# Patient Record
Sex: Female | Born: 1955 | Race: Black or African American | Hispanic: No | Smoking: Never smoker
Health system: Southern US, Community
[De-identification: ages and names within clinical notes are randomized; demographics above are authoritative.]

## PROBLEM LIST (undated history)

## (undated) VITALS — BP 146/89 | HR 96 | Temp 97.7°F | Resp 20 | Ht 62.0 in | Wt 172.0 lb

## (undated) DIAGNOSIS — K5730 Diverticulosis of large intestine without perforation or abscess without bleeding: Secondary | ICD-10-CM

## (undated) DIAGNOSIS — K589 Irritable bowel syndrome without diarrhea: Secondary | ICD-10-CM

## (undated) DIAGNOSIS — R51 Headache: Secondary | ICD-10-CM

## (undated) DIAGNOSIS — K317 Polyp of stomach and duodenum: Secondary | ICD-10-CM

## (undated) DIAGNOSIS — G20 Parkinson's disease: Secondary | ICD-10-CM

## (undated) DIAGNOSIS — M1990 Unspecified osteoarthritis, unspecified site: Secondary | ICD-10-CM

## (undated) DIAGNOSIS — I10 Essential (primary) hypertension: Secondary | ICD-10-CM

## (undated) DIAGNOSIS — E119 Type 2 diabetes mellitus without complications: Secondary | ICD-10-CM

## (undated) DIAGNOSIS — F419 Anxiety disorder, unspecified: Secondary | ICD-10-CM

## (undated) DIAGNOSIS — F32A Depression, unspecified: Secondary | ICD-10-CM

## (undated) DIAGNOSIS — K259 Gastric ulcer, unspecified as acute or chronic, without hemorrhage or perforation: Secondary | ICD-10-CM

## (undated) DIAGNOSIS — S065X9A Traumatic subdural hemorrhage with loss of consciousness of unspecified duration, initial encounter: Secondary | ICD-10-CM

## (undated) DIAGNOSIS — F410 Panic disorder [episodic paroxysmal anxiety] without agoraphobia: Secondary | ICD-10-CM

## (undated) DIAGNOSIS — K449 Diaphragmatic hernia without obstruction or gangrene: Secondary | ICD-10-CM

## (undated) DIAGNOSIS — G20A1 Parkinson's disease without dyskinesia, without mention of fluctuations: Secondary | ICD-10-CM

## (undated) DIAGNOSIS — G4733 Obstructive sleep apnea (adult) (pediatric): Secondary | ICD-10-CM

## (undated) DIAGNOSIS — F329 Major depressive disorder, single episode, unspecified: Secondary | ICD-10-CM

## (undated) DIAGNOSIS — R519 Headache, unspecified: Secondary | ICD-10-CM

## (undated) DIAGNOSIS — C20 Malignant neoplasm of rectum: Secondary | ICD-10-CM

## (undated) DIAGNOSIS — F319 Bipolar disorder, unspecified: Secondary | ICD-10-CM

## (undated) DIAGNOSIS — S065XAA Traumatic subdural hemorrhage with loss of consciousness status unknown, initial encounter: Secondary | ICD-10-CM

## (undated) DIAGNOSIS — L439 Lichen planus, unspecified: Secondary | ICD-10-CM

## (undated) DIAGNOSIS — D369 Benign neoplasm, unspecified site: Secondary | ICD-10-CM

## (undated) DIAGNOSIS — K649 Unspecified hemorrhoids: Secondary | ICD-10-CM

## (undated) DIAGNOSIS — K219 Gastro-esophageal reflux disease without esophagitis: Secondary | ICD-10-CM

## (undated) DIAGNOSIS — G8929 Other chronic pain: Secondary | ICD-10-CM

## (undated) HISTORY — DX: Essential (primary) hypertension: I10

## (undated) HISTORY — DX: Malignant neoplasm of rectum: C20

## (undated) HISTORY — DX: Gastro-esophageal reflux disease without esophagitis: K21.9

## (undated) HISTORY — DX: Panic disorder (episodic paroxysmal anxiety): F41.0

## (undated) HISTORY — DX: Major depressive disorder, single episode, unspecified: F32.9

## (undated) HISTORY — DX: Diaphragmatic hernia without obstruction or gangrene: K44.9

## (undated) HISTORY — DX: Obstructive sleep apnea (adult) (pediatric): G47.33

## (undated) HISTORY — DX: Headache: R51

## (undated) HISTORY — DX: Type 2 diabetes mellitus without complications: E11.9

## (undated) HISTORY — DX: Diverticulosis of large intestine without perforation or abscess without bleeding: K57.30

## (undated) HISTORY — PX: CHOLECYSTECTOMY: SHX55

## (undated) HISTORY — DX: Polyp of stomach and duodenum: K31.7

## (undated) HISTORY — DX: Headache, unspecified: R51.9

## (undated) HISTORY — DX: Anxiety disorder, unspecified: F41.9

## (undated) HISTORY — DX: Other chronic pain: G89.29

## (undated) HISTORY — DX: Parkinson's disease: G20

## (undated) HISTORY — DX: Parkinson's disease without dyskinesia, without mention of fluctuations: G20.A1

## (undated) HISTORY — PX: BACK SURGERY: SHX140

## (undated) HISTORY — DX: Benign neoplasm, unspecified site: D36.9

## (undated) HISTORY — DX: Unspecified hemorrhoids: K64.9

## (undated) HISTORY — DX: Irritable bowel syndrome, unspecified: K58.9

## (undated) HISTORY — DX: Depression, unspecified: F32.A

## (undated) HISTORY — DX: Lichen planus, unspecified: L43.9

## (undated) HISTORY — DX: Gastric ulcer, unspecified as acute or chronic, without hemorrhage or perforation: K25.9

## (undated) HISTORY — PX: ABDOMINAL HYSTERECTOMY: SHX81

## (undated) HISTORY — PX: APPENDECTOMY: SHX54

## (undated) HISTORY — DX: Bipolar disorder, unspecified: F31.9

---

## 1999-08-31 HISTORY — PX: SPINE SURGERY: SHX786

## 1999-12-23 ENCOUNTER — Encounter: Admission: RE | Admit: 1999-12-23 | Discharge: 1999-12-23 | Payer: Self-pay | Admitting: Emergency Medicine

## 1999-12-23 ENCOUNTER — Encounter: Payer: Self-pay | Admitting: Emergency Medicine

## 2000-01-08 ENCOUNTER — Encounter: Payer: Self-pay | Admitting: Neurosurgery

## 2000-01-12 ENCOUNTER — Encounter: Payer: Self-pay | Admitting: Neurosurgery

## 2000-01-12 ENCOUNTER — Encounter: Payer: Self-pay | Admitting: Orthopedic Surgery

## 2000-01-12 ENCOUNTER — Ambulatory Visit (HOSPITAL_COMMUNITY): Admission: RE | Admit: 2000-01-12 | Discharge: 2000-01-13 | Payer: Self-pay | Admitting: Neurosurgery

## 2000-02-08 ENCOUNTER — Ambulatory Visit (HOSPITAL_BASED_OUTPATIENT_CLINIC_OR_DEPARTMENT_OTHER): Admission: RE | Admit: 2000-02-08 | Discharge: 2000-02-08 | Payer: Self-pay | Admitting: Emergency Medicine

## 2000-02-08 ENCOUNTER — Encounter: Payer: Self-pay | Admitting: Pulmonary Disease

## 2000-08-13 ENCOUNTER — Encounter: Payer: Self-pay | Admitting: Pulmonary Disease

## 2000-08-13 ENCOUNTER — Ambulatory Visit (HOSPITAL_BASED_OUTPATIENT_CLINIC_OR_DEPARTMENT_OTHER): Admission: RE | Admit: 2000-08-13 | Discharge: 2000-08-13 | Payer: Self-pay | Admitting: Pulmonary Disease

## 2001-05-22 ENCOUNTER — Ambulatory Visit (HOSPITAL_COMMUNITY): Admission: RE | Admit: 2001-05-22 | Discharge: 2001-05-22 | Payer: Self-pay | Admitting: Internal Medicine

## 2001-05-22 ENCOUNTER — Encounter: Payer: Self-pay | Admitting: Internal Medicine

## 2001-08-30 DIAGNOSIS — C20 Malignant neoplasm of rectum: Secondary | ICD-10-CM

## 2001-08-30 HISTORY — PX: OTHER SURGICAL HISTORY: SHX169

## 2001-08-30 HISTORY — DX: Malignant neoplasm of rectum: C20

## 2002-04-19 ENCOUNTER — Other Ambulatory Visit: Admission: RE | Admit: 2002-04-19 | Discharge: 2002-04-19 | Payer: Self-pay | Admitting: Obstetrics & Gynecology

## 2002-05-30 HISTORY — PX: ESOPHAGOGASTRODUODENOSCOPY: SHX1529

## 2002-06-07 ENCOUNTER — Emergency Department (HOSPITAL_COMMUNITY): Admission: EM | Admit: 2002-06-07 | Discharge: 2002-06-07 | Payer: Self-pay | Admitting: Emergency Medicine

## 2002-06-13 ENCOUNTER — Emergency Department (HOSPITAL_COMMUNITY): Admission: EM | Admit: 2002-06-13 | Discharge: 2002-06-13 | Payer: Self-pay | Admitting: *Deleted

## 2002-06-25 ENCOUNTER — Ambulatory Visit (HOSPITAL_COMMUNITY): Admission: RE | Admit: 2002-06-25 | Discharge: 2002-06-25 | Payer: Self-pay | Admitting: Internal Medicine

## 2002-07-06 ENCOUNTER — Ambulatory Visit (HOSPITAL_COMMUNITY): Admission: RE | Admit: 2002-07-06 | Discharge: 2002-07-06 | Payer: Self-pay | Admitting: Surgery

## 2002-07-06 ENCOUNTER — Encounter: Payer: Self-pay | Admitting: Surgery

## 2002-07-13 ENCOUNTER — Encounter: Payer: Self-pay | Admitting: Surgery

## 2002-07-13 ENCOUNTER — Ambulatory Visit (HOSPITAL_COMMUNITY): Admission: RE | Admit: 2002-07-13 | Discharge: 2002-07-13 | Payer: Self-pay | Admitting: Surgery

## 2002-07-18 ENCOUNTER — Observation Stay (HOSPITAL_COMMUNITY): Admission: RE | Admit: 2002-07-18 | Discharge: 2002-07-19 | Payer: Self-pay | Admitting: Surgery

## 2002-07-18 ENCOUNTER — Encounter (INDEPENDENT_AMBULATORY_CARE_PROVIDER_SITE_OTHER): Payer: Self-pay | Admitting: Specialist

## 2002-08-07 ENCOUNTER — Ambulatory Visit: Admission: RE | Admit: 2002-08-07 | Discharge: 2002-08-20 | Payer: Self-pay | Admitting: Radiation Oncology

## 2002-08-21 ENCOUNTER — Encounter: Admission: RE | Admit: 2002-08-21 | Discharge: 2002-08-21 | Payer: Self-pay | Admitting: Oncology

## 2002-08-21 ENCOUNTER — Encounter: Payer: Self-pay | Admitting: Oncology

## 2002-10-02 ENCOUNTER — Ambulatory Visit (HOSPITAL_COMMUNITY): Admission: RE | Admit: 2002-10-02 | Discharge: 2002-10-02 | Payer: Self-pay | Admitting: Internal Medicine

## 2003-12-09 ENCOUNTER — Ambulatory Visit (HOSPITAL_COMMUNITY): Admission: RE | Admit: 2003-12-09 | Discharge: 2003-12-09 | Payer: Self-pay | Admitting: Internal Medicine

## 2004-03-19 ENCOUNTER — Ambulatory Visit (HOSPITAL_COMMUNITY): Admission: RE | Admit: 2004-03-19 | Discharge: 2004-03-19 | Payer: Self-pay | Admitting: Internal Medicine

## 2004-03-30 ENCOUNTER — Ambulatory Visit (HOSPITAL_COMMUNITY): Admission: RE | Admit: 2004-03-30 | Discharge: 2004-03-31 | Payer: Self-pay | Admitting: Neurosurgery

## 2004-04-13 ENCOUNTER — Encounter: Admission: RE | Admit: 2004-04-13 | Discharge: 2004-04-16 | Payer: Self-pay | Admitting: Neurosurgery

## 2004-07-16 ENCOUNTER — Encounter: Admission: RE | Admit: 2004-07-16 | Discharge: 2004-07-16 | Payer: Self-pay | Admitting: Neurosurgery

## 2004-11-26 ENCOUNTER — Ambulatory Visit: Payer: Self-pay | Admitting: Psychiatry

## 2005-05-04 ENCOUNTER — Ambulatory Visit: Payer: Self-pay | Admitting: Psychiatry

## 2005-08-25 ENCOUNTER — Ambulatory Visit (HOSPITAL_COMMUNITY): Admission: RE | Admit: 2005-08-25 | Discharge: 2005-08-25 | Payer: Self-pay | Admitting: Internal Medicine

## 2005-10-21 ENCOUNTER — Ambulatory Visit: Payer: Self-pay | Admitting: Internal Medicine

## 2005-10-27 ENCOUNTER — Ambulatory Visit (HOSPITAL_COMMUNITY): Admission: RE | Admit: 2005-10-27 | Discharge: 2005-10-27 | Payer: Self-pay | Admitting: Internal Medicine

## 2005-10-27 ENCOUNTER — Ambulatory Visit: Payer: Self-pay | Admitting: Internal Medicine

## 2005-10-27 HISTORY — PX: COLONOSCOPY: SHX174

## 2005-12-14 ENCOUNTER — Ambulatory Visit (HOSPITAL_COMMUNITY): Payer: Self-pay | Admitting: Psychiatry

## 2006-03-10 ENCOUNTER — Ambulatory Visit (HOSPITAL_COMMUNITY): Payer: Self-pay | Admitting: Psychiatry

## 2006-06-07 ENCOUNTER — Ambulatory Visit (HOSPITAL_COMMUNITY): Payer: Self-pay | Admitting: Psychiatry

## 2007-08-31 HISTORY — PX: COLONOSCOPY: SHX174

## 2007-09-20 ENCOUNTER — Ambulatory Visit: Payer: Self-pay | Admitting: Internal Medicine

## 2007-09-26 ENCOUNTER — Ambulatory Visit (HOSPITAL_COMMUNITY): Admission: RE | Admit: 2007-09-26 | Discharge: 2007-09-26 | Payer: Self-pay | Admitting: Internal Medicine

## 2007-09-26 ENCOUNTER — Ambulatory Visit: Payer: Self-pay | Admitting: Internal Medicine

## 2007-10-23 ENCOUNTER — Ambulatory Visit: Payer: Self-pay | Admitting: Pulmonary Disease

## 2007-10-23 DIAGNOSIS — R51 Headache: Secondary | ICD-10-CM | POA: Insufficient documentation

## 2007-10-23 DIAGNOSIS — R519 Headache, unspecified: Secondary | ICD-10-CM | POA: Insufficient documentation

## 2007-10-23 DIAGNOSIS — F410 Panic disorder [episodic paroxysmal anxiety] without agoraphobia: Secondary | ICD-10-CM | POA: Insufficient documentation

## 2007-10-23 DIAGNOSIS — J309 Allergic rhinitis, unspecified: Secondary | ICD-10-CM | POA: Insufficient documentation

## 2007-10-23 DIAGNOSIS — N183 Chronic kidney disease, stage 3 unspecified: Secondary | ICD-10-CM | POA: Insufficient documentation

## 2007-10-23 DIAGNOSIS — C20 Malignant neoplasm of rectum: Secondary | ICD-10-CM | POA: Insufficient documentation

## 2007-10-23 DIAGNOSIS — F329 Major depressive disorder, single episode, unspecified: Secondary | ICD-10-CM | POA: Insufficient documentation

## 2007-10-23 DIAGNOSIS — E785 Hyperlipidemia, unspecified: Secondary | ICD-10-CM | POA: Insufficient documentation

## 2007-10-23 DIAGNOSIS — F411 Generalized anxiety disorder: Secondary | ICD-10-CM | POA: Insufficient documentation

## 2007-10-23 DIAGNOSIS — G4733 Obstructive sleep apnea (adult) (pediatric): Secondary | ICD-10-CM | POA: Insufficient documentation

## 2007-10-23 DIAGNOSIS — K589 Irritable bowel syndrome without diarrhea: Secondary | ICD-10-CM | POA: Insufficient documentation

## 2007-10-23 DIAGNOSIS — E782 Mixed hyperlipidemia: Secondary | ICD-10-CM | POA: Insufficient documentation

## 2007-10-23 DIAGNOSIS — E119 Type 2 diabetes mellitus without complications: Secondary | ICD-10-CM | POA: Insufficient documentation

## 2007-10-23 DIAGNOSIS — I10 Essential (primary) hypertension: Secondary | ICD-10-CM | POA: Insufficient documentation

## 2007-11-29 ENCOUNTER — Encounter: Payer: Self-pay | Admitting: Pulmonary Disease

## 2007-12-01 ENCOUNTER — Telehealth (INDEPENDENT_AMBULATORY_CARE_PROVIDER_SITE_OTHER): Payer: Self-pay | Admitting: *Deleted

## 2007-12-12 ENCOUNTER — Telehealth (INDEPENDENT_AMBULATORY_CARE_PROVIDER_SITE_OTHER): Payer: Self-pay | Admitting: *Deleted

## 2010-06-26 LAB — COMPREHENSIVE METABOLIC PANEL
AST: 17 U/L
BUN: 13 mg/dL (ref 4–21)
Creat: 0.87
Potassium: 4.6 mmol/L
Sodium: 143 mmol/L (ref 137–147)

## 2010-06-26 LAB — HEMOGLOBIN A1C: Hgb A1c MFr Bld: 6.5 % — AB (ref 4.0–6.0)

## 2010-08-04 ENCOUNTER — Encounter: Payer: Self-pay | Admitting: Orthopedic Surgery

## 2010-08-05 ENCOUNTER — Ambulatory Visit: Payer: Self-pay | Admitting: Orthopedic Surgery

## 2010-08-05 DIAGNOSIS — M2140 Flat foot [pes planus] (acquired), unspecified foot: Secondary | ICD-10-CM | POA: Insufficient documentation

## 2010-08-05 DIAGNOSIS — IMO0002 Reserved for concepts with insufficient information to code with codable children: Secondary | ICD-10-CM | POA: Insufficient documentation

## 2010-08-05 DIAGNOSIS — M1710 Unilateral primary osteoarthritis, unspecified knee: Secondary | ICD-10-CM | POA: Insufficient documentation

## 2010-08-20 ENCOUNTER — Encounter (HOSPITAL_COMMUNITY)
Admission: RE | Admit: 2010-08-20 | Discharge: 2010-09-19 | Payer: Self-pay | Source: Home / Self Care | Attending: Orthopedic Surgery | Admitting: Orthopedic Surgery

## 2010-09-17 ENCOUNTER — Encounter: Payer: Self-pay | Admitting: Orthopedic Surgery

## 2010-09-21 ENCOUNTER — Encounter (HOSPITAL_COMMUNITY)
Admission: RE | Admit: 2010-09-21 | Discharge: 2010-09-29 | Payer: Self-pay | Source: Home / Self Care | Attending: Orthopedic Surgery | Admitting: Orthopedic Surgery

## 2010-09-29 NOTE — Letter (Signed)
Summary: History form  History form   Imported By: Jacklynn Ganong 08/07/2010 09:26:47  _____________________________________________________________________  External Attachment:    Type:   Image     Comment:   External Document

## 2010-09-29 NOTE — Assessment & Plan Note (Signed)
Summary: bi knee pain needs xr/bcbs/fagan/bsf   Vital Signs:  Patient profile:   55 year old female Height:      63 inches Weight:      207 pounds Pulse rate:   80 / minute Resp:     18 per minute  Vitals Entered By: Fuller Canada MD (August 05, 2010 9:19 AM)  Visit Type:  new patient Referring Provider:  Carylon Perches Primary Provider:  Carylon Perches  CC:  bilateral knee pain.Marland Kitchen  History of Present Illness: I saw Linda English in the office today for an initial visit.  She is a 55 years old woman with the complaint of:  bilateral knee pain. So, Has LEFT Ankle Pain.  Patient Has Bilateral Knee Pain, RIGHT Greater Than LEFT for Approximately 6 Years. It Appears to Be Worse When She Is Negotiating Stairs with Posterior Patellar Pain. She Also Complains of Stiffness Catching, and Weakness, Especially When Climbing Stairs. Swelling Has Not Been a Major Symptom.  LEFT Ankle Has Been Hurting for Approximately 5 Years.  Her Pain Is Unrelieved by Advil 400 Mg.  No injury.  Xrays today.  Meds: Prilosec, Amlodipine, Klonipin, Cymbalta, Buspar, Abilify, Folic acid, Metformin, Glipizide, Byetta, Methotrexate.      Allergies (verified): No Known Drug Allergies  Past History:  Past Surgical History: status post spine surgery x 2 back and neck/FYI L5, S1 hemilaminotomy and discectomy 2001 Dr. Elesa Hacker. status post cholecystectomy and hysterectomy status post colon surgery for rectal cancer 2 c sections Acid refux surgery  Family History: allergies: mother asthma: mother cancer: paternal grandfather (colon) Family History of Diabetes Family History Coronary Heart Disease female < 56  Social History: Patient never smoked.  pt is married. pt has children. Patient is divorced.  retired Runner, broadcasting/film/video no alcohol caffeine limited use daily masters degree  Review of Systems Constitutional:  Denies weight loss, weight gain, fever, chills, and fatigue. Cardiovascular:  Denies  chest pain, palpitations, fainting, and murmurs. Respiratory:  Complains of snoring; denies short of breath, wheezing, couch, tightness, pain on inspiration, and snoring . Gastrointestinal:  Complains of heartburn, nausea, diarrhea, and constipation; denies vomiting and blood in your stools. Genitourinary:  Complains of urgency; denies frequency, difficulty urinating, painful urination, flank pain, and bleeding in urine. Neurologic:  Complains of numbness; denies tingling, unsteady gait, dizziness, tremors, and seizure. Musculoskeletal:  Complains of joint pain; denies swelling, instability, stiffness, redness, heat, and muscle pain. Endocrine:  Complains of excessive thirst, exessive urination, and heat or cold intolerance. Psychiatric:  Complains of nervousness, depression, and anxiety; denies hallucinations. Skin:  Complains of changes in the skin and itching; denies poor healing, rash, and redness. HEENT:  Complains of blurred or double vision; denies eye pain, redness, and watering. Immunology:  Complains of seasonal allergies; denies sinus problems and allergic to bee stings. Hemoatologic:  Denies easy bleeding and brusing.  Physical Exam  Additional Exam:   * VS reviewed and were normal   *GEN: appearance was normal,endomorph body habitus.  ** CDV: normal pulses temperature and no edema  * LYMPH nodes were normal   * SKIN was normal   * Neuro: normal sensation ** Psyche: AAO x 3 and mood was normal   MSK *Gait was normal   LEFT ankle. Tenderness over the posterior tibial tendon with a flexible flatfoot.  Ankle range of motion is otherwise, normal and stable.  Bilateral knee examination reveals patellofemoral crepitus with range of motion, greater LEFT than RIGHT. Joint lines nontender. Range of motion full.  Strength and muscle tone normal. Knee stable.  Hip range of motion normal and unreactive.      Impression & Recommendations:  Problem # 1:  OSTEOARTHRITIS, KNEE  (ICD-715.96) Assessment New AP lateral, and patellofemoral x-rays both knees.  Bilateral patellofemoral arthritis is seen bilaterally.  There is also peaking of the tibial spines on both tibiofemoral x-rays.  Overall impression osteoarthritis, patellofemoral joint, mild tibiofemoral arthritis.  Recommendations are for arthritis medication. Physical therapy. Weight loss. Orthotics for the flexible flatfoot. Her updated medication list for this problem includes:     Orders: Physical Therapy Referral (PT) New Patient Level III (40981) Knees  x-ray bilateral ([id number])  Problem # 2:  PES PLANUS (ICD-734) Assessment: New  Orders: New Patient Level III (56213)  Patient Instructions: 1)  Please schedule a follow-up appointment in 6 months.   Orders Added: 1)  Physical Therapy Referral [PT] 2)  New Patient Level III [08657] 3)  Knees  x-ray bilateral [[id number]]

## 2010-09-30 ENCOUNTER — Ambulatory Visit (HOSPITAL_COMMUNITY): Payer: Self-pay

## 2010-10-02 ENCOUNTER — Encounter (HOSPITAL_COMMUNITY)
Admission: RE | Admit: 2010-10-02 | Discharge: 2010-10-02 | Disposition: A | Discharge status: Home / Self Care | Payer: BC Managed Care – PPO | Source: Ambulatory Visit | Attending: Orthopedic Surgery | Admitting: Orthopedic Surgery

## 2010-10-02 DIAGNOSIS — R262 Difficulty in walking, not elsewhere classified: Secondary | ICD-10-CM | POA: Insufficient documentation

## 2010-10-02 DIAGNOSIS — Z85038 Personal history of other malignant neoplasm of large intestine: Secondary | ICD-10-CM | POA: Insufficient documentation

## 2010-10-02 DIAGNOSIS — M6281 Muscle weakness (generalized): Secondary | ICD-10-CM | POA: Insufficient documentation

## 2010-10-02 DIAGNOSIS — M1710 Unilateral primary osteoarthritis, unspecified knee: Secondary | ICD-10-CM | POA: Insufficient documentation

## 2010-10-02 DIAGNOSIS — IMO0001 Reserved for inherently not codable concepts without codable children: Secondary | ICD-10-CM | POA: Insufficient documentation

## 2010-10-05 ENCOUNTER — Ambulatory Visit (HOSPITAL_COMMUNITY)
Admission: RE | Admit: 2010-10-05 | Discharge: 2010-10-05 | Disposition: A | Discharge status: Home / Self Care | Payer: BC Managed Care – PPO | Source: Ambulatory Visit | Attending: Orthopedic Surgery | Admitting: Orthopedic Surgery

## 2010-10-05 DIAGNOSIS — M25569 Pain in unspecified knee: Secondary | ICD-10-CM | POA: Insufficient documentation

## 2010-10-05 DIAGNOSIS — M6281 Muscle weakness (generalized): Secondary | ICD-10-CM | POA: Insufficient documentation

## 2010-10-05 DIAGNOSIS — I10 Essential (primary) hypertension: Secondary | ICD-10-CM | POA: Insufficient documentation

## 2010-10-05 DIAGNOSIS — R269 Unspecified abnormalities of gait and mobility: Secondary | ICD-10-CM | POA: Insufficient documentation

## 2010-10-05 DIAGNOSIS — R262 Difficulty in walking, not elsewhere classified: Secondary | ICD-10-CM | POA: Insufficient documentation

## 2010-10-05 DIAGNOSIS — IMO0001 Reserved for inherently not codable concepts without codable children: Secondary | ICD-10-CM | POA: Insufficient documentation

## 2010-10-05 DIAGNOSIS — E119 Type 2 diabetes mellitus without complications: Secondary | ICD-10-CM | POA: Insufficient documentation

## 2010-10-07 ENCOUNTER — Ambulatory Visit (HOSPITAL_COMMUNITY)
Admission: RE | Admit: 2010-10-07 | Discharge: 2010-10-07 | Disposition: A | Discharge status: Home / Self Care | Payer: BC Managed Care – PPO | Source: Ambulatory Visit | Attending: Orthopedic Surgery | Admitting: Orthopedic Surgery

## 2010-10-07 DIAGNOSIS — I10 Essential (primary) hypertension: Secondary | ICD-10-CM | POA: Insufficient documentation

## 2010-10-07 DIAGNOSIS — M25669 Stiffness of unspecified knee, not elsewhere classified: Secondary | ICD-10-CM | POA: Insufficient documentation

## 2010-10-07 DIAGNOSIS — IMO0001 Reserved for inherently not codable concepts without codable children: Secondary | ICD-10-CM | POA: Insufficient documentation

## 2010-10-07 DIAGNOSIS — M25569 Pain in unspecified knee: Secondary | ICD-10-CM | POA: Insufficient documentation

## 2010-10-07 DIAGNOSIS — E119 Type 2 diabetes mellitus without complications: Secondary | ICD-10-CM | POA: Insufficient documentation

## 2010-10-07 DIAGNOSIS — M6281 Muscle weakness (generalized): Secondary | ICD-10-CM | POA: Insufficient documentation

## 2010-10-07 DIAGNOSIS — R269 Unspecified abnormalities of gait and mobility: Secondary | ICD-10-CM | POA: Insufficient documentation

## 2010-10-07 NOTE — Miscellaneous (Signed)
Summary: PT progress note  PT progress note   Imported By: Jacklynn Ganong 09/28/2010 13:29:25  _____________________________________________________________________  External Attachment:    Type:   Image     Comment:   External Document

## 2010-10-08 ENCOUNTER — Encounter: Payer: Self-pay | Admitting: Orthopedic Surgery

## 2010-10-09 ENCOUNTER — Ambulatory Visit (HOSPITAL_COMMUNITY)
Admission: RE | Admit: 2010-10-09 | Discharge: 2010-10-09 | Disposition: A | Discharge status: Home / Self Care | Payer: BC Managed Care – PPO | Source: Ambulatory Visit | Attending: Orthopedic Surgery | Admitting: Orthopedic Surgery

## 2010-10-09 DIAGNOSIS — IMO0001 Reserved for inherently not codable concepts without codable children: Secondary | ICD-10-CM | POA: Insufficient documentation

## 2010-10-09 DIAGNOSIS — E119 Type 2 diabetes mellitus without complications: Secondary | ICD-10-CM | POA: Insufficient documentation

## 2010-10-09 DIAGNOSIS — M25669 Stiffness of unspecified knee, not elsewhere classified: Secondary | ICD-10-CM | POA: Insufficient documentation

## 2010-10-09 DIAGNOSIS — R262 Difficulty in walking, not elsewhere classified: Secondary | ICD-10-CM | POA: Insufficient documentation

## 2010-10-09 DIAGNOSIS — I10 Essential (primary) hypertension: Secondary | ICD-10-CM | POA: Insufficient documentation

## 2010-10-09 DIAGNOSIS — M25569 Pain in unspecified knee: Secondary | ICD-10-CM | POA: Insufficient documentation

## 2010-10-12 ENCOUNTER — Encounter (HOSPITAL_COMMUNITY)
Admission: RE | Admit: 2010-10-12 | Discharge: 2010-10-12 | Disposition: A | Discharge status: Home / Self Care | Payer: BC Managed Care – PPO | Source: Ambulatory Visit

## 2010-10-14 ENCOUNTER — Ambulatory Visit (HOSPITAL_COMMUNITY)
Admission: RE | Admit: 2010-10-14 | Discharge: 2010-10-14 | Disposition: A | Discharge status: Home / Self Care | Payer: BC Managed Care – PPO | Source: Ambulatory Visit | Attending: Orthopedic Surgery | Admitting: Orthopedic Surgery

## 2010-10-16 ENCOUNTER — Ambulatory Visit (HOSPITAL_COMMUNITY): Payer: BC Managed Care – PPO

## 2010-10-21 NOTE — Miscellaneous (Signed)
Summary: PT discharge summary  PT discharge summary   Imported By: Jacklynn Ganong 10/15/2010 14:23:19  _____________________________________________________________________  External Attachment:    Type:   Image     Comment:   External Document

## 2010-10-22 ENCOUNTER — Encounter (INDEPENDENT_AMBULATORY_CARE_PROVIDER_SITE_OTHER): Payer: Self-pay | Admitting: *Deleted

## 2010-10-27 NOTE — Letter (Signed)
Summary: Recall, Screening Colonoscopy Only  [facility]  [address]   [place], [place] [address]   Phone: [phone]  Fax: [phone]    October 22, 2010  [facility] [address] [place], [place]  [address] 02-19-1956   Dear Ms. Frommer,   Our records indicate it is time to schedule your colonoscopy.   Please call our office at [phone] and ask for the nurse.   Thank you, Stene Limes, LPN Cloria Spring, LPN  [facility] Ph: [phone]   Fax: [phone]

## 2010-11-27 ENCOUNTER — Encounter: Payer: Self-pay | Admitting: Gastroenterology

## 2010-11-30 ENCOUNTER — Encounter: Payer: Self-pay | Admitting: Gastroenterology

## 2010-11-30 ENCOUNTER — Ambulatory Visit (INDEPENDENT_AMBULATORY_CARE_PROVIDER_SITE_OTHER): Payer: BC Managed Care – PPO | Admitting: Gastroenterology

## 2010-11-30 VITALS — BP 134/90 | HR 90 | Temp 97.5°F | Ht 61.0 in | Wt 207.0 lb

## 2010-11-30 DIAGNOSIS — R194 Change in bowel habit: Secondary | ICD-10-CM

## 2010-11-30 DIAGNOSIS — R198 Other specified symptoms and signs involving the digestive system and abdomen: Secondary | ICD-10-CM | POA: Insufficient documentation

## 2010-11-30 DIAGNOSIS — Z800 Family history of malignant neoplasm of digestive organs: Secondary | ICD-10-CM | POA: Insufficient documentation

## 2010-11-30 DIAGNOSIS — C20 Malignant neoplasm of rectum: Secondary | ICD-10-CM

## 2010-11-30 NOTE — Assessment & Plan Note (Signed)
History of rectal cancer in 2004. Family history of colon cancer paternal grandfather. Last colonoscopy 3 years ago.

## 2010-11-30 NOTE — Progress Notes (Signed)
Primary Care Physician:  Cranford Mon  Chief Complaint  Patient presents with  . Pre-op Exam    colonoscopy    HPI:  Linda English is a 55 y.o. female here for consideration of colonoscopy. Her last colonoscopy was in January 2009. She has a history of low anterior resection for rectal carcinoma back in 2004. At time of her last colonoscopy she had a surgical anastomosis at 3 cm, very distal residual rectum was normal. Distal scattered diverticula, friable anal canal hemorrhoids. She basically has been having surveillance colonoscopies every 3 years at this point. She states in the last few months she has had a change her bowel habits. Several months started having increased constipation. H/O IBS but before more diarrhea. Recently started to have to take laxatives. MOM. No known rectal bleeding or melena. No new abd pain, some LLQ tenderness. No weight loss, anorexia. Heartburn well-controlled, no dysphagia.   Current Outpatient Prescriptions  Medication Sig Dispense Refill  . ABILIFY 2 MG tablet Take 1 tablet by mouth daily.      Marland Kitchen amLODipine-benazepril (LOTREL) 5-20 MG per capsule Take 1 capsule by mouth daily.        Marland Kitchen buPROPion (WELLBUTRIN XL) 150 MG 24 hr tablet Take 1 tablet by mouth daily.      Marland Kitchen BYETTA 10 MCG PEN 10 MCG/0.04ML SOLN Inject 10 mcg into the skin 2 (two) times daily.       . clonazePAM (KLONOPIN) 1 MG tablet Take 1 tablet by mouth Every 6 hours as needed.      . CYMBALTA 60 MG capsule Take 1 tablet by mouth daily.      . folic acid (FOLVITE) 1 MG tablet Take 1 tablet by mouth daily.      Marland Kitchen glipiZIDE (GLUCOTROL) 10 MG 24 hr tablet Take 1 tablet by mouth daily.      . metFORMIN (GLUCOPHAGE-XR) 500 MG 24 hr tablet Take 1,000 mg by mouth daily.       . methotrexate (RHEUMATREX) 2.5 MG tablet Take 3 tablets by mouth Once a week. Wednesday      . mirtazapine (REMERON) 15 MG tablet Take 1 tablet by mouth daily.      . ONE TOUCH ULTRA TEST test strip 1 strip by Does not  apply route Once daily as needed.      Marland Kitchen PRILOSEC OTC 20 MG tablet Take 1 tablet by mouth daily.      .         .        . aspirin 81 MG tablet Take 81 mg by mouth daily.        Marland Kitchen Hyoscyamine Sulfate (HYOMAX-DT) 0.375 MG TBCR Take by mouth. As directed       .         Marland Kitchen          Allergies as of 11/30/2010  . (No Known Allergies)    Past Medical History  Diagnosis Date  . Anxiety   . Allergic rhinitis   . Acid reflux   . IBS (irritable bowel syndrome)   . Depression   . Panic disorder   . Chronic headache   . Rectal cancer   . DM (diabetes mellitus)   . Hypertension   . Obstructive sleep apnea   . Lichen planus     Past Surgical History  Procedure Date  . Spine surgery 2001    L5,S1 HEMILAMINOTOMY AND DISCECTOMY/DR DEATON  . Cholecystectomy   . Low anterior  rection 2004    RECTAL CANCER  . Cesarean section     X  2  . Appendectomy   . Colonoscopy 08/2007    DR Jena Gauss, friable anal canal hemorrhoids, surgical anastomosis at 3cm, distal scattered tics  . Esophagogastroduodenoscopy 05/2002    DR Jena Gauss    Family History  Problem Relation Age of Onset  . Colon cancer Paternal Grandfather 29  . Colon polyps Brother   . Colon polyps Cousin     History   Social History  . Marital Status: Divorced    Spouse Name: N/A    Number of Children: N/A  . Years of Education: N/A   Occupational History  . teacher     [employer]   Social History Main Topics  . Smoking status: Never Smoker   . Smokeless tobacco: Never Used  . Alcohol Use: No  . Drug Use: No  . Sexually Active: No   Other Topics Concern  . Not on file   Social History Narrative  . No narrative on file      ROS:  General: Negative for anorexia, weight loss, fever, chills, fatigue, weakness. Eyes: Negative for vision changes.  ENT: Negative for hoarseness, difficulty swallowing , nasal congestion. CV: Negative for chest pain, angina, palpitations, dyspnea on exertion, peripheral edema.    Respiratory: Negative for dyspnea at rest, dyspnea on exertion, cough, sputum, wheezing.  GI: See history of present illness. GU:  Negative for dysuria, hematuria, urinary incontinence, urinary frequency, nocturnal urination.  MS: Negative for joint pain, low back pain.  Derm: Negative for rash or itching.  Neuro: Negative for weakness, abnormal sensation, seizure, frequent headaches, memory loss, confusion.  Psych: Positive for anxiety. No depression, suicidal ideation, hallucinations.  Endo: Negative for unusual weight change.  Heme: Negative for bruising or bleeding. Allergy: Negative for rash or hives.    Physical Examination:  BP 134/90  Pulse 90  Temp 97.5 F (36.4 C)  Ht 5\' 1"  (1.549 m)  Wt 207 lb (93.895 kg)  BMI 39.11 kg/m2  SpO2 99%   General: Well-nourished, well-developed in no acute distress.  Head: Normocephalic, atraumatic.   Eyes: Conjunctiva pink, no icterus. Mouth: Oropharyngeal mucosa moist and pink , no lesions erythema or exudate. Neck: Supple without thyromegaly, masses, or lymphadenopathy.  Lungs: Clear to auscultation bilaterally.  Heart: Regular rate and rhythm, no murmurs rubs or gallops.  Abdomen: Bowel sounds are normal, nontender, nondistended, no hepatosplenomegaly or masses, no abdominal bruits or    hernia , no rebound or guarding.   Extremities: No lower extremity edema.  Neuro: Alert and oriented x 4 , grossly normal neurologically.  Skin: Warm and dry, no rash or jaundice.   Psych: Alert and cooperative, normal mood and affect.

## 2010-11-30 NOTE — Assessment & Plan Note (Signed)
See below

## 2010-11-30 NOTE — Assessment & Plan Note (Signed)
Several month history of change in bowel habits. Chronic irritable bowel syndrome with predominance of diarrhea. Now with more constipation requiring laxative use. Given her history of prior rectal cancer and change in bowels we'll go ahead and perform colonoscopy at this time. I have discussed the risks, alternatives, benefits with regards to but not limited to the risk of reaction to medication, bleeding, infection, perforation and the patient is agreeable to proceed. Written consent to be obtained.

## 2010-12-01 NOTE — Progress Notes (Signed)
Reviewed by R. Michael Jaivian Battaglini, MD FACP FACG 

## 2010-12-15 ENCOUNTER — Encounter: Payer: BC Managed Care – PPO | Admitting: Internal Medicine

## 2010-12-15 ENCOUNTER — Ambulatory Visit (HOSPITAL_COMMUNITY)
Admission: RE | Admit: 2010-12-15 | Discharge: 2010-12-15 | Disposition: A | Discharge status: Home / Self Care | Payer: BC Managed Care – PPO | Source: Ambulatory Visit | Attending: Internal Medicine | Admitting: Internal Medicine

## 2010-12-15 ENCOUNTER — Other Ambulatory Visit: Payer: Self-pay | Admitting: Internal Medicine

## 2010-12-15 DIAGNOSIS — K5909 Other constipation: Secondary | ICD-10-CM | POA: Insufficient documentation

## 2010-12-15 DIAGNOSIS — K5730 Diverticulosis of large intestine without perforation or abscess without bleeding: Secondary | ICD-10-CM

## 2010-12-15 DIAGNOSIS — K648 Other hemorrhoids: Secondary | ICD-10-CM | POA: Insufficient documentation

## 2010-12-15 DIAGNOSIS — Z85038 Personal history of other malignant neoplasm of large intestine: Secondary | ICD-10-CM | POA: Insufficient documentation

## 2010-12-15 DIAGNOSIS — D126 Benign neoplasm of colon, unspecified: Secondary | ICD-10-CM | POA: Insufficient documentation

## 2010-12-15 DIAGNOSIS — K5900 Constipation, unspecified: Secondary | ICD-10-CM

## 2010-12-15 DIAGNOSIS — Z9049 Acquired absence of other specified parts of digestive tract: Secondary | ICD-10-CM | POA: Insufficient documentation

## 2010-12-15 HISTORY — PX: COLONOSCOPY: SHX174

## 2010-12-20 NOTE — Op Note (Signed)
  NAME:  Linda English, Linda English        ACCOUNT NO.:  [id number]  MEDICAL RECORD NO.:  [id number]           PATIENT TYPE:  O  LOCATION:  DAYP                          FACILITY:  [facility]  PHYSICIAN:  R. Roetta Sessions, M.D. DATE OF BIRTH:  1955/10/06  DATE OF PROCEDURE:  12/15/2010 DATE OF DISCHARGE:                              OPERATIVE REPORT   PROCEDURE:  Colonoscopy with biopsy.  INDICATIONS FOR PROCEDURE:  A 55 year old lady with a history of low anterior resection for colorectal cancer, somewhat distantly now with some change in bowel habits consistent with constipation.  Last colonoscopy was in 2009, surgical anastomosis low at 3 cm.  She had some distal diverticula was friable hemorrhoids.  Colonoscopy is now being done.  Risks, benefits, limitations, alternatives, imponderables have been discussed, questions answered.  Please see the documentation in the medical record.  PROCEDURE NOTE:  O2 saturation, blood pressure, pulse, respirations monitored throughout the entire procedure.  CONSCIOUS SEDATION:  Versed 4 mg IV, Demerol 100 mg IV in divided doses.  INSTRUMENT:  Pentax video chip system.  FINDINGS:  Digital rectal exam revealed no abnormalities.  Endoscopic findings:  Prep was suboptimal, but doable.  The anastomosis with the distal rectum was inapparent, it was very good healing.  The residual rectum and distal colon appeared normal where the anastomosis was noted to have been located previously aside from anal papilla and internal hemorrhoids seen on retroflexion.  There were distal colonic diverticula.  It tapered off more proximally.  The scope was easily advanced to the cecum. Cecum, ileocecal valve, appendiceal orifice were well seen photographed for the record.  From this level, scope slowly and cautiously withdrawn.  All previously mentioned mucosal surfaces were again seen.  The patient has single diminutive polyp at the base of cecum which was cold  biopsied/removed and the distal diverticula as outlined above.  The remainder of the residual colonic mucosa and rectum appeared normal, aside from hemorrhoids and anal papilla.  The patient tolerated the procedure well.  Cecal withdrawal time 14 minutes.  IMPRESSION:  Anal papilla and internal hemorrhoids.  Residual rectum and colon felt to been seen adequately in the distal colonic diverticula, diminutive polyp in the base of the cecum status post cold biopsy removal.  RECOMMENDATIONS: 1. Diverticulosis, constipation literature provided to Ms. Cooksey. 2. Daily fiber supplement such as Benefiber 1 tablespoon. 3. MiraLax 17 g orally at bedtime p.r.n. constipation. 4. Follow up on path. 5. Further recommendations to follow.     Jonathon Bellows, M.D.     RMR/MEDQ  D:  12/15/2010  T:  12/15/2010  Job:  [id number]  cc:   Kingsley Callander. Ouida Sills, MD Fax: [phone]  Electronically Signed by Lorrin Goodell M.D. on 12/20/2010 02:08:05 PM

## 2010-12-24 ENCOUNTER — Encounter: Payer: Self-pay | Admitting: Internal Medicine

## 2011-01-12 NOTE — Op Note (Signed)
NAME:  Linda English, Linda English        ACCOUNT NO.:  [id number]   MEDICAL RECORD NO.:  [id number]          PATIENT TYPE:  AMB   LOCATION:  DAY                           FACILITY:  [facility]   PHYSICIAN:  R. Roetta Sessions, M.D. DATE OF BIRTH:  [birth year]   DATE OF PROCEDURE:  09/26/2007  DATE OF DISCHARGE:                               OPERATIVE REPORT   PROCEDURE:  Diagnostic colonoscopy.   INDICATIONS FOR PROCEDURE:  55 year old lady intermittent blood per  rectum, status post low anterior resection for rectal cancer back in  2004.  Has had negative follow-up exam February 2007 for similar  symptoms.  She says at times her stools are somewhat dark in color but  has really been low-volume gross blood more less per her report.  She  had no blood per rectum during the prep period.  Colonoscopy is now  being done.  This approach has discussed the patient at length.  Potential risks, benefits and alternatives have been reviewed, questions  answered.  She is agreeable. Please see documentation in the medical  record.   PROCEDURE NOTE:  O2 saturation, blood pressure, pulse respirations were  monitored the entire procedure.   CONSCIOUS SEDATION:  Versed 3 mg IV, Demerol 75 mg IV in divided doses.   INSTRUMENT:  Pentax video chip system.   FINDINGS:  Digital rectal exam revealed no abnormalities.   ENDOSCOPIC FINDINGS:  The prep was good.  Rectum:  Examination of the rectal mucosa, surgical anastomosis was  identified a 3 cm in from the anal verge.  The patient had some friable  anal canal hemorrhoids, otherwise the distal rectum anastomosis appeared  generally normal.  The lumen was too small to attempt a retroflexion  safely.  Examination of the residual colon all way to the cecum was  undertaken.  The patient had some distal residual diverticula.  Remainder colonic mucosa to cecum appeared normal.  Cecum, ileocecal  valve, appendiceal orifice well seen, photographed for the record.  From  this level scope was slowly cautiously withdrawn.  All previous  mentioned mucosal surfaces were again seen.  No other abnormalities were  observed.  The patient tolerated the procedure well was reacted in  endoscopy.   IMPRESSION:  Friable anal canal hemorrhoids.  Surgical anastomosis at 3  cm, very distal residual rectum appeared normal.  Distal scattered  diverticula, remainder colonic mucosa appeared normal.   RECOMMENDATIONS:  1. Diverticulosis hemorrhoid literature provided Ms. Tapia.  Daily      fiber supplement, perhaps trying Benefiber 1 tablespoon daily.      Anusol-HC suppositories one per rectum bedtime nightly for 10      nights.  2. She is to let us know if she has any further symptoms.  At this      point I do not feel evaluation of her upper GI tract or any further      GI evaluation is warranted this time.   I do, however, recommend she have a repeat colonoscopy in five years.      Jonathon Bellows, M.D.  Electronically Signed     RMR/MEDQ  D:  09/26/2007  T:  09/26/2007  Job:  [id number]   cc:   Dr. Selena Batten, [facility] O. Ouida Sills, MD  Fax: [phone]

## 2011-01-12 NOTE — H&P (Signed)
NAME:  Linda English, Linda English        ACCOUNT NO.:  [id number]   MEDICAL RECORD NO.:  [id number]          PATIENT TYPE:  AMB   LOCATION:  DAY                           FACILITY:  [facility]   PHYSICIAN:  R. Roetta Sessions, M.D. DATE OF BIRTH:  August 11, 1956   DATE OF ADMISSION:  09/20/2007  DATE OF DISCHARGE:  LH                              HISTORY & PHYSICAL   CHIEF COMPLAINT:  Constipation blood in stool.   HISTORY OF PRESENT ILLNESS:  Linda English is a 55 year old African  American female who underwent a transanal resection of rectal carcinoma  back in 2003.  Margins were not clear and she subsequently underwent a  low anterior resection in 2004, done at [facility].  She was felt to have  limited stage disease and did not require adjuvant chemotherapy or  radiation therapy.  She had done very well.  We last saw her when she  presented with intermittent pain with hematochezia in February 2007.  She had another colonoscopy which revealed anal canal hemorrhoids and a  surgical anastomosis at 3 cm from the anal verge which appear to be  normal.  She had a few scattered distal diverticula.  It was felt that  she bled from hemorrhoids in the setting of IBS.  [name]  today with complaints of altered bowel functions over the last couple of  months.  She tends to have chronic diarrhea and having some where  between 5 and 10 stools daily.  However, prior to about 6 weeks ago she  started having constipation.  When this occurred and she stopped taking  her Imodium, she would take her metformin more regularly because it  tends to make her stools loose.  She often withholds medication because  of loose stools.  At times she would have to digitally disimpact herself  due to severe constipation.  When she would do this, she would see fresh  blood per rectum.  Other time she would pass very black soft stools with  maroon-colored blood.  She states her stools are returning more to what  she is used  to, which is a liquid, mushy stool several times a day.  She  does complain of intermittent lower abdominal cramps which is chronic  for her.  She denies any nausea or vomiting.  She denies any heartburn  as long as she takes her Nexium.  She denies any weight loss.  No  dysphagia or odynophagia.   CURRENT MEDICATIONS:  1. Metformin XR 1 gram b.i.d.  2. Byetta 5 units b.i.d.  3. Norvasc/benazepril 5/20 mg daily.  4. Nexium 40 mg daily.  5. Paroxetine 50 mg daily.  6. Imodium p.r.n.   ALLERGIES:  EXCEDRIN and CAFFEINE.   PAST MEDICAL HISTORY:  Diabetes mellitus, hypertension, GERD, anxiety,  depression, panic attacks, sleep apnea.  History of rectal cancer as  outlined above.  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 For invasive  procedures, however, she states now that she has been cleared and does  not need antibiotics.   PAST SURGICAL HISTORY:  She has also had  a cesarean section,  hysterectomy, cholecystectomy, appendectomy, back surgery, neck surgery  and surgery for rectal carcinoma as above.   FAMILY HISTORY:  Paternal grandfather had colorectal cancer.  She has  had a brother with multiple polyps and a first cousin had multiple  colonic polyps.   SOCIAL HISTORY:  She is married.  She is a Electrical engineer at [employer].  She has never been a smoker.  No alcohol use.   REVIEW OF SYSTEMS:  GI:  See HPI.  CARDIOPULMONARY:  No chest pain,  shortness of breath, palpitations. GENITOURINARY: No dysuria, hematuria.   PHYSICAL EXAMINATION:  VITAL SIGNS:  Weight 196, height 5 feet 2,  temperature 98.6, blood pressure 130/90, pulse 72.  GENERAL:  Pleasant, well-nourished, well-developed African American  female in no acute distress.  SKIN:  Warm and dry.  No jaundice.  HEENT: Sclerae nonicteric.  Oropharyngeal mucosa moist and pink.  CHEST:  Lungs are clear to auscultation.  CARDIAC EXAM:   Reveals regular rate and rhythm.  Normal S1, S2. No  murmurs, rubs or gallops.  ABDOMEN: Positive bowel sounds, obese but symmetrical.  She has well-  healed surgical scar.  Abdomen is soft.  No appreciable masses or  organomegaly.  RECTAL EXAM:  Reveals good sphincter tone.  No obvious mass in the  rectal vault.  Secretions are Hemoccult negative.   IMPRESSION:  Linda English is a 55 year old lady with history of rectal  carcinoma ultimately requiring low anterior resection back in 2004.  Her  last colonoscopy was in February 2007, as outlined above.  [name]  now with a change in bowel movements, more constipation.  She is also  passing some maroon-colored  stools as well as black stools.  Cannot  exclude the possibility of bleeding from more proximal GI tract, either  from the small bowel or proximal colon or even stomach.  I have  discussed the case with Dr. Jena Gauss.   PLAN:  1. Ileo-colonoscopy with possible EGD based on findings.  2. Trial of HyoMax 0.375 mg daily, #33 refills.  3. Further recommendations to follow.      Tana Coast, P.AJonathon Bellows, M.D.  Electronically Signed   LL/MEDQ  D:  09/20/2007  T:  09/21/2007  Job:  [id number]   cc:   Kingsley Callander. Ouida Sills, MD  Fax: [phone]   Dr. Bethann Berkshire. [facility]

## 2011-01-15 NOTE — Op Note (Signed)
NAME:  Linda English, Linda English                  ACCOUNT NO.:  [id number]   MEDICAL RECORD NO.:  [id number]                   PATIENT TYPE:  AMB   LOCATION:  DAY                                  FACILITY:  [facility]   PHYSICIAN:  R. Roetta Sessions, M.D.              DATE OF BIRTH:  [birth year]   DATE OF PROCEDURE:  12/09/2003  DATE OF DISCHARGE:                                 OPERATIVE REPORT   PROCEDURE:  Surveillance colonoscopy.   ENDOSCOPIST:  Gerrit Friends. Rourk, M.D.   INDICATIONS FOR PROCEDURE:  The patient is a 55 year old lady who was found  to have an invasive rectal cancer in a polyp at 10 cm back in October 2003.  She had a transanal resection done in [place]; margins were not clear.  Ultimately she underwent a low anterior resection by Dr. Selena Batten in [place].  She did not require adjuvant chemotherapy, although the exact stage is  unknown to me. She has done well.  She is here for surveillance.  This  approach has been discussed with the patient at length.  The potential  risks, benefits, and alternatives have been reviewed; questions answered.  She is agreeable.  Please see my handwritten H&P.   PROCEDURE NOTE:  O2 saturation, blood pressure, pulse and respirations were  monitored throughout the entirety of the procedure.  Conscious sedation:  Demerol 75 mg IV, Versed 4 mg IV in divided doses.  SB prophylaxis:  Ampicillin 2 gm IV, gentamicin 100 mg IV prior to the procedure.   INSTRUMENT:  Olympus CF-100L colonoscope.   FINDINGS:  A digital exam revealed no abnormalities.   ENDOSCOPIC FINDINGS:  The prep was good.   RECTUM:  Examination of the rectal mucosa revealed a surgical anastomosis,  low, down at 3 cm.  I was unable to retroflex; however, I got a good en face  view of the residual rectum and anastomosis which appeared normal.   COLON:  The colonic mucosa was surveyed from the anastomosis through the  remainder of the colon all the way to the cecum.  The cecum,  ileocecal  valve, and appendiceal orifice were well seen and photographed for  the  record.   From this level the scope was slowly withdrawn.  All previously mentioned  mucosal surfaces were again seen.  The patient was noted to have scattered  left-sided diverticula.  The remainder of the colonic mucosa appeared  normal.  The patient tolerated the procedure well and was reacted in  endoscopy.   IMPRESSION:  1. Status post left hemicolectomy with surgical anastomosis identified at 3     cm in from the anal verge.  2. Residual rectal mucosa and colonic mucosa appeared normal aside from left-     sided diverticula.   RECOMMENDATIONS:  1. Repeat colonoscopy in 3 years.  2. Diverticulosis literature provided to [name].      ___________________________________________  Jonathon Bellows, M.D.   RMR/MEDQ  D:  12/09/2003  T:  12/09/2003  Job:  [id number]   cc:   Kingsley Callander. Ouida Sills, M.D.  [address]  [place]  [place] [address]  Fax: [phone]   Dr. Selena Batten (Surgeon)  [facility] [place] [facility]  [place]

## 2011-01-15 NOTE — Consult Note (Signed)
NAME:  Linda English, Linda English                    ACCOUNT NO.:  [id number]   MEDICAL RECORD NO.:  [id number]                  PATIENT TYPE:   LOCATION:                                       FACILITY:   PHYSICIAN:  R. Roetta Sessions, M.D.              DATE OF BIRTH:  07/16/1937   DATE OF CONSULTATION:  06/18/2002  DATE OF DISCHARGE:                                   CONSULTATION   REASON FOR CONSULTATION:  Blood in stool.   HISTORY OF PRESENT ILLNESS:  The patient is a pleasant 55 year old black  female, patient of Dr. Aldona Bar, who presents today for further evaluation of  blood in her stool.  She recently collected three Hemoccult cards for him  and was told that these were positive.  It is unsure how many of the three  were positive.  The patient really has not seen any bright red blood per  rectum.  At times she has wondered if there have been specks of blood in her  stool.  She does report that her stools are very dark at times.  She has  some occasional abdominal griping-type pain.  She also has some intermittent  epigastric pain as well as some heartburn.  She generally has symptoms at  least once weekly.  She takes Tums for this.  Her bowels move sporadically.  She will have anywhere from diarrhea going up to one week without a bowel  movement.  At times she uses enemas or Epsom salts.  She states she has more  diarrhea than constipation, however.  She has been having these symptoms  ever since her cholecystectomy over 15 years ago.  She has noted lately that  her heart rate has been up.  She also has had some problems with dizzy  spells and has had hypertension related with this.  Denies any dysphagia,  odynophagia, nausea, or vomiting.   CURRENT MEDICATIONS:  1. Paxil 25 mg q.d.  2. Xanax 0.5 mg p.r.n.  3. Klonopin 0.5 mg q.h.s. p.r.n.  4. Allegra 180 mg q.d. p.r.n.  5. Aleve p.r.n. headache.  6. Ibuprofen p.r.n. headache.   ALLERGIES:  EXCEDRIN causes nervousness.   PAST MEDICAL HISTORY:  1. Depression.  2. Headaches.  3. Allergies.   PAST SURGICAL HISTORY:  1. Cesarean section x 2.  2. Hysterectomy.  3. Cholecystectomy 16 years ago.  4. Back surgery.   FAMILY HISTORY:  No family history of colorectal cancer or chronic GI  illness.   SOCIAL HISTORY:  She has been married for 23 years.  She has two biological  children and one adopted daughter.  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  She has never been a smoker.  Denies any alcohol use.   REVIEW OF SYSTEMS:  Please see HPI for GI.  GENERAL:  Denies any weight  loss.  CARDIOPULMONARY:  Denies any chest pain or shortness of breath.   PHYSICAL EXAMINATION:  VITAL SIGNS:  Weight 212, height 5 feet 2 inches.  Temperature 97.8, blood pressure 130/82, pulse 114, on recheck was 112.  GENERAL:  Very pleasant, well-nourished, well-developed black female in no  acute distress.  SKIN:  Warm and dry.  No jaundice.  HEENT:  Conjunctivae are pink.  Sclerae are nonicteric.  Pupils are equal,  round, and reactive to light.  Oropharyngeal mucosa moist and pink.  No  lesions, erythema, or exudate.  No lymphadenopathy, thyromegaly, or carotid  bruits.  CHEST:  Lungs clear to auscultation.  CARDIAC:  Slight tachycardia.  Regular rate of 114.  No murmurs, rubs, or  gallops.  ABDOMEN:  Positive bowel sounds.  Obese but symmetrical.  Soft and  nontender.  No organomegaly or masses.  EXTREMITIES:  No edema.   IMPRESSION:  The patient is a pleasant 55 year old lady who was recently  found to have Hemoccult-positive stool on mail-in Hemoccult cards.  This is  in the setting of no known melena or rectal bleeding.  She has several vague  gastrointestinal complaints including intermittent heartburn and epigastric  pain which may be secondary to gastroesophageal reflux disease, alternating  bowel habits with constipation and diarrhea.  She could be bleeding anywhere  along the gastrointestinal tract  from either esophagitis, gastritis, peptic  ulcer disease, atriovenous malformations, polyps, hemorrhoids, or even  cancer.  We discussed today the approach of doing an upper endoscopy and  colonoscopy given her multiple gastrointestinal complaints.  We discussed  risks, alternatives, and benefits, and she is agreeable to proceed with both  of these procedures.  She also has some new tachycardia symptoms, regular  rhythm.  Will check a CBC, TSH, free T4 to check for anemia or thyroid  disease.   PLAN:  1. EGD and colonoscopy in the near future.  2.     TSH, free T4, and CBC.  3. If the patient develops any weakness, dizziness, or chest pain, she will     go to the ER and notify M.D.   I would like to thank Dr. Aldona Bar for allowing [place] to take part in the care of  this patient.     Tana Coast, Pricilla Larsson, M.D.    LL/MEDQ  D:  06/18/2002  T:  06/19/2002  Job:  [id number]   cc:   Gerrit Friends. Aldona Bar, M.D.  [address]  [place]  [place] [address]  Fax: [phone]   R. Roetta Sessions, M.D.  [address]  [place]  [place] [address]  Fax: [phone]

## 2011-01-15 NOTE — H&P (Signed)
NAME:  Linda English, Linda English        ACCOUNT NO.:  [id number]   MEDICAL RECORD NO.:  [id number]          PATIENT TYPE:  AMB   LOCATION:  DAY                           FACILITY:  [facility]   PHYSICIAN:  R. Roetta Sessions, M.D. DATE OF BIRTH:  [birth year]   DATE OF ADMISSION:  DATE OF DISCHARGE:  LH                                HISTORY & PHYSICAL   CHIEF COMPLAINT:  Recent rectal bleeding.   HISTORY OF PRESENT ILLNESS:  Linda English is a pleasant 55-year-  old African-American female who underwent a transanal resection of a rectal  carcinoma back in 2003.  Margins were not clear.  She subsequently underwent  a low anterior resection in 2004 done at [facility].  She was felt to have limited-  stage disease and did not require adjuvant chemotherapy or radiation  treatment.  She has done very well.  She underwent surveillance colonoscopy  on December 09, 2003.  Surgical anastomosis was at 3 cm.  The residual  colorectal mucosa appeared normal aside from left sided diverticula. It was  recommended she come back in three years for colonoscopy.  She tells me over  the past several weeks she has noted some bloody mucous-like stools on  occasion. She has an element of irritable bowel syndrome and has had  worsening of the symptoms with intermittent diarrhea recently; she has not  had any associated abdominal pain.  She is scheduled to see Dr. Selena Batten her  Oncologist, back at [facility] next week.  She does not have any upper  gastrointestinal tract symptoms such as odynophagia, dysphagia, early  satiety, nausea or vomiting.  She does have gastroesophageal reflux disease  with symptoms well controlled with Nexium 40 mg orally daily.   PAST MEDICAL HISTORY:  Significant for type 2 diabetes mellitus, panic  disorder, hypertension, sleep apnea.  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   PAST SURGICAL HISTORY:  Includes  Cesarean section, hysterectomy,  cholecystectomy, appendectomy, back surgery, neck surgery, low anterior  resection as described above.   CURRENT MEDICATIONS:  1.  Paxil 50 mg daily.  2.  Xanax 0.5 mg PRN.  3.  Klonopin 0.5 mg q.h.s.  4.  Ibuprofen PRN.  5.  Metformin 500 mg p.o. b.i.d.  6.  Chlorthalidone 25 mg daily.  7.  Nexium 40 mg daily.   ALLERGIES:  EXCEDRIN, CAFFEINE.   FAMILY HISTORY:  Father died at age 63 with heart attack.  Mother is alive  at age 45 with asthma and hypertension.  No history of chronic  gastrointestinal or liver illness.   SOCIAL HISTORY:  The patient is married.  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.  No tobacco, no alcohol, no illicit drugs.   REVIEW OF SYSTEMS:  No recent chest pain, dyspnea on exertion.   PHYSICAL EXAMINATION:  GENERAL APPEARANCE:  Reveals a pleasant 55 year old  lady resting comfortably.  VITAL SIGNS:  Weight 207.5, height 5 feet 2 inches, temperature 98.4, blood  pressure 130/80, pulse 96.  SKIN:  Warm and dry.  CHEST:  Lungs  are clear to auscultation.  HEART:  Has regular rate and rhythm without murmurs, rubs or gallops.  BREAST:  Examination is deferred.  ABDOMEN:  Obese, well healed surgical scars, positive bowel sounds, soft,  nontender.  No appreciable masses or organomegaly.  EXTREMITIES:  No edema.  RECTAL:  Examination reveals good sphincter tone.  No obvious mass in the  residual rectal vault.  There is no stool in the rectal vault.  Mucous is  Hemoccult negative.   IMPRESSION:  Linda English is a pleasant 55 year old lady with  history of rectal cancer ultimately requiring low anterior resection back in  2004.  Follow up colonoscopy December 09, 2003 demonstrated no evidence of  recurrent or persisting neoplasia.   By history she has been passing blood per rectum recently.  This is in the  setting of irritable bowel syndrome.  I would suspect that this most likely  is benign  anorectal bleeding, however, with her history she needs to have  her lower gastrointestinal tract imaged again at this time.   RECOMMENDATIONS:  Colonoscopy in the very near future.  Potential risks,  benefits, alternatives have been reviewed and questions answered, she is  agreeable.  She will need SBE prophylaxis as previously recommended.  Will  make plans to perform colonoscopy in the very near future.      Jonathon Bellows, M.D.  Electronically Signed     RMR/MEDQ  D:  10/21/2005  T:  10/21/2005  Job:  [id number]

## 2011-01-15 NOTE — Op Note (Signed)
NAME:  Linda English, Linda English                  ACCOUNT NO.:  [id number]   MEDICAL RECORD NO.:  [id number]                   PATIENT TYPE:  AMB   LOCATION:  DAY                                  FACILITY:  [facility]   PHYSICIAN:  R. Roetta Sessions, M.D.              DATE OF BIRTH:  April 03, 1956   DATE OF PROCEDURE:  06/25/2002  DATE OF DISCHARGE:                                 OPERATIVE REPORT   PROCEDURE:  Esophagogastroduodenoscopy, followed by colonoscopy with snare  polypectomy.   INDICATION FOR PROCEDURE:  The patient is a 55 year old lady with  intermittent constipation and diarrhea, reflux symptoms, NSAID use, was  found to be Hemoccult-positive recently.  EGD and colonoscopy are now being  done to further evaluate these symptoms.  The approach has been discussed  the patient, the potential risks, benefits, and alteratives have been  reviewed and questions answered.  It is notable through my office that CBC  revealed a white count of 6.6, H&H 12.6 and 36.6, MCV was low at 77.6.  TSH  normal at 3.261, free T4 normal at 1.10.  ASA grade 2.   DESCRIPTION OF PROCEDURE:  O2 saturation, blood pressure, pulse, and  respirations were monitored throughout the entirety of both procedures.   Conscious sedation:  Versed 7 mg IV, Demerol 125 mg IV in divided doses.   Instrument:  Olympus video chip gastroscope and colonoscope.   ESOPHAGOGASTRODUODENOSCOPY FINDINGS:  Examination of the tubular esophagus  revealed no mucosal abnormalities.  The EG junction was easily traversed.   Stomach:  The gastric cavity was emptied, insufflated with air.  A thorough  examination of the gastric mucosa, including a retroflexed view of the  proximal stomach, esophagogastric junction demonstrated no abnormalities.  The pylorus was patent and easily traversed.   Duodenum:  The bulb and second portion appeared normal.   Therapeutic/Diagnostic maneuvers performed:  None.  The patient tolerated  this procedure  well.   COLONOSCOPY:  Digital rectal examination revealed no abnormalities.   Endoscopic findings:  Prep was good.   Rectum:  Examination of the rectal mucosa revealed a sessile 3 x 3 cm  polypoid lesion behind a fold in at 10 cm from the anal verge.  Please see  photos.  This was not appreciated on the rectal examination.  The remainder  of the rectal mucosa appeared normal.   Colon:  The colonic mucosa was surveyed from the rectosigmoid junction  through the left, transverse, and right colon to the area of the appendiceal  orifice, ileocecal valve, and cecum.  These structures were well-seen and  photographed for the record.  The patient was noted to have scattered left-  sided diverticula.  The remainder of the colonic mucosa to the cecum  appeared normal.  From the level of the cecum and ileocecal valve, the scope  was slowly withdrawn and all previously-mentioned mucosal surfaces were  again seen.  No other abnormalities were observed.  The polypoid  lesion in  the rectum was resected in piecemeal fashion.  It was difficult to get good  approach, but I resected this with multiple passes in the en face approach  and from a retroflexed position.  Some residual polypoid tissue at the base  was probably left behind.  It took quite awhile to perform this maneuver.  There was some oozing at the polypectomy crater base, which was touched up  with the tip of the snare, and 2 cc of 1:200,000 epinephrine was injected  submucosally with good hemostasis.  The patient tolerated the rather long  colonoscopy very well and was reactivated in endoscopy.   IMPRESSION:  1. Esophagogastroduodenoscopy:  Normal esophagus, stomach, and duodenum     through the second portion.  2. Colonoscopy findings:  Sessile polypoid mass in the rectum as described     above, resected in piecemeal fashion.  I feel that there is probably some     residual polypoid mucosa left behind.  Oozing treated as described  above.     Remainder of rectal mucosa appeared normal.  Scattered left-sided     diverticula.  Remainder of colonic mucosa appeared normal.   RECOMMENDATIONS:  1. No aspirin or arthritis medications for 10 days.  2. Follow up on pathology.  3. If the pathology contains invasive carcinoma, this lesion may be amenable     for complete excision via the transanal approach.  4. Colace 100 mg orally b.i.d. for the next 10 days.   Further recommendations to follow.                                                  Jonathon Bellows, M.D.    RMR/MEDQ  D:  06/25/2002  T:  06/25/2002  Job:  [id number]   cc:   Gerrit Friends. Aldona Bar, M.D.  [address]  [place]  [place] [address]  Fax: [phone]

## 2011-01-15 NOTE — Discharge Summary (Signed)
[place]  Patient:    Linda English, Linda English               MRN: [id number] Proc. Date: 01/13/00 Adm. Date:  [id number] Disc. Date: [id number] Attending:  Jackelyn Knife                           Discharge Summary  SUMMARY:  [name] is a generally healthy 55 year old female, who presented with low back and radiating left leg pain.  Subsequent work-up included an MRI which demonstrated a ruptured intervertebral disk on the left at L5-S1.  She did not respond appropriately to conservative measures and therefore was admitted for surgical decompression.  After an appropriate preoperative work-up including an explanation of the goals and risks of the procedure, she was taken to the operating room on Jan 12, 2000.  She underwent a left L5-S1 hemilaminectomy and diskectomy using the operating microscope.  She did well postoperatively and was able to go home on Jan 13, 2000.  The incision was clean and healing at the time of discharge.  FINAL DIAGNOSIS:  Herniated intervertebral disk, left L5-S1.  CONDITION AT DISCHARGE:  Improving.  INSTRUCTIONS TO PATIENT: 1. Up ad lib with a 10-pound weight lifting restriction for four weeks. 2. Regular diet. 3. Back to see me in a week.  DISCHARGE MEDICATION:  Vicodin which she already has at home. DD:  01/13/00 TD:  01/16/00 Job: [id number]

## 2011-01-15 NOTE — Op Note (Signed)
NAME:  Linda English, Linda English                  ACCOUNT NO.:  [id number]   MEDICAL RECORD NO.:  [id number]                   PATIENT TYPE:  AMB   LOCATION:  DAY                                  FACILITY:  [facility]   PHYSICIAN:  Currie Paris, M.D.           DATE OF BIRTH:  03-15-1956   DATE OF PROCEDURE:  07/13/2002  DATE OF DISCHARGE:                                 OPERATIVE REPORT   PREOPERATIVE DIAGNOSIS:  Carcinoma of the rectum.   POSTOPERATIVE DIAGNOSIS:  Carcinoma of the rectum.   OPERATION:  Rectal exam under anesthesia with proctoscopy and rectal  ultrasound.   SURGEON:  Currie Paris, M.D.  Timothy E. Earlene Plater, M.D.   ANESTHESIA:  IV sedation.   CLINICAL HISTORY:  [name] recently had a procto which showed a small  polypoid lesion that was piecemealed out by the gastroenterologist and  unfortunately showed carcinoma.  Because of this, she was referred for  surgical evaluation.  Because of the patient's anxiety and discomfort, I was  unable to get an adequate rectal exam or anoscopy to localize this lesion  further for surgical planning purposes.  In addition, we felt rectal  ultrasound would be indicated to see if we could establish a depth and see  if there is any possibility what looked like on the pictures from the  colonoscopy was a superficial lesion that might be able to be resected  transanally.   DESCRIPTION OF PROCEDURE:  The patient seen in the holding area and had no  further questions.  She was taken to the operating room and given some very  light IV sedation, placed in lithotomy position.  Digital rectal exam showed  no abnormalities.  She was proctoscoped to about 20 cm.  At 8 cm, on the  left side, was the site of the prior polypectomy which has almost healed  over, but there was still a raw surface which bled a little bit when  touched.  This appeared to be very superficial.  There were no surrounding  mucosal changes and appeared to  be only a couple of millimeters in size.  The remaining rectal exam with the proctoscope was unremarkable.   At this point, Timothy E. Earlene Plater, M.D. did rectal ultrasound with the  findings of some mucosal abnormality at the area of the polypectomy, but the  submucosa appeared intact.  There were no abnormalities deeper and no nodes  or other problems noted.   IMPRESSION:  Superficial rectal carcinoma, partially excised.   PLAN:  I think she would be a candidate for a transanal excision in hopes of  getting wide margin around this area.  We will discuss that and try to get  that scheduled for her for next week.  Currie Paris, M.D.    CJS/MEDQ  D:  07/13/2002  T:  07/13/2002  Job:  [id number]

## 2011-01-15 NOTE — Op Note (Signed)
[place]. [facility]  Patient:    Linda English                   MRN: [id number] Proc. Date: 01/12/00 Attending:  Izell Declo. Elesa Hacker, M.D.                           Operative Report  PREOPERATIVE DIAGNOSIS:  Herniated ______ left L5, S1.  POSTOPERATIVE DIAGNOSIS:  Herniated ______ left L5, S1.  OPERATION PERFORMED:  Left L5, S1 hemilaminotomy and discectomy.  ANESTHESIA:  Under general endotracheal anesthesia, the usual ______ approach was carried out the left L5, S1 intralaminar space.  The proper operative level was confirmed with intraoperative x-rays.  Using an operating microscope ______, a hemilaminotomy was performed.  The underlying dural sac and nerve root were retracted initially with some difficulty because of an underlying soft tissue mass.  This proved to be a locally substantial protrusion.  Part of the annulus was calcified and this was removed with a 3 mm Kerrison.  Degenerative disc material was then removed from the interspace proper, both medially and laterally ______ decompression the nerve root dural sac.  The wound was copiously irrigated with antibiotic solution and enclosed ______.  A sterile dressing was applied and the patient was taken to the recovery room in good condition. DD:  01/12/00 TD:  01/12/00 Job: [id number]

## 2011-01-15 NOTE — H&P (Signed)
[facility]  Patient:    Linda English, Linda English                 MRN: [id number] Adm. Date:  01/12/00 Attending:  Izell [place]. Elesa Hacker, M.D.                         History and Physical  HISTORY OF PRESENT ILLNESS:  Linda English is a 55 year old lady seen through the courtesy of Reuben Likes, M.D.  She presents with a history of low back pain that goes back some three to four years, but with a change in her symptoms that occurred about two months ago.  This was characterized by a rather dramatic increase in the amount of low back pain that she noted, associated with pain radiating down her left leg.  She has had two months of conservative treatment, including physical therapy, but this has not helped. Indeed, in the face of conservative treatment and therapy, she has worsened. She had an MRI study done that demonstrated a herniated intervertebral disk at L5-S1 consistent with her symptoms and is admitted at this time for surgical decompression at that level.  PAST MEDICAL HISTORY:  Pertinent past medical history revealed that she has had some problems with increased blood sugar, but only during pregnancy.  She was told years ago that she had a minor heart valve problem with a murmur and has been taking antibiotics when she goes to the dentist.  Otherwise she has been in good health.  FAMILY HISTORY:  Her mother is living at age 33, but with diabetes and asthma. Her father is living at age 70 with prostate difficulty.  She has a 68 year old brother with high blood pressure.  She has a 47 year old sister living and well.  She has two children living and well.  PAST SURGICAL HISTORY:  Previous surgery consists of a C-section in 12 and 1987.  She had a gallbladder removal in 1987.  She had a hysterectomy in 1988.  CURRENT MEDICATIONS:  Xanax, Celexa, Klonopin, and Midrin.  PERSONAL HISTORY:  She does not smoke or use alcohol.  REVIEW OF SYSTEMS:   Pertinent as already mentioned.  PHYSICAL EXAMINATION:  On examination, she is alert and cooperative.  HEENT:  Examination is normal.  NECK:  She has no cervical adenopathy, bruits, or mass lesions.  CHEST:  Clear bilaterally.  CARDIAC:  Examination reveals an apical murmur which is somewhat hard for me to hear.  ABDOMEN:  Soft and nontender with no palpable masses.  NEUROLOGIC:  Sensorium and cranial nerves are intact.  Range of motion of her lumbar spinal spine is limited on forward bending and tilting to the left because of increased pain.  She has no adenopathy in her back, buttocks, or legs.  Straight leg raising is quite painful on the left and is negative on the right.  She has weakness of her left gastrocnemius, which fatigues easily. She has a diminished left ankle reflex.  She has S1 hypesthesia on the left and a slight amount of L5 hypesthesia as well.  Peripheral pulses are palpable.  IMPRESSION:  Herniated intervertebral disk, left L5-S1. DD:  01/12/00 TD:  01/12/00 Job: [id number]

## 2011-01-15 NOTE — Op Note (Signed)
NAME:  Linda English, Linda English                  ACCOUNT NO.:  [id number]   MEDICAL RECORD NO.:  [id number]                   PATIENT TYPE:  AMB   LOCATION:  DAY                                  FACILITY:  [facility]   PHYSICIAN:  Currie Paris, M.D.           DATE OF BIRTH:  01/31/1956   DATE OF PROCEDURE:  07/18/2002  DATE OF DISCHARGE:                                 OPERATIVE REPORT   CCS#:  [id number]   PREOPERATIVE DIAGNOSIS:  Carcinoma of the rectum.   POSTOPERATIVE DIAGNOSIS:  Carcinoma of the rectum.   OPERATION:  Transanal excision carcinoma of the rectum.   SURGEON:  Currie Paris, M.D.   ANESTHESIA:  General endotracheal.   CLINICAL HISTORY:  This patient is a 55 year old who was recently scoped and  found to have a polypoid lesion at the rectum that was piecemeal removed and  found to have invasive carcinoma. She was sent for surgical evaluation and  we did a rigid proctoscopy and saw the area in question was at 8 cm about  the mid lateral position. It actually just looked like a healing biopsy  site. Rectal ultrasound showed no evidence of invasive disease deep and  preoperative CT scan was likewise unremarkable. The patient strongly desired  preservation of her rectum and so we elected to do a transanal excision of  this area to see if there was residual carcinoma and get an assessment of  depth. She recognized that she may yet require either further radiation  therapy and/or more complete excision with an abdominoperineal but hopefully  this was superficial enough that repeat excision would at least allow her  preservation of her rectum. She also was counselled on the complications  including infection, perforation, etc. She was prepared to proceed.   DESCRIPTION OF PROCEDURE:  The patient was seen in the holding area and had  no further questions. She was taken into the operating room and after  satisfactory general endotracheal anesthesia was obtained was  placed in the  prone jack-knife position on the operating room table. The perianal area was  prepped and draped. I used 0.25% Marcaine with epinephrine and injected  around the sphincter muscle and tried to massage that in and stretch the  sphincter a little bit. We used a long operating anoscope with the light to  visualize and could visualize the area in question and again measured it at  exactly 8 cm. I injected additional Marcaine with epinephrine submucosally  all around the lesion and put a traction suture below it so that I could get  a little better access to it.   We waited several minutes for the local to take effect to reduce bleeding  and then I was able to put a crown suture above the lesion and tie this  down so that I would be ready for a closing suture. Starting proximally I  did an elliptical incision with the cautery around the lesion and got  into  perirectal fat so I felt we had a complete deep margin as we could get. The  margins appear to be about a centimeter on either side of the lesion  although as we cut the mucosa as always did retract producing what appeared  to be a smaller margin.   Once the area was removed, I used the chromic that I had previously placed  and closed the mucosa through and through with a running locked suture of  this 2-0 Chromic. This produced what appeared to be an adequate closure that  was hemostatic. We waited several minutes to make sure there was no bleeding  and then put some Gelfoam in.   The patient tolerated the procedure well. There were no operative  complications. All counts were correct.                                               Currie Paris, M.D.    CJS/MEDQ  D:  07/18/2002  T:  07/18/2002  Job:  [id number]   cc:   Kingsley Callander. Ouida Sills, M.D.  [address]  [place]  [place] [address]  Fax: [phone]   R. Roetta Sessions, M.D.  [address]  [place]  [place] [address]  Fax: [phone]

## 2011-01-15 NOTE — Op Note (Signed)
NAME:  Linda English, Linda English        ACCOUNT NO.:  [id number]   MEDICAL RECORD NO.:  [id number]          PATIENT TYPE:  AMB   LOCATION:  DAY                           FACILITY:  [facility]   PHYSICIAN:  R. Roetta Sessions, M.D. DATE OF BIRTH:  06/21/1956   DATE OF PROCEDURE:  10/27/2005  DATE OF DISCHARGE:                                 OPERATIVE REPORT   PROCEDURE:  Colonoscopy, diagnostic.   INDICATIONS FOR PROCEDURE:  The patient is a 55 year old African-American  female with intermittent painless hematochezia. She is status post laryngeal  resection for colorectal cancer back in 2004. She had a negative followup  colonoscopy by me in 2005. She notes intermittent postprandial abdominal  cramps and diarrhea. Sometimes when she has diarrhea, she notes a little  blood on the toilet paper. Colonoscopy is now being done. This approach has  been discussed with the patient at length. Potential risks, benefits, and  alternatives have been reviewed and questions answered. Please see  documentation in the medical record.   PROCEDURE NOTE:  O2 saturation, blood pressure, pulse, and respirations were  monitored throughout the entire procedure. Conscious sedation with Versed 4  mg IV and Demerol 100 mg IV in divided doses.  Gentamicin 140 mg IV,  ampicillin 2 g IV prior to the procedure for SB prophylaxis.   INSTRUMENT:  Olympus video chip system.   FINDINGS:  Digital rectal exam revealed no abnormalities.   ENDOSCOPIC FINDINGS:  Unfortunately, the prep was suboptimal to rather poor.   Rectum:  Examination of the rectal mucosa including retroflexed view of the  anal verge revealed a surgical anastomosis at approximately 3 cm. The  patient had some minimal internal/anal canal hemorrhoids. Anastomotic mucosa  appeared normal. The small amount of rectum left appeared just fine.   Colon:  Colonic mucosa was surveyed from the anastomosis all the way to the  cecum. Cecum, ileocecal valve and  appendiceal orifice were well seen and  photographed for the record. From this level, the scope was slowly  withdrawn, and all previously mentioned mucosal surfaces were again seen.  She had a couple of distal tics. The remainder of the colonic mucosa  appeared normal. I did not see any evidence of intercurrent neoplasm. Prep  was relatively poor, had to copiously wash several segments of the residual  colon and suctioned out fluid to get adequate visualization, but her  residual colon looks to be in good shape. The patient tolerated the  procedure well and was reactive to endoscopy.   IMPRESSION:  Anal canal hemorrhoids. Surgical anastomosis at 3 cm from the  anal verge appeared normal. Few scattered distal diverticula. The residual  colonic mucosa appeared normal. I suspect the patient bled from hemorrhoids  in the setting of irritable bowel syndrome.   RECOMMENDATIONS:  1.  Hemorrhoid and diverticulosis literature provided to Ms. Lebo.      Anusol HC suppositories 1 per rectum at bedtime for 10 days.  2.  Prescription for NuLev 1 tablet on tongue before meals and at bedtime      p.r.n. abdominal cramps and diarrhea.  3.  The patient is to let me  know if rectal bleeding recurs. Otherwise, she      should have another colonoscopy in five years from now.      Jonathon Bellows, M.D.  Electronically Signed     RMR/MEDQ  D:  10/27/2005  T:  10/28/2005  Job:  [id number]   cc:   Selena Batten, M.D.  [facility]. Ouida Sills, MD  Fax: [phone]

## 2011-01-15 NOTE — Procedures (Signed)
   NAME:  Linda English, Linda English                  ACCOUNT NO.:  [id number]   MEDICAL RECORD NO.:  [id number]                   PATIENT TYPE:  OUT   LOCATION:  RAD                                  FACILITY:  [facility]   PHYSICIAN:  Vida Roller, M.D.                DATE OF BIRTH:  12/04/1955   DATE OF PROCEDURE:  10/02/2002  DATE OF DISCHARGE:                                  ECHOCARDIOGRAM   REFERRING PHYSICIAN:  Kingsley Callander. Ouida Sills, M.D., [place], [place].   REASON FOR PRESENTATION:  This is a preoperative assessment for a patient  with rectal cancer with a history of mitral valve prolapse.   M-MODE MEASUREMENTS:  The aorta measures 25  mm.  The left atrium is 41 mm which is enlarged.  The septum is 13 mm which is enlarged.  The posterior wall was 12 mm which is enlarged.  The left ventricular diastolic dimension is 44 mm.  The left ventricular systolic dimension is 31 mm.   TWO-DIMENSIONAL AND DOPPLER IMAGING:  The left ventricle is normal size with  mild concentric left ventricular hypertrophy, estimated ejection fraction is  60-65%.  Diastolic function was not assessed.  There are no wall motion  abnormalities seen.  The right ventricle is normal size with normal systolic function.  The right and left atrium were both slightly enlarged and the atrial septum  appears intact.  The aortic valve is trileaflet, tri-commissural with mild thickening  significant in the non coronary and left coronary cusp.  There is mild  aortic insufficiency.  No stenosis is seen.  The mitral valve is morphologically unremarkable with mild mitral  regurgitation.  The mitral valve does not significantly prolapse.  There is  no stenosis.  The pulmonic valves is morphologically unremarkable with trace pulmonic  insufficiency.  The tricuspid valve was morphologically unremarkable with mild tricuspid  regurgitation. No stenosis is seen.   There appears to be a catheter in the right atrium, although  this is not  well seen.                                                Vida Roller, M.D.    JH/MEDQ  D:  10/02/2002  T:  10/03/2002  Job:  [id number]

## 2011-01-15 NOTE — Op Note (Signed)
NAME:  Linda English, Linda English                  ACCOUNT NO.:  [id number]   MEDICAL RECORD NO.:  [id number]                   PATIENT TYPE:  OIB   LOCATION:  3008                                 FACILITY:  [facility]   PHYSICIAN:  Cristi Loron, M.D.            DATE OF BIRTH:  03-23-1956   DATE OF PROCEDURE:  03/30/2004  DATE OF DISCHARGE:                                 OPERATIVE REPORT   BRIEF HISTORY:  The patient is a 55 year old black female who has suffered  from neck and right arm pain.  She has failed medical management and was  worked up with a cervical MRI which demonstrated a large herniated disc at  C6-C7 on the right.  The patient's signs, symptoms, and physical exam were  consistent with a right C7 radiculopathy.  I discussed the various treatment  options with the patient including surgery.  The patient has weighed the  risks, benefits, and alternatives of surgery and decided to proceed with a  C6-C7 anterior cervical discectomy, fusion, and plating.   PREOPERATIVE DIAGNOSIS:  Right C6-C7 herniated nucleus pulposus, spinal  stenosis, cervical radiculopathy, cervicalgia, degenerative disc disease.   POSTOPERATIVE DIAGNOSIS:  Right C6-C7 herniated nucleus pulposus, spinal  stenosis, cervical radiculopathy, cervicalgia, degenerative disc disease.   PROCEDURE:  C6-C7 extensive anterior cervical discectomy/decompression;  interbody iliac crest allograft arthrodesis; anterior cervical plating  (Codman Slimlock titanium plate and screws).   SURGEON:  Cristi Loron, M.D.   ASSISTANT:  Payton Doughty, M.D.   ANESTHESIA:  General endotracheal anesthesia.   ESTIMATED BLOOD LOSS:  100 mL.   SPECIMENS:  None.   DRAINS:  None.   COMPLICATIONS:  None.   DESCRIPTION OF PROCEDURE:  The patient was brought to the operating room by  the anesthesia team.  General endotracheal anesthesia was induced.  The  patient remained in the supine position.  A roll was placed under her  shoulders to place the neck in slight extension.  Her anterior cervical  region was then prepared with Betadine scrub and Betadine solution.  Sterile  drapes were applied.  I then injected the area to be incised with Marcaine  with epinephrine solution.  I used a scalpel to make a transverse incision  in the patient's left anterior neck.  I used the Metzenbaum scissors to  divide the platysmal muscle and to dissect medial to the sternocleidomastoid  muscle, jugular vein, and carotid artery.  I bluntly dissected down towards  the anterior cervical spine, carefully identified the esophagus and  retracted it medially.  I used then cleared the soft tissue from the  anterior cervical spine using Kitner swabs.  There was a small artery  running from the carotid artery to the esophagus which needed to be ligated  to provide exposure.  I coagulated it.  I placed a small hemoclip on the  proximal end and then ligated the small artery.  I then inserted a bent  spinal needle into the  upper exposed interspace and obtained an  interoperative radiograph to confirm our location.   I then used electrocautery to detach the medial border of the longus colli  muscles bilaterally from C6-C7 intervertebral disc space bilaterally.  We  inserted the Caspar self-retaining retractor underneath the longus colli  muscle to provide exposure.  I then incised the C6-C7 intervertebral disc  with a 15 blade scalpel and performed a partial discectomy using Carlens  curets and pituitary forceps.  I then inserted distraction screws at C6 and  C7 and distracted the interspace.  I then used the high speed drill to  decorticate the vertebral endplates at C6-C7, drill away the remainder of  the C6-C7 intervertebral disc, thinned out the posterior spondylosis.  I  then incised the thinned out ligament with the knife and removed it with the  Kerrison punch under cutting the vertebral endplate at C6-C7, decompressing  the thecal  sac and performing a foraminotomy about the bilateral C7 nerve  roots.  Of note, on the right side, there was a large herniated disc which I  removed with the pituitary forceps and Kerrison punches.  This completed the  decompression.   I now turned attention to the arthrodesis.  I obtained iliac crest  tricortical allograft bone graft and fashioned it to the approximate  dimensions, 8 mm height, 1 cm depth.  I inserted one bone graft in the  distracted C6-C7 interspace, removed the distraction screws, and there was a  good snug fit of bone at both levels.   I now turned attention to the anterior spine instrumentation.  I used a high  speed drill to decorticate the vertebral endplates at C6-C7 so that the  plate would lay down flat.  I then selected the appropriate length Codman  anterior cervical plate and laid it along the anterior aspect of the  vertebral bodies of C6 and C7, drilled two holes into C6 and two at C7.  I  secured the plate to the vertebral bodies by placing two 12 mm screws at C6  and two at C7.  We obtained an interoperative radiograph.  We could not see  the plate or screws because of the patient's body habitus but they looked  good en vivo.  We secured the screws and plate by locking each cam.   We then achieved stringent hemostasis using bipolar electrocautery.  We  copiously irrigated the wound out with Bacitracin solution and removed the  solution.  We then removed the Caspar self-retaining retractor.  We  inspected the esophagus for any damage and there was none apparent.  We then  reapproximated the patient's platysmal muscle with interrupted 3-0 Vicryl  suture, the subcutaneous tissue with interrupted 3-0 Vicryl suture, and the  skin with Steri-Strips and Benzoin.  The wound was then coated with  Bacitracin ointment.  A sterile dressing was applied, the drapes were removed.  The patient was subsequently extubated by the anesthesia team and  transported to the  post anesthesia care unit in stable condition.  All  sponge, instrument and needle counts were correct at the end of the case.                                               Cristi Loron, M.D.    JDJ/MEDQ  D:  03/30/2004  T:  03/30/2004  Job:  [id number] 

## 2011-02-09 ENCOUNTER — Encounter: Payer: Self-pay | Admitting: Orthopedic Surgery

## 2011-02-09 ENCOUNTER — Ambulatory Visit (INDEPENDENT_AMBULATORY_CARE_PROVIDER_SITE_OTHER): Payer: BC Managed Care – PPO | Admitting: Orthopedic Surgery

## 2011-02-09 DIAGNOSIS — IMO0002 Reserved for concepts with insufficient information to code with codable children: Secondary | ICD-10-CM

## 2011-02-09 DIAGNOSIS — M25569 Pain in unspecified knee: Secondary | ICD-10-CM | POA: Insufficient documentation

## 2011-02-09 DIAGNOSIS — M1710 Unilateral primary osteoarthritis, unspecified knee: Secondary | ICD-10-CM

## 2011-02-09 MED ORDER — TRAMADOL-ACETAMINOPHEN 37.5-325 MG PO TABS: 1.0000 | ORAL_TABLET | ORAL | Status: AC | PRN

## 2011-02-09 MED ORDER — METHYLPREDNISOLONE ACETATE 40 MG/ML IJ SUSP: 40.0000 mg | Freq: Once | INTRAMUSCULAR | Status: DC

## 2011-02-09 NOTE — Procedures (Signed)
Verbal consent was obtained   Time out completed   Under sterile conditions the right knee was injected with Depomedrol 40 mg / ml (1 ml) and lidocaine 1% (4 ml)  There were no complications   Injection LEFT knee.  Consent was obtained.  Time out was taken   LEFT knee was injected with Depo-Medrol 40 mg plus lidocaine 1% 4 cc.  Knee was prepped with alcohol and anesthetized with ethyl chloride.  The injection was tolerated without complication.    

## 2011-02-09 NOTE — Patient Instructions (Signed)
You have received a steroid shot. 15% of patients experience increased pain at the injection site with in the next 24 hours. This is best treated with ice and tylenol extra strength 2 tabs every 8 hours. If you are still having pain please call the office.    

## 2011-02-09 NOTE — Progress Notes (Signed)
Diagnosis osteoarthritis, both knees.  Followup visit.  Status post physical therapy for her knees.  Pain still present, especially going up and downstairs on the LEFT knee.  Pain seems to be intermittent and to come and go intensity seems to vary. No significant swelling or giving out episodes.  GENERAL: normal development   CDV: pulses are normal   Skin: normal  Lymph: deferred  Psychiatric: awake, alert and oriented  Neuro: normal sensation  MSK  GENERAL: normal development   CDV: pulses are normal   Skin: normal  Lymph: deferred  Psychiatric: awake, alert and oriented  Neuro: normal sensation  MSK  Bilateral knee.  No joint effusion. Smooth range of motion. Crepitance bilaterally patellofemoral joint. Knees are stable. Muscle tone is normal.  Bilateral knee injection.  Bilateral knee osteoarthritis, primarily patellofemoral.  Injections tolerated.  Start tramadol/acetaminophen.  6 month follow-up with x rays

## 2011-05-21 LAB — CBC
Platelets: 312
RDW: 14.6
WBC: 4

## 2011-05-21 LAB — DIFFERENTIAL
Basophils Absolute: 0
Lymphocytes Relative: 28
Lymphs Abs: 1.1
Neutro Abs: 2.4
Neutrophils Relative %: 60

## 2011-06-24 ENCOUNTER — Ambulatory Visit: Payer: BC Managed Care – PPO | Admitting: Orthopedic Surgery

## 2011-07-13 ENCOUNTER — Encounter: Payer: Self-pay | Admitting: Internal Medicine

## 2011-07-14 ENCOUNTER — Ambulatory Visit (INDEPENDENT_AMBULATORY_CARE_PROVIDER_SITE_OTHER): Payer: BC Managed Care – PPO | Admitting: Gastroenterology

## 2011-07-14 ENCOUNTER — Encounter: Payer: Self-pay | Admitting: Gastroenterology

## 2011-07-14 DIAGNOSIS — R1013 Epigastric pain: Secondary | ICD-10-CM

## 2011-07-14 DIAGNOSIS — K5900 Constipation, unspecified: Secondary | ICD-10-CM

## 2011-07-14 DIAGNOSIS — K219 Gastro-esophageal reflux disease without esophagitis: Secondary | ICD-10-CM

## 2011-07-14 MED ORDER — POLYETHYLENE GLYCOL 3350 17 GM/SCOOP PO POWD: 17.0000 g | Freq: Every day | ORAL | Status: AC | PRN

## 2011-07-14 NOTE — Progress Notes (Deleted)
LL

## 2011-07-14 NOTE — Progress Notes (Signed)
Primary Care Physician:  Cranford Mon,   Primary Gastroenterologist:  Roetta Sessions, MD   Chief Complaint  Patient presents with  . Gastrophageal Reflux  . Abdominal Pain    sometimes    HPI:  Linda English is a 55 y.o. female here for further evaluation of epigastric discomfort. C/O tight epigastric feeling, associated with nausea, lack of appetite. Vomited several times. Undigested food several hours later. Switched from Nexium to Prilosec awhile ago due to cost. Recently switched back to Nexium to see if it would help. Two weeks ago started taking Nexium BID.  Now not vomiting but still nausea. Days without BM, then epigastric pain. Finally when has a BM (sometimes only once a week), may feel somewhat better. Awful sweet taste in mouth, most of the time. No brbpr, melena. No diarrhea.      Current Outpatient Prescriptions  Medication Sig Dispense Refill  . amLODipine-benazepril (LOTREL) 5-20 MG per capsule Take 1 capsule by mouth daily.        Marland Kitchen buPROPion (WELLBUTRIN XL) 150 MG 24 hr tablet Take 1 tablet by mouth daily.      Marland Kitchen BYETTA 10 MCG PEN 10 MCG/0.04ML SOLN Inject 10 mcg into the skin 2 (two) times daily.       . clonazePAM (KLONOPIN) 1 MG tablet Take 1 tablet by mouth Every 6 hours as needed.      . CYMBALTA 60 MG capsule Take 1 tablet by mouth daily.      . folic acid (FOLVITE) 1 MG tablet Take 1 tablet by mouth daily.      Marland Kitchen glipiZIDE (GLUCOTROL) 10 MG 24 hr tablet Take 1 tablet by mouth daily.      Marland Kitchen Hyoscyamine Sulfate (HYOMAX-DT) 0.375 MG TBCR Take by mouth. As directed      . methotrexate (RHEUMATREX) 2.5 MG tablet Take 3 tablets by mouth Once a week. Wednesday      . mirtazapine (REMERON) 15 MG tablet Take 1 tablet by mouth daily.      Marland Kitchen NEXIUM 40 MG capsule Take 40 mg by mouth 2 (two) times daily.       . ONE TOUCH ULTRA TEST test strip 1 strip by Does not apply route Once daily as needed.      . ABILIFY 2 MG tablet Take 1 tablet by mouth daily.      Marland Kitchen aspirin  81 MG tablet Take 81 mg by mouth daily.        . metFORMIN (GLUCOPHAGE-XR) 500 MG 24 hr tablet Take 1,000 mg by mouth daily.       . polyethylene glycol powder (MIRALAX) powder Take 17 g by mouth daily as needed.  255 g  0   Current Facility-Administered Medications  Medication Dose Route Frequency Provider Last Rate Last Dose  . methylPREDNISolone acetate (DEPO-MEDROL) injection 40 mg  40 mg Intra-articular Once Fuller Canada, MD      . methylPREDNISolone acetate (DEPO-MEDROL) injection 40 mg  40 mg Intra-articular Once Fuller Canada, MD        Allergies as of 07/14/2011  . (No Known Allergies)    Past Medical History  Diagnosis Date  . Anxiety   . Allergic rhinitis   . Acid reflux   . IBS (irritable bowel syndrome)   . Depression   . Panic disorder   . Chronic headache   . Rectal cancer   . DM (diabetes mellitus)   . Hypertension   . Obstructive sleep apnea   . Lichen planus   .  Hyperlipidemia     Past Surgical History  Procedure Date  . Spine surgery 2001    L5,S1 HEMILAMINOTOMY AND DISCECTOMY/DR DEATON  . Cholecystectomy   . Low anterior rection 2003    RECTAL CANCER  . Cesarean section     X  2  . Appendectomy   . Colonoscopy 08/2007    DR Jena Gauss, friable anal canal hemorrhoids, surgical anastomosis at 3cm, distal scattered tics  . Esophagogastroduodenoscopy 05/2002    DR Jena Gauss, normal  . Colonoscopy 12/15/2010    anal papilla and internal hemorrhoids/diminutive polyp in the base of the cecum. Past, tubular adenoma.. Next colonoscopy due in April 2017.    Family History  Problem Relation Age of Onset  . Colon cancer Paternal Grandfather 24  . Colon polyps Brother   . Colon polyps Cousin     History   Social History  . Marital Status: Divorced    Spouse Name: N/A    Number of Children: N/A  . Years of Education: N/A   Occupational History  . teacher     [employer]   Social History Main Topics  . Smoking status: Never Smoker   . Smokeless  tobacco: Never Used  . Alcohol Use: No  . Drug Use: No  . Sexually Active: No   Other Topics Concern  . Not on file   Social History Narrative  . No narrative on file      ROS:  General: Negative for anorexia, weight loss, fever, chills, fatigue, weakness. Eyes: Negative for vision changes.  ENT: Negative for hoarseness, difficulty swallowing , nasal congestion. CV: Negative for chest pain, angina, palpitations, dyspnea on exertion, peripheral edema.  Respiratory: Negative for dyspnea at rest, dyspnea on exertion, cough, sputum, wheezing.  GI: See history of present illness. GU:  Negative for dysuria, hematuria, urinary incontinence, urinary frequency, nocturnal urination.  MS: Negative for joint pain, low back pain.  Derm: Negative for rash or itching.  Neuro: Negative for weakness, abnormal sensation, seizure, frequent headaches, memory loss, confusion.  Psych: Negative for anxiety, depression, suicidal ideation, hallucinations.  Endo: Negative for unusual weight change.  Heme: Negative for bruising or bleeding. Allergy: Negative for rash or hives.    Physical Examination:  BP 142/86  Pulse 94  Temp(Src) 97.2 F (36.2 C) (Temporal)  Ht 5\' 2"  (1.575 m)  Wt 198 lb 9.6 oz (90.084 kg)  BMI 36.32 kg/m2   General: Well-nourished, well-developed in no acute distress.  Head: Normocephalic, atraumatic.   Eyes: Conjunctiva pink, no icterus. Mouth: Oropharyngeal mucosa moist and pink , no lesions erythema or exudate. Neck: Supple without thyromegaly, masses, or lymphadenopathy.  Lungs: Clear to auscultation bilaterally.  Heart: Regular rate and rhythm, no murmurs rubs or gallops.  Abdomen: Bowel sounds are normal, moderate epigastric tenderness, nondistended, no hepatosplenomegaly or masses, no abdominal bruits or    hernia , no rebound or guarding.   Rectal: Not performed. Extremities: No lower extremity edema. No clubbing or deformities.  Neuro: Alert and oriented x 4 ,  grossly normal neurologically.  Skin: Warm and dry, no rash or jaundice.   Psych: Alert and cooperative, normal mood and affect.

## 2011-07-14 NOTE — Patient Instructions (Signed)
We have scheduled you for an upper endoscopy to further evaluate your abdominal pain, vomiting. Please see separate instructions.  Please start MiraLax 17 g each night as needed for constipation. Recommend taking daily until you establish a regular bowel pattern then use only on days you do not have a bowel movement.

## 2011-07-16 ENCOUNTER — Encounter: Payer: Self-pay | Admitting: Gastroenterology

## 2011-07-16 NOTE — Assessment & Plan Note (Signed)
Start Miralax

## 2011-07-16 NOTE — Assessment & Plan Note (Signed)
Recent onset epigastric pain associated with nausea and GERD. Failed to respond to change in PPI or increase to PPI BID. Remote EGD. Recommend EGD for further evaluation. DDx includes gastritis, PUD, gastroparesis.  I have discussed the risks, alternatives, benefits with regards to but not limited to the risk of reaction to medication, bleeding, infection, perforation and the patient is agreeable to proceed. Written consent to be obtained.

## 2011-07-19 NOTE — Progress Notes (Signed)
Cc to PCP 

## 2011-07-28 ENCOUNTER — Encounter (HOSPITAL_COMMUNITY): Payer: Self-pay | Admitting: Pharmacy Technician

## 2011-08-02 MED ORDER — SODIUM CHLORIDE 0.45 % IV SOLN
Freq: Once | INTRAVENOUS | Status: AC
  Administered 2011-08-03: 14:00:00 via INTRAVENOUS

## 2011-08-03 ENCOUNTER — Encounter (HOSPITAL_COMMUNITY): Payer: Self-pay | Admitting: *Deleted

## 2011-08-03 ENCOUNTER — Ambulatory Visit (HOSPITAL_COMMUNITY)
Admission: RE | Admit: 2011-08-03 | Discharge: 2011-08-03 | Disposition: A | Discharge status: Home / Self Care | Payer: BC Managed Care – PPO | Source: Ambulatory Visit | Attending: Internal Medicine | Admitting: Internal Medicine

## 2011-08-03 ENCOUNTER — Other Ambulatory Visit: Payer: Self-pay | Admitting: Internal Medicine

## 2011-08-03 ENCOUNTER — Encounter (HOSPITAL_COMMUNITY)
Admission: RE | Disposition: A | Discharge status: Home / Self Care | Payer: Self-pay | Source: Ambulatory Visit | Attending: Internal Medicine

## 2011-08-03 DIAGNOSIS — R1013 Epigastric pain: Secondary | ICD-10-CM

## 2011-08-03 DIAGNOSIS — E119 Type 2 diabetes mellitus without complications: Secondary | ICD-10-CM | POA: Insufficient documentation

## 2011-08-03 DIAGNOSIS — D131 Benign neoplasm of stomach: Secondary | ICD-10-CM | POA: Insufficient documentation

## 2011-08-03 DIAGNOSIS — E785 Hyperlipidemia, unspecified: Secondary | ICD-10-CM | POA: Insufficient documentation

## 2011-08-03 DIAGNOSIS — K2940 Chronic atrophic gastritis without bleeding: Secondary | ICD-10-CM | POA: Insufficient documentation

## 2011-08-03 DIAGNOSIS — K219 Gastro-esophageal reflux disease without esophagitis: Secondary | ICD-10-CM

## 2011-08-03 DIAGNOSIS — Z79899 Other long term (current) drug therapy: Secondary | ICD-10-CM | POA: Insufficient documentation

## 2011-08-03 DIAGNOSIS — Z01812 Encounter for preprocedural laboratory examination: Secondary | ICD-10-CM | POA: Insufficient documentation

## 2011-08-03 DIAGNOSIS — I10 Essential (primary) hypertension: Secondary | ICD-10-CM | POA: Insufficient documentation

## 2011-08-03 DIAGNOSIS — K2960 Other gastritis without bleeding: Secondary | ICD-10-CM

## 2011-08-03 HISTORY — PX: ESOPHAGOGASTRODUODENOSCOPY: SHX5428

## 2011-08-03 HISTORY — DX: Unspecified osteoarthritis, unspecified site: M19.90

## 2011-08-03 LAB — GLUCOSE, CAPILLARY: Glucose-Capillary: 120 mg/dL — ABNORMAL HIGH (ref 70–99)

## 2011-08-03 SURGERY — EGD (ESOPHAGOGASTRODUODENOSCOPY)
Anesthesia: Moderate Sedation

## 2011-08-03 MED ORDER — STERILE WATER FOR IRRIGATION IR SOLN
Status: DC | PRN
  Administered 2011-08-03 (×2)

## 2011-08-03 MED ORDER — BUTAMBEN-TETRACAINE-BENZOCAINE 2-2-14 % EX AERO
INHALATION_SPRAY | CUTANEOUS | Status: DC | PRN
  Administered 2011-08-03: 1 via TOPICAL

## 2011-08-03 MED ORDER — MEPERIDINE HCL 100 MG/ML IJ SOLN
INTRAMUSCULAR | Status: AC
  Filled 2011-08-03: qty 1

## 2011-08-03 MED ORDER — MEPERIDINE HCL 100 MG/ML IJ SOLN
INTRAMUSCULAR | Status: DC | PRN
  Administered 2011-08-03: 25 mg via INTRAVENOUS
  Administered 2011-08-03: 50 mg via INTRAVENOUS

## 2011-08-03 MED ORDER — MIDAZOLAM HCL 5 MG/5ML IJ SOLN
INTRAMUSCULAR | Status: AC
  Filled 2011-08-03: qty 10

## 2011-08-03 MED ORDER — MIDAZOLAM HCL 5 MG/5ML IJ SOLN
INTRAMUSCULAR | Status: DC | PRN
  Administered 2011-08-03 (×2): 2 mg via INTRAVENOUS

## 2011-08-03 NOTE — H&P (Signed)
  I have seen & examined the patient prior to the procedure(s) today and reviewed the history and physical/consultation.  Nexium twice a day he has been associated with some improvement in symptoms but nowhere near complete resolution. Otherwise, there have been no changes.  After consideration of the risks, benefits, alternatives and imponderables, the patient has consented to the procedure(s).

## 2011-08-11 ENCOUNTER — Ambulatory Visit (INDEPENDENT_AMBULATORY_CARE_PROVIDER_SITE_OTHER): Payer: BC Managed Care – PPO | Admitting: Orthopedic Surgery

## 2011-08-11 ENCOUNTER — Encounter: Payer: Self-pay | Admitting: Orthopedic Surgery

## 2011-08-11 VITALS — BP 140/84 | Ht 62.0 in | Wt 198.0 lb

## 2011-08-11 DIAGNOSIS — IMO0002 Reserved for concepts with insufficient information to code with codable children: Secondary | ICD-10-CM

## 2011-08-11 DIAGNOSIS — M1710 Unilateral primary osteoarthritis, unspecified knee: Secondary | ICD-10-CM

## 2011-08-11 NOTE — Patient Instructions (Signed)
Continue Ultracet for pain exercise as tolerated and to strengthen her leg muscles

## 2011-08-11 NOTE — Progress Notes (Signed)
Scheduled followup visit.  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 6 month x-ray today.  Current complaints of Pain when she is going up and down the steps and occasionally the knees or legs feel like they will give out. She also has some back pain. She takes Ultracet for pain, which seems to do well.  She also has developed him like a planus and take some methotrexate.  Bilateral knee exam shows full range of motion no effusion. There is no major deformity. She has bilateral crepitus in the patellofemoral joint and has a bilateral positive patellar compression test.  He is stable bilaterally.  New x-rays were obtained, which showed no progression in the arthritis.  Plan is for her to continue her medications and exercises as needed to control. Quadriceps strength.  X-ray report bilateral knee x-rays, AP, lateral, and axial patellofemoral views. These are compared to x-rays taken August 05, 2000,  There's been no progression in the degenerative changes in the patellofemoral area. The tibiofemoral articulation continues to be maintained.  Impression patellofemoral arthritis. No progression

## 2011-08-12 ENCOUNTER — Encounter (HOSPITAL_COMMUNITY): Payer: Self-pay | Admitting: Internal Medicine

## 2011-08-25 ENCOUNTER — Encounter: Payer: Self-pay | Admitting: Internal Medicine

## 2011-09-01 ENCOUNTER — Ambulatory Visit: Payer: BC Managed Care – PPO | Admitting: Gastroenterology

## 2011-09-08 ENCOUNTER — Ambulatory Visit (INDEPENDENT_AMBULATORY_CARE_PROVIDER_SITE_OTHER): Payer: BC Managed Care – PPO | Admitting: Gastroenterology

## 2011-09-08 ENCOUNTER — Encounter: Payer: Self-pay | Admitting: Gastroenterology

## 2011-09-08 VITALS — BP 149/80 | HR 101 | Temp 97.2°F | Ht 64.0 in | Wt 190.6 lb

## 2011-09-08 DIAGNOSIS — K219 Gastro-esophageal reflux disease without esophagitis: Secondary | ICD-10-CM

## 2011-09-08 DIAGNOSIS — R110 Nausea: Secondary | ICD-10-CM

## 2011-09-08 DIAGNOSIS — K589 Irritable bowel syndrome without diarrhea: Secondary | ICD-10-CM

## 2011-09-08 MED ORDER — ALIGN 4 MG PO CAPS: 4.0000 mg | ORAL_CAPSULE | Freq: Every day | ORAL | Status: DC

## 2011-09-08 MED ORDER — FIBER SELECT GUMMIES PO CHEW: 2.0000 | CHEWABLE_TABLET | Freq: Every day | ORAL | Status: DC

## 2011-09-08 NOTE — Patient Instructions (Signed)
Start Fiber Gummies, two daily. Start Align one daily.  Refer to gastroparesis handout provided. Consume 5-6 small meals daily, never overeat, avoid high fat foods. Office visit or call as needed.  Gastroparesis  Gastroparesis is also called slowed stomach emptying (delayed gastric emptying). It is a condition in which the stomach takes too long to empty its contents. It often happens in people with diabetes.  CAUSES  Gastroparesis happens when nerves to the stomach are damaged or stop working. When the nerves are damaged, the muscles of the stomach and intestines do not work normally. The movement of food is slowed or stopped. High blood glucose (sugar) causes changes in nerves and can damage the blood vessels that carry oxygen and nutrients to the nerves. RISK FACTORS  Diabetes.   Post-viral syndromes.   Eating disorders (anorexia, bulimia).   Surgery on the stomach or vagus nerve.   Gastroesophageal reflux disease (rarely).   Smooth muscle disorders (amyloidosis, scleroderma).   Metabolic disorders, including hypothyroidism.   Parkinson's disease.  SYMPTOMS   Heartburn.   Feeling sick to your stomach (nausea).   Vomiting of undigested food.   An early feeling of fullness when eating.   Weight loss.   Abdominal bloating.   Erratic blood glucose levels.   Lack of appetite.   Gastroesophageal reflux.   Spasms of the stomach wall.  Complications can include:  Bacterial overgrowth in stomach. Food stays in the stomach and can ferment and cause bacteria to grow.   Weight loss due to difficulty digesting and absorbing nutrients.   Vomiting.   Obstruction in the stomach. Undigested food can harden and cause nausea and vomiting.   Blood glucose fluctuations caused by inconsistent food absorption.  DIAGNOSIS  The diagnosis of gastroparesis is confirmed through one or more of the following tests:  Barium X-rays and scans. These tests look at how long it takes for  food to move through the stomach.   Gastric manometry. This test measures electrical and muscular activity in the stomach. A thin tube is passed down the throat into the stomach. The tube contains a wire that takes measurements of the stomach's electrical and muscular activity as it digests liquids and solid food.   Endoscopy. This procedure is done with a long, thin tube called an endoscope. It is passed through the mouth and gently guides down the esophagus into the stomach. This tube helps the caregiver look at the lining of the stomach to check for any abnormalities.   Ultrasound. This can rule out gallbladder disease or pancreatitis. This test will outline and define the shape of the gallbladder and pancreas.  TREATMENT   The primary treatment is to identify the problem and help control blood glucose levels. Treatments include:   Exercise.   Medicines to control nausea and vomiting.   Medicines to stimulate stomach muscles.   Changes in what and when you eat.   Having smaller meals more often.   Eating low-fiber forms of high-fiber foods, such aseating cooked vegetables instead of raw vegetables.   Eating low-fat foods.   Consuming liquids, which are easier to digest.   In severe cases, feeding tubes and intravenous (IV) feeding may be needed.  It is important to note that in most cases, treatment does not cure gastroparesis. It is usually a lasting (chronic) condition. Treatment helps you manage the condition so that you can be as healthy and comfortable as possible. NEW TREATMENTS  A gastric neurostimulator has been developed to assist people with gastroparesis.  The battery-operated device is surgically implanted. It emits mild electrical pulses to help improve stomach emptying and to control nausea and vomiting.   The use of botulinum toxin has been shown to improve stomach emptying by decreasing the prolonged contractions of the muscle between the stomach and the small  intestine (pyloric sphincter). The benefits are temporary.  SEEK MEDICAL CARE IF:   You are having problems keeping your blood glucose in goal range.   You are having nausea, vomiting, bloating, or early feelings of fullness with eating.   Your symptoms do not change with a change in diet.  Document Released: 08/16/2005 Document Revised: 04/28/2011 Document Reviewed: 01/23/2009 [facility] Patient Information 2012 [place], [place].

## 2011-09-08 NOTE — Progress Notes (Signed)
Primary Care Physician: Cranford Mon  Primary Gastroenterologist:  Roetta Sessions, MD   Chief Complaint  Patient presents with  . Follow-up    HPI: Linda English is a 56 y.o. female here for f/u GERD, IBS, N/V. Last seen at time of EGD. Reactive changes and chronic inflammation likely due to NSAIDS found on gastric bx. Patient denies further vomiting but some nausea. Stopped Victoza b/c potential side effect of delayed gastric emptying per drug insert. Still with some nausea. Constant sounds of regurgitation but doesn't come up into mouth. Avoiding meals at times. BM irregular. Scared to take Miralax on daily basis b/c intermittent diarrhea. When finally goes then multiple stools daily. Denies abdominal pain.  Current Outpatient Prescriptions  Medication Sig Dispense Refill  . amLODipine-benazepril (LOTREL) 5-20 MG per capsule Take 1 capsule by mouth daily.       . Armodafinil (NUVIGIL) 50 MG tablet Take 50 mg by mouth daily.      Marland Kitchen aspirin EC 81 MG tablet Take 81 mg by mouth daily.        Marland Kitchen buPROPion (WELLBUTRIN XL) 150 MG 24 hr tablet Take 150 mg by mouth daily.       . clonazePAM (KLONOPIN) 1 MG tablet Take 1 mg by mouth Every 6 hours as needed. For anxiety      . CYMBALTA 60 MG capsule Take 60 mg by mouth daily.       . folic acid (FOLVITE) 1 MG tablet Take 1 mg by mouth daily.       Marland Kitchen glipiZIDE (GLUCOTROL) 10 MG 24 hr tablet Take 10 mg by mouth daily.       . metFORMIN (GLUCOPHAGE-XR) 500 MG 24 hr tablet Take 1,000 mg by mouth daily.        . methotrexate (RHEUMATREX) 2.5 MG tablet Take 7.5 mg by mouth once a week. On Wednesday      . mirtazapine (REMERON) 15 MG tablet Take 15 mg by mouth daily.       Marland Kitchen NEXIUM 40 MG capsule Take 40 mg by mouth 2 (two) times daily.       . ONE TOUCH ULTRA TEST test strip 1 strip by Does not apply route Once daily as needed.      . traMADol-acetaminophen (ULTRACET) 37.5-325 MG per tablet Take 1 tablet by mouth every 4 (four) hours as needed.         . Liraglutide (VICTOZA) 18 MG/3ML SOLN Inject 1.2 mg into the skin daily.        Current Facility-Administered Medications  Medication Dose Route Frequency Provider Last Rate Last Dose  . methylPREDNISolone acetate (DEPO-MEDROL) injection 40 mg  40 mg Intra-articular Once Fuller Canada, MD      . methylPREDNISolone acetate (DEPO-MEDROL) injection 40 mg  40 mg Intra-articular Once Fuller Canada, MD        Allergies as of 09/08/2011  . (No Known Allergies)    ROS:  General: Negative for anorexia, weight loss, fever, chills, fatigue, weakness. ENT: Negative for hoarseness, difficulty swallowing , nasal congestion. CV: Negative for chest pain, angina, palpitations, dyspnea on exertion, peripheral edema.  Respiratory: Negative for dyspnea at rest, dyspnea on exertion, cough, sputum, wheezing.  GI: See history of present illness. GU:  Negative for dysuria, hematuria, urinary incontinence, urinary frequency, nocturnal urination.  Endo: Negative for unusual weight change.    Physical Examination:   BP 149/80  Pulse 101  Temp(Src) 97.2 F (36.2 C) (Temporal)  Ht 5\' 4"  (1.626 m)  Wt 190 lb 9.6 oz (86.456 kg)  BMI 32.72 kg/m2  General: Well-nourished, well-developed in no acute distress. Obese.  Eyes: No icterus. Mouth: Oropharyngeal mucosa moist and pink , no lesions erythema or exudate. Lungs: Clear to auscultation bilaterally.  Heart: Regular rate and rhythm, no murmurs rubs or gallops.  Abdomen: Bowel sounds are normal, nontender, nondistended, no hepatosplenomegaly or masses, no abdominal bruits or hernia , no rebound or guarding.   Extremities: No lower extremity edema. No clubbing or deformities. Neuro: Alert and oriented x 4   Skin: Warm and dry, no jaundice.   Psych: Alert and cooperative, normal mood and affect.

## 2011-09-09 NOTE — Assessment & Plan Note (Signed)
Persistent alternating constipation and diarrhea. She is afraid to treat constipation with miralax. Add fiber gummies two daily, 30 days supply given. Add Align one daily, #28 samples provided.

## 2011-09-09 NOTE — Assessment & Plan Note (Signed)
Stable. Continue Nexium. Antireflux measures.

## 2011-09-09 NOTE — Assessment & Plan Note (Signed)
No longer vomiting. ?atypical gerd or gastroparesis. Trial of gastroparesis diet. Antireflux measures. OV prn.

## 2011-09-13 NOTE — Progress Notes (Signed)
Faxed to PCP

## 2012-04-04 ENCOUNTER — Emergency Department (HOSPITAL_COMMUNITY): Payer: BC Managed Care – PPO

## 2012-04-04 ENCOUNTER — Emergency Department (HOSPITAL_COMMUNITY)
Admission: EM | Admit: 2012-04-04 | Discharge: 2012-04-04 | Disposition: A | Discharge status: Home / Self Care | Payer: BC Managed Care – PPO | Attending: Emergency Medicine | Admitting: Emergency Medicine

## 2012-04-04 ENCOUNTER — Encounter (HOSPITAL_COMMUNITY): Payer: Self-pay | Admitting: *Deleted

## 2012-04-04 DIAGNOSIS — R112 Nausea with vomiting, unspecified: Secondary | ICD-10-CM

## 2012-04-04 DIAGNOSIS — K219 Gastro-esophageal reflux disease without esophagitis: Secondary | ICD-10-CM | POA: Insufficient documentation

## 2012-04-04 DIAGNOSIS — R079 Chest pain, unspecified: Secondary | ICD-10-CM

## 2012-04-04 DIAGNOSIS — E119 Type 2 diabetes mellitus without complications: Secondary | ICD-10-CM | POA: Insufficient documentation

## 2012-04-04 DIAGNOSIS — F329 Major depressive disorder, single episode, unspecified: Secondary | ICD-10-CM | POA: Insufficient documentation

## 2012-04-04 DIAGNOSIS — F3289 Other specified depressive episodes: Secondary | ICD-10-CM | POA: Insufficient documentation

## 2012-04-04 DIAGNOSIS — K3184 Gastroparesis: Secondary | ICD-10-CM

## 2012-04-04 DIAGNOSIS — R634 Abnormal weight loss: Secondary | ICD-10-CM | POA: Insufficient documentation

## 2012-04-04 DIAGNOSIS — Z79899 Other long term (current) drug therapy: Secondary | ICD-10-CM | POA: Insufficient documentation

## 2012-04-04 DIAGNOSIS — F411 Generalized anxiety disorder: Secondary | ICD-10-CM | POA: Insufficient documentation

## 2012-04-04 DIAGNOSIS — E876 Hypokalemia: Secondary | ICD-10-CM

## 2012-04-04 DIAGNOSIS — R1310 Dysphagia, unspecified: Secondary | ICD-10-CM

## 2012-04-04 DIAGNOSIS — R0602 Shortness of breath: Secondary | ICD-10-CM | POA: Insufficient documentation

## 2012-04-04 DIAGNOSIS — I10 Essential (primary) hypertension: Secondary | ICD-10-CM | POA: Insufficient documentation

## 2012-04-04 LAB — COMPREHENSIVE METABOLIC PANEL
ALT: 16 U/L (ref 0–35)
Alkaline Phosphatase: 126 U/L — ABNORMAL HIGH (ref 39–117)
CO2: 26 mEq/L (ref 19–32)
Calcium: 10.2 mg/dL (ref 8.4–10.5)
Chloride: 100 mEq/L (ref 96–112)
GFR calc Af Amer: 77 mL/min — ABNORMAL LOW (ref >90)
GFR calc non Af Amer: 67 mL/min — ABNORMAL LOW (ref >90)
Glucose, Bld: 92 mg/dL (ref 70–99)
Sodium: 140 mEq/L (ref 135–145)
Total Bilirubin: 0.3 mg/dL (ref 0.3–1.2)

## 2012-04-04 LAB — CBC WITH DIFFERENTIAL/PLATELET
Eosinophils Relative: 2 % (ref 0–5)
HCT: 40.9 % (ref 36.0–46.0)
Lymphocytes Relative: 55 % — ABNORMAL HIGH (ref 12–46)
Lymphs Abs: 2.5 10*3/uL (ref 0.7–4.0)
MCV: 79.4 fL (ref 78.0–100.0)
Monocytes Absolute: 0.4 10*3/uL (ref 0.1–1.0)
Platelets: 351 10*3/uL (ref 150–400)
RBC: 5.15 MIL/uL — ABNORMAL HIGH (ref 3.87–5.11)
WBC: 4.5 10*3/uL (ref 4.0–10.5)

## 2012-04-04 MED ORDER — DIGOXIN 0.25 MG/ML IJ SOLN
INTRAMUSCULAR | Status: AC
  Filled 2012-04-04: qty 2

## 2012-04-04 MED ORDER — POTASSIUM CHLORIDE CRYS ER 20 MEQ PO TBCR: 20.0000 meq | EXTENDED_RELEASE_TABLET | Freq: Two times a day (BID) | ORAL | Status: DC

## 2012-04-04 MED ORDER — NITROGLYCERIN 2 % TD OINT
1.0000 [in_us] | TOPICAL_OINTMENT | Freq: Once | TRANSDERMAL | Status: AC
  Administered 2012-04-04: 1 [in_us] via TOPICAL
  Filled 2012-04-04: qty 1

## 2012-04-04 MED ORDER — FAMOTIDINE IN NACL 20-0.9 MG/50ML-% IV SOLN
20.0000 mg | Freq: Once | INTRAVENOUS | Status: AC
  Administered 2012-04-04: 20 mg via INTRAVENOUS
  Filled 2012-04-04: qty 50

## 2012-04-04 MED ORDER — PROMETHAZINE HCL 25 MG PO TABS: 25.0000 mg | ORAL_TABLET | Freq: Four times a day (QID) | ORAL | Status: DC | PRN

## 2012-04-04 MED ORDER — METOCLOPRAMIDE HCL 5 MG/ML IJ SOLN
10.0000 mg | Freq: Once | INTRAMUSCULAR | Status: AC
  Administered 2012-04-04: 10 mg via INTRAVENOUS
  Filled 2012-04-04: qty 2

## 2012-04-04 MED ORDER — POTASSIUM CHLORIDE CRYS ER 20 MEQ PO TBCR
40.0000 meq | EXTENDED_RELEASE_TABLET | Freq: Once | ORAL | Status: AC
  Administered 2012-04-04: 40 meq via ORAL
  Filled 2012-04-04: qty 2

## 2012-04-04 MED ORDER — METOCLOPRAMIDE HCL 10 MG PO TABS: 10.0000 mg | ORAL_TABLET | Freq: Four times a day (QID) | ORAL | Status: DC

## 2012-04-04 MED ORDER — DIPHENHYDRAMINE HCL 50 MG/ML IJ SOLN
25.0000 mg | Freq: Once | INTRAMUSCULAR | Status: AC
  Administered 2012-04-04: 25 mg via INTRAVENOUS
  Filled 2012-04-04 (×2): qty 1

## 2012-04-04 MED ORDER — SODIUM CHLORIDE 0.9 % IV SOLN
Freq: Once | INTRAVENOUS | Status: AC
  Administered 2012-04-04: 14:00:00 via INTRAVENOUS

## 2012-04-04 NOTE — ED Provider Notes (Cosign Needed)
History    This chart was scribed for Ward Givens, MD, MD by Smitty Pluck. The patient was seen in room APA01 and the patient's care was started at 1:36PM.   CSN: [id number]  Arrival date & time 04/04/12  1202   First MD Initiated Contact with Patient 04/04/12 1303      Chief Complaint  Patient presents with  . Shortness of Breath    (Consider location/radiation/quality/duration/timing/severity/associated sxs/prior treatment) The history is provided by the patient.   Linda English is a 56 y.o. female who presents to the Emergency Department complaining of moderate, intermittent  chest pain, abdominal pain and nausea onset 1 week ago. Pt reports having difficulty swallowing food onset 1 week ago. Pt reports when she eats, she swallows very slowly. Pt reports hx of similar symptoms with nausea within last 6 months. Pt reports that she has dull mid chest pain that lasts 30 minutes. Pt reports that she feels hot and dizzy during episodes of chest pain. Chest pain is aggravated by movement and exertion. Abdominal pain is burning sensation. Abdominal pain lasts about 30 minutes.   Reports hx of diabetes (9.5 years). Dr. Jena Gauss performed endoscopy within last 6 months and was told it was because of diabetes medication. When she stopped that medication symptoms subsided. She reports decreased appetite and feeling full after small amounts of food. Pt reports that she has had weight loss (she lost 20 pounds and has decreased 2 sizes in clothing within last couple of months since seeing Dr. Jena Gauss). Pt has hx of IBS and acid reflux. She reports that acid reflux is worse at night. Denies smoking cigarettes and drinking alcohol.     Pt has hx of cholecystectomy and colon cancer.    Dr. Ouida Sills is PCP   Past Medical History  Diagnosis Date  . Anxiety   . Allergic rhinitis   . Acid reflux   . IBS (irritable bowel syndrome)   . Depression   . Panic disorder   . Chronic headache   . Rectal  cancer   . DM (diabetes mellitus)   . Hypertension   . Obstructive sleep apnea   . Lichen planus   . Arthritis     osteoarthritis    Past Surgical History  Procedure Date  . Spine surgery 2001    L5,S1 HEMILAMINOTOMY AND DISCECTOMY/DR DEATON  . Cholecystectomy   . Low anterior rection 2003    RECTAL CANCER  . Cesarean section     X  2  . Appendectomy   . Colonoscopy 08/2007    DR Jena Gauss, friable anal canal hemorrhoids, surgical anastomosis at 3cm, distal scattered tics  . Esophagogastroduodenoscopy 05/2002    DR Jena Gauss, normal  . Colonoscopy 12/15/2010    anal papilla and internal hemorrhoids/diminutive polyp in the base of the cecum. Past, tubular adenoma.. Next colonoscopy due in April 2017.  . Abdominal hysterectomy   . Esophagogastroduodenoscopy 08/03/2011    Procedure: ESOPHAGOGASTRODUODENOSCOPY (EGD);  Surgeon: Corbin Ade, MD; gastric polyps, small hh, gastric erosions, bx reactive changes with slight chroinc inflammation likely due to NSAIDS or other mucosal irritants. No H.Pylori.    Family History  Problem Relation Age of Onset  . Colon cancer Paternal Grandfather 21  . Colon polyps Brother   . Colon polyps Cousin   Pt has family hx of heart problems. Pt's father had hx of MI and passed due to heart complications at age 80. She reports several aunts (paternal and maternal). Pt's mom is  borderline diabetes.     History  Substance Use Topics  . Smoking status: Never Smoker   . Smokeless tobacco: Never Used  . Alcohol Use: No  employed Lives with daughters  OB History    Grav Para Term Preterm Abortions TAB SAB Ect Mult Living                  Review of Systems  All other systems reviewed and are negative.   10 Systems reviewed and all are negative for acute change except as noted in the HPI.   Allergies  Review of patient's allergies indicates no known allergies.  Home Medications   Current Outpatient Rx  Name Route Sig Dispense Refill  .  AMLODIPINE BESY-BENAZEPRIL HCL 5-20 MG PO CAPS Oral Take 1 capsule by mouth daily.     . AMPHETAMINE-DEXTROAMPHET ER 30 MG PO CP24 Oral Take 30 mg by mouth daily.    . ARMODAFINIL 50 MG PO TABS Oral Take 50 mg by mouth daily.    . BUPROPION HCL ER (XL) 150 MG PO TB24 Oral Take 150 mg by mouth daily.     Marland Kitchen CLONAZEPAM 1 MG PO TABS Oral Take 1 mg by mouth Every 6 hours as needed. For anxiety    . CYMBALTA 60 MG PO CPEP Oral Take 60 mg by mouth daily.     Marland Kitchen FOLIC ACID 1 MG PO TABS Oral Take 1 mg by mouth daily.     Marland Kitchen GLIPIZIDE ER 10 MG PO TB24 Oral Take 10 mg by mouth daily.     Marland Kitchen METFORMIN HCL ER 500 MG PO TB24 Oral Take 1,000 mg by mouth daily.      Marland Kitchen METHOTREXATE 2.5 MG PO TABS Oral Take 7.5 mg by mouth once a week. On Wednesday    . MIRTAZAPINE 15 MG PO TABS Oral Take 15 mg by mouth at bedtime.     Marland Kitchen NEXIUM 40 MG PO CPDR Oral Take 40 mg by mouth 2 (two) times daily.       BP 155/90  Pulse 101  Temp 98.3 F (36.8 C) (Oral)  Resp 20  SpO2 100%  Vital signs normal except tachycardia and mild hyper tension   Physical Exam  Nursing note and vitals reviewed. Constitutional: She is oriented to person, place, and time. She appears well-developed and well-nourished.  Non-toxic appearance. She does not appear ill. No distress.  HENT:  Head: Normocephalic and atraumatic.  Right Ear: External ear normal.  Left Ear: External ear normal.  Nose: Nose normal. No mucosal edema or rhinorrhea.  Mouth/Throat: Mucous membranes are normal. No dental abscesses or uvula swelling.       Tongue is dry  Eyes: Conjunctivae and EOM are normal. Pupils are equal, round, and reactive to light.  Neck: Normal range of motion and full passive range of motion without pain. Neck supple.  Cardiovascular: Normal rate, regular rhythm and normal heart sounds.  Exam reveals no gallop and no friction rub.   No murmur heard. Pulmonary/Chest: Effort normal and breath sounds normal. No respiratory distress. She has no  wheezes. She has no rhonchi. She has no rales. She exhibits tenderness (mild left upper chest wall). She exhibits no crepitus.  Abdominal: Soft. Normal appearance and bowel sounds are normal. She exhibits no distension. There is tenderness (epigastric). There is no rebound and no guarding.         Diminished bowel sounds  Musculoskeletal: Normal range of motion. She exhibits no edema and no  tenderness.       Moves all extremities well.   Neurological: She is alert and oriented to person, place, and time. She has normal strength. No cranial nerve deficit.  Skin: Skin is warm, dry and intact. No rash noted. No erythema. No pallor.  Psychiatric: She has a normal mood and affect. Her speech is normal and behavior is normal. Her mood appears not anxious.    ED Course  Procedures (including critical care time)   Medications  amphetamine-dextroamphetamine (ADDERALL XR) 30 MG 24 hr capsule (not administered)  digoxin (LANOXIN) 0.25 MG/ML injection (   Not Given 04/04/12 1453)  metoCLOPramide (REGLAN) 10 MG tablet (not administered)  promethazine (PHENERGAN) 25 MG tablet (not administered)  potassium chloride SA (K-DUR,KLOR-CON) 20 MEQ tablet (not administered)  famotidine (PEPCID) IVPB 20 mg (0 mg Intravenous Stopped 04/04/12 1500)  nitroGLYCERIN (NITROGLYN) 2 % ointment 1 inch (1 inch Topical Given 04/04/12 1422)  metoCLOPramide (REGLAN) injection 10 mg (10 mg Intravenous Given 04/04/12 1423)  diphenhydrAMINE (BENADRYL) injection 25 mg (25 mg Intravenous Given 04/04/12 1428)  0.9 %  sodium chloride infusion (0  Intravenous Stopped 04/04/12 1714)    17:00 pt is feeling better. States she has an appt on the 12th with her doctor.   DIAGNOSTIC STUDIES: Oxygen Saturation is 100% on room air, normal by my interpretation.    COORDINATION OF CARE: 1:53PM EDP discusses pt ED treatment with pt  1:03PM EDP ordered medication:    Results for orders placed during the hospital encounter of 04/04/12  GLUCOSE,  CAPILLARY      Component Value Range   Glucose-Capillary 81  70 - 99 mg/dL  CBC WITH DIFFERENTIAL      Component Value Range   WBC 4.5  4.0 - 10.5 K/uL   RBC 5.15 (*) 3.87 - 5.11 MIL/uL   Hemoglobin 13.6  12.0 - 15.0 g/dL   HCT 86.5  78.4 - 69.6 %   MCV 79.4  78.0 - 100.0 fL   MCH 26.4  26.0 - 34.0 pg   MCHC 33.3  30.0 - 36.0 g/dL   RDW 29.5  28.4 - 13.2 %   Platelets 351  150 - 400 K/uL   Neutrophils Relative 34 (*) 43 - 77 %   Neutro Abs 1.5 (*) 1.7 - 7.7 K/uL   Lymphocytes Relative 55 (*) 12 - 46 %   Lymphs Abs 2.5  0.7 - 4.0 K/uL   Monocytes Relative 9  3 - 12 %   Monocytes Absolute 0.4  0.1 - 1.0 K/uL   Eosinophils Relative 2  0 - 5 %   Eosinophils Absolute 0.1  0.0 - 0.7 K/uL   Basophils Relative 0  0 - 1 %   Basophils Absolute 0.0  0.0 - 0.1 K/uL  COMPREHENSIVE METABOLIC PANEL      Component Value Range   Sodium 140  135 - 145 mEq/L   Potassium 3.1 (*) 3.5 - 5.1 mEq/L   Chloride 100  96 - 112 mEq/L   CO2 26  19 - 32 mEq/L   Glucose, Bld 92  70 - 99 mg/dL   BUN 10  6 - 23 mg/dL   Creatinine, Ser 4.40  0.50 - 1.10 mg/dL   Calcium 10.2  8.4 - 72.5 mg/dL   Total Protein 8.0  6.0 - 8.3 g/dL   Albumin 4.3  3.5 - 5.2 g/dL   AST 19  0 - 37 U/L   ALT 16  0 - 35 U/L  Alkaline Phosphatase 126 (*) 39 - 117 U/L   Total Bilirubin 0.3  0.3 - 1.2 mg/dL   GFR calc non Af Amer 67 (*) >90 mL/min   GFR calc Af Amer 77 (*) >90 mL/min  TROPONIN I      Component Value Range   Troponin I <0.30  <0.30 ng/mL  LIPASE, BLOOD      Component Value Range   Lipase 23  11 - 59 U/L   Laboratory interpretation all normal except hypokalemia    Dg Chest Portable 1 View  04/04/2012  *RADIOLOGY REPORT*  Clinical Data: Chest pain  PORTABLE CHEST - 1 VIEW  Comparison: Chest x-ray of 03/26/2004  Findings: No active infiltrate or effusion is seen.  Mediastinal contours are stable.  A lower anterior cervical spine fusion plate is present.  The heart is within normal limits in size.  No bony  abnormality is seen.  There are degenerative changes in the mid to lower thoracic spine.  IMPRESSION: No active lung disease.  Original Report Authenticated By: Juline Patch, M.D.    Date: 04/04/2012  Rate: 98  Rhythm: normal sinus rhythm  QRS Axis: normal  Intervals: QT prolonged  ST/T Wave abnormalities: normal  Conduction Disutrbances:none  Narrative Interpretation:   Old EKG Reviewed: unchanged from 03/26/2004     1. Nausea and vomiting   2. Dysphagia   3. Weight loss   4. Gastroparesis   5. Hypokalemia   6. Chest pain   7. GERD (gastroesophageal reflux disease)      New Prescriptions   METOCLOPRAMIDE (REGLAN) 10 MG TABLET    Take 1 tablet (10 mg total) by mouth every 6 (six) hours.   POTASSIUM CHLORIDE SA (K-DUR,KLOR-CON) 20 MEQ TABLET    Take 1 tablet (20 mEq total) by mouth 2 (two) times daily.   PROMETHAZINE (PHENERGAN) 25 MG TABLET    Take 1 tablet (25 mg total) by mouth every 6 (six) hours as needed for nausea.    Plan discharge  Devoria Albe, MD, FACEP    MDM    I personally performed the services described in this documentation, which was scribed in my presence. The recorded information has been reviewed and considered.  Devoria Albe, MD, Armando Gang    Ward Givens, MD 04/04/12 [phone]

## 2012-04-04 NOTE — ED Notes (Signed)
Multiple complaints which began a week ago.  Intermittent SOB, unable to eat well, nausea, difficulty swallowing, sweating bad, face and hands tingling, pain in legs an darms, weak, feet and hands cold, left foot numb, dull pain to chest at times. > copied from piece of paper pt brought in with her. NAD at this time. No visible SOB or diaphoresis.

## 2012-04-07 ENCOUNTER — Encounter: Payer: Self-pay | Admitting: Internal Medicine

## 2012-04-10 ENCOUNTER — Ambulatory Visit (INDEPENDENT_AMBULATORY_CARE_PROVIDER_SITE_OTHER): Payer: BC Managed Care – PPO | Admitting: Gastroenterology

## 2012-04-10 ENCOUNTER — Encounter: Payer: Self-pay | Admitting: Gastroenterology

## 2012-04-10 VITALS — BP 134/83 | HR 113 | Temp 98.5°F | Ht 62.0 in | Wt 179.6 lb

## 2012-04-10 DIAGNOSIS — R1310 Dysphagia, unspecified: Secondary | ICD-10-CM | POA: Insufficient documentation

## 2012-04-10 DIAGNOSIS — R1013 Epigastric pain: Secondary | ICD-10-CM

## 2012-04-10 DIAGNOSIS — R634 Abnormal weight loss: Secondary | ICD-10-CM

## 2012-04-10 DIAGNOSIS — R1319 Other dysphagia: Secondary | ICD-10-CM | POA: Insufficient documentation

## 2012-04-10 DIAGNOSIS — R1314 Dysphagia, pharyngoesophageal phase: Secondary | ICD-10-CM

## 2012-04-10 DIAGNOSIS — R110 Nausea: Secondary | ICD-10-CM

## 2012-04-10 NOTE — Assessment & Plan Note (Addendum)
Intermittent/ongoing symptoms of nausea/dysphagia/epig discomfort/weight loss. Significant dietary restrictions per patient. States she is eating very little due to symptoms. Her weight has dropped from 198lb in 07/2011 to 179 today. Last EGD 8 months ago. At this point, BPE to evaluate dysphagia. GES to evaluate for gastroparesis. She will stop Reglan for now due to potential long-term side effects and for drowsiness. Gastroparesis diet discussed. Further recommendations to follow.  If test are normal, consider checking TSH, free T4, fasting AM cortisol as next step.

## 2012-04-10 NOTE — Progress Notes (Signed)
Faxed to PCP

## 2012-04-10 NOTE — Progress Notes (Signed)
Primary Care Physician: Cranford Mon  Primary Gastroenterologist:  Roetta Sessions, MD   Chief Complaint  Patient presents with  . Dysphagia  . Nausea    HPI: Linda English is a 56 y.o. female here for f/u recent ED visit. Feels like food not going down. Lots of nausea. Eats very little. Tries to hide from 32 year old daughter. Difficulty swallowing liquids/pills/solids. Nausea associated even with liquids. Associated with upper abdominal discomfort. BM unchanged. No melena, brbpr. Issues with anxiety/depression/panic disorder. All symptoms seem to come at same time. Lots of crying with symptoms. Sees Dr. Evelene Croon for anxiety/depression.   Seen in ED recently. CXR unremarkable. Labs okay as below. Started on reglan which seems to help nausea. No longer needing phenergan. C/o bad drowsiness with reglan.    Current Outpatient Prescriptions  Medication Sig Dispense Refill  . amLODipine-benazepril (LOTREL) 5-20 MG per capsule Take 1 capsule by mouth daily.       Marland Kitchen amphetamine-dextroamphetamine (ADDERALL XR) 30 MG 24 hr capsule Take 30 mg by mouth daily.      Marland Kitchen buPROPion (WELLBUTRIN XL) 150 MG 24 hr tablet Take 150 mg by mouth daily.       . clonazePAM (KLONOPIN) 1 MG tablet Take 1 mg by mouth Every 6 hours as needed. For anxiety      . CYMBALTA 30 MG capsule Take 30 mg by mouth daily.      . folic acid (FOLVITE) 1 MG tablet Take 1 mg by mouth daily.       Marland Kitchen glipiZIDE (GLUCOTROL) 10 MG 24 hr tablet Take 10 mg by mouth daily.       . metFORMIN (GLUCOPHAGE-XR) 500 MG 24 hr tablet Take 1,000 mg by mouth daily.        . methotrexate (RHEUMATREX) 2.5 MG tablet Take 7.5 mg by mouth once a week. On Wednesday      . metoCLOPramide (REGLAN) 10 MG tablet Take 1 tablet (10 mg total) by mouth every 6 (six) hours.  40 tablet  0  . mirtazapine (REMERON) 15 MG tablet Take 15 mg by mouth at bedtime.       Marland Kitchen NEXIUM 40 MG capsule Take 40 mg by mouth 2 (two) times daily.       Marland Kitchen NUVIGIL 250 MG tablet Take 250  mg by mouth daily.      . potassium chloride SA (K-DUR,KLOR-CON) 20 MEQ tablet Take 1 tablet (20 mEq total) by mouth 2 (two) times daily.  10 tablet  0  . promethazine (PHENERGAN) 25 MG tablet Take 1 tablet (25 mg total) by mouth every 6 (six) hours as needed for nausea.  10 tablet  0      Allergies as of 04/10/2012  . (No Known Allergies)    ROS:  General: Negative for anorexia, weight loss, fever, chills, fatigue, weakness. ENT: Negative for hoarseness, difficulty swallowing , nasal congestion. CV: Negative for chest pain, angina, palpitations, dyspnea on exertion, peripheral edema.  Respiratory: Negative for dyspnea at rest, dyspnea on exertion, cough, sputum, wheezing.  GI: See history of present illness. GU:  Negative for dysuria, hematuria, urinary incontinence, urinary frequency, nocturnal urination.  Endo: Negative for unusual weight change.  Psych: see hpi. No suicidal or homicidal ideations.    Physical Examination:   BP 134/83  Pulse 113  Temp 98.5 F (36.9 C) (Temporal)  Ht 5\' 2"  (1.575 m)  Wt 179 lb 9.6 oz (81.466 kg)  BMI 32.85 kg/m2  General: Well-nourished, well-developed in no acute  distress.  Eyes: No icterus. Mouth: Oropharyngeal mucosa moist and pink , no lesions erythema or exudate. Lungs: Clear to auscultation bilaterally.  Heart: Regular rate and rhythm, no murmurs rubs or gallops.  Abdomen: Bowel sounds are normal, nontender, nondistended, no hepatosplenomegaly or masses, no abdominal bruits or hernia , no rebound or guarding.   Extremities: No lower extremity edema. No clubbing or deformities. Neuro: Alert and oriented x 4   Skin: Warm and dry, no jaundice.   Psych: Alert and cooperative, normal mood and affect.  Labs:  Lab Results  Component Value Date   WBC 4.5 04/04/2012   HGB 13.6 04/04/2012   HCT 40.9 04/04/2012   MCV 79.4 04/04/2012   PLT 351 04/04/2012   Lab Results  Component Value Date   CREATININE 0.95 04/04/2012   BUN 10 04/04/2012   NA 140  04/04/2012   K 3.1* 04/04/2012   CL 100 04/04/2012   CO2 26 04/04/2012   Lab Results  Component Value Date   ALT 16 04/04/2012   AST 19 04/04/2012   ALKPHOS 126* 04/04/2012   BILITOT 0.3 04/04/2012    Imaging Studies: Dg Chest Portable 1 View  04/04/2012  *RADIOLOGY REPORT*  Clinical Data: Chest pain  PORTABLE CHEST - 1 VIEW  Comparison: Chest x-ray of 03/26/2004  Findings: No active infiltrate or effusion is seen.  Mediastinal contours are stable.  A lower anterior cervical spine fusion plate is present.  The heart is within normal limits in size.  No bony abnormality is seen.  There are degenerative changes in the mid to lower thoracic spine.  IMPRESSION: No active lung disease.  Original Report Authenticated By: Juline Patch, M.D.

## 2012-04-10 NOTE — Patient Instructions (Addendum)
Stop Reglan.  We have scheduled you for a barium pill esophagram and a gastric emptying study. Other recommendations to follow these tests.  In the meantime, concentrate on consuming small snacks 5-6 times daily rather than large meals. Foods that'll be easier on your system would be low fat items.

## 2012-04-14 ENCOUNTER — Encounter (HOSPITAL_COMMUNITY)
Admission: RE | Admit: 2012-04-14 | Discharge: 2012-04-14 | Disposition: A | Discharge status: Home / Self Care | Payer: BC Managed Care – PPO | Source: Ambulatory Visit | Attending: Gastroenterology | Admitting: Gastroenterology

## 2012-04-14 ENCOUNTER — Encounter (HOSPITAL_COMMUNITY): Payer: Self-pay

## 2012-04-14 DIAGNOSIS — R1013 Epigastric pain: Secondary | ICD-10-CM | POA: Insufficient documentation

## 2012-04-14 DIAGNOSIS — R1319 Other dysphagia: Secondary | ICD-10-CM

## 2012-04-14 DIAGNOSIS — R1310 Dysphagia, unspecified: Secondary | ICD-10-CM

## 2012-04-14 DIAGNOSIS — R110 Nausea: Secondary | ICD-10-CM

## 2012-04-14 DIAGNOSIS — R1314 Dysphagia, pharyngoesophageal phase: Secondary | ICD-10-CM | POA: Insufficient documentation

## 2012-04-14 DIAGNOSIS — R634 Abnormal weight loss: Secondary | ICD-10-CM | POA: Insufficient documentation

## 2012-04-14 MED ORDER — TECHNETIUM TC 99M SULFUR COLLOID
2.0000 | Freq: Once | INTRAVENOUS | Status: AC | PRN
Start: 2012-04-14 — End: 2012-04-14
  Administered 2012-04-14: 2 via ORAL

## 2012-04-14 NOTE — Progress Notes (Signed)
Quick Note:  No evidence of delayed gastric emptying. Therefore, I would advise not to take Reglan. Await BPE.   ______

## 2012-04-20 ENCOUNTER — Ambulatory Visit (HOSPITAL_COMMUNITY)
Admission: RE | Admit: 2012-04-20 | Discharge: 2012-04-20 | Disposition: A | Discharge status: Home / Self Care | Payer: BC Managed Care – PPO | Source: Ambulatory Visit | Attending: Gastroenterology | Admitting: Gastroenterology

## 2012-04-20 DIAGNOSIS — R1319 Other dysphagia: Secondary | ICD-10-CM

## 2012-04-20 DIAGNOSIS — R110 Nausea: Secondary | ICD-10-CM

## 2012-04-20 DIAGNOSIS — R112 Nausea with vomiting, unspecified: Secondary | ICD-10-CM | POA: Insufficient documentation

## 2012-04-20 DIAGNOSIS — R1310 Dysphagia, unspecified: Secondary | ICD-10-CM

## 2012-04-20 DIAGNOSIS — R1013 Epigastric pain: Secondary | ICD-10-CM | POA: Insufficient documentation

## 2012-04-20 DIAGNOSIS — R634 Abnormal weight loss: Secondary | ICD-10-CM | POA: Insufficient documentation

## 2012-04-20 DIAGNOSIS — K224 Dyskinesia of esophagus: Secondary | ICD-10-CM | POA: Insufficient documentation

## 2012-04-21 NOTE — Progress Notes (Signed)
Quick Note:  Tried to call pt- left message on voicemail. ______

## 2012-04-24 ENCOUNTER — Encounter: Payer: Self-pay | Admitting: Internal Medicine

## 2012-04-24 ENCOUNTER — Other Ambulatory Visit: Payer: Self-pay

## 2012-04-24 DIAGNOSIS — R634 Abnormal weight loss: Secondary | ICD-10-CM

## 2012-04-24 NOTE — Progress Notes (Signed)
Quick Note:  She has no esophageal stricture. Her esophagus muscles are not working together to propel food downward. EGD with dilation does not help this problem.  Recommend she chew food thoroughly. Drink plenty of liquid when eating and taking medication. Sit upright for thirty minutes after taking meds or eating.  For weight loss, I also recommend: TSH, free T4, fasting AM cortisol . Needs OV with RMR only in the next 4-6 weeks. If no better at that time, then consider sending for esophageal manometry. ______

## 2012-04-25 ENCOUNTER — Ambulatory Visit: Payer: BC Managed Care – PPO | Admitting: Internal Medicine

## 2012-04-28 LAB — TSH: TSH: 1.312 u[IU]/mL (ref 0.350–4.500)

## 2012-04-28 NOTE — Progress Notes (Signed)
Quick Note:  Pt aware, she has ov with RMR on 05/23/12 at 9am and she is aware of appt. ______

## 2012-04-28 NOTE — Progress Notes (Signed)
Quick Note:  Please let pt know her thyroid and adrenal gland function looks normal. OV with RMR as planned. ______

## 2012-05-23 ENCOUNTER — Encounter: Payer: Self-pay | Admitting: Internal Medicine

## 2012-05-23 ENCOUNTER — Telehealth: Payer: Self-pay | Admitting: Internal Medicine

## 2012-05-23 ENCOUNTER — Ambulatory Visit (INDEPENDENT_AMBULATORY_CARE_PROVIDER_SITE_OTHER): Payer: BC Managed Care – PPO | Admitting: Internal Medicine

## 2012-05-23 VITALS — BP 146/80 | HR 83 | Temp 97.4°F | Ht 62.0 in | Wt 176.8 lb

## 2012-05-23 DIAGNOSIS — R634 Abnormal weight loss: Secondary | ICD-10-CM | POA: Insufficient documentation

## 2012-05-23 DIAGNOSIS — R630 Anorexia: Secondary | ICD-10-CM

## 2012-05-23 DIAGNOSIS — G4700 Insomnia, unspecified: Secondary | ICD-10-CM | POA: Insufficient documentation

## 2012-05-23 NOTE — Patient Instructions (Signed)
Adhere to a diabetic diet  See Dr. Evelene Croon ASAP  See Dr. Ouida Sills in next couple of weeks  OV here in 1 month

## 2012-05-23 NOTE — Progress Notes (Signed)
Primary Care Physician:  Cranford Mon Primary Gastroenterologist:  Dr. Jena Gauss  Pre-Procedure History & Physical: HPI:  Linda English is a 56 y.o. female here for followup. Nausea and dysphagia have resolved. Came off Victorzas and  feels that coming off this agent helped resolve her GI symptoms. She states she is "manic" from time to time. Recently relates an episode of going over a week with no sleep or food. States she never got hungry. She denies diagnosis of bipolar. Denies suicidal ideation. Not going to see her psychiatrist for another month although that appointment has been moved up from 2 months from now. Saw Dr. Ouida Sills earlier  this month. Not slated back for 4 more months with him. No nausea vomiting abdominal pain melena or hematochezia. Due for surveillance colonoscopy in about 4 years (history of rectal cancer). Recent esophagram demonstrated dysmotility but no obstruction. Recent gastric emptying  study also normal. Free T4 TSH also normal. Reglan discontinued.  Past Medical History  Diagnosis Date  . Anxiety   . Allergic rhinitis   . Acid reflux   . IBS (irritable bowel syndrome)   . Depression   . Panic disorder   . Chronic headache   . Rectal cancer   . DM (diabetes mellitus)   . Hypertension   . Obstructive sleep apnea   . Lichen planus   . Arthritis     osteoarthritis  . Hiatal hernia   . Gastric erosions   . Diverticula of colon   . Hemorrhoids   . Tubular adenoma   . Gastric polyps     benign     Past Surgical History  Procedure Date  . Spine surgery 2001    L5,S1 HEMILAMINOTOMY AND DISCECTOMY/DR DEATON  . Cholecystectomy   . Low anterior rection 2003    RECTAL CANCER  . Cesarean section     X  2  . Appendectomy   . Colonoscopy 08/2007    DR Jena Gauss, friable anal canal hemorrhoids, surgical anastomosis at 3cm, distal scattered tics  . Esophagogastroduodenoscopy 05/2002    DR Jena Gauss, normal  . Colonoscopy 12/15/2010    anal papilla and internal  hemorrhoids/diminutive polyp in the base of the cecum. Past, tubular adenoma.. Next colonoscopy due in April 2017.  . Abdominal hysterectomy   . Esophagogastroduodenoscopy 08/03/2011    small HH/ gastric polyps    Prior to Admission medications   Medication Sig Start Date End Date Taking? Authorizing Provider  amLODipine-benazepril (LOTREL) 5-20 MG per capsule Take 1 capsule by mouth daily.    Yes Historical Provider, MD  amphetamine-dextroamphetamine (ADDERALL XR) 30 MG 24 hr capsule Take 30 mg by mouth daily.   Yes Historical Provider, MD  buPROPion (WELLBUTRIN XL) 150 MG 24 hr tablet Take 150 mg by mouth daily.  11/13/10  Yes Historical Provider, MD  clonazePAM (KLONOPIN) 1 MG tablet Take 1 mg by mouth Every 6 hours as needed. For anxiety 11/14/10  Yes Historical Provider, MD  CYMBALTA 30 MG capsule Take 30 mg by mouth daily. 04/04/12  Yes Historical Provider, MD  folic acid (FOLVITE) 1 MG tablet Take 1 mg by mouth daily.  11/26/10  Yes Historical Provider, MD  glipiZIDE (GLUCOTROL) 10 MG 24 hr tablet Take 10 mg by mouth daily.  11/13/10  Yes Historical Provider, MD  metFORMIN (GLUCOPHAGE-XR) 500 MG 24 hr tablet Take 1,000 mg by mouth daily.     Yes Historical Provider, MD  methotrexate (RHEUMATREX) 2.5 MG tablet Take 7.5 mg by mouth once a  week. On Wednesday 11/16/10  Yes Historical Provider, MD  NEXIUM 40 MG capsule Take 40 mg by mouth 2 (two) times daily.  06/24/11  Yes Historical Provider, MD  NUVIGIL 250 MG tablet Take 250 mg by mouth daily. 04/04/12  Yes Historical Provider, MD  potassium chloride SA (K-DUR,KLOR-CON) 20 MEQ tablet Take 1 tablet (20 mEq total) by mouth 2 (two) times daily. 04/04/12 04/04/13 Yes Ward Givens, MD  mirtazapine (REMERON) 15 MG tablet Take 15 mg by mouth at bedtime.  11/25/10   Historical Provider, MD  promethazine (PHENERGAN) 25 MG tablet Take 1 tablet (25 mg total) by mouth every 6 (six) hours as needed for nausea. 04/04/12 04/11/12  Ward Givens, MD    Allergies as of  05/23/2012  . (No Known Allergies)    Family History  Problem Relation Age of Onset  . Colon cancer Paternal Grandfather 39  . Colon polyps Brother   . Colon polyps Cousin     History   Social History  . Marital Status: Divorced    Spouse Name: N/A    Number of Children: N/A  . Years of Education: N/A   Occupational History  . teacher     [employer]   Social History Main Topics  . Smoking status: Never Smoker   . Smokeless tobacco: Never Used  . Alcohol Use: No  . Drug Use: No  . Sexually Active: No   Other Topics Concern  . Not on file   Social History Narrative  . No narrative on file    Review of Systems: See HPI, otherwise negative ROS  Physical Exam: BP 146/80  Pulse 83  Temp 97.4 F (36.3 C) (Temporal)  Ht 5\' 2"  (1.575 m)  Wt 176 lb 12.8 oz (80.196 kg)  BMI 32.34 kg/m2 General:   Alert,  Well-developed, well-nourished, pleasant and cooperative in NAD Skin:  Intact without significant lesions or rashes. Eyes:  Sclera clear, no icterus.   Conjunctiva pink. Ears:  Normal auditory acuity. Nose:  No deformity, discharge,  or lesions. Mouth:  No deformity or lesions. Neck:  Supple; no masses or thyromegaly. No significant cervical adenopathy. Lungs:  Clear throughout to auscultation.   No wheezes, crackles, or rhonchi. No acute distress. Heart:  Regular rate and rhythm; no murmurs, clicks, rubs,  or gallops. Abdomen: Non-distended, normal bowel sounds.  Soft and nontender without appreciable mass or hepatosplenomegaly.  Pulses:  Normal pulses noted. Extremities:  Without clubbing or edema.  Impression/Plan:  Pleasant 56 year old lady with long periods of insomnia and anorexia more likely  psychiatric in origin. She's lost 3 more pounds since her last visit. She is on multiple psychoactive medications. She's no longer taking Reglan. I called Dr. Ouida Sills and discussed the case with him.  At this time, I told the patient she needs to see her psychiatrist in  an even more expedited fashion. Also recommended she see Dr. Ouida Sills within the next month. I recommend no change in her GI regimen at this time. I recommended that she  plan to consume 3 carbohydrate modified meals daily. Follow up Dr. Ouida Sills and her psychiatrist. We will plan to see this lady back in 3 weeks for a recheck.

## 2012-05-23 NOTE — Telephone Encounter (Signed)
Patient has an appointment to follow up with Dr. Evelene Croon on Thursday October 24th and she is aware

## 2012-05-24 NOTE — Telephone Encounter (Signed)
Good.

## 2012-06-07 ENCOUNTER — Encounter (HOSPITAL_COMMUNITY): Payer: Self-pay | Admitting: Family Medicine

## 2012-06-07 ENCOUNTER — Ambulatory Visit (HOSPITAL_COMMUNITY)
Admission: RE | Admit: 2012-06-07 | Discharge: 2012-06-07 | Disposition: A | Discharge status: Home / Self Care | Payer: BC Managed Care – PPO | Attending: Psychiatry | Admitting: Psychiatry

## 2012-06-07 ENCOUNTER — Emergency Department (HOSPITAL_COMMUNITY)
Admission: EM | Admit: 2012-06-07 | Discharge: 2012-06-08 | Disposition: A | Discharge status: Other Acute Inpatient Hospital | Payer: BC Managed Care – PPO | Attending: Emergency Medicine | Admitting: Emergency Medicine

## 2012-06-07 DIAGNOSIS — K589 Irritable bowel syndrome without diarrhea: Secondary | ICD-10-CM | POA: Insufficient documentation

## 2012-06-07 DIAGNOSIS — F3160 Bipolar disorder, current episode mixed, unspecified: Secondary | ICD-10-CM | POA: Insufficient documentation

## 2012-06-07 DIAGNOSIS — F329 Major depressive disorder, single episode, unspecified: Secondary | ICD-10-CM | POA: Insufficient documentation

## 2012-06-07 DIAGNOSIS — G4733 Obstructive sleep apnea (adult) (pediatric): Secondary | ICD-10-CM | POA: Insufficient documentation

## 2012-06-07 DIAGNOSIS — F259 Schizoaffective disorder, unspecified: Secondary | ICD-10-CM | POA: Insufficient documentation

## 2012-06-07 DIAGNOSIS — F3289 Other specified depressive episodes: Secondary | ICD-10-CM | POA: Insufficient documentation

## 2012-06-07 DIAGNOSIS — M129 Arthropathy, unspecified: Secondary | ICD-10-CM | POA: Insufficient documentation

## 2012-06-07 DIAGNOSIS — E119 Type 2 diabetes mellitus without complications: Secondary | ICD-10-CM | POA: Insufficient documentation

## 2012-06-07 DIAGNOSIS — K219 Gastro-esophageal reflux disease without esophagitis: Secondary | ICD-10-CM | POA: Insufficient documentation

## 2012-06-07 DIAGNOSIS — Z85048 Personal history of other malignant neoplasm of rectum, rectosigmoid junction, and anus: Secondary | ICD-10-CM | POA: Insufficient documentation

## 2012-06-07 DIAGNOSIS — R45851 Suicidal ideations: Secondary | ICD-10-CM

## 2012-06-07 LAB — URINALYSIS, ROUTINE W REFLEX MICROSCOPIC
Leukocytes, UA: NEGATIVE
Nitrite: NEGATIVE
Specific Gravity, Urine: 1.007 (ref 1.005–1.030)
pH: 7 (ref 5.0–8.0)

## 2012-06-07 LAB — CBC WITH DIFFERENTIAL/PLATELET
Basophils Absolute: 0 10*3/uL (ref 0.0–0.1)
HCT: 39.3 % (ref 36.0–46.0)
Lymphocytes Relative: 45 % (ref 12–46)
Neutro Abs: 2.9 10*3/uL (ref 1.7–7.7)
Neutrophils Relative %: 47 % (ref 43–77)
Platelets: 318 10*3/uL (ref 150–400)
RDW: 14.3 % (ref 11.5–15.5)
WBC: 6.2 10*3/uL (ref 4.0–10.5)

## 2012-06-07 LAB — RAPID URINE DRUG SCREEN, HOSP PERFORMED
Benzodiazepines: NOT DETECTED
Cocaine: NOT DETECTED
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
## 2012-06-07 NOTE — ED Notes (Signed)
Bed:[facility]CON<BR> Expected date:<BR> Expected time:<BR> Means of arrival:<BR> Comments:<BR> From [facility]-needs medical clearance

## 2012-06-07 NOTE — ED Notes (Signed)
Opitz, MD at bedside. 

## 2012-06-07 NOTE — ED Notes (Signed)
Patient here from [facility] for medical clearance. Per transfer form, patient cannot contract for safety. She has a plan to overdose on her pills. Patient also hearing voices.

## 2012-06-07 NOTE — ED Provider Notes (Signed)
History     CSN: [id number]  Arrival date & time 06/07/12  2211   First MD Initiated Contact with Patient 06/07/12 2303      Chief Complaint  Patient presents with  . Medical Clearance    (Consider location/radiation/quality/duration/timing/severity/associated sxs/prior treatment) HPI HX per PT and her friend bedside. She has h/o depression and more recently diagnosed with biopolar - has just started lamictal. Over the last few days inc depressions and suicidal thoughts, has had some "conversations in my head" new for her but denies any active hallucinations. No OD or self injury.HAs a psychiatrist.  Micah Flesher to [facility] tonight and referred here for lab work - has a bed being held at this time, pending medical clearance. MOD in severity. No h/o SI or PSY admit.  Past Medical History  Diagnosis Date  . Anxiety   . Allergic rhinitis   . Acid reflux   . IBS (irritable bowel syndrome)   . Depression   . Panic disorder   . Chronic headache   . Rectal cancer   . DM (diabetes mellitus)   . Hypertension   . Obstructive sleep apnea   . Lichen planus   . Arthritis     osteoarthritis  . Hiatal hernia   . Gastric erosions   . Diverticula of colon   . Hemorrhoids   . Tubular adenoma   . Gastric polyps     benign     Past Surgical History  Procedure Date  . Spine surgery 2001    L5,S1 HEMILAMINOTOMY AND DISCECTOMY/DR DEATON  . Cholecystectomy   . Low anterior rection 2003    RECTAL CANCER  . Cesarean section     X  2  . Appendectomy   . Colonoscopy 08/2007    DR Jena Gauss, friable anal canal hemorrhoids, surgical anastomosis at 3cm, distal scattered tics  . Esophagogastroduodenoscopy 05/2002    DR Jena Gauss, normal  . Colonoscopy 12/15/2010    anal papilla and internal hemorrhoids/diminutive polyp in the base of the cecum. Past, tubular adenoma.. Next colonoscopy due in April 2017.  . Abdominal hysterectomy   . Esophagogastroduodenoscopy 08/03/2011    small HH/ gastric polyps    Family  History  Problem Relation Age of Onset  . Colon cancer Paternal Grandfather 68  . Colon polyps Brother   . Colon polyps Cousin     History  Substance Use Topics  . Smoking status: Never Smoker   . Smokeless tobacco: Never Used  . Alcohol Use: No    OB History    Grav Para Term Preterm Abortions TAB SAB Ect Mult Living                  Review of Systems  Constitutional: Negative for fever and chills.  HENT: Negative for neck pain and neck stiffness.   Eyes: Negative for pain.  Respiratory: Negative for shortness of breath.   Cardiovascular: Negative for chest pain.  Gastrointestinal: Negative for abdominal pain.  Genitourinary: Negative for dysuria.  Musculoskeletal: Negative for back pain.  Skin: Negative for rash.  Neurological: Negative for headaches.  Psychiatric/Behavioral: Positive for dysphoric mood.  All other systems reviewed and are negative.    Allergies  Review of patient's allergies indicates no known allergies.  Home Medications   Current Outpatient Rx  Name Route Sig Dispense Refill  . AMLODIPINE BESY-BENAZEPRIL HCL 5-20 MG PO CAPS Oral Take 1 capsule by mouth daily.     . BUPROPION HCL ER (XL) 150 MG PO TB24 Oral  Take 450 mg by mouth daily.     Marland Kitchen CLONAZEPAM 1 MG PO TABS Oral Take 1 mg by mouth Every 6 hours as needed. For anxiety    . GLIPIZIDE ER 10 MG PO TB24 Oral Take 10 mg by mouth daily.     Marland Kitchen LAMOTRIGINE 25 MG PO TABS Oral Take 25 mg by mouth daily. Take 25 mg daily for 2 weeks, then 50 mg daily for 2 weeks, then 100 mg daily for 2 weeks, then 150mg  daily thereafter.    Marland Kitchen METFORMIN HCL ER 500 MG PO TB24 Oral Take 1,000 mg by mouth daily.      Marland Kitchen NEXIUM 40 MG PO CPDR Oral Take 40 mg by mouth 2 (two) times daily.     Marland Kitchen NUVIGIL 250 MG PO TABS Oral Take 250 mg by mouth daily.    Marland Kitchen PRESCRIPTION MEDICATION Topical Apply 1 application topically daily. Daily for PES planus.    Marland Kitchen PROMETHAZINE HCL 25 MG PO TABS Oral Take 1 tablet (25 mg total) by mouth  every 6 (six) hours as needed for nausea. 10 tablet 0    BP 153/96  Pulse 108  Temp 98.6 F (37 C) (Oral)  Resp 22  SpO2 100%  Physical Exam  Nursing note and vitals reviewed. Constitutional: She is oriented to person, place, and time. She appears well-developed and well-nourished.  HENT:  Head: Normocephalic and atraumatic.  Eyes: EOM are normal. Pupils are equal, round, and reactive to light.  Neck: Neck supple.  Cardiovascular: Normal heart sounds and intact distal pulses.   Pulmonary/Chest: Effort normal. No respiratory distress.  Musculoskeletal: Normal range of motion. She exhibits no edema.  Neurological: She is alert and oriented to person, place, and time. No cranial nerve deficit. Coordination normal.  Skin: Skin is warm and dry.  Psychiatric: Thought content normal.       Cooperative and appropriate, depressed affect    ED Course  Procedures (including critical care time)  ACT involved to help arrange transfer when labs are completed and PT is cleared.   Results for orders placed during the hospital encounter of 06/07/12  CBC WITH DIFFERENTIAL      Component Value Range   WBC 6.2  4.0 - 10.5 K/uL   RBC 4.97  3.87 - 5.11 MIL/uL   Hemoglobin 13.3  12.0 - 15.0 g/dL   HCT 40.9  81.1 - 91.4 %   MCV 79.1  78.0 - 100.0 fL   MCH 26.8  26.0 - 34.0 pg   MCHC 33.8  30.0 - 36.0 g/dL   RDW 78.2  95.6 - 21.3 %   Platelets 318  150 - 400 K/uL   Neutrophils Relative 47  43 - 77 %   Neutro Abs 2.9  1.7 - 7.7 K/uL   Lymphocytes Relative 45  12 - 46 %   Lymphs Abs 2.8  0.7 - 4.0 K/uL   Monocytes Relative 6  3 - 12 %   Monocytes Absolute 0.4  0.1 - 1.0 K/uL   Eosinophils Relative 2  0 - 5 %   Eosinophils Absolute 0.1  0.0 - 0.7 K/uL   Basophils Relative 0  0 - 1 %   Basophils Absolute 0.0  0.0 - 0.1 K/uL  URINALYSIS, ROUTINE W REFLEX MICROSCOPIC      Component Value Range   Color, Urine YELLOW  YELLOW   APPearance CLEAR  CLEAR   Specific Gravity, Urine 1.007  1.005 -  1.030   pH 7.0  5.0 - 8.0   Glucose, UA NEGATIVE  NEGATIVE mg/dL   Hgb urine dipstick NEGATIVE  NEGATIVE   Bilirubin Urine NEGATIVE  NEGATIVE   Ketones, ur NEGATIVE  NEGATIVE mg/dL   Protein, ur NEGATIVE  NEGATIVE mg/dL   Urobilinogen, UA 1.0  0.0 - 1.0 mg/dL   Nitrite NEGATIVE  NEGATIVE   Leukocytes, UA NEGATIVE  NEGATIVE  URINE RAPID DRUG SCREEN (HOSP PERFORMED)      Component Value Range   Opiates NONE DETECTED  NONE DETECTED   Cocaine NONE DETECTED  NONE DETECTED   Benzodiazepines NONE DETECTED  NONE DETECTED   Amphetamines NONE DETECTED  NONE DETECTED   Tetrahydrocannabinol NONE DETECTED  NONE DETECTED   Barbiturates NONE DETECTED  NONE DETECTED  ETHANOL      Component Value Range   Alcohol, Ethyl (B) <11  0 - 11 mg/dL  COMPREHENSIVE METABOLIC PANEL      Component Value Range   Sodium 135  135 - 145 mEq/L   Potassium 3.5  3.5 - 5.1 mEq/L   Chloride 98  96 - 112 mEq/L   CO2 25  19 - 32 mEq/L   Glucose, Bld 91  70 - 99 mg/dL   BUN 9  6 - 23 mg/dL   Creatinine, Ser 1.61  0.50 - 1.10 mg/dL   Calcium 9.5  8.4 - 09.6 mg/dL   Total Protein 7.6  6.0 - 8.3 g/dL   Albumin 3.8  3.5 - 5.2 g/dL   AST 12  0 - 37 U/L   ALT 12  0 - 35 U/L   Alkaline Phosphatase 126 (*) 39 - 117 U/L   Total Bilirubin 0.1 (*) 0.3 - 1.2 mg/dL   GFR calc non Af Amer >90  >90 mL/min   GFR calc Af Amer >90  >90 mL/min  PREGNANCY, URINE      Component Value Range   Preg Test, Ur NEGATIVE  NEGATIVE    1:59 AM accepted in [place] to [facility] per ACT team. PT stable for transfer to PSY  MDM   Depression and SI with hallucinations. PSY admit. Labs and UA reviewed.         Sunnie Nielsen, MD 06/08/12 [id number]

## 2012-06-08 ENCOUNTER — Encounter (HOSPITAL_COMMUNITY): Payer: Self-pay | Admitting: *Deleted

## 2012-06-08 ENCOUNTER — Inpatient Hospital Stay (HOSPITAL_COMMUNITY)
Admission: EM | Admit: 2012-06-08 | Discharge: 2012-06-13 | DRG: 430 | Disposition: A | Discharge status: Home / Self Care | Payer: BC Managed Care – PPO | Source: Ambulatory Visit | Attending: Psychiatry | Admitting: Psychiatry

## 2012-06-08 ENCOUNTER — Telehealth (HOSPITAL_COMMUNITY): Payer: Self-pay

## 2012-06-08 DIAGNOSIS — F1920 Other psychoactive substance dependence, uncomplicated: Secondary | ICD-10-CM | POA: Diagnosis present

## 2012-06-08 DIAGNOSIS — F1994 Other psychoactive substance use, unspecified with psychoactive substance-induced mood disorder: Secondary | ICD-10-CM | POA: Diagnosis present

## 2012-06-08 DIAGNOSIS — K219 Gastro-esophageal reflux disease without esophagitis: Secondary | ICD-10-CM | POA: Diagnosis present

## 2012-06-08 DIAGNOSIS — I10 Essential (primary) hypertension: Secondary | ICD-10-CM | POA: Diagnosis present

## 2012-06-08 DIAGNOSIS — F411 Generalized anxiety disorder: Secondary | ICD-10-CM | POA: Diagnosis present

## 2012-06-08 DIAGNOSIS — Z860100 Personal history of colon polyps, unspecified: Secondary | ICD-10-CM

## 2012-06-08 DIAGNOSIS — L439 Lichen planus, unspecified: Secondary | ICD-10-CM | POA: Diagnosis present

## 2012-06-08 DIAGNOSIS — K5730 Diverticulosis of large intestine without perforation or abscess without bleeding: Secondary | ICD-10-CM | POA: Diagnosis present

## 2012-06-08 DIAGNOSIS — G4733 Obstructive sleep apnea (adult) (pediatric): Secondary | ICD-10-CM | POA: Diagnosis present

## 2012-06-08 DIAGNOSIS — J309 Allergic rhinitis, unspecified: Secondary | ICD-10-CM | POA: Diagnosis present

## 2012-06-08 DIAGNOSIS — Z79899 Other long term (current) drug therapy: Secondary | ICD-10-CM

## 2012-06-08 DIAGNOSIS — M1990 Unspecified osteoarthritis, unspecified site: Secondary | ICD-10-CM | POA: Diagnosis present

## 2012-06-08 DIAGNOSIS — K449 Diaphragmatic hernia without obstruction or gangrene: Secondary | ICD-10-CM | POA: Diagnosis present

## 2012-06-08 DIAGNOSIS — F3130 Bipolar disorder, current episode depressed, mild or moderate severity, unspecified: Secondary | ICD-10-CM

## 2012-06-08 DIAGNOSIS — Z85048 Personal history of other malignant neoplasm of rectum, rectosigmoid junction, and anus: Secondary | ICD-10-CM

## 2012-06-08 DIAGNOSIS — F319 Bipolar disorder, unspecified: Secondary | ICD-10-CM | POA: Diagnosis present

## 2012-06-08 DIAGNOSIS — T380X5A Adverse effect of glucocorticoids and synthetic analogues, initial encounter: Secondary | ICD-10-CM | POA: Diagnosis present

## 2012-06-08 DIAGNOSIS — F3160 Bipolar disorder, current episode mixed, unspecified: Principal | ICD-10-CM | POA: Diagnosis present

## 2012-06-08 DIAGNOSIS — K589 Irritable bowel syndrome without diarrhea: Secondary | ICD-10-CM | POA: Diagnosis present

## 2012-06-08 LAB — COMPREHENSIVE METABOLIC PANEL
AST: 12 U/L (ref 0–37)
Alkaline Phosphatase: 126 U/L — ABNORMAL HIGH (ref 39–117)
BUN: 9 mg/dL (ref 6–23)
CO2: 25 mEq/L (ref 19–32)
Chloride: 98 mEq/L (ref 96–112)
Creatinine, Ser: 0.74 mg/dL (ref 0.50–1.10)
GFR calc non Af Amer: >90 mL/min (ref >90)
Potassium: 3.5 mEq/L (ref 3.5–5.1)
Total Bilirubin: 0.1 mg/dL — ABNORMAL LOW (ref 0.3–1.2)

## 2012-06-08 LAB — GLUCOSE, CAPILLARY: Glucose-Capillary: 74 mg/dL (ref 70–99)

## 2012-06-08 LAB — PREGNANCY, URINE: Preg Test, Ur: NEGATIVE

## 2012-06-08 MED ORDER — ALUM & MAG HYDROXIDE-SIMETH 200-200-20 MG/5ML PO SUSP: 30.0000 mL | ORAL | Status: DC | PRN

## 2012-06-08 MED ORDER — AMLODIPINE BESYLATE 5 MG PO TABS
5.0000 mg | ORAL_TABLET | Freq: Every day | ORAL | Status: DC
  Administered 2012-06-08 – 2012-06-13 (×6): 5 mg via ORAL
  Filled 2012-06-08 (×9): qty 1

## 2012-06-08 MED ORDER — AMLODIPINE BESY-BENAZEPRIL HCL 5-20 MG PO CAPS: 1.0000 | ORAL_CAPSULE | Freq: Every day | ORAL | Status: DC

## 2012-06-08 MED ORDER — LAMOTRIGINE 25 MG PO TABS
25.0000 mg | ORAL_TABLET | Freq: Every day | ORAL | Status: DC
  Administered 2012-06-08 – 2012-06-13 (×6): 25 mg via ORAL
  Filled 2012-06-08 (×9): qty 1

## 2012-06-08 MED ORDER — LITHIUM CARBONATE 300 MG PO CAPS
300.0000 mg | ORAL_CAPSULE | Freq: Two times a day (BID) | ORAL | Status: DC
  Administered 2012-06-08 – 2012-06-12 (×9): 300 mg via ORAL
  Filled 2012-06-08 (×14): qty 1

## 2012-06-08 MED ORDER — GLIPIZIDE ER 10 MG PO TB24
10.0000 mg | ORAL_TABLET | Freq: Every day | ORAL | Status: DC
  Administered 2012-06-08 – 2012-06-13 (×5): 10 mg via ORAL
  Filled 2012-06-08 (×8): qty 1

## 2012-06-08 MED ORDER — METFORMIN HCL ER 500 MG PO TB24
1000.0000 mg | ORAL_TABLET | Freq: Every day | ORAL | Status: DC
  Administered 2012-06-08 – 2012-06-13 (×5): 1000 mg via ORAL
  Filled 2012-06-08 (×8): qty 2

## 2012-06-08 MED ORDER — BENAZEPRIL HCL 20 MG PO TABS
20.0000 mg | ORAL_TABLET | Freq: Every day | ORAL | Status: DC
  Administered 2012-06-08 – 2012-06-13 (×6): 20 mg via ORAL
  Filled 2012-06-08 (×8): qty 1

## 2012-06-08 MED ORDER — ACETAMINOPHEN 325 MG PO TABS
650.0000 mg | ORAL_TABLET | Freq: Four times a day (QID) | ORAL | Status: DC | PRN
  Administered 2012-06-12: 650 mg via ORAL

## 2012-06-08 MED ORDER — MAGNESIUM HYDROXIDE 400 MG/5ML PO SUSP: 30.0000 mL | Freq: Every day | ORAL | Status: DC | PRN

## 2012-06-08 MED ORDER — LAMOTRIGINE 25 MG PO TABS
25.0000 mg | ORAL_TABLET | Freq: Every day | ORAL | Status: DC
  Filled 2012-06-08 (×2): qty 1

## 2012-06-08 MED ORDER — PANTOPRAZOLE SODIUM 40 MG PO TBEC
80.0000 mg | DELAYED_RELEASE_TABLET | Freq: Every day | ORAL | Status: DC
  Administered 2012-06-08 – 2012-06-13 (×6): 80 mg via ORAL
  Filled 2012-06-08 (×7): qty 2

## 2012-06-08 NOTE — Progress Notes (Signed)
Nutrition Note  Reason: MST score of 4  Patient reported she has not had a good appetite but she feels hungry now. Patient had a good lunch meal today. She reported she lost 30 lb over 3 months due to her mental illness, she would forget to eat.   Wt Readings from Last 10 Encounters:  06/08/12 172 lb (78.019 kg)  05/23/12 176 lb 12.8 oz (80.196 kg)  04/10/12 179 lb 9.6 oz (81.466 kg)  09/08/11 190 lb 9.6 oz (86.456 kg)  08/11/11 198 lb (89.812 kg)  08/03/11 198 lb (89.812 kg)  08/03/11 198 lb (89.812 kg)  07/14/11 198 lb 9.6 oz (90.084 kg)  11/30/10 207 lb (93.895 kg)  08/05/10 207 lb (93.895 kg)  *Weight has been trending down, weight down 18 lb over 9 months, 9.5% from baseline.  I have encouraged the patient to have adequate PO intake. I have educated the patient on good sources of protein. I have encouraged her to incorporate good sources of protein into her daily intake. She was without any nutrition related questions.   RD available for nutrition needs.   Iven Finn [facility] [phone]
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
## 2012-06-08 NOTE — Progress Notes (Signed)
Pt is a 56 yo AAF    who has a brother and sister.  Her father was a Product/process development scientist and in later years developed PTSD.  He was drinking alcohol.  He treated mother well.  She was grown when his PTSD sx emerged.  She completed HS and college.  She has a master's degree.  She wrote two books: poetry and autobiography.  She was married after her B. S.degree.   She has a son -married with a daughter., a daughter and adopted daughter age 68. She has been divorced 2010.  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  Her husband was very emotionally distant She thought about suicide ~ 10-12yo taking pills but did not.  She has passive suicidal thoughts.  She has had panic attacks.  She has recently had a very agitated manic episode.  She first noticed she was stuttering - and has never stuttered before.  Simultaneously, she was aware that there were her thoughts/conversations [no voices of someone else] going on in her head.  She had flight of ideas and contradicting thoughts 'waging war' inside her head.  She was not sleeping - not even thinking about sleep or eating and decided she needed help. She had enough of self awareness that she knew she was not acting nomal'    She stayed with a friend for 3 days to 'calm down'.  She Dr.  had a regular appt. With her GP and was told to see her psychiatrist.  Her GI doctor thought her GI sx were not physiological.  She was referred back to her GP. That was when he realized she had psychiatric manic symptoms.  He referred pt back to Dr. Evelene Croon.  Dr. Mora Appl her on Lamictal titrated dose. 25 mg every day. X 5 days.  PBogard, MD 06/08/2012 5:37 PM

## 2012-06-08 NOTE — Tx Team (Signed)
Interdisciplinary Treatment Plan Update (Adult)  Date:  06/08/2012  Time Reviewed:  10:25 AM   Progress in Treatment: Attending groups: Yes Participating in groups:  Yes Taking medication as prescribed:  Yes Tolerating medication: Yes Family/Significant othe contact made:  Requesting consent for contact Patient understands diagnosis: Yes Discussing patient identified problems/goals with staff:  Yes Medical problems stabilized or resolved: Yes Denies suicidal/homicidal ideation: Yes Issues/concerns per patient self-inventory:  No  Other:  New problem(s) identified: None  Reason for Continuation of Hospitalization: Anxiety Depression Medication stabilization Suicidal Ideation  Interventions implemented related to continuation of hospitalization:  Medication stabilization, safety checks q 15 mins, group attendance  Additional comments:  Estimated length of stay: 3-5 days  Discharge Plan: Chaka will discharge home and follow up with Dr. Evelene Croon and Graylin Shiver  New goal(s):  Review of initial/current patient goals per problem list:   1.  Goal(s): Decrease depressive symptoms to rating of 4 or less  Met:  No  Target date: by discharge  As evidenced by: Armina rates depression at 5  2.  Goal (s): Decrease anxiety symptoms to rating of 4 or less  Met:  No  Target date: by discharge  As evidenced by: Autumm rates anxiety at 7  3.  Goal(s): Reduce potential for suicide/self-harm  Met:  Yes  Target date: by discharge  As evidenced by: Tauriel denies any suicidal thoughts  4.  Goal(s): Medication stabilization  Met:  No  Target date: by discharge  As evidenced by: Soul's medications are to be evaluated and adjusted today  Attendees: Patient:     Family:     Physician:  Dr Orson Aloe, MD 06/08/2012 10:25 AM  Nursing:   Nestor Ramp, RN 06/08/2012 10:25 AM  Case Manager:  Juline Patch, LCSW 06/08/2012 10:25 AM  Counselor:   Angus Palms, LCSW 06/08/2012 10:25 AM  Other:  Charlyne Mom, RN 06/08/2012 10:25 AM  Other:     Other:     Other:      Scribe for Treatment Team:   Billie Lade, 06/08/2012 10:25 AM
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
## 2012-06-08 NOTE — Progress Notes (Signed)
  D) Patient pleasant and cooperative upon my assessment. Patient exhibiting abnormal posturing and movements to her neck. Patient states slept "fair." Patient rates depression as   9/10, patient rates hopeless feelings as 5 /10. Patient denies SI/HI, denies A/V hallucinations.   A) Patient offered support and encouragement, patient encouraged to discuss feelings/concerns with staff. Patient verbalized understanding. Patient monitored Q15 minutes for safety. Patient met with MD and treatment team to discuss today's goals and plan of care.  R) Patient active on unit, attending groups in day room and meals in dining room.  Patient insightful today, has a plan to "not rush all the time and take more time for myself." Patient taking medications as ordered. Will continue to monitor.

## 2012-06-08 NOTE — Tx Team (Signed)
Initial Interdisciplinary Treatment Plan  PATIENT STRENGTHS: (choose at least two) Ability for insight Active sense of humor Average or above average intelligence Capable of independent living Communication skills General fund of knowledge Motivation for treatment/growth Physical Health Supportive family/friends  PATIENT STRESSORS: Financial difficulties Health problems Marital or family conflict Medication change or noncompliance   PROBLEM LIST: Problem List/Patient Goals Date to be addressed Date deferred Reason deferred Estimated date of resolution  "Not to stress so much" 06/08/12     "Be able to take time for myself 06/08/12           Auditory/Visual hallucinations 06/08/12     Suicidal Ideation 06/08/12                              DISCHARGE CRITERIA:  Ability to meet basic life and health needs Adequate post-discharge living arrangements Improved stabilization in mood, thinking, and/or behavior Medical problems require only outpatient monitoring Motivation to continue treatment in a less acute level of care Need for constant or close observation no longer present Reduction of life-threatening or endangering symptoms to within safe limits Safe-care adequate arrangements made Verbal commitment to aftercare and medication compliance  PRELIMINARY DISCHARGE PLAN: Outpatient therapy Participate in family therapy Return to previous living arrangement  PATIENT/FAMIILY INVOLVEMENT: This treatment plan has been presented to and reviewed with the patient, EMMALEIGH LONGO, and/or family member.  The patient and family have been given the opportunity to ask questions and make suggestions.  Fransico Michael Laverne 06/08/2012, 4:01 AM
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
## 2012-06-08 NOTE — Progress Notes (Signed)
D: Patient denies SI/HI and A/V hallucinations; patient reports that day was better than fair; patient reports that her appetite is good but energy low but states she did not get much sleep the night before because of when she was admitted  A: Monitored q 15 minutes; patient encouraged to attend  evening group/session;  patient given medications per physician orders; patient encouraged to express feelings and/or concerns  R: Patient is cooperative but very quiet on mileu patient's interaction with staff and peers is minimal and forwards very little.;  patient is taking medications as prescribed; patient attended night group session

## 2012-06-08 NOTE — Progress Notes (Signed)
        [facility] Group Notes: (Counselor/Nursing/MHT/Case Management/Adjunct) 06/08/2012   @1 :15pm [facility]  Type of Therapy:  Group Therapy  Participation Level:  Good  Participation Quality:  Good  Affect:  Appropriate  Cognitive:  Appropriate  Insight:  Good  Engagement in Group:  Good  Engagement in Therapy:  Good  Modes of Intervention:  Support and Exploration  Summary of Progress/Problems: Linda English participated with speaker from [facility] [place] and expressed interest in programs [facility] offers. She nodded along with some of the speaker's statements and seemed to relate well to her description of depression. Linda English shared that she is a very generous person, but she has to really be on guard that her generosity does not come at a cost of her own peace of mind.   Billie Lade 06/08/2012  3:22 PM

## 2012-06-08 NOTE — ED Notes (Signed)
Report called to [facility]. May be transported in 30 minutes.

## 2012-06-09 LAB — GLUCOSE, CAPILLARY
Glucose-Capillary: 190 mg/dL — ABNORMAL HIGH (ref 70–99)
Glucose-Capillary: 98 mg/dL (ref 70–99)
Glucose-Capillary: 77 mg/dL (ref 70–99)

## 2012-06-09 NOTE — Progress Notes (Signed)
[facility] MD Progress Note  06/09/2012 6:44 PM  Current Mental Status Per Physician:  Diagnosis:  Axis I: Bipolar I Disorder - Mixed.   The patient was seen today and reports the following:   ADL's: Intact.  Sleep: The patient reports to having some difficulty initiating and maintaining sleep.  Appetite: The patient reports that her appetite is "up and down."   Mild>(1-10) >Severe  Hopelessness (1-10): 0  Depression (1-10): 5-6  Anxiety (1-10): 5-6   Suicidal Ideation:  The patient denies any suicidal ideations today. Plan: No  Intent: No  Means: No   Homicidal Ideation:  The patient denies any homicidal ideations today.  Plan: No  Intent: No.  Means: No   General Appearance/Behavior: The patient was friendly and cooperative today with this provider.  Eye Contact: Good.  Speech: Appropriate in rate and volume with no pressuring noted.  Motor Behavior: wnl.  Level of Consciousness: Alert and Oriented x 3.  Mental Status: Alert and Oriented x 3.  Mood: Reports moderate depression.  Affect: Appears moderately constricted.  Anxiety Level: Moderate anxiety reported today.  Thought Process: The patient denies any auditory or visual hallucinations today or delusional thinking.  Thought Content: The patient denies any auditory or visual hallucinations today or delusional thinking.  Perception: The patient denies any auditory or visual hallucinations today or delusional thinking.  Judgment: Fair to Good.  Insight: Fair to Good.  Cognition: Oriented to person, place and time.  Sleep:  Number of Hours: 6    Vital Signs:Blood pressure 136/80, pulse 96, temperature 97.3 F (36.3 C), resp. rate 16, height 5\' 2"  (1.575 m), weight 78.019 kg (172 lb).  Current Medications: Current Facility-Administered Medications  Medication Dose Route Frequency Provider Last Rate Last Dose  . acetaminophen (TYLENOL) tablet 650 mg  650 mg Oral Q6H PRN Kerry Hough, PA      . alum & mag  hydroxide-simeth (MAALOX/MYLANTA) 200-200-20 MG/5ML suspension 30 mL  30 mL Oral Q4H PRN Kerry Hough, PA      . amLODipine (NORVASC) tablet 5 mg  5 mg Oral Daily Curlene Labrum Readling, MD   5 mg at 06/09/12 0820   And  . benazepril (LOTENSIN) tablet 20 mg  20 mg Oral Daily Curlene Labrum Readling, MD   20 mg at 06/09/12 0820  . glipiZIDE (GLUCOTROL XL) 24 hr tablet 10 mg  10 mg Oral Daily Mike Craze, MD   10 mg at 06/09/12 0820  . lamoTRIgine (LAMICTAL) tablet 25 mg  25 mg Oral Daily Mickeal Skinner, MD   25 mg at 06/09/12 0821  . lithium carbonate capsule 300 mg  300 mg Oral BID Mike Craze, MD   300 mg at 06/09/12 6295  . magnesium hydroxide (MILK OF MAGNESIA) suspension 30 mL  30 mL Oral Daily PRN Kerry Hough, PA      . metFORMIN (GLUCOPHAGE-XR) 24 hr tablet 1,000 mg  1,000 mg Oral Daily Mike Craze, MD   1,000 mg at 06/09/12 2841  . pantoprazole (PROTONIX) EC tablet 80 mg  80 mg Oral Q1200 Mike Craze, MD   80 mg at 06/09/12 1203   Lab Results:  Results for orders placed during the hospital encounter of 06/08/12 (from the past 48 hour(s))  GLUCOSE, CAPILLARY     Status: Normal   Collection Time   06/08/12  6:17 AM      Component Value Range Comment   Glucose-Capillary 74  70 - 99 mg/dL  Comment 1 Notify RN     GLUCOSE, CAPILLARY     Status: Abnormal   Collection Time   06/08/12 12:01 PM      Component Value Range Comment   Glucose-Capillary 183 (*) 70 - 99 mg/dL   GLUCOSE, CAPILLARY     Status: Abnormal   Collection Time   06/08/12  5:49 PM      Component Value Range Comment   Glucose-Capillary 68 (*) 70 - 99 mg/dL    Comment 1 Notify RN     GLUCOSE, CAPILLARY     Status: Normal   Collection Time   06/08/12 10:03 PM      Component Value Range Comment   Glucose-Capillary 91  70 - 99 mg/dL    Comment 1 Notify RN      Comment 2 Documented in Chart     GLUCOSE, CAPILLARY     Status: Abnormal   Collection Time   06/09/12  8:18 AM      Component Value Range Comment    Glucose-Capillary 190 (*) 70 - 99 mg/dL   GLUCOSE, CAPILLARY     Status: Normal   Collection Time   06/09/12 11:40 AM      Component Value Range Comment   Glucose-Capillary 98  70 - 99 mg/dL   GLUCOSE, CAPILLARY     Status: Normal   Collection Time   06/09/12  5:09 PM      Component Value Range Comment   Glucose-Capillary 77  70 - 99 mg/dL    Physical Findings: AIMS: Facial and Oral Movements Muscles of Facial Expression: None, normal Lips and Perioral Area: None, normal Jaw: None, normal Tongue: None, normal,Extremity Movements Upper (arms, wrists, hands, fingers): None, normal Lower (legs, knees, ankles, toes): None, normal, Trunk Movements Neck, shoulders, hips: None, normal, Overall Severity Severity of abnormal movements (highest score from questions above): None, normal Incapacitation due to abnormal movements: None, normal Patient's awareness of abnormal movements (rate only patient's report): No Awareness, Dental Status Current problems with teeth and/or dentures?: No Does patient usually wear dentures?: No   Review of Systems:  Neurological: No headaches, seizures or dizziness reported.  G.I.: The patient denies any constipation or stomach upset today.  Musculoskeletal: The patient denies any musculoskeletal related issues.   Time was spent today discussing with the patient her current symptoms. The patient reports to having some difficulty initiating and maintaining sleep at night. The patient reports that her appetite is improving but is "up and down."  The patient reports moderate feelings of sadness, anhedonia and depressed mood. She denies any suicidal or homicidal ideations today and denies any auditory or visual hallucinations or delusional thinking.  She reports moderate symptoms of anxiety.   Treatment Plan Summary:  1. Daily contact with patient to assess and evaluate symptoms and progress in treatment.  2. Medication management  3. The patient will deny  suicidal ideations or homicidal ideations for 48 hours prior to discharge and have a depression and anxiety rating of 3 or less. The patient will also deny any auditory or visual hallucinations or delusional thinking.  4. The patient will deny any symptoms of substance withdrawal at time of discharge.   Plan:  1. Will continue the patient on her current medications as listed above.  2. Laboratory Studies reviewed.  3. Will continue to monitor.  RANDY READLING 06/09/2012, 6:44 PM

## 2012-06-09 NOTE — Progress Notes (Signed)
[facility] Group Notes: (Counselor/Nursing/MHT/Case Management/Adjunct) 06/09/2012   @1 :15 -2:30pm Preventing Relapse  Type of Therapy:  Group Therapy  Participation Level:  Limited  Participation Quality:  Appropriate, Sharing  Affect:  Tearful  Cognitive:  Appropriate  Insight:  Limited  Engagement in Group: Good  Engagement in Therapy:  Limited  Modes of Intervention:  Support and Exploration  Summary of Progress/Problems: Linda English was initially very quiet, but toward the middle of group began to get tearful. She asked if it was possible that a person had been depressed their whole life, and upon counselor normalizing this experience began to open up. Linda English processed feeling different from her sisters, and talked about never feeling as worthy as them. She stated that she always thought something was wrong with her, but never realized it was depression until recently. She explored how much strength it has taken to get this far in life and identified that although she does not believe it in her heart, she knows she must be capable.  [place]us [place], LCSW 06/09/2012 3:23 PM

## 2012-06-09 NOTE — Progress Notes (Signed)
Psychoeducational Group Note  Date:  06/09/2012 Time:  1100  Group Topic/Focus:  Relapse Prevention Planning:   The focus of this group is to define relapse and discuss the need for planning to combat relapse.  Participation Level:  Did Not Attend  Participation Quality:    Affect:    Cognitive:    Insight:    Engagement in Group:    Additional Comments:  Pt was sleeping.  Isla Pence M 06/09/2012, 2:01 PM

## 2012-06-09 NOTE — Progress Notes (Signed)
  D) Patient pleasant and cooperative upon my assessment. Patient appears brighter today, interacting easily with peers in dayroom. Patient states slept "fair," and  appetite is " improving." Patient rates depression as  3 /10, patient rates hopeless feelings as  1/10. Patient denies SI/HI, denies A/V hallucinations.   A) Patient offered support and encouragement, patient encouraged to discuss feelings/concerns with staff. Patient verbalized understanding. Patient monitored Q15 minutes for safety. Patient met with MD and treatment team to discuss today's goals and plan of care.  R) Patient active on unit, attending groups in day room and meals in dining room.  Patient taking medications as ordered. Will continue to monitor.

## 2012-06-09 NOTE — Progress Notes (Signed)
[facility] Group Notes:  (Counselor/Nursing/MHT/Case Management/Adjunct)  06/09/2012 11:15 AM  Type of Therapy:  Group Therapy  Participation Level:  Active  Participation Quality:  Appropriate  Affect:  Appropriate  Cognitive:  Appropriate  Insight:  Good  Engagement in Group:  Good  Engagement in Therapy:  Good  Modes of Intervention:  Socialization  Summary of Progress/Problems:The purpose of this group was to actively engage patient in a therapeutic activity entitled "Human Bingo". The purpose of the group was to increase positive socialization skills and effective communication amongst the patient and their peers. Patient was very engaged in the activity and was pleasant and cooperative.        Ardelle Park O 06/09/2012, 11:15 AM

## 2012-06-10 LAB — GLUCOSE, CAPILLARY: Glucose-Capillary: 102 mg/dL — ABNORMAL HIGH (ref 70–99)

## 2012-06-10 NOTE — Progress Notes (Signed)
[facility] Family/Significant Other Suicide Prevention Education  Suicide Prevention Education:  Patient Refusal for Family/Significant Other Suicide Prevention Education: The patient Linda English has refused to provide written consent for family/significant other to be provided Family/Significant Other Suicide Prevention Education during admission and/or prior to discharge.  Physician notified. Pt. gave consent for  Therapist and mental health professionals but had no contact information for SI and safety planning with a family member.  Lamar Blinks [place] 06/10/2012, 2:47 PM

## 2012-06-10 NOTE — Progress Notes (Signed)
D) Pt has been attending the groups and interacts with her peers. Has insight and is aware of how her behavior affects her decisions and her mood. Rates her depression at a 6, hopelessness at a 5 and denies SI and HI. A) Given support, reassurance along with praise. Given encouragement R) Denies SI and HI.

## 2012-06-10 NOTE — Progress Notes (Signed)
Psychoeducational Group Note  Date:  06/10/2012 Time: 1015  Group Topic/Focus:  Identifying Needs:   The focus of this group is to help patients identify their personal needs that have been historically problematic and identify healthy behaviors to address their needs.  Participation Level:  active Participation Quality: good Affect: flat Cognitive:    Insight:  good  Engagement in Group: engaged  Additional Comments: Pt was engaged in group, asked appropriate questions, shared personal life experience with the group and demonstrates willingness to process and understand her problems. PDuke RN BC   Psychoeducational Group Note  Date:  06/10/2012 Time: 1015  Group Topic/Focus:  Identifying Needs:   The focus of this group is to help patients identify their personal needs that have been historically problematic and identify healthy behaviors to address their needs.  Participation Level:  active Participation Quality: good Affect: flat Cognitive:    Insight:  good  Engagement in Group: engaged  Additional Comments: Pt was engaged in group, asked appropriate questions, shared personal life experience with the group and demonstrates willingness to process and understand her problems. PDuke RN BC    

## 2012-06-10 NOTE — Progress Notes (Signed)
Patient ID: Linda English, female   DOB: Dec 05, 1955, 56 y.o.   MRN: [id number]  Pt. attended and participated in aftercare planning group. Pt. accepted information on suicide prevention, warning signs to look for with suicide and crisis line numbers to use. The pt. agreed to call crisis line numbers if having warning signs or having thoughts of suicide. Pt. listed their current anxiety level as4-6 and depression as a 6 (both on a scale of 1-10 with 1 being lowest and 10 being highest)  Pt stated she is having no SI or HI.

## 2012-06-10 NOTE — Progress Notes (Signed)
Patient ID: Linda English, female   DOB: 1956-02-05, 56 y.o.   MRN: [id number] [facility] Group Notes:  (Counselor/Nursing/MHT/Case Management/Adjunct)  06/10/2012 11 AM  Type of Therapy: Group Therapy  Participation Level:  Active  Participation Quality:  Appropriate and Sharing  Affect:  Appropriate  Cognitive:  Appropriate  Insight:  Limited  Engagement in Group:  Limited  Engagement in Therapy:  Limited  Modes of Intervention:  Clarification, Problem-solving, Role-play, Socialization and Support  Summary of Progress/Problems: Pt participated in counseling group on positive and negative emotions and how to utilize coping skills to progress from negative to positive emotional states. Pt was able to recognize differences in being in a positive or negative emotional state in a creative and visual activity. Pt shared feelings and engaged with the group minimally.  Pt shared her emotions and experiences with the group appropriately.     Debarah Crape 06/10/2012. 3:09 PM

## 2012-06-10 NOTE — Progress Notes (Addendum)
Patient observed sitting in the dayroom and was informed of her 2000 scheduled medication. Patient inquired as to why her wellbutrin had been stopped. Writer encouraged patient to speak with her doctor about this issue. Patient was pleasant and cooperative, attended group. Patient currently denies si/ hi/a/v hall. Support and encouragement offered, safety maintained on unit, will continue to monitor.

## 2012-06-10 NOTE — Progress Notes (Signed)
Patient ID: Linda English, female   DOB: 04-22-1956, 56 y.o.   MRN: [id number] [facility] MD Progress Note  06/10/2012 8:59 PM  Current Mental Status Per Physician:  Diagnosis:  Axis I: Bipolar I Disorder - Mixed.   The patient was seen today and reports the following:   ADL's: Intact.  Sleep: The patient reports to having fair sleep  Appetite: The patient reports that her appetite is down   Mild>(1-10) >Severe  Hopelessness (1-10): 0  Depression (1-10): 5-6  Anxiety (1-10): 5-6   Suicidal Ideation:  The patient denies any suicidal ideations today. Plan: No  Intent: No  Means: No   Homicidal Ideation:  The patient denies any homicidal ideations today.  Plan: No  Intent: No.  Means: No   General Appearance/Behavior: The patient was friendly and cooperative today with this provider.  Eye Contact: Good.  Speech: Appropriate in rate and volume with no pressuring noted.  Motor Behavior: wnl.  Level of Consciousness: Alert and Oriented x 3.  Mental Status: Alert and Oriented x 3.  Mood: Reports moderate depression.  Affect: Appears moderately constricted.  Anxiety Level: Moderate anxiety reported today.  Thought Process: The patient denies any auditory or visual hallucinations today or delusional thinking.  Thought Content: The patient denies any auditory or visual hallucinations today or delusional thinking.  Perception: The patient denies any auditory or visual hallucinations today or delusional thinking.  Judgment: Fair to Good.  Insight: Fair to Good.  Cognition: Oriented to person, place and time.  Sleep:  Number of Hours: 6.75    Vital Signs:Blood pressure 133/80, pulse 99, temperature 98 F (36.7 C), resp. rate 20, height 5\' 2"  (1.575 m), weight 78.019 kg (172 lb).  Current Medications: Current Facility-Administered Medications  Medication Dose Route Frequency Provider Last Rate Last Dose  . acetaminophen (TYLENOL) tablet 650 mg  650 mg Oral Q6H PRN Kerry Hough, PA      . alum & mag hydroxide-simeth (MAALOX/MYLANTA) 200-200-20 MG/5ML suspension 30 mL  30 mL Oral Q4H PRN Kerry Hough, PA      . amLODipine (NORVASC) tablet 5 mg  5 mg Oral Daily Curlene Labrum Readling, MD   5 mg at 06/10/12 0837   And  . benazepril (LOTENSIN) tablet 20 mg  20 mg Oral Daily Curlene Labrum Readling, MD   20 mg at 06/10/12 0837  . glipiZIDE (GLUCOTROL XL) 24 hr tablet 10 mg  10 mg Oral Daily Mike Craze, MD   10 mg at 06/10/12 0837  . lamoTRIgine (LAMICTAL) tablet 25 mg  25 mg Oral Daily Mickeal Skinner, MD   25 mg at 06/10/12 0837  . lithium carbonate capsule 300 mg  300 mg Oral BID Mike Craze, MD   300 mg at 06/10/12 2012  . magnesium hydroxide (MILK OF MAGNESIA) suspension 30 mL  30 mL Oral Daily PRN Kerry Hough, PA      . metFORMIN (GLUCOPHAGE-XR) 24 hr tablet 1,000 mg  1,000 mg Oral Daily Mike Craze, MD   1,000 mg at 06/10/12 0837  . pantoprazole (PROTONIX) EC tablet 80 mg  80 mg Oral Q1200 Mike Craze, MD   80 mg at 06/10/12 1209   Lab Results:  Results for orders placed during the hospital encounter of 06/08/12 (from the past 48 hour(s))  GLUCOSE, CAPILLARY     Status: Normal   Collection Time   06/08/12 10:03 PM      Component Value Range Comment  Glucose-Capillary 91  70 - 99 mg/dL    Comment 1 Notify RN      Comment 2 Documented in Chart     GLUCOSE, CAPILLARY     Status: Abnormal   Collection Time   06/09/12  8:18 AM      Component Value Range Comment   Glucose-Capillary 190 (*) 70 - 99 mg/dL   GLUCOSE, CAPILLARY     Status: Normal   Collection Time   06/09/12 11:40 AM      Component Value Range Comment   Glucose-Capillary 98  70 - 99 mg/dL   GLUCOSE, CAPILLARY     Status: Normal   Collection Time   06/09/12  5:09 PM      Component Value Range Comment   Glucose-Capillary 77  70 - 99 mg/dL   GLUCOSE, CAPILLARY     Status: Abnormal   Collection Time   06/09/12  9:23 PM      Component Value Range Comment   Glucose-Capillary 136 (*) 70  - 99 mg/dL   GLUCOSE, CAPILLARY     Status: Normal   Collection Time   06/10/12  6:18 AM      Component Value Range Comment   Glucose-Capillary 88  70 - 99 mg/dL    Physical Findings: AIMS: Facial and Oral Movements Muscles of Facial Expression: None, normal Lips and Perioral Area: None, normal Jaw: None, normal Tongue: None, normal,Extremity Movements Upper (arms, wrists, hands, fingers): None, normal Lower (legs, knees, ankles, toes): None, normal, Trunk Movements Neck, shoulders, hips: None, normal, Overall Severity Severity of abnormal movements (highest score from questions above): None, normal Incapacitation due to abnormal movements: None, normal Patient's awareness of abnormal movements (rate only patient's report): No Awareness, Dental Status Current problems with teeth and/or dentures?: No Does patient usually wear dentures?: No   Review of Systems:  Neurological: No headaches, seizures or dizziness reported.  G.I.: The patient denies any constipation or stomach upset today.  Musculoskeletal: The patient denies any musculoskeletal related issues.   Time was spent today discussing with the patient her current symptoms. The patient reports to having more nausea now and relates that meds  Treatment Plan Summary:  1. Daily contact with patient to assess and evaluate symptoms and progress in treatment.  2. Medication management  3. The patient will deny suicidal ideations or homicidal ideations for 48 hours prior to discharge and have a depression and anxiety rating of 3 or less. The patient will also deny any auditory or visual hallucinations or delusional thinking.  4. The patient will deny any symptoms of substance withdrawal at time of discharge.   Plan:  1. Will continue the patient on her current medications as listed above.   3. Will continue to monitor for nausea  Wonda Cerise 06/10/2012, 8:59 PM

## 2012-06-10 NOTE — Progress Notes (Signed)
[facility] Group Notes:  (Counselor/Nursing/MHT/Case Management/Adjunct)  06/10/2012 1:40 AM  Type of Therapy:  Psychoeducational Skills  Participation Level:  Active  Participation Quality:  Appropriate  Affect:  Appropriate  Cognitive:  Appropriate  Insight:  Good  Engagement in Group:  Good  Engagement in Therapy:  Good  Modes of Intervention:  Education  Summary of Progress/Problems:The patient mentioned that she had a bad morning since she didn't feel well. Her afternoon saw some improvement and had a positive visit with a family member. Her goal for tomorrow is to feel even better than she did today.    Jameah Rouser S 06/10/2012, 1:40 AM

## 2012-06-11 LAB — GLUCOSE, CAPILLARY
Glucose-Capillary: 53 mg/dL — ABNORMAL LOW (ref 70–99)
Glucose-Capillary: 59 mg/dL — ABNORMAL LOW (ref 70–99)
Glucose-Capillary: 65 mg/dL — ABNORMAL LOW (ref 70–99)

## 2012-06-11 MED ORDER — GLUCOSE 40 % PO GEL
1.0000 | Freq: Once | ORAL | Status: AC
  Administered 2012-06-11: 37.5 g via ORAL
  Filled 2012-06-11: qty 1.65

## 2012-06-11 NOTE — Progress Notes (Signed)
Patient ID: Linda English, female   DOB: 06/20/1956, 56 y.o.   MRN: [id number]  Pt. attended and participated in aftercare planning group. Pt. accepted information on suicide prevention, warning signs to look for with suicide and crisis line numbers to use. The pt. agreed to call crisis line numbers if having warning signs or having thoughts of suicide. Pt. listed their current anxiety level as 4 and her depression as a 4 (both out of  10 with 1 being lowest and 10 being highest).  Pt denied SI and HI.

## 2012-06-11 NOTE — Progress Notes (Signed)
Writer spoke with patient concerning how her day has been and patient reports that since her wellbutrin was stopped she has been nauseated and tired a lot. Patient was offered gingerale for the nausea and encouraged to let writer know if it continues. Patient reports that she called her son this evening to let him know where she was since she had not told him yet. Patient is concerned that he may be upset with her since he has not returned her call yet. Writer encouraged patient to focus on the positive for coming in to seek help. Patient currently denies having pain, -si/hi/a/v hallucinations. Patient is compliant with medication.  Safety maintained on the unit, will continue to monitor.

## 2012-06-11 NOTE — Progress Notes (Signed)
Psychoeducational Group Note  Date:  06/11/2012 Time:  1015  Group Topic/Focus:  Making Healthy Choices:   The focus of this group is to help patients identify negative/unhealthy choices they were using prior to admission and identify positive/healthier coping strategies to replace them upon discharge.  Participation Level:  Active  Participation Quality:  Appropriate  Affect:  Appropriate  Cognitive:  Appropriate  Insight:  Good  Engagement in Group:  Good  Additional Comments:    Tenasia Aull A 06/11/2012  

## 2012-06-11 NOTE — Progress Notes (Signed)
CBG rechecked and was 65, patient going to breakfast and will recheck in 15 minutes. Results will be documented.

## 2012-06-11 NOTE — Progress Notes (Signed)
Hypoglycemic Event  CBG: 53  Treatment: 15 GM gel  Symptoms: Sweaty  Follow-up CBG: Time:0640 CBG Result:59  Possible Reasons for Event: Unknown  Comments/MD notified: [name] PA notified and made aware of event, protocol will be repeated    Linda English  Remember to initiate Hypoglycemia Order Set & complete

## 2012-06-11 NOTE — Progress Notes (Signed)
Patients CBG was 53 and hypoglycemic protocol was followed. Patient received 1 tube of oral glucose and blood sugar rechecked after 30 minutes, CBG was 59. Hypoglycemic protocol repeated with 15 gm carbohydrates. [name] PA on call was notified and received no new orders will recheck blood sugar and follow protocol if needed.

## 2012-06-11 NOTE — Progress Notes (Addendum)
D) Pt.'s blood sugar was low this morning. Glipizide and metformin were held. Pt's blood pressure in the low 70's at lunch. Is quiet and tends to be more seclusive. Attends the groups and does participate.Rates her depression at a 4 and her hopelessness at a 2. Denies SI and HI.' A) Given support, monitored throughout the shift. Given encouragement and praise. R) Denies SI and HI.

## 2012-06-11 NOTE — Progress Notes (Signed)
Patient ID: Linda English, female   DOB: 1956-07-08, 56 y.o.   MRN: [id number] Patient ID: Linda English, female   DOB: 08-04-1956, 56 y.o.   MRN: [id number] [facility] MD Progress Note  06/11/2012 8:51 PM  Current Mental Status Per Physician:  Diagnosis:  Axis I: Bipolar I Disorder - Mixed.   The patient was seen today and reports the following:   ADL's: Intact.  Sleep: The patient reports to having fair sleep  Appetite: The patient reports that her appetite is down   Mild>(1-10) >Severe  Hopelessness (1-10): 0  Depression (1-10): 5-6  Anxiety (1-10): 5-6   Suicidal Ideation:  The patient denies any suicidal ideations today. Plan: No  Intent: No  Means: No   Homicidal Ideation:  The patient denies any homicidal ideations today.  Plan: No  Intent: No.  Means: No   General Appearance/Behavior: The patient was friendly and cooperative today with this provider.  Eye Contact: Good.  Speech: Appropriate in rate and volume with no pressuring noted.  Motor Behavior: wnl.  Level of Consciousness: Alert and Oriented x 3.  Mental Status: Alert and Oriented x 3.  Mood: Reports moderate depression.  Affect: labile at times  Anxiety Level: Moderate anxiety reported today.  Thought Process: The patient denies any auditory or visual hallucinations today or delusional thinking.  Thought Content: The patient denies any auditory or visual hallucinations today or delusional thinking.  Perception: The patient denies any auditory or visual hallucinations today or delusional thinking.  Judgment: Fair to Good.  Insight: Fair to Good.  Cognition: Oriented to person, place and time.  Sleep:  Number of Hours: 6.75    Vital Signs:Blood pressure 146/85, pulse 105, temperature 98.1 F (36.7 C), resp. rate 18, height 5\' 2"  (1.575 m), weight 78.019 kg (172 lb).  Current Medications: Current Facility-Administered Medications  Medication Dose Route Frequency Provider Last Rate Last Dose    . acetaminophen (TYLENOL) tablet 650 mg  650 mg Oral Q6H PRN Kerry Hough, PA      . alum & mag hydroxide-simeth (MAALOX/MYLANTA) 200-200-20 MG/5ML suspension 30 mL  30 mL Oral Q4H PRN Kerry Hough, PA      . amLODipine (NORVASC) tablet 5 mg  5 mg Oral Daily Curlene Labrum Readling, MD   5 mg at 06/11/12 6962   And  . benazepril (LOTENSIN) tablet 20 mg  20 mg Oral Daily Curlene Labrum Readling, MD   20 mg at 06/11/12 0828  . dextrose (GLUTOSE) 40 % oral gel 37.5 g  1 Tube Oral Once [employer], PA-C      . glipiZIDE (GLUCOTROL XL) 24 hr tablet 10 mg  10 mg Oral Daily Mike Craze, MD   10 mg at 06/10/12 0837  . lamoTRIgine (LAMICTAL) tablet 25 mg  25 mg Oral Daily Mickeal Skinner, MD   25 mg at 06/11/12 0829  . lithium carbonate capsule 300 mg  300 mg Oral BID Mike Craze, MD   300 mg at 06/11/12 2005  . magnesium hydroxide (MILK OF MAGNESIA) suspension 30 mL  30 mL Oral Daily PRN Kerry Hough, PA      . metFORMIN (GLUCOPHAGE-XR) 24 hr tablet 1,000 mg  1,000 mg Oral Daily Mike Craze, MD   1,000 mg at 06/10/12 0837  . pantoprazole (PROTONIX) EC tablet 80 mg  80 mg Oral Q1200 Mike Craze, MD   80 mg at 06/11/12 1153   Lab Results:  Results for orders placed during  the hospital encounter of 06/08/12 (from the past 48 hour(s))  GLUCOSE, CAPILLARY     Status: Abnormal   Collection Time   06/09/12  9:23 PM      Component Value Range Comment   Glucose-Capillary 136 (*) 70 - 99 mg/dL   GLUCOSE, CAPILLARY     Status: Normal   Collection Time   06/10/12  6:18 AM      Component Value Range Comment   Glucose-Capillary 88  70 - 99 mg/dL   GLUCOSE, CAPILLARY     Status: Abnormal   Collection Time   06/10/12 11:45 AM      Component Value Range Comment   Glucose-Capillary 102 (*) 70 - 99 mg/dL   GLUCOSE, CAPILLARY     Status: Normal   Collection Time   06/10/12  4:58 PM      Component Value Range Comment   Glucose-Capillary 87  70 - 99 mg/dL    Comment 1 Notify RN      Comment 2  Documented in Chart     GLUCOSE, CAPILLARY     Status: Abnormal   Collection Time   06/11/12  6:06 AM      Component Value Range Comment   Glucose-Capillary 53 (*) 70 - 99 mg/dL    Comment 1 Notify RN     GLUCOSE, CAPILLARY     Status: Abnormal   Collection Time   06/11/12  6:38 AM      Component Value Range Comment   Glucose-Capillary 59 (*) 70 - 99 mg/dL    Comment 1 Notify RN     GLUCOSE, CAPILLARY     Status: Abnormal   Collection Time   06/11/12  6:53 AM      Component Value Range Comment   Glucose-Capillary 65 (*) 70 - 99 mg/dL   GLUCOSE, CAPILLARY     Status: Abnormal   Collection Time   06/11/12  7:18 AM      Component Value Range Comment   Glucose-Capillary 122 (*) 70 - 99 mg/dL   GLUCOSE, CAPILLARY     Status: Normal   Collection Time   06/11/12 11:46 AM      Component Value Range Comment   Glucose-Capillary 73  70 - 99 mg/dL   GLUCOSE, CAPILLARY     Status: Normal   Collection Time   06/11/12  5:09 PM      Component Value Range Comment   Glucose-Capillary 85  70 - 99 mg/dL    Physical Findings: AIMS: Facial and Oral Movements Muscles of Facial Expression: None, normal Lips and Perioral Area: None, normal Jaw: None, normal Tongue: None, normal,Extremity Movements Upper (arms, wrists, hands, fingers): None, normal Lower (legs, knees, ankles, toes): None, normal, Trunk Movements Neck, shoulders, hips: None, normal, Overall Severity Severity of abnormal movements (highest score from questions above): None, normal Incapacitation due to abnormal movements: None, normal Patient's awareness of abnormal movements (rate only patient's report): No Awareness, Dental Status Current problems with teeth and/or dentures?: No Does patient usually wear dentures?: No   Review of Systems:  Neurological: No headaches, seizures or dizziness reported.  G.I.: The patient denies any constipation or stomach upset today.  Musculoskeletal: The patient denies any musculoskeletal  related issues.   Time was spent today discussing with the patient her current symptoms. nausea better today. Thinks she is getting better now.  Treatment Plan Summary:  1. Daily contact with patient to assess and evaluate symptoms and progress in treatment.  2. Medication management  3. The patient will deny suicidal ideations or homicidal ideations for 48 hours prior to discharge and have a depression and anxiety rating of 3 or less. The patient will also deny any auditory or visual hallucinations or delusional thinking.  4. The patient will deny any symptoms of substance withdrawal at time of discharge.   Plan:  1. Will continue the patient on her current medications as listed above.   2. Will continue to monitor for nausea  Wonda Cerise 06/11/2012, 8:51 PM

## 2012-06-11 NOTE — Progress Notes (Signed)
Hypoglycemic Event  CBG 65  Treatment: 15 GM carbohydrate snack  Symptoms: Sweaty  Follow-up CBG: Time: 0720 CBG Result: 122  Possible Reasons for Event: Unknown  Comments/MD notified: [name] PA  was notified of event earlier, will continue protocol until CBG within parameters    Linda English  Remember to initiate Hypoglycemia Order Set & complete

## 2012-06-11 NOTE — Progress Notes (Signed)
[facility] Group Notes:  (Counselor/Nursing/MHT/Case Management/Adjunct)  06/11/2012 1:37 AM  Type of Therapy:    Participation Level:  Active  Participation Quality:  Appropriate  Affect:  Blunted  Cognitive:  Appropriate  Insight:  Good  Engagement in Group:  Good  Engagement in Therapy:  Good  Modes of Intervention:  Education  Summary of Progress/Problems: The patient verbalized in group that she didn't have a very good day for a number of reasons. First of all, she indicated that she had spells of feeling very "emotional" throughout the day. Secondly, she indicated that she felt "dizzy" and had a "headache" for much of the day. Thirdly, she expressed that she felt very sad over missing her children. In addition, she endorses not being able to sleep well and that she needs to use a c-pap machine at night. She did not state a goal for tomorrow.    Kailah Pennel S 06/11/2012, 1:37 AM

## 2012-06-11 NOTE — Progress Notes (Signed)
Patient ID: KINNEDY MONGIELLO, female   DOB: 01/11/1956, 56 y.o.   MRN: [id number] [facility] Group Notes:  (Counselor/Nursing/MHT/Case Management/Adjunct)  06/11/2012 1:15 PM  Type of Therapy:  Group Therapy, Dance/Movement Therapy   Participation Level:  Minimal  Participation Quality:  Drowsy  Affect:  Appropriate and Depressed  Cognitive:  Appropriate  Insight:  Limited  Engagement in Group:  Limited  Engagement in Therapy:  Limited  Modes of Intervention:  Clarification, Problem-solving, Role-play, Socialization and Support  Summary of Progress/Problems:    Pt participated in group therapy focused on identifying supports, and distinguishing between healthy and unhealthy supports.  Pt participated in activity to identify traits of healthy and unhealthy supports and engaged in group processing.  Pt participated minimally during group but did share some of her own experiences and examples of supports.    Debarah Crape 06/11/2012. 3:02 PM

## 2012-06-12 DIAGNOSIS — F3160 Bipolar disorder, current episode mixed, unspecified: Principal | ICD-10-CM

## 2012-06-12 DIAGNOSIS — F1994 Other psychoactive substance use, unspecified with psychoactive substance-induced mood disorder: Secondary | ICD-10-CM

## 2012-06-12 DIAGNOSIS — F1120 Opioid dependence, uncomplicated: Secondary | ICD-10-CM

## 2012-06-12 MED ORDER — DOCUSATE SODIUM 100 MG PO CAPS: 100.0000 mg | ORAL_CAPSULE | Freq: Two times a day (BID) | ORAL | Status: DC | PRN

## 2012-06-12 MED ORDER — HYDROXYZINE HCL 25 MG PO TABS
25.0000 mg | ORAL_TABLET | Freq: Every evening | ORAL | Status: DC | PRN
  Administered 2012-06-12: 25 mg via ORAL
  Filled 2012-06-12 (×6): qty 1

## 2012-06-12 MED ORDER — DOCUSATE SODIUM 100 MG PO CAPS
100.0000 mg | ORAL_CAPSULE | Freq: Two times a day (BID) | ORAL | Status: DC
  Administered 2012-06-12 – 2012-06-13 (×3): 100 mg via ORAL
  Filled 2012-06-12 (×6): qty 1

## 2012-06-12 MED ORDER — DARIFENACIN HYDROBROMIDE ER 15 MG PO TB24
15.0000 mg | ORAL_TABLET | Freq: Every day | ORAL | Status: DC
  Administered 2012-06-12 – 2012-06-13 (×2): 15 mg via ORAL
  Filled 2012-06-12 (×4): qty 1

## 2012-06-12 MED ORDER — LITHIUM CARBONATE 300 MG PO CAPS
300.0000 mg | ORAL_CAPSULE | Freq: Two times a day (BID) | ORAL | Status: DC
  Administered 2012-06-12 – 2012-06-13 (×2): 300 mg via ORAL
  Filled 2012-06-12 (×5): qty 1

## 2012-06-12 NOTE — Progress Notes (Signed)
Patient attended MHT group. Writer used the therapeutic ball and physical activity to talk about issues and to get to know peers. 

## 2012-06-12 NOTE — Progress Notes (Signed)
[facility] MD Progress Note  06/12/2012 11:32 PM  Current Mental Status Per Physician:  Diagnosis:  Axis I: Bipolar I Disorder - Mixed.   The patient was seen today and reports the following:   ADL's: Intact.  Sleep: The patient reports to having some difficulty initiating and maintaining sleep.  Appetite: The patient reports that her appetite is "up and down."   Mild>(1-10) >Severe  Hopelessness (1-10): 0  Depression (1-10): 5-6  Anxiety (1-10): 5-6   Suicidal Ideation:  The patient denies any suicidal ideations today. Plan: No  Intent: No  Means: No   Homicidal Ideation:  The patient denies any homicidal ideations today.  Plan: No  Intent: No.  Means: No   General Appearance/Behavior: The patient was friendly and cooperative today with this provider.  Eye Contact: Good.  Speech: Appropriate in rate and volume with no pressuring noted.  Motor Behavior: wnl.  Level of Consciousness: Alert and Oriented x 3.  Mental Status: Alert and Oriented x 3.  Mood: Reports moderate depression.  Affect: Appears moderately constricted.  Anxiety Level: Moderate anxiety reported today.  Thought Process: The patient denies any auditory or visual hallucinations today or delusional thinking.  Thought Content: The patient denies any auditory or visual hallucinations today or delusional thinking.  Perception: The patient denies any auditory or visual hallucinations today or delusional thinking.  Judgment: Fair to Good.  Insight: Fair to Good.  Cognition: Oriented to person, place and time.  Sleep:  Number of Hours: 4.5    ROS: Neuro: no ataxia, weakness, noting headaches GI: no N/V, but GI problems with IBS MS: no weakness, muscle cramps.  Vital Signs:Blood pressure 150/86, pulse 111, temperature 98.7 F (37.1 C), temperature source Oral, resp. rate 18, height 5\' 2"  (1.575 m), weight 78.019 kg (172 lb).  Current Medications: Current Facility-Administered Medications  Medication Dose Route  Frequency Provider Last Rate Last Dose  . acetaminophen (TYLENOL) tablet 650 mg  650 mg Oral Q6H PRN Kerry Hough, PA   650 mg at 06/12/12 1111  . alum & mag hydroxide-simeth (MAALOX/MYLANTA) 200-200-20 MG/5ML suspension 30 mL  30 mL Oral Q4H PRN Kerry Hough, PA      . amLODipine (NORVASC) tablet 5 mg  5 mg Oral Daily Curlene Labrum Readling, MD   5 mg at 06/12/12 0854   And  . benazepril (LOTENSIN) tablet 20 mg  20 mg Oral Daily Curlene Labrum Readling, MD   20 mg at 06/12/12 0855  . darifenacin (ENABLEX) 24 hr tablet 15 mg  15 mg Oral Daily Mike Craze, MD   15 mg at 06/12/12 1935  . docusate sodium (COLACE) capsule 100 mg  100 mg Oral BID Mike Craze, MD   100 mg at 06/12/12 1841   Followed by  . docusate sodium (COLACE) capsule 100 mg  100 mg Oral BID PRN Mike Craze, MD      . glipiZIDE (GLUCOTROL XL) 24 hr tablet 10 mg  10 mg Oral Daily Mike Craze, MD   10 mg at 06/12/12 0855  . hydrOXYzine (ATARAX/VISTARIL) tablet 25 mg  25 mg Oral QHS,MR X 1 Mike Craze, MD   25 mg at 06/12/12 2150  . lamoTRIgine (LAMICTAL) tablet 25 mg  25 mg Oral Daily Mickeal Skinner, MD   25 mg at 06/12/12 0856  . lithium carbonate capsule 300 mg  300 mg Oral BID WC Mike Craze, MD   300 mg at 06/12/12 1841  . magnesium hydroxide (  MILK OF MAGNESIA) suspension 30 mL  30 mL Oral Daily PRN Kerry Hough, PA      . metFORMIN (GLUCOPHAGE-XR) 24 hr tablet 1,000 mg  1,000 mg Oral Daily Mike Craze, MD   1,000 mg at 06/12/12 0856  . pantoprazole (PROTONIX) EC tablet 80 mg  80 mg Oral Q1200 Mike Craze, MD   80 mg at 06/12/12 1113  . DISCONTD: lithium carbonate capsule 300 mg  300 mg Oral BID Mike Craze, MD   300 mg at 06/12/12 2725   Lab Results:  Results for orders placed during the hospital encounter of 06/08/12 (from the past 48 hour(s))  GLUCOSE, CAPILLARY     Status: Abnormal   Collection Time   06/11/12  6:06 AM      Component Value Range Comment   Glucose-Capillary 53 (*) 70 - 99 mg/dL     Comment 1 Notify RN     GLUCOSE, CAPILLARY     Status: Abnormal   Collection Time   06/11/12  6:38 AM      Component Value Range Comment   Glucose-Capillary 59 (*) 70 - 99 mg/dL    Comment 1 Notify RN     GLUCOSE, CAPILLARY     Status: Abnormal   Collection Time   06/11/12  6:53 AM      Component Value Range Comment   Glucose-Capillary 65 (*) 70 - 99 mg/dL   GLUCOSE, CAPILLARY     Status: Abnormal   Collection Time   06/11/12  7:18 AM      Component Value Range Comment   Glucose-Capillary 122 (*) 70 - 99 mg/dL   GLUCOSE, CAPILLARY     Status: Normal   Collection Time   06/11/12 11:46 AM      Component Value Range Comment   Glucose-Capillary 73  70 - 99 mg/dL   GLUCOSE, CAPILLARY     Status: Normal   Collection Time   06/11/12  5:09 PM      Component Value Range Comment   Glucose-Capillary 85  70 - 99 mg/dL   GLUCOSE, CAPILLARY     Status: Abnormal   Collection Time   06/11/12  9:56 PM      Component Value Range Comment   Glucose-Capillary 149 (*) 70 - 99 mg/dL   GLUCOSE, CAPILLARY     Status: Abnormal   Collection Time   06/12/12  6:34 AM      Component Value Range Comment   Glucose-Capillary 109 (*) 70 - 99 mg/dL    Physical Findings: AIMS: Facial and Oral Movements Muscles of Facial Expression: None, normal Lips and Perioral Area: None, normal Jaw: None, normal Tongue: None, normal,Extremity Movements Upper (arms, wrists, hands, fingers): None, normal Lower (legs, knees, ankles, toes): None, normal, Trunk Movements Neck, shoulders, hips: None, normal, Overall Severity Severity of abnormal movements (highest score from questions above): None, normal Incapacitation due to abnormal movements: None, normal Patient's awareness of abnormal movements (rate only patient's report): No Awareness, Dental Status Current problems with teeth and/or dentures?: No Does patient usually wear dentures?: No   Review of Systems:  Neurological: No headaches, seizures or dizziness  reported.  G.I.: The patient denies any constipation or stomach upset today.  Musculoskeletal: The patient denies any musculoskeletal related issues.   Time was spent today discussing with the patient her current symptoms. The patient reports to having some difficulty initiating and maintaining sleep at night. The patient reports that her appetite is improving but is "  up and down."  The patient reports moderate feelings of sadness, anhedonia and depressed mood. She denies any suicidal or homicidal ideations today and denies any auditory or visual hallucinations or delusional thinking.  She reports moderate symptoms of anxiety.   Treatment Plan Summary:  1. Daily contact with patient to assess and evaluate symptoms and progress in treatment.  2. Medication management  3. The patient will deny suicidal ideations or homicidal ideations for 48 hours prior to discharge and have a depression and anxiety rating of 3 or less. The patient will also deny any auditory or visual hallucinations or delusional thinking.  4. The patient will deny any symptoms of substance withdrawal at time of discharge.   Plan:  See about D/C tomorrow  Dan Humphreys, Reema Chick 06/12/2012, 11:32 PM

## 2012-06-12 NOTE — Progress Notes (Signed)
Pt reports she is trying to learn as much about Bipolar d/o as she can.  She says she is doing better this evening.  She asked about having her CBG checked and was informed that her CBGs had been discontinued.  She seemed concerned because she is still taking Metformin and glipizide.  Assured pt that we would check it in the AM to inform MD if it was too low.  Her affect this evening is anxious/wide-eyed.  She voices no other concerns at this time.  She denies SI/HI/AV.  Encouraged pt to make her needs known to staff.  Safety maintained with q15 minute checks.

## 2012-06-12 NOTE — Progress Notes (Signed)
D:  Patient's self inventory sheet, patient has poor sleep, improving appetite, low energy level, improving attention span.  Rated depression #5, hopelessness #8.  Denied withdrawals.   Denied SI.  Has experienced pain and headache in past 24 hours.  Worst pain #5.   After discharge, plans to be more aware of changes in mood.  "Just finding out I am bipolar which is what landed me here."  Unsure of discharge plans.  Financially is sometimes issue for medications. A:  Medications given per MD order.  Support and encouragement given throughout day.  Support and safety checks completed as ordered. R:  Following treatment plan.  Denied SI and HI.   Denied A/V hallucinations.  Patient remains safe and receptive on unit.

## 2012-06-12 NOTE — Progress Notes (Signed)
Writer entered patients room and observed her lying across the bed reading. Patient had her blood sugar checked and was 149. Patient was encouraged to take in extra snack due to her blood sugar being at 85 and she had a hypoglycemic event on yesterday morning. Patient reported decreased feelings of nausea today and is concerned as to why her blood sugars are dropping. Writer spent 1:1 time with patient and discussed proper food intake for diabetics and how to manage her blood sugars. Support and encouragement offered. Patient currently denies si/hi/a/v hallucinations. Safety maintained on unit, will continue to monitor.

## 2012-06-12 NOTE — Progress Notes (Signed)
Group Note  Date:  06/12/2012 Time:  1:15  Group Topic/Focus:  Overcoming Obstacles to Wellness  Participation Level:  Active  Participation Quality:  Appropriate  Affect:  Appropriate  Cognitive:  Appropriate  Insight:  Good  Engagement in Group:  Good  Additional Comments:  Linda English appeared to pay attention throughout group. She shared about her concern with a recent Bipolar diagnosis and asked about how to respond to it. She reported that she is seeing a mental health professional in the community and that she has a psychiatrist too.  [name] S 06/12/2012, 3:10 PM

## 2012-06-13 LAB — GLUCOSE, CAPILLARY: Glucose-Capillary: 87 mg/dL (ref 70–99)

## 2012-06-13 MED ORDER — LITHIUM CARBONATE 300 MG PO CAPS
300.0000 mg | ORAL_CAPSULE | Freq: Three times a day (TID) | ORAL | Status: DC
  Administered 2012-06-13 (×2): 300 mg via ORAL
  Filled 2012-06-13 (×6): qty 1

## 2012-06-13 MED ORDER — BUPROPION HCL ER (XL) 150 MG PO TB24: 450.0000 mg | ORAL_TABLET | Freq: Every day | ORAL | Status: DC

## 2012-06-13 MED ORDER — LITHIUM CARBONATE 300 MG PO CAPS: 300.0000 mg | ORAL_CAPSULE | Freq: Three times a day (TID) | ORAL | Status: DC

## 2012-06-13 MED ORDER — ESOMEPRAZOLE MAGNESIUM 40 MG PO CPDR: 40.0000 mg | DELAYED_RELEASE_CAPSULE | Freq: Two times a day (BID) | ORAL | Status: DC

## 2012-06-13 MED ORDER — HYDROXYZINE HCL 25 MG PO TABS: 25.0000 mg | ORAL_TABLET | Freq: Every evening | ORAL | Status: DC | PRN

## 2012-06-13 MED ORDER — AMLODIPINE BESY-BENAZEPRIL HCL 5-20 MG PO CAPS: 1.0000 | ORAL_CAPSULE | Freq: Every day | ORAL | Status: DC

## 2012-06-13 MED ORDER — GLIPIZIDE ER 10 MG PO TB24: 10.0000 mg | ORAL_TABLET | Freq: Every day | ORAL | Status: DC

## 2012-06-13 MED ORDER — METFORMIN HCL ER 500 MG PO TB24: 1000.0000 mg | ORAL_TABLET | Freq: Every day | ORAL | Status: DC
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
## 2012-06-13 NOTE — Progress Notes (Signed)
[facility] Case Management Discharge Plan:  Will you be returning to the same living situation after discharge: Yes,  Patient to return home with family At discharge, do you have transportation home?:Yes,  Patient will arrange transportation home Do you have the ability to pay for your medications:Yes,  Patient has private insurance and can make co-pay  Interagency Information:     Release of information consent forms completed and in the chart;  Patient's signature needed at discharge.  Patient to Follow up at:  Follow-up Information    Follow up with Dr. [facility] - [facility]. On 06/22/2012. (You are scheduled with Dr. Evelene Croon on Thursday, June 22, 2012 at 9:30 AM.)    Contact information:   [address] [place], [place]  [address]  [phone]      Follow up with [facility] . On 06/15/2012. (You are scheduled to see Jasmine December at [place] on Thursday, June 15, 2012)    Contact information:   [address] [place], [place]  [address]  [phone]         Patient denies SI/HI:   Yes,  Patient is not endorsing SI/HI or other thoughts of self harm    Safety Planning and Suicide Prevention discussed:  Yes,  Reviewed during aftercare groups  Barrier to discharge identified:Yes,  Limited family support  Summary and Recommendations: Patient encouraged to be compliant with medications and to follow up with all outpatient recommendations.    Wynn Banker 06/13/2012, 9:58 AM
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
## 2012-06-13 NOTE — Tx Team (Signed)
Interdisciplinary Treatment Plan Update (Adult)  Date:  06/13/2012  Time Reviewed:  9:48 AM   Progress in Treatment: Attending groups:   Yes   Participating in groups:  Yes Taking medication as prescribed:  Yes Tolerating medication:  Yes Family/Significant othe contact made: Contact made with family Patient understands diagnosis:  Yes Discussing patient identified problems/goals with staff: Yes Medical problems stabilized or resolved: Yes Denies suicidal/homicidal ideation:Yes Issues/concerns per patient self-inventory:  Other:  New problem(s) identified:  Reason for Continuation of Hospitalization:  Interventions implemented related to continuation of hospitalization:  Additional comments:  Estimated length of stay:  Discharge home today  Discharge Plan:  Outpatient follow up  New goal(s):  Review of initial/current patient goals per problem list:    1.  Goal(s):  Eliminate SI/other thoughts of self harm   Met:  Yes  Target date: d/c  As evidenced by: Patient no longer endorsing SI/HI or other thoughts of self harm.    2.  Goal (s): Reduce depression/anxiety (rated at one three and five)   Met:  Yes  Target date: d/c  As evidenced by: Patient currently rating symptoms at four or below    3.  Goal(s): .stabilize on meds   Met:  Yes  Target date: d/c  As evidenced by: Patient reports being stabilized on medications - less symptomatic    4.  Goal(s): Refer for outpatient follow up   Met:  Yes  Target date: d/c  As evidenced by: Follow up appointment scheduled   Attendees: Patient:  Linda English 06/13/2012  9:56 AM  Nursing:  Quintella Reichert, RN 06/13/2012 9:56 AM  Physician:  Orson Aloe, MD 06/13/2012 9:48 AM   Nursing:   Dennison Nancy, RN 06/13/2012 9:48 AM   CaseManager:  Juline Patch, LCSW 06/13/2012 9:48 AM   Counselor:  Angus Palms, LCSW 06/13/2012 9:48 AM   Other:  Jacques Navy, RN      06/13/2012 9:57 AM

## 2012-06-13 NOTE — Progress Notes (Signed)
Psychoeducational Group Note  Date:  06/13/2012 Time:  1000  Group Topic/Focus:  Self Care:   The focus of this group is to help patients understand the importance of self-care in order to improve or restore emotional, physical, spiritual, interpersonal, and financial health.  Participation Level:  Active  Participation Quality:  Appropriate  Affect:  Depressed  Cognitive:  Alert, Appropriate and Oriented  Insight:  Good  Engagement in Group:  Good  Additional Comments:    Ralph Leyden 06/13/2012, 1:09 PM

## 2012-06-13 NOTE — Progress Notes (Addendum)
Discharge Note:  Patient's friend picked up patient and plans to go home.  Patient denied SI and HI.   Denied A/V hallucinations.   Denied pain.   Stated she has received all her discharge instructions and stated she understands all information.  Suicide prevention information given to patient.  Patient stated she appreciated all the staff has done to assist her while at [facility].  Patient received all her belongings, miscellaneous items, pink clothes bag, toiletries, jewelry, shoes, etc.  Patient has cooperative and pleasant.

## 2012-06-13 NOTE — Progress Notes (Signed)
[facility] Group Notes:  (Counselor/Nursing/MHT/Case Management/Adjunct) 06/13/2012  1:15pm-2:30pm Feelings About Diagnosis   Type of Therapy:  Group Therapy  Participation Level:  Did Not Attend     Regina Alexander, LCSW 06/13/2012  4:06 PM           

## 2012-06-13 NOTE — Progress Notes (Signed)
Psychoeducational Group Note  Date:  06/13/2012 Time:  2000  Group Topic/Focus:  Wrap-Up Group:   The focus of this group is to help patients review their daily goal of treatment and discuss progress on daily workbooks.  Participation Level:  Active  Participation Quality:  Appropriate  Affect:  Appropriate  Cognitive:  Appropriate  Insight:  Good  Engagement in Group:  Good  Additional Comments:  Patient stated that she had a good day because she was able to see her family. Patient admits to being a private person, but mentioned that today she socialized more and slept less.   [name] 06/13/2012, 12:38 AM

## 2012-06-13 NOTE — Progress Notes (Signed)
D:  Patient's self inventory sheet, patient need sleep medications, improving appetite, low energy level, good attention span.  Rated depression #4, no hopelessness.  Denied withdrawals.   Denied SI.  Has experienced lightheadedness, pain, headache in past 24 hours.  Zero pain goal.  Worst pain #1.  After discharge, plans to be open about her feelings.  No discharge plans.  Needs financial assistance with her meds. A:  Medications per MD order.  Support and encouragement given throughout day.  Support and safety checks completed as ordered. R:  Following treatment plans.   Denied SI and HI.  Denied A/V hallucinations.  Patient remains safe and receptive on unit.  Has attended and participated in groups today.   Patient has been cooperative and pleasant.   Patient is excited about being discharged today.

## 2012-06-14 NOTE — Progress Notes (Signed)
Patient Discharge Instructions:  After Visit Summary (AVS):   Faxed to:  06/14/12 Discharge Summary Note:   Faxed to:  06/14/12 Psychiatric Admission Assessment Note:   Faxed to:  06/14/12 Suicide Risk Assessment - Discharge Assessment:   Faxed to:  06/14/12 Faxed/Sent to the Next Level Care provider:  06/14/12  Faxed to [facility] @ [phone] & [facility] @ [phone]  Karleen Hampshire Brittini, 06/14/2012, 4:11 PM

## 2012-06-26 ENCOUNTER — Ambulatory Visit (INDEPENDENT_AMBULATORY_CARE_PROVIDER_SITE_OTHER): Payer: BC Managed Care – PPO | Admitting: Gastroenterology

## 2012-06-26 ENCOUNTER — Encounter: Payer: Self-pay | Admitting: Gastroenterology

## 2012-06-26 VITALS — BP 120/80 | HR 93 | Temp 97.6°F | Ht 60.0 in | Wt 173.2 lb

## 2012-06-26 DIAGNOSIS — C20 Malignant neoplasm of rectum: Secondary | ICD-10-CM

## 2012-06-26 DIAGNOSIS — R634 Abnormal weight loss: Secondary | ICD-10-CM

## 2012-06-26 DIAGNOSIS — R110 Nausea: Secondary | ICD-10-CM

## 2012-06-26 MED ORDER — PROMETHAZINE HCL 25 MG PO TABS: 25.0000 mg | ORAL_TABLET | Freq: Three times a day (TID) | ORAL | Status: DC | PRN

## 2012-06-26 MED ORDER — RABEPRAZOLE SODIUM 20 MG PO TBEC: 20.0000 mg | DELAYED_RELEASE_TABLET | Freq: Every day | ORAL | Status: DC

## 2012-06-26 NOTE — Progress Notes (Signed)
Referring Provider: Carylon Perches, MD Primary Care Physician:  Cranford Mon Primary Gastroenterologist: Dr. Jena Gauss   Chief Complaint  Patient presents with  . Follow-up    HPI:   56 year old female with anorexia, wt loss, likely secondary to psychiatric issues. Recently inpatient secondary to psych issues. Placed on Lithium. Down 3 more lbs. GES normal. Last EGD Dec 2012 with small hh, gastric polyps. Last TCS April 2012, tubular adenoma, surveillance needed in 2017. Hx of rectal cancer, s/p anterior resection. UGI in past noted dysmotility. Reglan d/c'ed as no evidence of gastroparesis.  Notes recent nausea and vomiting. Now on Lithium. States held off on Lithium for past three days, then noted nausea stopped yesterday. However, she states she was nauseated prior to Lithium. Notes she was on Byetta in the past, which made her sick. Thinks she was on Januvia and Victoza. N/V slowed up after changing medications.  Not a "thrower-upper". However, threw up after picking up 55 year old from school. Notes severe reflux with BID Nexium. +belching.  Sees therapist the 30th, saw psychiatrist last week. Supposed to see again in 2 weeks. States psychiatrist told her to stop Lithium for the next few weeks to see if will help nausea. Not eating 3 meals a day.   On Nexium BID. Has taken Prilosec, no other medications.   Past Medical History  Diagnosis Date  . Anxiety   . Allergic rhinitis   . Acid reflux   . IBS (irritable bowel syndrome)   . Depression   . Panic disorder   . Chronic headache   . Rectal cancer   . DM (diabetes mellitus)   . Hypertension   . Obstructive sleep apnea   . Lichen planus   . Arthritis     osteoarthritis  . Hiatal hernia   . Gastric erosions   . Diverticula of colon   . Hemorrhoids   . Tubular adenoma   . Gastric polyps     benign   . Bipolar disorder     Past Surgical History  Procedure Date  . Spine surgery 2001    L5,S1 HEMILAMINOTOMY AND DISCECTOMY/DR DEATON   . Cholecystectomy   . Low anterior rection 2003    RECTAL CANCER  . Cesarean section     X  2  . Appendectomy   . Colonoscopy 08/2007    DR Jena Gauss, friable anal canal hemorrhoids, surgical anastomosis at 3cm, distal scattered tics  . Esophagogastroduodenoscopy 05/2002    DR Jena Gauss, normal  . Colonoscopy 12/15/2010    anal papilla and internal hemorrhoids/diminutive polyp in the base of the cecum. Past, tubular adenoma.. Next colonoscopy due in April 2017.  . Abdominal hysterectomy   . Esophagogastroduodenoscopy 08/03/2011    small HH/ gastric polyps  . Back surgery     Current Outpatient Prescriptions  Medication Sig Dispense Refill  . amLODipine-benazepril (LOTREL) 5-20 MG per capsule Take 1 capsule by mouth daily.  30 capsule  0  . buPROPion (WELLBUTRIN XL) 150 MG 24 hr tablet Take 3 tablets (450 mg total) by mouth daily.  90 tablet  0  . esomeprazole (NEXIUM) 40 MG capsule Take 1 capsule (40 mg total) by mouth 2 (two) times daily.  60 capsule  0  . glipiZIDE (GLUCOTROL XL) 10 MG 24 hr tablet Take 1 tablet (10 mg total) by mouth daily.  30 tablet  0  . hydrOXYzine (ATARAX/VISTARIL) 25 MG tablet Take 1 tablet (25 mg total) by mouth at bedtime and may repeat dose one time if needed.  For insomnia.  30 tablet  0  . lamoTRIgine (LAMICTAL) 25 MG tablet Take 25 mg by mouth daily.      . metFORMIN (GLUCOPHAGE-XR) 500 MG 24 hr tablet Take 2 tablets (1,000 mg total) by mouth daily.  60 tablet  0  . lithium carbonate 300 MG capsule Take 1 capsule (300 mg total) by mouth 3 (three) times daily with meals.  90 capsule  0    Allergies as of 06/26/2012  . (No Known Allergies)    Family History  Problem Relation Age of Onset  . Colon cancer Paternal Grandfather 41  . Colon polyps Brother   . Colon polyps Cousin     History   Social History  . Marital Status: Divorced    Spouse Name: N/A    Number of Children: N/A  . Years of Education: N/A   Occupational History  . teacher     [employer]   Social History Main Topics  . Smoking status: Never Smoker   . Smokeless tobacco: Never Used  . Alcohol Use: No  . Drug Use: No  . Sexually Active: No   Other Topics Concern  . None   Social History Narrative  . None    Review of Systems: Gen: SEE HPI CV: Denies chest pain, palpitations, syncope, peripheral edema, and claudication. Resp: Denies dyspnea at rest, cough, wheezing, coughing up blood, and pleurisy. GI: Denies vomiting blood, jaundice, and fecal incontinence.   Denies dysphagia or odynophagia. Derm: Denies rash, itching, dry skin Psych: see HPI Heme: Denies bruising, bleeding, and enlarged lymph nodes.  Physical Exam: BP 120/80  Pulse 93  Temp 97.6 F (36.4 C) (Temporal)  Ht 5' (1.524 m)  Wt 173 lb 3.2 oz (78.563 kg)  BMI 33.83 kg/m2 General:   Alert and oriented. No distress noted. Pleasant and cooperative.  Head:  Normocephalic and atraumatic. Eyes:  Conjuctiva clear without scleral icterus. Mouth:  Oral mucosa pink and moist. Good dentition. No lesions. Heart:  S1, S2 present without murmurs, rubs, or gallops. Regular rate and rhythm. Abdomen:  +BS, soft, non-tender and non-distended. No rebound or guarding. No HSM or masses noted. Msk:  Symmetrical without gross deformities. Normal posture. Extremities:  Without edema. Neurologic:  Alert and  oriented x4;  grossly normal neurologically. Skin:  Intact without significant lesions or rashes. Psych:  Alert and cooperative. Normal mood and affect.

## 2012-06-26 NOTE — Patient Instructions (Addendum)
Continue taking Nexium twice a day. When samples of Dexilant are available, we will call you to trial this.   I have sent a prescription for Phenergan to your pharmacy to use short term.   We will see you back in 6 weeks.      Diet for Gastroesophageal Reflux Disease, Adult Reflux (acid reflux) is when acid from your stomach flows up into the esophagus. When acid comes in contact with the esophagus, the acid causes irritation and soreness (inflammation) in the esophagus. When reflux happens often or so severely that it causes damage to the esophagus, it is called gastroesophageal reflux disease (GERD). Nutrition therapy can help ease the discomfort of GERD. FOODS OR DRINKS TO AVOID OR LIMIT  Smoking or chewing tobacco. Nicotine is one of the most potent stimulants to acid production in the gastrointestinal tract.   Caffeinated and decaffeinated coffee and black tea.   Regular or low-calorie carbonated beverages or energy drinks (caffeine-free carbonated beverages are allowed).     Strong spices, such as black pepper, white pepper, red pepper, cayenne, curry powder, and chili powder.   Peppermint or spearmint.   Chocolate.   High-fat foods, including meats and fried foods. Extra added fats including oils, butter, salad dressings, and nuts. Limit these to less than 8 tsp per day.   Fruits and vegetables if they are not tolerated, such as citrus fruits or tomatoes.   Alcohol.   Any food that seems to aggravate your condition.  If you have questions regarding your diet, call your caregiver or a registered dietitian. OTHER THINGS THAT MAY HELP GERD INCLUDE:    Eating your meals slowly, in a relaxed setting.   Eating 5 to 6 small meals per day instead of 3 large meals.   Eliminating food for a period of time if it causes distress.   Not lying down until 3 hours after eating a meal.   Keeping the head of your bed raised 6 to 9 inches (15 to 23 cm) by using a foam wedge or blocks  under the legs of the bed. Lying flat may make symptoms worse.   Being physically active. Weight loss may be helpful in reducing reflux in overweight or obese adults.   Wear loose fitting clothing  EXAMPLE MEAL PLAN This meal plan is approximately 2,000 calories based on https://www.[facility].org/ meal planning guidelines. Breakfast   cup cooked oatmeal.   1 cup strawberries.   1 cup low-fat milk.   1 oz almonds.  Snack  1 cup cucumber slices.   6 oz yogurt (made from low-fat or fat-free milk).  Lunch  2 slice whole-wheat bread.   2 oz sliced [place].   2 tsp mayonnaise.   1 cup blueberries.   1 cup snap peas.  Snack  6 whole-wheat crackers.   1 oz string cheese.  Dinner   cup brown rice.   1 cup mixed veggies.   1 tsp olive oil.   3 oz grilled fish.  Document Released: 08/16/2005 Document Revised: 11/08/2011 Document Reviewed: 07/02/2011 [facility] Patient Information 2013 [place], [place].

## 2012-06-28 NOTE — Progress Notes (Signed)
Faxed to PCP

## 2012-06-28 NOTE — Assessment & Plan Note (Signed)
Question r/t medication effect. Notes cessation of nausea after holding Lithium. Needs close f/u with psychiatrist as meds are adjusted. Trial of Dexilant in near future due to continued reflux symptoms. Continue Nexium BID until samples are available. 6 week f/u.

## 2012-06-28 NOTE — Assessment & Plan Note (Signed)
Surveillance April 2017.

## 2012-06-28 NOTE — Assessment & Plan Note (Signed)
Down an additional 3 lbs. Likely secondary to decreased po intake, TCS and EGD are up to date. Psychiatric issues compounding presentation. Needs to continue f/u with psychiatrist and psychologist. Return in 6 weeks.

## 2012-08-07 ENCOUNTER — Ambulatory Visit: Payer: Self-pay | Admitting: Gastroenterology

## 2012-09-05 ENCOUNTER — Ambulatory Visit: Payer: Self-pay | Admitting: Gastroenterology

## 2012-09-08 ENCOUNTER — Telehealth: Payer: Self-pay | Admitting: *Deleted

## 2012-09-08 NOTE — Telephone Encounter (Signed)
I left a message for Linda English to call our office to schedule an appointment for her TCS

## 2012-09-25 ENCOUNTER — Encounter: Payer: Self-pay | Admitting: Internal Medicine

## 2012-09-26 ENCOUNTER — Ambulatory Visit: Payer: Self-pay | Admitting: Urgent Care

## 2013-05-14 DIAGNOSIS — E663 Overweight: Secondary | ICD-10-CM | POA: Diagnosis not present

## 2013-05-14 DIAGNOSIS — Z1231 Encounter for screening mammogram for malignant neoplasm of breast: Secondary | ICD-10-CM | POA: Diagnosis not present

## 2013-05-14 DIAGNOSIS — N318 Other neuromuscular dysfunction of bladder: Secondary | ICD-10-CM | POA: Diagnosis not present

## 2013-05-14 DIAGNOSIS — N951 Menopausal and female climacteric states: Secondary | ICD-10-CM | POA: Diagnosis not present

## 2013-05-16 ENCOUNTER — Other Ambulatory Visit: Payer: Self-pay | Admitting: Obstetrics & Gynecology

## 2013-05-16 DIAGNOSIS — R928 Other abnormal and inconclusive findings on diagnostic imaging of breast: Secondary | ICD-10-CM

## 2013-05-31 ENCOUNTER — Other Ambulatory Visit: Payer: Self-pay | Admitting: Obstetrics & Gynecology

## 2013-05-31 ENCOUNTER — Ambulatory Visit
Admission: RE | Admit: 2013-05-31 | Discharge: 2013-05-31 | Disposition: A | Discharge status: Home / Self Care | Payer: Medicare Other | Source: Ambulatory Visit | Attending: Obstetrics & Gynecology | Admitting: Obstetrics & Gynecology

## 2013-05-31 DIAGNOSIS — R928 Other abnormal and inconclusive findings on diagnostic imaging of breast: Secondary | ICD-10-CM

## 2013-05-31 DIAGNOSIS — R921 Mammographic calcification found on diagnostic imaging of breast: Secondary | ICD-10-CM

## 2013-06-05 ENCOUNTER — Ambulatory Visit
Admission: RE | Admit: 2013-06-05 | Discharge: 2013-06-05 | Disposition: A | Discharge status: Home / Self Care | Payer: Medicare Other | Source: Ambulatory Visit | Attending: Obstetrics & Gynecology | Admitting: Obstetrics & Gynecology

## 2013-06-05 DIAGNOSIS — D249 Benign neoplasm of unspecified breast: Secondary | ICD-10-CM | POA: Diagnosis not present

## 2013-06-05 DIAGNOSIS — R928 Other abnormal and inconclusive findings on diagnostic imaging of breast: Secondary | ICD-10-CM | POA: Diagnosis not present

## 2013-06-05 DIAGNOSIS — N641 Fat necrosis of breast: Secondary | ICD-10-CM | POA: Diagnosis not present

## 2013-06-05 DIAGNOSIS — R921 Mammographic calcification found on diagnostic imaging of breast: Secondary | ICD-10-CM

## 2013-06-05 DIAGNOSIS — N6019 Diffuse cystic mastopathy of unspecified breast: Secondary | ICD-10-CM | POA: Diagnosis not present

## 2013-06-13 ENCOUNTER — Telehealth: Payer: Self-pay | Admitting: *Deleted

## 2013-06-13 NOTE — Telephone Encounter (Signed)
Pt's appt. Is 06/28/13 at 3:30 with anna sams

## 2013-06-13 NOTE — Telephone Encounter (Signed)
Surgery was done in [place].

## 2013-06-13 NOTE — Telephone Encounter (Signed)
Linda English, its been a year since we saw the pt in the office and this is a new complaint. Please schedule an appointment.

## 2013-06-13 NOTE — Telephone Encounter (Signed)
Pt called stating she has had surgery 11 years ago, pt wanted to know why she is oozing stuff out, pt states she had cancer and she had it done, surgery was done of her belly. Please advise [phone]

## 2013-06-15 DIAGNOSIS — E119 Type 2 diabetes mellitus without complications: Secondary | ICD-10-CM | POA: Diagnosis not present

## 2013-06-18 ENCOUNTER — Ambulatory Visit (INDEPENDENT_AMBULATORY_CARE_PROVIDER_SITE_OTHER): Payer: Medicare Other | Admitting: Gastroenterology

## 2013-06-18 ENCOUNTER — Encounter: Payer: Self-pay | Admitting: Gastroenterology

## 2013-06-18 ENCOUNTER — Encounter (INDEPENDENT_AMBULATORY_CARE_PROVIDER_SITE_OTHER): Payer: Self-pay

## 2013-06-18 VITALS — BP 118/71 | HR 89 | Temp 97.6°F | Ht 61.0 in | Wt 156.0 lb

## 2013-06-18 DIAGNOSIS — L24A9 Irritant contact dermatitis due friction or contact with other specified body fluids: Secondary | ICD-10-CM | POA: Insufficient documentation

## 2013-06-18 DIAGNOSIS — T148XXA Other injury of unspecified body region, initial encounter: Secondary | ICD-10-CM

## 2013-06-18 DIAGNOSIS — K589 Irritable bowel syndrome without diarrhea: Secondary | ICD-10-CM

## 2013-06-18 DIAGNOSIS — T8189XA Other complications of procedures, not elsewhere classified, initial encounter: Secondary | ICD-10-CM | POA: Insufficient documentation

## 2013-06-18 DIAGNOSIS — R634 Abnormal weight loss: Secondary | ICD-10-CM

## 2013-06-18 MED ORDER — NYSTATIN 100000 UNIT/GM EX CREA: TOPICAL_CREAM | Freq: Two times a day (BID) | CUTANEOUS | Status: DC

## 2013-06-18 MED ORDER — LINACLOTIDE 145 MCG PO CAPS: 145.0000 ug | ORAL_CAPSULE | Freq: Every day | ORAL | Status: DC

## 2013-06-18 NOTE — Patient Instructions (Signed)
Start taking Linzess each morning, 1 capsule daily on an empty stomach.   Use the Nystatin ointment at your belly button area twice a day for 10 days. Call if you see any further changes or issues.   Please complete the stool sample and return to our office.   We will see you back in 3 months!

## 2013-06-18 NOTE — Assessment & Plan Note (Signed)
Description of symptoms points to inadequate stool evacuation, incomplete evacuation, and need for a daily agent to combat constipation. Miralax has been utilized in the past. Trial of Linzess 145 mcg daily. TCS up-to-date with surveillance due in 2017. Obtain ifobt now. 3 month f/u. No concerning signs such as hematochezia; see weight loss.

## 2013-06-18 NOTE — Progress Notes (Signed)
Primary Care Physician:  Cranford Mon Primary GI: Dr. Jena Gauss   Chief Complaint  Patient presents with  . Follow-up    HPI:   Linda English presents today in follow-up with a history of anorexia and weight loss felt to be secondary to psychiatric in nature. GES normal. Last EGD 2012, TCS April 2012. Hx of rectal cancer, s/p anterior resection. Surveillance due 2017. Weight 173 in Oct 2013, now 156.  Called into our office with reports of "drainage".  Intermittent constipation, incomplete bowel movement, then "boom", soft stool. BM every day to every 2-3 days. Abdominal cramping, no overt rectal bleeding. Sees Dr. Ouida Sills quarterly, last was 161 in his office. States midline incision from prior surgery has been oozing. States sides of incision had gotten "raw", vague description of drainage. Started cleaning with alcohol, peroxide. Says an actual hole is present. Surgery 11 years ago, had to pack it back then. Notes recurrent drainage last week. Dr. Selena Batten in [place] performed procedure for rectal cancer. Poor oral intake, eats once to twice a day. Notes early satiety. No desire to eat.    Past Medical History  Diagnosis Date  . Anxiety   . Allergic rhinitis   . Acid reflux   . IBS (irritable bowel syndrome)   . Depression   . Panic disorder   . Chronic headache   . Rectal cancer   . DM (diabetes mellitus)   . Hypertension   . Obstructive sleep apnea   . Lichen planus   . Arthritis     osteoarthritis  . Hiatal hernia   . Gastric erosions   . Diverticula of colon   . Hemorrhoids   . Tubular adenoma   . Gastric polyps     benign   . Bipolar disorder     Past Surgical History  Procedure Laterality Date  . Spine surgery  2001    L5,S1 HEMILAMINOTOMY AND DISCECTOMY/DR DEATON  . Cholecystectomy    . Low anterior rection  2003    RECTAL CANCER  . Cesarean section      X  2  . Appendectomy    . Colonoscopy  08/2007    DR Jena Gauss, friable anal canal hemorrhoids, surgical  anastomosis at 3cm, distal scattered tics  . Esophagogastroduodenoscopy  05/2002    DR Jena Gauss, normal  . Colonoscopy  12/15/2010    anal papilla and internal hemorrhoids/diminutive polyp in the base of the cecum. Past, tubular adenoma.. Next colonoscopy due in April 2017.  . Abdominal hysterectomy    . Esophagogastroduodenoscopy  08/03/2011    RMR: small HH/ gastric polyps  . Back surgery    . Colonoscopy  10/27/2005    RMR: Anal canal hemorrhoids. Surgical anastomosis at 3 cm from the anal verge appeared normal. Few scattered distal diverticula. The residual  colonic mucosa appeared normal. I suspect the patient bled from hemorrhoids    Current Outpatient Prescriptions  Medication Sig Dispense Refill  . carbamazepine (TEGRETOL XR) 200 MG 12 hr tablet Take 200 mg by mouth 2 (two) times daily.       Marland Kitchen esomeprazole (NEXIUM) 40 MG capsule Take 1 capsule (40 mg total) by mouth 2 (two) times daily.  60 capsule  0  . estradiol (ESTRACE) 0.5 MG tablet Take 0.5 mg by mouth daily.       Marland Kitchen glipiZIDE (GLUCOTROL XL) 10 MG 24 hr tablet Take 1 tablet (10 mg total) by mouth daily.  30 tablet  0  . lamoTRIgine (LAMICTAL) 200 MG  tablet Take 200 mg by mouth daily.       . metFORMIN (GLUCOPHAGE-XR) 500 MG 24 hr tablet Take 2 tablets (1,000 mg total) by mouth daily.  60 tablet  0  . oxybutynin (DITROPAN-XL) 10 MG 24 hr tablet Take 10 mg by mouth daily.       Marland Kitchen amLODipine-benazepril (LOTREL) 5-20 MG per capsule Take 1 capsule by mouth daily.  30 capsule  0  . buPROPion (WELLBUTRIN XL) 150 MG 24 hr tablet Take 3 tablets (450 mg total) by mouth daily.  90 tablet  0  . hydrOXYzine (ATARAX/VISTARIL) 25 MG tablet Take 1 tablet (25 mg total) by mouth at bedtime and may repeat dose one time if needed. For insomnia.  30 tablet  0  . Linaclotide (LINZESS) 145 MCG CAPS capsule Take 1 capsule (145 mcg total) by mouth daily. Take 30 minutes before breakfast  30 capsule  5  . lithium carbonate 300 MG capsule Take 1 capsule  (300 mg total) by mouth 3 (three) times daily with meals.  90 capsule  0  . nystatin cream (MYCOSTATIN) Apply topically 2 (two) times daily. To belly button  30 g  0  . promethazine (PHENERGAN) 25 MG tablet Take 1 tablet (25 mg total) by mouth every 8 (eight) hours as needed for nausea.  30 tablet  3   No current facility-administered medications for this visit.    Allergies as of 06/18/2013  . (No Known Allergies)    Family History  Problem Relation Age of Onset  . Colon cancer Paternal Grandfather 88  . Colon polyps Brother   . Colon polyps Cousin     History   Social History  . Marital Status: Divorced    Spouse Name: N/A    Number of Children: N/A  . Years of Education: N/A   Occupational History  . teacher     [employer]   Social History Main Topics  . Smoking status: Never Smoker   . Smokeless tobacco: Never Used  . Alcohol Use: No  . Drug Use: No  . Sexual Activity: No   Other Topics Concern  . None   Social History Narrative  . None    Review of Systems: As mentioned in HPI.   Physical Exam: BP 118/71  Pulse 89  Temp(Src) 97.6 F (36.4 C) (Oral)  Ht 5\' 1"  (1.549 m)  Wt 156 lb (70.761 kg)  BMI 29.49 kg/m2 General:   Alert and oriented. No distress noted. Pleasant and cooperative.  Head:  Normocephalic and atraumatic. Eyes:  Conjuctiva clear without scleral icterus. Heart:  S1, S2 present without murmurs, rubs, or gallops. Regular rate and rhythm. Abdomen:  +BS, soft, non-tender and non-distended. Well-healed midline surgical scar. About midway down incision is an area that seems to be consistent with where her umbilicus was; due to abdominal obesity, folds into thist area. To left of incision is a miniscule, superficial area of granulation tissue, about the size of the tip of a pencil/pen tip. Almost appears 'irritated' but no evidence of wound, tunneling, open wound.   Extremities:  Without edema. Neurologic:  Alert and  oriented x4;  grossly  normal neurologically. Skin:  Intact without significant lesions or rashes. Psych:  Alert and cooperative. Normal mood and affect.

## 2013-06-18 NOTE — Progress Notes (Signed)
cc'd to pcp 

## 2013-06-18 NOTE — Assessment & Plan Note (Signed)
Vague description of "oozing" at midline incision. Foul odor, no purulent drainage. On visual exam, it appears this area may be prone to irritation due to abdominal obesity/folds, moist environment. There is a very miniscule, tiny superficial irritated area to the left of midline incision without evidence of actual wound, tunneling, fistula, etc. As I am unable to appreciate any obvious area that could be causing output, I question intermittent yeast exacerbation due to moist environment. Will trial nystatin BID for 10 days. If patient has any recurrence of "drainage", she is to call me right away and we will see her urgently. Again, there is no evidence of an actual wound or area of drainage on exam.

## 2013-06-18 NOTE — Assessment & Plan Note (Signed)
Continued weight loss in the setting of underlying psychiatric history. EGD and TCS fairly up-to-date. Obtain ifobt now and consider early interval surveillance colonoscopy due to hx of rectal cancer in remote past.   Consider CT abd/pelvis at next visit in 3 months to evaluate for occult malignancy.

## 2013-06-20 DIAGNOSIS — Z23 Encounter for immunization: Secondary | ICD-10-CM | POA: Diagnosis not present

## 2013-06-20 DIAGNOSIS — E1149 Type 2 diabetes mellitus with other diabetic neurological complication: Secondary | ICD-10-CM | POA: Diagnosis not present

## 2013-06-20 DIAGNOSIS — I10 Essential (primary) hypertension: Secondary | ICD-10-CM | POA: Diagnosis not present

## 2013-06-28 ENCOUNTER — Ambulatory Visit: Payer: Self-pay | Admitting: Gastroenterology

## 2013-10-03 ENCOUNTER — Encounter: Payer: Self-pay | Admitting: Gastroenterology

## 2013-10-03 ENCOUNTER — Encounter (INDEPENDENT_AMBULATORY_CARE_PROVIDER_SITE_OTHER): Payer: Self-pay

## 2013-10-03 ENCOUNTER — Ambulatory Visit (INDEPENDENT_AMBULATORY_CARE_PROVIDER_SITE_OTHER): Payer: Medicare Other | Admitting: Gastroenterology

## 2013-10-03 VITALS — BP 133/78 | HR 86 | Temp 97.4°F | Ht 61.0 in | Wt 145.8 lb

## 2013-10-03 DIAGNOSIS — K589 Irritable bowel syndrome without diarrhea: Secondary | ICD-10-CM

## 2013-10-03 DIAGNOSIS — R110 Nausea: Secondary | ICD-10-CM

## 2013-10-03 DIAGNOSIS — K219 Gastro-esophageal reflux disease without esophagitis: Secondary | ICD-10-CM | POA: Diagnosis not present

## 2013-10-03 DIAGNOSIS — R630 Anorexia: Secondary | ICD-10-CM | POA: Diagnosis not present

## 2013-10-03 DIAGNOSIS — R634 Abnormal weight loss: Secondary | ICD-10-CM | POA: Diagnosis not present

## 2013-10-03 NOTE — Progress Notes (Signed)
Primary Care Physician: Dellis Filbert  Primary Gastroenterologist:  Garfield Cornea, MD   Chief Complaint  Patient presents with  . Follow-up    HPI: Linda English is a 58 y.o. female here for followup of anorexia/weight loss thought to be psychiatric in nature. Last seen October 2014. Weight 156 pounds at that time. 145 pounds now. Gastric emptying study in August 2013 has been normal. Esophagram in August 2013 showed moderate diffuse impairment of the esophageal motility. Last EGD in 2012 and colonoscopy April 2012. History of rectal cancer status post anterior resection. Surveillance colonoscopy due 05/2016. Back in October 2013 she weighed 173 pounds. She is no longer on glipizide due to low blood sugars.   Typically does not eat breakfast. Around 2pm eats. Usually eats something at nighttime because Latuda makes her feel sick. Has to force herself to eat.  Has range of BMs from solid to loose. Never feels like she has incomplete BM. Has been that way even before surgery for rectal cancer. Linzess, did not tolerate it. Linzess made stomach hurt, stools too loose. No melena, brbpr. Last labs 8 months ago. Still some heartburn on nexium bid. No melena, brbpr.  Current Outpatient Prescriptions  Medication Sig Dispense Refill  . amLODipine-benazepril (LOTREL) 5-20 MG per capsule Take 2 capsules by mouth daily.      . carbamazepine (TEGRETOL XR) 200 MG 12 hr tablet Take 200 mg by mouth 2 (two) times daily.       Marland Kitchen esomeprazole (NEXIUM) 40 MG capsule Take 1 capsule (40 mg total) by mouth 2 (two) times daily.  60 capsule  0  . estradiol (ESTRACE) 0.5 MG tablet Take 0.5 mg by mouth daily.       Marland Kitchen lamoTRIgine (LAMICTAL) 200 MG tablet Take 200 mg by mouth 2 (two) times daily.       . Lurasidone HCl 120 MG TABS Take 120 mg by mouth daily after supper.      . metFORMIN (GLUCOPHAGE-XR) 500 MG 24 hr tablet Take 2 tablets (1,000 mg total) by mouth daily.  60 tablet  0  . oxybutynin  (DITROPAN-XL) 10 MG 24 hr tablet Take 10 mg by mouth daily.        No current facility-administered medications for this visit.    Allergies as of 10/03/2013  . (No Known Allergies)   Past Medical History  Diagnosis Date  . Anxiety   . Allergic rhinitis   . Acid reflux   . IBS (irritable bowel syndrome)   . Depression   . Panic disorder   . Chronic headache   . Rectal cancer   . DM (diabetes mellitus)   . Hypertension   . Obstructive sleep apnea   . Lichen planus   . Arthritis     osteoarthritis  . Hiatal hernia   . Gastric erosions   . Diverticula of colon   . Hemorrhoids   . Tubular adenoma   . Gastric polyps     benign   . Bipolar disorder    Past Surgical History  Procedure Laterality Date  . Spine surgery  2001    L5,S1 HEMILAMINOTOMY AND DISCECTOMY/DR DEATON  . Cholecystectomy    . Low anterior rection  2003    RECTAL CANCER  . Cesarean section      X  2  . Appendectomy    . Colonoscopy  08/2007    DR Gala Romney, friable anal canal hemorrhoids, surgical anastomosis at 3cm, distal scattered tics  .  Esophagogastroduodenoscopy  05/2002    DR Gala Romney, normal  . Colonoscopy  12/15/2010    anal papilla and internal hemorrhoids/diminutive polyp in the base of the cecum. Past, tubular adenoma.. Next colonoscopy due in April 2017.  . Abdominal hysterectomy    . Esophagogastroduodenoscopy  08/03/2011    RMR: small HH/ gastric polyps  . Back surgery    . Colonoscopy  10/27/2005    RMR: Anal canal hemorrhoids. Surgical anastomosis at 3 cm from the anal verge appeared normal. Few scattered distal diverticula. The residual  colonic mucosa appeared normal. I suspect the patient bled from hemorrhoids   Family History  Problem Relation Age of Onset  . Colon cancer Paternal Grandfather 63  . Colon polyps Brother   . Colon polyps Cousin    History   Social History  . Marital Status: Divorced    Spouse Name: N/A    Number of Children: N/A  . Years of Education: N/A    Occupational History  . teacher     [employer]   Social History Main Topics  . Smoking status: Never Smoker   . Smokeless tobacco: Never Used  . Alcohol Use: No  . Drug Use: No  . Sexual Activity: No   Other Topics Concern  . None   Social History Narrative  . None    ROS:  General:see hpi ENT: Negative for hoarseness, difficulty swallowing , nasal congestion. CV: Negative for chest pain, angina, palpitations, dyspnea on exertion, peripheral edema.  Respiratory: Negative for dyspnea at rest, dyspnea on exertion, cough, sputum, wheezing.  GI: See history of present illness. GU:  Negative for dysuria, hematuria, urinary incontinence, urinary frequency, nocturnal urination.  Endo: see hpi  Physical Examination:   BP 133/78  Pulse 86  Temp(Src) 97.4 F (36.3 C) (Oral)  Ht 5\' 1"  (1.549 m)  Wt 145 lb 12.8 oz (66.134 kg)  BMI 27.56 kg/m2  General: Well-nourished, well-developed in no acute distress.  Eyes: No icterus. Mouth: Oropharyngeal mucosa moist and pink , no lesions erythema or exudate. Lungs: Clear to auscultation bilaterally.  Heart: Regular rate and rhythm, no murmurs rubs or gallops.  Abdomen: Bowel sounds are normal, nontender, nondistended, no hepatosplenomegaly or masses, no abdominal bruits or hernia , no rebound or guarding.   Extremities: No lower extremity edema. No clubbing or deformities. Neuro: Alert and oriented x 4   Skin: Warm and dry, no jaundice.   Psych: Alert and cooperative, normal mood and affect.

## 2013-10-03 NOTE — Patient Instructions (Signed)
1. Please have your blood work done. We will call you with results and discuss next step at that time. 2. Please returned stool specimen to our office.

## 2013-10-04 ENCOUNTER — Ambulatory Visit (INDEPENDENT_AMBULATORY_CARE_PROVIDER_SITE_OTHER): Payer: Medicare Other | Admitting: Gastroenterology

## 2013-10-04 DIAGNOSIS — K589 Irritable bowel syndrome without diarrhea: Secondary | ICD-10-CM | POA: Diagnosis not present

## 2013-10-04 LAB — CBC WITH DIFFERENTIAL/PLATELET
Basophils Absolute: 0 10*3/uL (ref 0.0–0.1)
Basophils Relative: 0 % (ref 0–1)
EOS PCT: 2 % (ref 0–5)
Eosinophils Absolute: 0.1 10*3/uL (ref 0.0–0.7)
HCT: 35.2 % — ABNORMAL LOW (ref 36.0–46.0)
HEMOGLOBIN: 11.9 g/dL — AB (ref 12.0–15.0)
LYMPHS PCT: 25 % (ref 12–46)
Lymphs Abs: 1.2 10*3/uL (ref 0.7–4.0)
MCH: 26.7 pg (ref 26.0–34.0)
MCHC: 33.8 g/dL (ref 30.0–36.0)
MCV: 79.1 fL (ref 78.0–100.0)
MONO ABS: 0.4 10*3/uL (ref 0.1–1.0)
MONOS PCT: 9 % (ref 3–12)
Neutro Abs: 3.1 10*3/uL (ref 1.7–7.7)
Neutrophils Relative %: 64 % (ref 43–77)
Platelets: 356 10*3/uL (ref 150–400)
RBC: 4.45 MIL/uL (ref 3.87–5.11)
RDW: 14.3 % (ref 11.5–15.5)
WBC: 4.8 10*3/uL (ref 4.0–10.5)

## 2013-10-04 LAB — COMPREHENSIVE METABOLIC PANEL
ALT: 9 U/L (ref 0–35)
AST: 9 U/L (ref 0–37)
Albumin: 3.9 g/dL (ref 3.5–5.2)
Alkaline Phosphatase: 100 U/L (ref 39–117)
BUN: 9 mg/dL (ref 6–23)
CO2: 30 mEq/L (ref 19–32)
CREATININE: 0.64 mg/dL (ref 0.50–1.10)
Calcium: 9.8 mg/dL (ref 8.4–10.5)
Chloride: 106 mEq/L (ref 96–112)
Glucose, Bld: 107 mg/dL — ABNORMAL HIGH (ref 70–99)
Potassium: 4.2 mEq/L (ref 3.5–5.3)
Sodium: 143 mEq/L (ref 135–145)
Total Bilirubin: 0.2 mg/dL (ref 0.2–1.2)
Total Protein: 6.5 g/dL (ref 6.0–8.3)

## 2013-10-04 LAB — TSH: TSH: 2.213 u[IU]/mL (ref 0.350–4.500)

## 2013-10-04 LAB — IFOBT (OCCULT BLOOD): IFOBT: NEGATIVE

## 2013-10-04 NOTE — Progress Notes (Signed)
Pt return IFOBT test and it was NEGATIVE. 

## 2013-10-05 ENCOUNTER — Encounter: Payer: Self-pay | Admitting: Gastroenterology

## 2013-10-05 NOTE — Assessment & Plan Note (Signed)
Continue weight loss as outlined above. Complains of lack of appetite. I suspect she does not consume enough calories however she does not feel this to be the case. She has some breakthrough heartburn on Nexium twice a day but I do not feel is enough to change her therapy at this point. Previously weight loss felt to be related to underlying psychiatric disease. She never completed her iFOBT. We will check labs. Based on these findings we'll consider further workup such as CT scan versus early surveillance colonoscopy.

## 2013-10-08 NOTE — Progress Notes (Signed)
cc'd to pcp 

## 2013-10-10 NOTE — Progress Notes (Signed)
Already addressed

## 2013-10-10 NOTE — Progress Notes (Signed)
Cannot close encounter due to generic provider listed.

## 2013-10-15 ENCOUNTER — Other Ambulatory Visit: Payer: Self-pay | Admitting: Gastroenterology

## 2013-10-15 DIAGNOSIS — K219 Gastro-esophageal reflux disease without esophagitis: Secondary | ICD-10-CM | POA: Diagnosis not present

## 2013-10-15 DIAGNOSIS — I10 Essential (primary) hypertension: Secondary | ICD-10-CM | POA: Diagnosis not present

## 2013-10-15 DIAGNOSIS — Z79899 Other long term (current) drug therapy: Secondary | ICD-10-CM | POA: Diagnosis not present

## 2013-10-15 DIAGNOSIS — E119 Type 2 diabetes mellitus without complications: Secondary | ICD-10-CM | POA: Diagnosis not present

## 2013-10-15 DIAGNOSIS — R634 Abnormal weight loss: Secondary | ICD-10-CM

## 2013-10-19 ENCOUNTER — Encounter (HOSPITAL_COMMUNITY): Payer: Self-pay

## 2013-10-19 ENCOUNTER — Ambulatory Visit (HOSPITAL_COMMUNITY)
Admission: RE | Admit: 2013-10-19 | Discharge: 2013-10-19 | Disposition: A | Discharge status: Home / Self Care | Payer: Medicare Other | Source: Ambulatory Visit | Attending: Gastroenterology | Admitting: Gastroenterology

## 2013-10-19 DIAGNOSIS — Z85048 Personal history of other malignant neoplasm of rectum, rectosigmoid junction, and anus: Secondary | ICD-10-CM | POA: Diagnosis not present

## 2013-10-19 DIAGNOSIS — C20 Malignant neoplasm of rectum: Secondary | ICD-10-CM | POA: Diagnosis not present

## 2013-10-19 DIAGNOSIS — R634 Abnormal weight loss: Secondary | ICD-10-CM | POA: Diagnosis not present

## 2013-10-19 DIAGNOSIS — Z9889 Other specified postprocedural states: Secondary | ICD-10-CM | POA: Diagnosis not present

## 2013-10-19 MED ORDER — IOHEXOL 300 MG/ML  SOLN
100.0000 mL | Freq: Once | INTRAMUSCULAR | Status: AC | PRN
Start: 2013-10-19 — End: 2013-10-19
  Administered 2013-10-19: 100 mL via INTRAVENOUS

## 2013-10-23 DIAGNOSIS — E119 Type 2 diabetes mellitus without complications: Secondary | ICD-10-CM | POA: Diagnosis not present

## 2013-10-23 DIAGNOSIS — I10 Essential (primary) hypertension: Secondary | ICD-10-CM | POA: Diagnosis not present

## 2013-10-31 ENCOUNTER — Ambulatory Visit (HOSPITAL_COMMUNITY)
Admission: RE | Admit: 2013-10-31 | Discharge: 2013-10-31 | Disposition: A | Discharge status: Home / Self Care | Payer: Medicare Other | Source: Ambulatory Visit | Attending: Gastroenterology | Admitting: Gastroenterology

## 2013-10-31 ENCOUNTER — Telehealth: Payer: Self-pay | Admitting: Gastroenterology

## 2013-10-31 DIAGNOSIS — M47814 Spondylosis without myelopathy or radiculopathy, thoracic region: Secondary | ICD-10-CM | POA: Diagnosis not present

## 2013-10-31 DIAGNOSIS — R634 Abnormal weight loss: Secondary | ICD-10-CM | POA: Insufficient documentation

## 2013-10-31 DIAGNOSIS — R918 Other nonspecific abnormal finding of lung field: Secondary | ICD-10-CM | POA: Diagnosis not present

## 2013-10-31 NOTE — Telephone Encounter (Signed)
Pt is aware. Lab order faxed to the lab. [facility] has already put in order for CXR and pt will go by and have it done.  Manuela Schwartz, please schedule ov with RMR.

## 2013-10-31 NOTE — Telephone Encounter (Signed)
Spoke with Dr. Gala Romney about patient's ongoing weight loss and current work-up.  Recommends check for celiac. He wants her to follow up with her oncologist for weight loss. ?need PET scan. CXR PA/lat: dx: wt loss, h/o colon cancer Encourage patient to eat every 3 hours. Patient likely not consuming adequate calories.  OV with RMR only 3 months.

## 2013-11-01 LAB — TISSUE TRANSGLUTAMINASE, IGA: TISSUE TRANSGLUTAMINASE AB, IGA: 1.2 U/mL (ref <20)

## 2013-11-01 LAB — IGA: IgA: 159 mg/dL (ref 69–380)

## 2013-11-12 NOTE — Telephone Encounter (Signed)
Reminder in epic °

## 2013-12-05 DIAGNOSIS — H52229 Regular astigmatism, unspecified eye: Secondary | ICD-10-CM | POA: Diagnosis not present

## 2013-12-05 DIAGNOSIS — H2510 Age-related nuclear cataract, unspecified eye: Secondary | ICD-10-CM | POA: Diagnosis not present

## 2013-12-05 DIAGNOSIS — E119 Type 2 diabetes mellitus without complications: Secondary | ICD-10-CM | POA: Diagnosis not present

## 2013-12-05 DIAGNOSIS — H5231 Anisometropia: Secondary | ICD-10-CM | POA: Diagnosis not present

## 2014-02-20 ENCOUNTER — Encounter: Payer: Self-pay | Admitting: Internal Medicine

## 2014-03-14 DIAGNOSIS — E119 Type 2 diabetes mellitus without complications: Secondary | ICD-10-CM | POA: Diagnosis not present

## 2014-03-19 DIAGNOSIS — I10 Essential (primary) hypertension: Secondary | ICD-10-CM | POA: Diagnosis not present

## 2014-03-19 DIAGNOSIS — E1129 Type 2 diabetes mellitus with other diabetic kidney complication: Secondary | ICD-10-CM | POA: Diagnosis not present

## 2014-03-19 DIAGNOSIS — E785 Hyperlipidemia, unspecified: Secondary | ICD-10-CM | POA: Diagnosis not present

## 2014-04-24 ENCOUNTER — Ambulatory Visit (INDEPENDENT_AMBULATORY_CARE_PROVIDER_SITE_OTHER): Payer: Medicare Other | Admitting: Gastroenterology

## 2014-04-24 ENCOUNTER — Encounter: Payer: Self-pay | Admitting: Gastroenterology

## 2014-04-24 ENCOUNTER — Encounter (INDEPENDENT_AMBULATORY_CARE_PROVIDER_SITE_OTHER): Payer: Self-pay

## 2014-04-24 VITALS — BP 156/77 | HR 68 | Temp 97.2°F | Ht 62.0 in | Wt 152.2 lb

## 2014-04-24 DIAGNOSIS — K219 Gastro-esophageal reflux disease without esophagitis: Secondary | ICD-10-CM

## 2014-04-24 DIAGNOSIS — K589 Irritable bowel syndrome without diarrhea: Secondary | ICD-10-CM

## 2014-04-24 MED ORDER — DICYCLOMINE HCL 10 MG PO CAPS: 10.0000 mg | ORAL_CAPSULE | Freq: Three times a day (TID) | ORAL | Status: DC

## 2014-04-24 NOTE — Patient Instructions (Signed)
1. Continue Nexium one to two daily for GERD. 2. Start dicyclomine one capsule up to four times daily for abdominal cramps and diarrhea. Hold for constipation. 3. Office visit in six months with Dr. Gala Romney.  Irritable Bowel Syndrome Irritable bowel syndrome (IBS) is caused by a disturbance of normal bowel function and is a common digestive disorder. You may also hear this condition called spastic colon, mucous colitis, and irritable colon. There is no cure for IBS. However, symptoms often gradually improve or disappear with a good diet, stress management, and medicine. This condition usually appears in late adolescence or early adulthood. Women develop it twice as often as men. CAUSES  After food has been digested and absorbed in the small intestine, waste material is moved into the large intestine, or colon. In the colon, water and salts are absorbed from the undigested products coming from the small intestine. The remaining residue, or fecal material, is held for elimination. Under normal circumstances, gentle, rhythmic contractions of the bowel walls push the fecal material along the colon toward the rectum. In IBS, however, these contractions are irregular and poorly coordinated. The fecal material is either retained too long, resulting in constipation, or expelled too soon, producing diarrhea. SIGNS AND SYMPTOMS  The most common symptom of IBS is abdominal pain. It is often in the lower left side of the abdomen, but it may occur anywhere in the abdomen. The pain comes from spasms of the bowel muscles happening too much and from the buildup of gas and fecal material in the colon. This pain:  Can range from sharp abdominal cramps to a dull, continuous ache.  Often worsens soon after eating.  Is often relieved by having a bowel movement or passing gas. Abdominal pain is usually accompanied by constipation, but it may also produce diarrhea. The diarrhea often occurs right after a meal or upon waking  up in the morning. The stools are often soft, watery, and flecked with mucus. Other symptoms of IBS include:  Bloating.  Loss of appetite.  Heartburn.  Backache.  Dull pain in the arms or shoulders.  Nausea.  Burping.  Vomiting.  Gas. IBS may also cause symptoms that are unrelated to the digestive system, such as:  Fatigue.  Headaches.  Anxiety.  Shortness of breath.  Trouble concentrating.  Dizziness. These symptoms tend to come and go. DIAGNOSIS  The symptoms of IBS may seem like symptoms of other, more serious digestive disorders. Your health care provider may want to perform tests to exclude these disorders.  TREATMENT Many medicines are available to help correct bowel function or relieve bowel spasms and abdominal pain. Among the medicines available are:  Laxatives for severe constipation and to help restore normal bowel habits.  Specific antidiarrheal medicines to treat severe or lasting diarrhea.  Antispasmodic agents to relieve intestinal cramps. Your health care provider may also decide to treat you with a mild tranquilizer or sedative during unusually stressful periods in your life. Your health care provider may also prescribe antidepressant medicine. The use of this medicine has been shown to reduce pain and other symptoms of IBS. Remember that if any medicine is prescribed for you, you should take it exactly as directed. Make sure your health care provider knows how well it worked for you. HOME CARE INSTRUCTIONS   Take all medicines as directed by your health care provider.  Avoid foods that are high in fat or oils, such as heavy cream, butter, frankfurters, sausage, and other fatty meats.  Avoid foods  that make you go to the bathroom, such as fruit, fruit juice, and dairy products.  Cut out carbonated drinks, chewing gum, and "gassy" foods such as beans and cabbage. This may help relieve bloating and burping.  Eat foods with bran, and drink plenty  of liquids with the bran foods. This helps relieve constipation.  Keep track of what foods seem to bring on your symptoms.  Avoid emotionally charged situations or circumstances that produce anxiety.  Start or continue exercising.  Get plenty of rest and sleep. Document Released: 08/16/2005 Document Revised: 08/21/2013 Document Reviewed: 04/05/2008 [facility] Patient Information 2015 [place], [place]. This information is not intended to replace advice given to you by your health care provider. Make sure you discuss any questions you have with your health care provider.

## 2014-04-24 NOTE — Progress Notes (Signed)
CC TO PCP °

## 2014-04-24 NOTE — Progress Notes (Signed)
Primary Care Physician: Dellis Filbert  Primary Gastroenterologist:    Chief Complaint  Patient presents with  . Follow-up    still has intermittent diarrhea and constipation    HPI: Linda English is a 58 y.o. female here for followup of irritable bowel syndrome.  Last seen in 09/2013 for weight loss/anorexia which is thought to be psychiatric in nature. She's had numerous studies in the past couple years including gastric emptying study which was normal, esophagram which showed moderate diffuse impairment of esophageal motility, EGD and colonoscopy in 2012. She has a history of rectal cancer status post anterior resection in 2003. Her surveillance colonoscopies is due in April 2017. Since her last office visit she had an unremarkable chest x-ray and CT scan for weight loss purposes. do not cover 2017. Celiac screen was negative. I. FOBT negative. Hemoglobin was 11.9. TSH normal.    She has been able to gain back 7 pounds. Appetite has improved. Couple of meals daily and snacks now. Complains of intermittent diarrhea and constipation, tendency towards diarrhea recently. Associated with fecal urgency. May have several days around with significant diarrhea and then go a couple of days without a bowel movement. Denies any melena or rectal bleeding. More diarrhea than constipation. No melena, brbpr. Abdominal cramps relieved with defecation. No nocturnal BMs. Feels like needs nexium BID for good control.    Current Outpatient Prescriptions  Medication Sig Dispense Refill  . amLODipine-benazepril (LOTREL) 5-20 MG per capsule Take 2 capsules by mouth daily.      Marland Kitchen atorvastatin (LIPITOR) 10 MG tablet Take 10 mg by mouth at bedtime.      . carbamazepine (TEGRETOL XR) 200 MG 12 hr tablet Take 200 mg by mouth 2 (two) times daily.       Marland Kitchen esomeprazole (NEXIUM) 40 MG capsule Take 1 capsule (40 mg total) by mouth 2 (two) times daily.  60 capsule  0  . estradiol (ESTRACE) 0.5 MG tablet Take 0.5  mg by mouth daily.       Marland Kitchen lamoTRIgine (LAMICTAL) 200 MG tablet Take 200 mg by mouth 2 (two) times daily.       . Lurasidone HCl 120 MG TABS Take 60 mg by mouth daily after supper.       . metFORMIN (GLUCOPHAGE-XR) 500 MG 24 hr tablet Take 500 mg by mouth 2 (two) times daily.      Marland Kitchen oxybutynin (DITROPAN-XL) 10 MG 24 hr tablet Take 10 mg by mouth daily.        No current facility-administered medications for this visit.    Allergies as of 04/24/2014  . (No Known Allergies)    ROS:  General: Negative for anorexia, weight loss, fever, chills, fatigue, weakness. ENT: Negative for hoarseness, difficulty swallowing , nasal congestion. CV: Negative for chest pain, angina, palpitations, dyspnea on exertion, peripheral edema.  Respiratory: Negative for dyspnea at rest, dyspnea on exertion, cough, sputum, wheezing.  GI: See history of present illness. GU:  Negative for dysuria, hematuria, urinary incontinence, urinary frequency, nocturnal urination.  Endo: Negative for unusual weight change.    Physical Examination:   BP 156/77  Pulse 68  Temp(Src) 97.2 F (36.2 C) (Oral)  Ht 5\' 2"  (1.575 m)  Wt 152 lb 3.2 oz (69.037 kg)  BMI 27.83 kg/m2  General: Well-nourished, well-developed in no acute distress.  Eyes: No icterus. Mouth: Oropharyngeal mucosa moist and pink , no lesions erythema or exudate. Lungs: Clear to auscultation bilaterally.  Heart: Regular rate  and rhythm, no murmurs rubs or gallops.  Abdomen: Bowel sounds are normal, nontender, nondistended, no hepatosplenomegaly or masses, no abdominal bruits or hernia , no rebound or guarding.   Extremities: No lower extremity edema. No clubbing or deformities. Neuro: Alert and oriented x 4   Skin: Warm and dry, no jaundice.   Psych: Alert and cooperative, normal mood and affect.

## 2014-04-24 NOTE — Assessment & Plan Note (Signed)
Tendency towards diarrhea at this point. Trial of anti-spasmodic as needed. Dicyclomine 10 mg up to 4 times a day as needed for cramps and diarrhea. Hold for constipation. Increase dietary fiber. Office visit in 6 months with Dr. Gala Romney. Call sooner if needed.

## 2014-04-24 NOTE — Assessment & Plan Note (Signed)
Continue Nexium one to 2 times daily for GERD. Doing well and voices need for ongoing nighttime dose most of the time. No change in therapy otherwise. Discussed antireflux measures. OV in 6 months with Dr. Gala Romney.

## 2014-06-24 DIAGNOSIS — Z23 Encounter for immunization: Secondary | ICD-10-CM | POA: Diagnosis not present

## 2014-07-17 DIAGNOSIS — Z79899 Other long term (current) drug therapy: Secondary | ICD-10-CM | POA: Diagnosis not present

## 2014-07-17 DIAGNOSIS — E785 Hyperlipidemia, unspecified: Secondary | ICD-10-CM | POA: Diagnosis not present

## 2014-07-17 DIAGNOSIS — E119 Type 2 diabetes mellitus without complications: Secondary | ICD-10-CM | POA: Diagnosis not present

## 2014-07-24 ENCOUNTER — Other Ambulatory Visit (HOSPITAL_COMMUNITY): Payer: Self-pay | Admitting: Internal Medicine

## 2014-07-24 DIAGNOSIS — E785 Hyperlipidemia, unspecified: Secondary | ICD-10-CM | POA: Diagnosis not present

## 2014-07-24 DIAGNOSIS — R1310 Dysphagia, unspecified: Secondary | ICD-10-CM | POA: Diagnosis not present

## 2014-07-24 DIAGNOSIS — E119 Type 2 diabetes mellitus without complications: Secondary | ICD-10-CM | POA: Diagnosis not present

## 2014-07-31 ENCOUNTER — Ambulatory Visit (HOSPITAL_COMMUNITY)
Admission: RE | Admit: 2014-07-31 | Discharge: 2014-07-31 | Disposition: A | Discharge status: Home / Self Care | Payer: Medicare Other | Source: Ambulatory Visit | Attending: Internal Medicine | Admitting: Internal Medicine

## 2014-07-31 DIAGNOSIS — E119 Type 2 diabetes mellitus without complications: Secondary | ICD-10-CM | POA: Diagnosis not present

## 2014-07-31 DIAGNOSIS — I10 Essential (primary) hypertension: Secondary | ICD-10-CM | POA: Insufficient documentation

## 2014-07-31 DIAGNOSIS — R1310 Dysphagia, unspecified: Secondary | ICD-10-CM

## 2014-07-31 DIAGNOSIS — R490 Dysphonia: Secondary | ICD-10-CM | POA: Diagnosis not present

## 2014-07-31 DIAGNOSIS — J984 Other disorders of lung: Secondary | ICD-10-CM | POA: Diagnosis not present

## 2014-07-31 DIAGNOSIS — Z9049 Acquired absence of other specified parts of digestive tract: Secondary | ICD-10-CM | POA: Insufficient documentation

## 2014-07-31 DIAGNOSIS — K219 Gastro-esophageal reflux disease without esophagitis: Secondary | ICD-10-CM | POA: Diagnosis not present

## 2014-07-31 DIAGNOSIS — Z90710 Acquired absence of both cervix and uterus: Secondary | ICD-10-CM | POA: Insufficient documentation

## 2014-07-31 DIAGNOSIS — E041 Nontoxic single thyroid nodule: Secondary | ICD-10-CM | POA: Diagnosis not present

## 2014-10-01 ENCOUNTER — Encounter: Payer: Self-pay | Admitting: Internal Medicine

## 2014-10-07 DIAGNOSIS — N951 Menopausal and female climacteric states: Secondary | ICD-10-CM | POA: Diagnosis not present

## 2014-10-07 DIAGNOSIS — Z6830 Body mass index (BMI) 30.0-30.9, adult: Secondary | ICD-10-CM | POA: Diagnosis not present

## 2014-10-07 DIAGNOSIS — N3281 Overactive bladder: Secondary | ICD-10-CM | POA: Diagnosis not present

## 2014-10-07 DIAGNOSIS — Z1231 Encounter for screening mammogram for malignant neoplasm of breast: Secondary | ICD-10-CM | POA: Diagnosis not present

## 2014-10-07 DIAGNOSIS — Z01419 Encounter for gynecological examination (general) (routine) without abnormal findings: Secondary | ICD-10-CM | POA: Diagnosis not present

## 2014-10-28 ENCOUNTER — Encounter: Payer: Self-pay | Admitting: Gastroenterology

## 2014-10-28 ENCOUNTER — Ambulatory Visit (INDEPENDENT_AMBULATORY_CARE_PROVIDER_SITE_OTHER): Payer: Medicare Other | Admitting: Gastroenterology

## 2014-10-28 VITALS — BP 144/87 | HR 92 | Temp 98.5°F | Ht 62.0 in | Wt 155.8 lb

## 2014-10-28 DIAGNOSIS — K5900 Constipation, unspecified: Secondary | ICD-10-CM

## 2014-10-28 DIAGNOSIS — K219 Gastro-esophageal reflux disease without esophagitis: Secondary | ICD-10-CM | POA: Diagnosis not present

## 2014-10-28 DIAGNOSIS — K589 Irritable bowel syndrome without diarrhea: Secondary | ICD-10-CM

## 2014-10-28 MED ORDER — RESTORA PO CAPS: 1.0000 | ORAL_CAPSULE | Freq: Every day | ORAL | Status: DC

## 2014-10-28 MED ORDER — LUBIPROSTONE 8 MCG PO CAPS: 8.0000 ug | ORAL_CAPSULE | Freq: Two times a day (BID) | ORAL | Status: DC

## 2014-10-28 NOTE — Assessment & Plan Note (Signed)
Doing well on zantac 150mg  bid for now. Advised if she looses efficacy then to call and we could try a different PPI.

## 2014-10-28 NOTE — Assessment & Plan Note (Addendum)
Tendency towards constipation for several months. Hold bentyl for constipation, hasn't been taking anyway. Fiber causes gas. Trial of low-dose Amitiza with plan to increase slowing. 93mcg daily for now. Samples and RX provided. She will call in couple of weeks with PR or sooner if symptoms worsen. For now, hold off on colonoscopy until planned for 11/2015 but discussed with patient that we may consider sooner if symptoms don't respond to regimen.  Return to the office in three months to see Dr. Gala Romney.

## 2014-10-28 NOTE — Progress Notes (Signed)
Primary Care Physician: [name], MD  Primary Gastroenterologist:  Garfield Cornea, MD   Chief Complaint  Patient presents with  . Follow-up    HPI: Linda English is a 60 y.o. female here for follow-up. She was last seen in August 2015. History of IBS. Review his history of weight loss about one year ago felt to be due to psychiatric in nature. She's had numerous studies in the past couple years including gastric emptying study which was normal, esophagram which showed moderate diffuse impairment of esophageal motility, EGD and colonoscopy in 2012. She has a history of rectal cancer status post anterior resection in 2003. Her surveillance colonoscopies is due in April 2017. Chest x-ray in March 2015 was unremarkable. CT of the abdomen pelvis with contrast in February 2015 showed prior low anterior colonic resection, no evidence of recurrent or metastatic disease. Celiac screen was negative.  Weight is up 3 pounds in the past 6 months.  Now more constipation. May go a couple of weeks without BM. Trying prune juice. Only took Bentyl once, caused stomach to hurt. Incomplete BMs at times, has to go back several times within few minutes. Urgency with loose, solid in past but not so much recently. Constipation for couple of months. Hemorrhoids irritated, prep H wipes. Nexium was causing gas bad. Switched to Zantac 150mg  BID working well. No vomiting. No dysphagia. Some intermittent abdominal cramps.   Current Outpatient Prescriptions  Medication Sig Dispense Refill  . amLODipine-benazepril (LOTREL) 5-20 MG per capsule Take 2 capsules by mouth daily.    . Amphetamine-Dextroamphetamine (AMPHETAMINE SALT COMBO PO) Take 1 tablet by mouth 2 (two) times daily.    Marland Kitchen aspirin 81 MG tablet Take 81 mg by mouth daily.    Marland Kitchen atorvastatin (LIPITOR) 10 MG tablet Take 10 mg by mouth at bedtime.    . carbamazepine (TEGRETOL XR) 200 MG 12 hr tablet Take 200 mg by mouth 2 (two) times daily.     .  clonazePAM (KLONOPIN) 1 MG tablet Take 1 mg by mouth 4 (four) times daily as needed for anxiety.    Marland Kitchen doxycycline (PERIOSTAT) 20 MG tablet Take 20 mg by mouth 2 (two) times daily.    Marland Kitchen estradiol (ESTRACE) 0.5 MG tablet Take 0.5 mg by mouth daily.     Marland Kitchen lamoTRIgine (LAMICTAL) 200 MG tablet Take 200 mg by mouth 2 (two) times daily.     . Lurasidone HCl 120 MG TABS Take 60 mg by mouth daily after supper.     . metFORMIN (GLUCOPHAGE-XR) 500 MG 24 hr tablet Take 500 mg by mouth 2 (two) times daily.    Marland Kitchen oxybutynin (DITROPAN-XL) 10 MG 24 hr tablet Take 10 mg by mouth daily.     . ranitidine (ZANTAC) 150 MG capsule Take 150 mg by mouth 2 (two) times daily.     No current facility-administered medications for this visit.    Allergies as of 10/28/2014 - Review Complete 10/28/2014  Allergen Reaction Noted  . Excedrin extra strength [aspirin-acetaminophen-caffeine] Other (See Comments) 10/28/2014   Past Medical History  Diagnosis Date  . Anxiety   . Allergic rhinitis   . Acid reflux   . IBS (irritable bowel syndrome)   . Depression   . Panic disorder   . Chronic headache   . DM (diabetes mellitus)   . Hypertension   . Obstructive sleep apnea   . Lichen planus   . Arthritis     osteoarthritis  . Hiatal hernia   .  Gastric erosions   . Diverticula of colon   . Hemorrhoids   . Tubular adenoma   . Gastric polyps     benign   . Bipolar disorder   . Rectal cancer 2003    ileostomy and reversal   Past Surgical History  Procedure Laterality Date  . Spine surgery  2001    L5,S1 HEMILAMINOTOMY AND DISCECTOMY/DR DEATON  . Cholecystectomy    . Low anterior rection  2003    RECTAL CANCER  . Cesarean section      X  2  . Appendectomy    . Colonoscopy  08/2007    DR Gala Romney, friable anal canal hemorrhoids, surgical anastomosis at 3cm, distal scattered tics  . Esophagogastroduodenoscopy  05/2002    DR Gala Romney, normal  . Colonoscopy  12/15/2010    anal papilla and internal  hemorrhoids/diminutive polyp in the base of the cecum. Past, tubular adenoma.. Next colonoscopy due in April 2017.  . Abdominal hysterectomy    . Esophagogastroduodenoscopy  08/03/2011    RMR: small HH/ gastric polyps  . Back surgery    . Colonoscopy  10/27/2005    RMR: Anal canal hemorrhoids. Surgical anastomosis at 3 cm from the anal verge appeared normal. Few scattered distal diverticula. The residual  colonic mucosa appeared normal. I suspect the patient bled from hemorrhoids    ROS:  General: Negative for anorexia, weight loss, fever, chills, fatigue, weakness. ENT: Negative for hoarseness, difficulty swallowing , nasal congestion. CV: Negative for chest pain, angina, palpitations, dyspnea on exertion, peripheral edema.  Respiratory: Negative for dyspnea at rest, dyspnea on exertion, cough, sputum, wheezing.  GI: See history of present illness. GU:  Negative for dysuria, hematuria, urinary incontinence, urinary frequency, nocturnal urination.  Endo: Negative for unusual weight change.    Physical Examination:   BP 144/87 mmHg  Pulse 92  Temp(Src) 98.5 F (36.9 C)  Ht 5\' 2"  (1.575 m)  Wt 155 lb 12.8 oz (70.67 kg)  BMI 28.49 kg/m2  General: Well-nourished, well-developed in no acute distress.  Eyes: No icterus. Mouth: Oropharyngeal mucosa moist and pink , no lesions erythema or exudate. Lungs: Clear to auscultation bilaterally.  Heart: Regular rate and rhythm, no murmurs rubs or gallops.  Abdomen: Bowel sounds are normal, nontender, nondistended, no hepatosplenomegaly or masses, no abdominal bruits or hernia , no rebound or guarding.   Extremities: No lower extremity edema. No clubbing or deformities. Neuro: Alert and oriented x 4   Skin: Warm and dry, no jaundice.   Psych: Alert and cooperative, normal mood and affect.

## 2014-10-28 NOTE — Patient Instructions (Signed)
1. Do not take Bentyl (dicyclomine) while constipated. It is for loose stool and abdominal cramps.  2. Start Amitiza one daily with food. You may increase to twice daily with food if needed. This is for constipation. Samples provided and RX sent to your pharmacy. 3. Call me in a couple of weeks if you feel your bowels are not better. 4. Takes Restora once daily for two weeks. Samples provided.  5. Return to the office in 3 months to see Dr. Gala Romney.

## 2014-10-29 NOTE — Progress Notes (Signed)
CC'ED TO PCP 

## 2014-11-21 ENCOUNTER — Telehealth: Payer: Self-pay | Admitting: Internal Medicine

## 2014-11-21 NOTE — Telephone Encounter (Signed)
Tried to call pt- LMOM 

## 2014-11-21 NOTE — Telephone Encounter (Signed)
PATIENT has NOT GONE TO THE BATHROOM IN 3 WEEKS.   Was seen here 11/04/14    Please advise [phone]

## 2014-11-21 NOTE — Telephone Encounter (Signed)
Ginger spoke with the pt- she told her that she has not had a good bm since she came in for her ov. She has had bm's but she has to strain to have a bm. Today she drank some milk and ate some ice cream, (she is lactose intolerant) to see if that would cause her to have a good bm and she said she is cramping now. She wants to know what she needs to do now. She was told that our office is closed tomorrow and if she has problems over the weekend she should go to the ED.

## 2014-11-25 ENCOUNTER — Telehealth: Payer: Self-pay

## 2014-11-25 DIAGNOSIS — Z79899 Other long term (current) drug therapy: Secondary | ICD-10-CM | POA: Diagnosis not present

## 2014-11-25 DIAGNOSIS — F329 Major depressive disorder, single episode, unspecified: Secondary | ICD-10-CM | POA: Diagnosis not present

## 2014-11-25 DIAGNOSIS — I10 Essential (primary) hypertension: Secondary | ICD-10-CM | POA: Diagnosis not present

## 2014-11-25 DIAGNOSIS — E119 Type 2 diabetes mellitus without complications: Secondary | ICD-10-CM | POA: Diagnosis not present

## 2014-11-25 DIAGNOSIS — K219 Gastro-esophageal reflux disease without esophagitis: Secondary | ICD-10-CM | POA: Diagnosis not present

## 2014-11-25 DIAGNOSIS — K589 Irritable bowel syndrome without diarrhea: Secondary | ICD-10-CM | POA: Diagnosis not present

## 2014-11-25 NOTE — Telephone Encounter (Signed)
Routing to EG- please see previous phone note.

## 2014-11-25 NOTE — Telephone Encounter (Signed)
#  4 boxes of amitiza are at the front desk for pt to pick up with instructions.   Please schedule ov.

## 2014-11-25 NOTE — Telephone Encounter (Signed)
Appointment made and appt card is on samples.

## 2014-11-25 NOTE — Telephone Encounter (Signed)
Spoke with patient, no good BM in two weeks. Feels distended. Not significant pain. No vomiting. Some regurgitation/reflux. Amitiza 87mcg BID not helping. Previously could not take Linzess.  Patient advised to start Miralax one capful every hour X 5. Alternate with additional cup of fluid on the 1/2 hour.  Dulcolax 10mg  at start of Miralax and 10mg  at the end. Can use glycerin suppository X 1 at the very beginning.   Patient can have Amitiza 79mcg BID, 7 days worth. Will come by to get samples.  Please have patient make a 2 week follow up OV to decide if colonoscopy will be necessary.

## 2014-11-25 NOTE — Telephone Encounter (Signed)
Pt called office again this am and states that she hasn't had a BM for 3 weeks. Seen previous telephone note on 11/21/2014. Pt states that she didn't go to the ER and the pain is not constant. Wants to know what she needs to do.

## 2014-11-26 ENCOUNTER — Telehealth (INDEPENDENT_AMBULATORY_CARE_PROVIDER_SITE_OTHER): Payer: Self-pay | Admitting: Internal Medicine

## 2014-11-26 NOTE — Telephone Encounter (Signed)
Patient called last night stating that her bowels have not moved in 4 weeks and she had been trying medication that were recommended to her by Ms. Neil Crouch PA. Patient denied vomiting. She was advised to take fleets enema 2. She is advised to go to ER if she starts vomiting otherwise will follow-up at [facility]

## 2014-11-27 ENCOUNTER — Telehealth: Payer: Self-pay

## 2014-11-27 NOTE — Telephone Encounter (Signed)
I spoke with the pt- she said her bowels finally started moving last night and she was up all night going to the bathroom. She is feeling much better now. She has not picked up the samples of amitiza yet and will do that today. She is aware of her next appt on 12/16/14 with LSL.

## 2014-11-27 NOTE — Telephone Encounter (Signed)
Please call and check on patient.

## 2014-11-27 NOTE — Telephone Encounter (Signed)
Pt called and states that she nausea, and her stomach is hurting. Wants to know if the amitiza will fix it.  States that she was dizzy yesterday and weak.  Felt better after she laid down and rested. Please Advise. Pt is coming to pick up samples today.

## 2014-11-27 NOTE — Telephone Encounter (Signed)
Thanks

## 2014-11-27 NOTE — Telephone Encounter (Signed)
Called pt and she is aware  ?

## 2014-11-27 NOTE — Telephone Encounter (Signed)
She is likely sore from all the BMs last night.  Encourage her to drink some Gatorade to replenish fluid and electrolyte loss from stools. Start Amitiza tomorrow with food. Call if symptoms worsen or do not resolve.

## 2014-11-28 NOTE — Telephone Encounter (Signed)
Pt called and states that she drank gatorade yesterday and then she began throwing up.  Told her to try to eat something light and take her Amitza to see if she could hold solid food down. Instructed that she may need to go to ER if she can't hold anything down. Please Advise.

## 2014-11-28 NOTE — Telephone Encounter (Signed)
Recommend her go to the ER if still vomiting. In the ER, consider abd film to rule out obstruction and labs to check electrolytes.

## 2014-11-28 NOTE — Telephone Encounter (Signed)
LMOM to call back

## 2014-12-03 ENCOUNTER — Other Ambulatory Visit: Payer: Self-pay

## 2014-12-03 ENCOUNTER — Ambulatory Visit (HOSPITAL_COMMUNITY)
Admission: RE | Admit: 2014-12-03 | Discharge: 2014-12-03 | Disposition: A | Discharge status: Home / Self Care | Payer: Medicare Other | Source: Ambulatory Visit | Attending: Gastroenterology | Admitting: Gastroenterology

## 2014-12-03 DIAGNOSIS — Z9049 Acquired absence of other specified parts of digestive tract: Secondary | ICD-10-CM | POA: Insufficient documentation

## 2014-12-03 DIAGNOSIS — R112 Nausea with vomiting, unspecified: Secondary | ICD-10-CM | POA: Diagnosis not present

## 2014-12-03 DIAGNOSIS — K5900 Constipation, unspecified: Secondary | ICD-10-CM

## 2014-12-03 DIAGNOSIS — E119 Type 2 diabetes mellitus without complications: Secondary | ICD-10-CM | POA: Diagnosis not present

## 2014-12-03 DIAGNOSIS — R109 Unspecified abdominal pain: Secondary | ICD-10-CM | POA: Diagnosis not present

## 2014-12-03 DIAGNOSIS — R1013 Epigastric pain: Secondary | ICD-10-CM

## 2014-12-03 DIAGNOSIS — I10 Essential (primary) hypertension: Secondary | ICD-10-CM | POA: Diagnosis not present

## 2014-12-03 NOTE — Telephone Encounter (Signed)
Pt called and states she is still not having a good bowel movement. Needs to know what she can do to get through this. Please advise. Pt states she is not having anymore vomiting.

## 2014-12-03 NOTE — Telephone Encounter (Signed)
Please verify what the patient is taking every day for her bowels. Please schedule plain abd film (flat/upright 2 view) to evaluate for stool load/obstruction.

## 2014-12-03 NOTE — Telephone Encounter (Signed)
Called pt and she states she is only taking the Amitiza at this time. abd films have been ordered.

## 2014-12-03 NOTE — Telephone Encounter (Signed)
Routing...

## 2014-12-04 ENCOUNTER — Other Ambulatory Visit: Payer: Self-pay | Admitting: Gastroenterology

## 2014-12-04 MED ORDER — LUBIPROSTONE 24 MCG PO CAPS: 24.0000 ug | ORAL_CAPSULE | Freq: Two times a day (BID) | ORAL | Status: DC

## 2014-12-04 NOTE — Progress Notes (Signed)
Quick Note:  No signs of obstruction but moderately increase stool load.  Make sure she is NOT taking Bentyl. Continue Amitiza 18mcg BID with food. I have sent in RX. Take Miralax 17 gram PO TID until BMs soft, then drop back to once or twice daily. If still without BM in 24 hours, then can take bisacodyl 5mg  tablet, 3 daily for 3 days.  Keep OV as scheduled.  Keep [place] informed of her progess especially if no results, abdominal pain, vomiting.    ______

## 2014-12-09 ENCOUNTER — Telehealth: Payer: Self-pay | Admitting: Internal Medicine

## 2014-12-09 NOTE — Telephone Encounter (Signed)
Pt said she still isn't having a bowel movement. Doesn't remember the last time she had one. Please call her at [phone]

## 2014-12-09 NOTE — Telephone Encounter (Signed)
I spoke with the pt, she has been taking the amitiza and the miralax but she did not try the bisacodyl 5mg . I went over those instructions for her again and she said she would try that and if it didn't help she would call me back. She is aware that she still needs to take the amitiza and the miralax.  She is aware of her appt on 12/16/14

## 2014-12-09 NOTE — Telephone Encounter (Signed)
Pt was returning call to JL. I told her that JL was with the NP at the moment, but she said she would call her back before she leaves today. Patient agreed.

## 2014-12-09 NOTE — Telephone Encounter (Signed)
I spoke with the pt.  

## 2014-12-09 NOTE — Telephone Encounter (Signed)
Tried to call pt- LMOM 

## 2014-12-10 NOTE — Telephone Encounter (Signed)
Noted. Is she taking the Miralax TID like I recommended on 12/04/14?

## 2014-12-10 NOTE — Telephone Encounter (Signed)
Yes she said she was taking it as directed.

## 2014-12-16 ENCOUNTER — Ambulatory Visit: Payer: Medicare Other | Admitting: Gastroenterology

## 2014-12-26 ENCOUNTER — Ambulatory Visit (INDEPENDENT_AMBULATORY_CARE_PROVIDER_SITE_OTHER): Payer: Medicare Other | Admitting: Gastroenterology

## 2014-12-26 ENCOUNTER — Encounter: Payer: Self-pay | Admitting: Gastroenterology

## 2014-12-26 VITALS — BP 135/81 | HR 121 | Temp 98.2°F | Ht 62.0 in | Wt 159.6 lb

## 2014-12-26 DIAGNOSIS — R194 Change in bowel habit: Secondary | ICD-10-CM

## 2014-12-26 DIAGNOSIS — C20 Malignant neoplasm of rectum: Secondary | ICD-10-CM | POA: Diagnosis not present

## 2014-12-26 DIAGNOSIS — K5900 Constipation, unspecified: Secondary | ICD-10-CM | POA: Diagnosis not present

## 2014-12-26 NOTE — Patient Instructions (Signed)
1. Continue Amitiza 39mcg twice daily with food. 2. Take Miralax one capful twice daily until regular soft stools, then drop back to once daily. 3. Call in two weeks and let me know how you are. If still poorly controlled, I would recommend a colonoscopy at that time.

## 2014-12-26 NOTE — Progress Notes (Signed)
Primary Care Physician:  [name], MD  Primary Gastroenterologist:  Garfield Cornea, MD   Chief Complaint  Patient presents with  . Constipation    HPI:  Linda English is a 59 y.o. female here for follow up. Last seen in 09/2014. H/O IBS, rectal cancer s/p resection in 2003. Her surveillance TCS is due in 11/2015. See OV note from 10/28/2014 for summary of previous work-up.  She has been having a difficult time having BM over the last couple of months. Complains of incomplete bowel movements, having to go back several times to complete within a few minutes. When we saw her in February she was having periods of no bowel movements for a couple weeks and then urgency with loose stools. Since that time she called in several times with inability to have a bowel movement for more than 2 weeks at a time. Previously did not tolerate Linzess due to stomach pain. Her Amitiza was increased from 8 g to 24 g twice a day. She tried a mini MiraLAX prep the really did not get significant relief with that. Encouraged her to take MiraLAX 3 times daily but she misunderstood these directions. Abdominal views in April showed increased colonic stool burden but no obstruction.  Today she complains of ongoing troubles having a bowel movement. Feels like there is something always in her rectum. Symptoms are moderate to severe. Occurring now for several months. Gradually getting worse. In the past has had periods of constipation but intermittent diarrhea and now just constipation. Symptoms associated with bloating, back pain, nausea. She has vomited couple of times. No melena rectal bleeding. Continues Amitiza 24 g twice a day. Taking MiraLAX only as needed stated didn't really help. Using Dulcolax suppositories at times. Nothing has provided her with relief.   Current Outpatient Prescriptions  Medication Sig Dispense Refill  . amLODipine-benazepril (LOTREL) 5-20 MG per capsule Take 2 capsules by mouth daily.    .  Amphetamine-Dextroamphetamine (AMPHETAMINE SALT COMBO PO) Take 1 tablet by mouth 2 (two) times daily.    Marland Kitchen aspirin 81 MG tablet Take 81 mg by mouth daily.    Marland Kitchen atorvastatin (LIPITOR) 10 MG tablet Take 10 mg by mouth at bedtime.    . carbamazepine (TEGRETOL XR) 200 MG 12 hr tablet Take 200 mg by mouth 2 (two) times daily.     . clonazePAM (KLONOPIN) 1 MG tablet Take 1 mg by mouth 4 (four) times daily as needed for anxiety.    Marland Kitchen doxycycline (PERIOSTAT) 20 MG tablet Take 20 mg by mouth 2 (two) times daily.    Marland Kitchen estradiol (ESTRACE) 0.5 MG tablet Take 0.5 mg by mouth daily.     Marland Kitchen lamoTRIgine (LAMICTAL) 200 MG tablet Take 200 mg by mouth 2 (two) times daily.     Marland Kitchen lubiprostone (AMITIZA) 24 MCG capsule Take 1 capsule (24 mcg total) by mouth 2 (two) times daily with a meal. 60 capsule 3  . Lurasidone HCl 120 MG TABS Take 60 mg by mouth daily after supper.     . metFORMIN (GLUCOPHAGE-XR) 500 MG 24 hr tablet Take 500 mg by mouth 2 (two) times daily.    Marland Kitchen oxybutynin (DITROPAN-XL) 10 MG 24 hr tablet Take 10 mg by mouth daily.     . ranitidine (ZANTAC) 150 MG capsule Take 150 mg by mouth 2 (two) times daily.    . bisacodyl (DULCOLAX) 5 MG EC tablet Take 5 mg by mouth daily as needed for moderate constipation.    . polyethylene glycol (  MIRALAX / GLYCOLAX) packet Take 17 g by mouth daily as needed.    . Probiotic Product (RESTORA) CAPS Take 1 capsule by mouth daily. (Patient not taking: Reported on 12/26/2014) 14 capsule 0   No current facility-administered medications for this visit.    Allergies as of 12/26/2014 - Review Complete 12/26/2014  Allergen Reaction Noted  . Excedrin extra strength [aspirin-acetaminophen-caffeine] Other (See Comments) 10/28/2014    Past Medical History  Diagnosis Date  . Anxiety   . Allergic rhinitis   . Acid reflux   . IBS (irritable bowel syndrome)   . Depression   . Panic disorder   . Chronic headache   . DM (diabetes mellitus)   . Hypertension   . Obstructive  sleep apnea   . Lichen planus   . Arthritis     osteoarthritis  . Hiatal hernia   . Gastric erosions   . Diverticula of colon   . Hemorrhoids   . Tubular adenoma   . Gastric polyps     benign   . Bipolar disorder   . Rectal cancer 2003    ileostomy and reversal    Past Surgical History  Procedure Laterality Date  . Spine surgery  2001    L5,S1 HEMILAMINOTOMY AND DISCECTOMY/DR DEATON  . Cholecystectomy    . Low anterior rection  2003    RECTAL CANCER  . Cesarean section      X  2  . Appendectomy    . Colonoscopy  08/2007    DR Gala Romney, friable anal canal hemorrhoids, surgical anastomosis at 3cm, distal scattered tics  . Esophagogastroduodenoscopy  05/2002    DR Gala Romney, normal  . Colonoscopy  12/15/2010    anal papilla and internal hemorrhoids/diminutive polyp in the base of the cecum. Past, tubular adenoma.. Next colonoscopy due in April 2017.  . Abdominal hysterectomy    . Esophagogastroduodenoscopy  08/03/2011    RMR: small HH/ gastric polyps  . Back surgery    . Colonoscopy  10/27/2005    RMR: Anal canal hemorrhoids. Surgical anastomosis at 3 cm from the anal verge appeared normal. Few scattered distal diverticula. The residual  colonic mucosa appeared normal. I suspect the patient bled from hemorrhoids    Family History  Problem Relation Age of Onset  . Colon cancer Paternal Grandfather 21  . Colon polyps Brother   . Colon polyps Cousin     History   Social History  . Marital Status: Divorced    Spouse Name: N/A  . Number of Children: N/A  . Years of Education: N/A   Occupational History  . teacher     [employer]   Social History Main Topics  . Smoking status: Never Smoker   . Smokeless tobacco: Never Used     Comment: Never smoked  . Alcohol Use: No  . Drug Use: No  . Sexual Activity: No   Other Topics Concern  . Not on file   Social History Narrative      ROS:  General: Negative for anorexia, weight loss, fever, chills, fatigue,  weakness. Eyes: Negative for vision changes.  ENT: Negative for hoarseness, difficulty swallowing , nasal congestion. CV: Negative for chest pain, angina, palpitations, dyspnea on exertion, peripheral edema.  Respiratory: Negative for dyspnea at rest, dyspnea on exertion, cough, sputum, wheezing.  GI: See history of present illness. GU:  Negative for dysuria, hematuria, urinary incontinence, urinary frequency, nocturnal urination.  MS: Negative for joint pain, + low back pain.  Derm: Negative for  rash or itching.  Neuro: Negative for weakness, abnormal sensation, seizure, frequent headaches, memory loss, confusion.  Psych: Negative for anxiety, depression, suicidal ideation, hallucinations.  Endo: Negative for unusual weight change.  Heme: Negative for bruising or bleeding. Allergy: Negative for rash or hives.    Physical Examination:  BP 135/81 mmHg  Pulse 121  Temp(Src) 98.2 F (36.8 C)  Ht 5\' 2"  (1.575 m)  Wt 159 lb 9.6 oz (72.394 kg)  BMI 29.18 kg/m2   General: Well-nourished, well-developed in no acute distress.  Head: Normocephalic, atraumatic.   Eyes: Conjunctiva pink, no icterus. Mouth: Oropharyngeal mucosa moist and pink , no lesions erythema or exudate. Neck: Supple without thyromegaly, masses, or lymphadenopathy.  Lungs: Clear to auscultation bilaterally.  Heart: Regular rate and rhythm, no murmurs rubs or gallops.  Abdomen: Bowel sounds are normal, nontender, nondistended, no hepatosplenomegaly or masses, no abdominal bruits or    hernia , no rebound or guarding.   Rectal: No masses in rectal vault. Normal external exam. Nontender. Brown stool heme negative.  Extremities: No lower extremity edema. No clubbing or deformities.  Neuro: Alert and oriented x 4 , grossly normal neurologically.  Skin: Warm and dry, no rash or jaundice.   Psych: Alert and cooperative, normal mood and affect.  Labs: Lab Results  Component Value Date   WBC 4.8 10/03/2013   HGB 11.9*  10/03/2013   HCT 35.2* 10/03/2013   MCV 79.1 10/03/2013   PLT 356 10/03/2013     Imaging Studies: Dg Abd 2 Views  12/03/2014   CLINICAL DATA:  One month history of abdominal pain and constipation, onset of nausea and vomiting 5 days ago when taking medication for abdominal symptoms; history of surgery for colonic malignancy 12 years ago; history of cholecystectomy.  EXAM: ABDOMEN - 2 VIEW  COMPARISON:  Abdominal pelvic CT scan of October 19, 2013  FINDINGS: There is moderately increased stool burden throughout the colon and rectum. Surgical suture material projects over the right aspect of the sacrum and just above the rectum. There are surgical clips to the left of the L3 and L4 vertebral bodies and in the gallbladder fossa. The bony structures exhibit no acute abnormalities but there is degenerative facet joint change at multiple mid and lower lumbar levels.  There are calcifications which overlie the right L3 and L5 transverse processes which on the February 2015 CT scan were seen to reflect phleboliths.  IMPRESSION: Increase colonic stool burden consistent with clinical constipation. There is no small or large bowel obstructive pattern. There is no ileus type pattern either.   Electronically Signed   By: David  Martinique   On: 12/03/2014 16:58

## 2014-12-30 ENCOUNTER — Telehealth: Payer: Self-pay

## 2014-12-30 NOTE — Progress Notes (Signed)
cc'ed to pcp °

## 2014-12-30 NOTE — Telephone Encounter (Signed)
Spoke with pt and she states she is using miralax and amitiza BID. States she will proceed with a TCS

## 2014-12-30 NOTE — Telephone Encounter (Signed)
Pt called the office and states she seen LSL on 12/26/2014 and that she has started the Miralax like she was told but has only had 1 bowel movement on Thursday.  Nothing since. States she doesn't know what to do.

## 2014-12-30 NOTE — Assessment & Plan Note (Signed)
59 year old female with history of remote rectal cancer, due for surveillance colonoscopy next year. Presents with worsening constipation. History of IBS with tendency between constipation and diarrhea. Patient states now she just has constipation and will go couple weeks at a time without a bowel movement. Complains of incomplete evacuation. Rectal pressure like she needs to have a bowel movement but nothing happens. Rectal exam unremarkable today. She reports having multiple bowel movements yesterday and finally getting some relief. She's not sure about pursuing colonoscopy quite yet. We discussed that if her symptoms do not go back to baseline but she really needs to go ahead and have a colonoscopy now rather than waiting till next year. She'll call in a couple weeks with a progress report. In the meantime she will add MiraLAX twice daily along with Amitiza 24 g twice daily. Call sooner with problems that she should touch base with [place] in a couple weeks once the how she's doing.

## 2014-12-30 NOTE — Telephone Encounter (Signed)
Please make sure she is still taking Amitiza 31mcg BID.  Should be taking Miralax BID. When I saw her Thursday, she told me she had several bowel movement within 24 hours of OV.   If she is following these instructions and feels like no improvement in there symptoms, then I would recommend her proceeding with colonoscopy.   Let me know so I can give orders if she agrees.

## 2014-12-30 NOTE — Telephone Encounter (Signed)
Proceed with colonoscopy.  Augment conscious sedation with Phenergan 25mg  IV 30 minutes before procedure due to polypharmacy.  Needs two full days of clear liquids.  Starting four days before colonoscopy take dulcolax 10mg  daily. Take Trilytely regular prep the day before TCS (4liters) Take additional 2 liters the morning of procedure (total of 6 liters). Day of prep: metfomrin 1/2 tablet BID.   Right now, continue Amitiza 2mcg BID. Increase Miralax to BID alternating with TID every other day.

## 2014-12-31 ENCOUNTER — Other Ambulatory Visit: Payer: Self-pay

## 2014-12-31 DIAGNOSIS — R194 Change in bowel habit: Secondary | ICD-10-CM

## 2014-12-31 DIAGNOSIS — K5900 Constipation, unspecified: Secondary | ICD-10-CM

## 2014-12-31 DIAGNOSIS — Z85048 Personal history of other malignant neoplasm of rectum, rectosigmoid junction, and anus: Secondary | ICD-10-CM

## 2014-12-31 MED ORDER — PEG 3350-KCL-NA BICARB-NACL 420 G PO SOLR: 4000.0000 mL | Freq: Once | ORAL | Status: DC

## 2014-12-31 NOTE — Telephone Encounter (Signed)
Spoke with pt and she is aware of all new recommendations. Pt is also aware of prep and colonoscopy details.   Pt to come by to pick up prep.

## 2015-01-13 ENCOUNTER — Telehealth: Payer: Self-pay | Admitting: Internal Medicine

## 2015-01-13 NOTE — Telephone Encounter (Signed)
[phone]  PATIENT CALLED INQUIRING ABOUT CONTINUING DULCOLAX.  DURING HER PREP AND WHILE SHE IS ON NO SOLIDS

## 2015-01-13 NOTE — Telephone Encounter (Signed)
Tried to call pt- LMOM 

## 2015-01-15 ENCOUNTER — Encounter (HOSPITAL_COMMUNITY): Payer: Self-pay | Admitting: *Deleted

## 2015-01-15 ENCOUNTER — Ambulatory Visit (HOSPITAL_COMMUNITY)
Admission: RE | Admit: 2015-01-15 | Discharge: 2015-01-15 | Disposition: A | Discharge status: Home / Self Care | Payer: Medicare Other | Source: Ambulatory Visit | Attending: Internal Medicine | Admitting: Internal Medicine

## 2015-01-15 ENCOUNTER — Encounter (HOSPITAL_COMMUNITY)
Admission: RE | Disposition: A | Discharge status: Home / Self Care | Payer: Self-pay | Source: Ambulatory Visit | Attending: Internal Medicine

## 2015-01-15 DIAGNOSIS — Z792 Long term (current) use of antibiotics: Secondary | ICD-10-CM | POA: Insufficient documentation

## 2015-01-15 DIAGNOSIS — Z800 Family history of malignant neoplasm of digestive organs: Secondary | ICD-10-CM | POA: Diagnosis not present

## 2015-01-15 DIAGNOSIS — R51 Headache: Secondary | ICD-10-CM | POA: Insufficient documentation

## 2015-01-15 DIAGNOSIS — Z9049 Acquired absence of other specified parts of digestive tract: Secondary | ICD-10-CM | POA: Insufficient documentation

## 2015-01-15 DIAGNOSIS — K589 Irritable bowel syndrome without diarrhea: Secondary | ICD-10-CM | POA: Diagnosis not present

## 2015-01-15 DIAGNOSIS — R194 Change in bowel habit: Secondary | ICD-10-CM | POA: Diagnosis not present

## 2015-01-15 DIAGNOSIS — F319 Bipolar disorder, unspecified: Secondary | ICD-10-CM | POA: Insufficient documentation

## 2015-01-15 DIAGNOSIS — Z7982 Long term (current) use of aspirin: Secondary | ICD-10-CM | POA: Insufficient documentation

## 2015-01-15 DIAGNOSIS — Z85048 Personal history of other malignant neoplasm of rectum, rectosigmoid junction, and anus: Secondary | ICD-10-CM | POA: Diagnosis not present

## 2015-01-15 DIAGNOSIS — E119 Type 2 diabetes mellitus without complications: Secondary | ICD-10-CM | POA: Diagnosis not present

## 2015-01-15 DIAGNOSIS — M1990 Unspecified osteoarthritis, unspecified site: Secondary | ICD-10-CM | POA: Diagnosis not present

## 2015-01-15 DIAGNOSIS — K219 Gastro-esophageal reflux disease without esophagitis: Secondary | ICD-10-CM | POA: Diagnosis not present

## 2015-01-15 DIAGNOSIS — G8929 Other chronic pain: Secondary | ICD-10-CM | POA: Insufficient documentation

## 2015-01-15 DIAGNOSIS — F419 Anxiety disorder, unspecified: Secondary | ICD-10-CM | POA: Diagnosis not present

## 2015-01-15 DIAGNOSIS — G4733 Obstructive sleep apnea (adult) (pediatric): Secondary | ICD-10-CM | POA: Insufficient documentation

## 2015-01-15 DIAGNOSIS — I10 Essential (primary) hypertension: Secondary | ICD-10-CM | POA: Diagnosis not present

## 2015-01-15 DIAGNOSIS — Z79899 Other long term (current) drug therapy: Secondary | ICD-10-CM | POA: Insufficient documentation

## 2015-01-15 DIAGNOSIS — K5900 Constipation, unspecified: Secondary | ICD-10-CM

## 2015-01-15 HISTORY — PX: COLONOSCOPY: SHX5424

## 2015-01-15 LAB — GLUCOSE, CAPILLARY: Glucose-Capillary: 109 mg/dL — ABNORMAL HIGH (ref 65–99)

## 2015-01-15 SURGERY — COLONOSCOPY
Anesthesia: Moderate Sedation

## 2015-01-15 MED ORDER — SODIUM CHLORIDE 0.9 % IV SOLN
INTRAVENOUS | Status: DC
  Administered 2015-01-15: 14:00:00 via INTRAVENOUS

## 2015-01-15 MED ORDER — MEPERIDINE HCL 100 MG/ML IJ SOLN
INTRAMUSCULAR | Status: DC | PRN
  Administered 2015-01-15: 50 mg via INTRAVENOUS
  Administered 2015-01-15: 25 mg via INTRAVENOUS
  Administered 2015-01-15: 50 mg via INTRAVENOUS

## 2015-01-15 MED ORDER — PROMETHAZINE HCL 25 MG/ML IJ SOLN
INTRAMUSCULAR | Status: AC
  Filled 2015-01-15: qty 1

## 2015-01-15 MED ORDER — STERILE WATER FOR IRRIGATION IR SOLN
Status: DC | PRN
  Administered 2015-01-15: 14:00:00

## 2015-01-15 MED ORDER — PROMETHAZINE HCL 25 MG/ML IJ SOLN
25.0000 mg | Freq: Once | INTRAMUSCULAR | Status: AC
  Administered 2015-01-15: 25 mg via INTRAVENOUS

## 2015-01-15 MED ORDER — MEPERIDINE HCL 100 MG/ML IJ SOLN
INTRAMUSCULAR | Status: AC
  Filled 2015-01-15: qty 2

## 2015-01-15 MED ORDER — ONDANSETRON HCL 4 MG/2ML IJ SOLN
INTRAMUSCULAR | Status: DC | PRN
  Administered 2015-01-15: 4 mg via INTRAVENOUS

## 2015-01-15 MED ORDER — ONDANSETRON HCL 4 MG/2ML IJ SOLN
INTRAMUSCULAR | Status: AC
  Filled 2015-01-15: qty 2

## 2015-01-15 MED ORDER — MIDAZOLAM HCL 5 MG/5ML IJ SOLN
INTRAMUSCULAR | Status: DC | PRN
  Administered 2015-01-15: 2 mg via INTRAVENOUS
  Administered 2015-01-15: 1 mg via INTRAVENOUS
  Administered 2015-01-15: 2 mg via INTRAVENOUS

## 2015-01-15 MED ORDER — MIDAZOLAM HCL 5 MG/5ML IJ SOLN
INTRAMUSCULAR | Status: AC
  Filled 2015-01-15: qty 10

## 2015-01-15 NOTE — Interval H&P Note (Signed)
History and Physical Interval Note:  01/15/2015 2:15 PM  Linda English  has presented today for surgery, with the diagnosis of constipation, hx of rectal cancer, change in bowel habits  The various methods of treatment have been discussed with the patient and family. After consideration of risks, benefits and other options for treatment, the patient has consented to  Procedure(s) with comments: COLONOSCOPY (N/A) - 200pm as a surgical intervention .  The patient's history has been reviewed, patient examined, no change in status, stable for surgery.  I have reviewed the patient's chart and labs.  Questions were answered to the patient's satisfaction.     Jackson Fetters  No change. Colonoscopy per plan.The risks, benefits, limitations, alternatives and imponderables have been reviewed with the patient. Questions have been answered. All parties are agreeable.

## 2015-01-15 NOTE — Telephone Encounter (Signed)
Linda English spoke with the pt yesterday.

## 2015-01-15 NOTE — H&P (View-Only) (Signed)
Primary Care Physician:  [name], MD  Primary Gastroenterologist:  Garfield Cornea, MD   Chief Complaint  Patient presents with  . Constipation    HPI:  Linda English is a 59 y.o. female here for follow up. Last seen in 09/2014. H/O IBS, rectal cancer s/p resection in 2003. Her surveillance TCS is due in 11/2015. See OV note from 10/28/2014 for summary of previous work-up.  She has been having a difficult time having BM over the last couple of months. Complains of incomplete bowel movements, having to go back several times to complete within a few minutes. When we saw her in February she was having periods of no bowel movements for a couple weeks and then urgency with loose stools. Since that time she called in several times with inability to have a bowel movement for more than 2 weeks at a time. Previously did not tolerate Linzess due to stomach pain. Her Amitiza was increased from 8 g to 24 g twice a day. She tried a mini MiraLAX prep the really did not get significant relief with that. Encouraged her to take MiraLAX 3 times daily but she misunderstood these directions. Abdominal views in April showed increased colonic stool burden but no obstruction.  Today she complains of ongoing troubles having a bowel movement. Feels like there is something always in her rectum. Symptoms are moderate to severe. Occurring now for several months. Gradually getting worse. In the past has had periods of constipation but intermittent diarrhea and now just constipation. Symptoms associated with bloating, back pain, nausea. She has vomited couple of times. No melena rectal bleeding. Continues Amitiza 24 g twice a day. Taking MiraLAX only as needed stated didn't really help. Using Dulcolax suppositories at times. Nothing has provided her with relief.   Current Outpatient Prescriptions  Medication Sig Dispense Refill  . amLODipine-benazepril (LOTREL) 5-20 MG per capsule Take 2 capsules by mouth daily.    .  Amphetamine-Dextroamphetamine (AMPHETAMINE SALT COMBO PO) Take 1 tablet by mouth 2 (two) times daily.    Marland Kitchen aspirin 81 MG tablet Take 81 mg by mouth daily.    Marland Kitchen atorvastatin (LIPITOR) 10 MG tablet Take 10 mg by mouth at bedtime.    . carbamazepine (TEGRETOL XR) 200 MG 12 hr tablet Take 200 mg by mouth 2 (two) times daily.     . clonazePAM (KLONOPIN) 1 MG tablet Take 1 mg by mouth 4 (four) times daily as needed for anxiety.    Marland Kitchen doxycycline (PERIOSTAT) 20 MG tablet Take 20 mg by mouth 2 (two) times daily.    Marland Kitchen estradiol (ESTRACE) 0.5 MG tablet Take 0.5 mg by mouth daily.     Marland Kitchen lamoTRIgine (LAMICTAL) 200 MG tablet Take 200 mg by mouth 2 (two) times daily.     Marland Kitchen lubiprostone (AMITIZA) 24 MCG capsule Take 1 capsule (24 mcg total) by mouth 2 (two) times daily with a meal. 60 capsule 3  . Lurasidone HCl 120 MG TABS Take 60 mg by mouth daily after supper.     . metFORMIN (GLUCOPHAGE-XR) 500 MG 24 hr tablet Take 500 mg by mouth 2 (two) times daily.    Marland Kitchen oxybutynin (DITROPAN-XL) 10 MG 24 hr tablet Take 10 mg by mouth daily.     . ranitidine (ZANTAC) 150 MG capsule Take 150 mg by mouth 2 (two) times daily.    . bisacodyl (DULCOLAX) 5 MG EC tablet Take 5 mg by mouth daily as needed for moderate constipation.    . polyethylene glycol (  MIRALAX / GLYCOLAX) packet Take 17 g by mouth daily as needed.    . Probiotic Product (RESTORA) CAPS Take 1 capsule by mouth daily. (Patient not taking: Reported on 12/26/2014) 14 capsule 0   No current facility-administered medications for this visit.    Allergies as of 12/26/2014 - Review Complete 12/26/2014  Allergen Reaction Noted  . Excedrin extra strength [aspirin-acetaminophen-caffeine] Other (See Comments) 10/28/2014    Past Medical History  Diagnosis Date  . Anxiety   . Allergic rhinitis   . Acid reflux   . IBS (irritable bowel syndrome)   . Depression   . Panic disorder   . Chronic headache   . DM (diabetes mellitus)   . Hypertension   . Obstructive  sleep apnea   . Lichen planus   . Arthritis     osteoarthritis  . Hiatal hernia   . Gastric erosions   . Diverticula of colon   . Hemorrhoids   . Tubular adenoma   . Gastric polyps     benign   . Bipolar disorder   . Rectal cancer 2003    ileostomy and reversal    Past Surgical History  Procedure Laterality Date  . Spine surgery  2001    L5,S1 HEMILAMINOTOMY AND DISCECTOMY/DR DEATON  . Cholecystectomy    . Low anterior rection  2003    RECTAL CANCER  . Cesarean section      X  2  . Appendectomy    . Colonoscopy  08/2007    DR Gala Romney, friable anal canal hemorrhoids, surgical anastomosis at 3cm, distal scattered tics  . Esophagogastroduodenoscopy  05/2002    DR Gala Romney, normal  . Colonoscopy  12/15/2010    anal papilla and internal hemorrhoids/diminutive polyp in the base of the cecum. Past, tubular adenoma.. Next colonoscopy due in April 2017.  . Abdominal hysterectomy    . Esophagogastroduodenoscopy  08/03/2011    RMR: small HH/ gastric polyps  . Back surgery    . Colonoscopy  10/27/2005    RMR: Anal canal hemorrhoids. Surgical anastomosis at 3 cm from the anal verge appeared normal. Few scattered distal diverticula. The residual  colonic mucosa appeared normal. I suspect the patient bled from hemorrhoids    Family History  Problem Relation Age of Onset  . Colon cancer Paternal Grandfather 46  . Colon polyps Brother   . Colon polyps Cousin     History   Social History  . Marital Status: Divorced    Spouse Name: N/A  . Number of Children: N/A  . Years of Education: N/A   Occupational History  . teacher     [employer]   Social History Main Topics  . Smoking status: Never Smoker   . Smokeless tobacco: Never Used     Comment: Never smoked  . Alcohol Use: No  . Drug Use: No  . Sexual Activity: No   Other Topics Concern  . Not on file   Social History Narrative      ROS:  General: Negative for anorexia, weight loss, fever, chills, fatigue,  weakness. Eyes: Negative for vision changes.  ENT: Negative for hoarseness, difficulty swallowing , nasal congestion. CV: Negative for chest pain, angina, palpitations, dyspnea on exertion, peripheral edema.  Respiratory: Negative for dyspnea at rest, dyspnea on exertion, cough, sputum, wheezing.  GI: See history of present illness. GU:  Negative for dysuria, hematuria, urinary incontinence, urinary frequency, nocturnal urination.  MS: Negative for joint pain, + low back pain.  Derm: Negative for  rash or itching.  Neuro: Negative for weakness, abnormal sensation, seizure, frequent headaches, memory loss, confusion.  Psych: Negative for anxiety, depression, suicidal ideation, hallucinations.  Endo: Negative for unusual weight change.  Heme: Negative for bruising or bleeding. Allergy: Negative for rash or hives.    Physical Examination:  BP 135/81 mmHg  Pulse 121  Temp(Src) 98.2 F (36.8 C)  Ht 5\' 2"  (1.575 m)  Wt 159 lb 9.6 oz (72.394 kg)  BMI 29.18 kg/m2   General: Well-nourished, well-developed in no acute distress.  Head: Normocephalic, atraumatic.   Eyes: Conjunctiva pink, no icterus. Mouth: Oropharyngeal mucosa moist and pink , no lesions erythema or exudate. Neck: Supple without thyromegaly, masses, or lymphadenopathy.  Lungs: Clear to auscultation bilaterally.  Heart: Regular rate and rhythm, no murmurs rubs or gallops.  Abdomen: Bowel sounds are normal, nontender, nondistended, no hepatosplenomegaly or masses, no abdominal bruits or    hernia , no rebound or guarding.   Rectal: No masses in rectal vault. Normal external exam. Nontender. Brown stool heme negative.  Extremities: No lower extremity edema. No clubbing or deformities.  Neuro: Alert and oriented x 4 , grossly normal neurologically.  Skin: Warm and dry, no rash or jaundice.   Psych: Alert and cooperative, normal mood and affect.  Labs: Lab Results  Component Value Date   WBC 4.8 10/03/2013   HGB 11.9*  10/03/2013   HCT 35.2* 10/03/2013   MCV 79.1 10/03/2013   PLT 356 10/03/2013     Imaging Studies: Dg Abd 2 Views  12/03/2014   CLINICAL DATA:  One month history of abdominal pain and constipation, onset of nausea and vomiting 5 days ago when taking medication for abdominal symptoms; history of surgery for colonic malignancy 12 years ago; history of cholecystectomy.  EXAM: ABDOMEN - 2 VIEW  COMPARISON:  Abdominal pelvic CT scan of October 19, 2013  FINDINGS: There is moderately increased stool burden throughout the colon and rectum. Surgical suture material projects over the right aspect of the sacrum and just above the rectum. There are surgical clips to the left of the L3 and L4 vertebral bodies and in the gallbladder fossa. The bony structures exhibit no acute abnormalities but there is degenerative facet joint change at multiple mid and lower lumbar levels.  There are calcifications which overlie the right L3 and L5 transverse processes which on the February 2015 CT scan were seen to reflect phleboliths.  IMPRESSION: Increase colonic stool burden consistent with clinical constipation. There is no small or large bowel obstructive pattern. There is no ileus type pattern either.   Electronically Signed   By: David  Martinique   On: 12/03/2014 16:58

## 2015-01-15 NOTE — Discharge Instructions (Signed)
Colonoscopy Discharge Instructions  Read the instructions outlined below and refer to this sheet in the next few weeks. These discharge instructions provide you with general information on caring for yourself after you leave the hospital. Your doctor may also give you specific instructions. While your treatment has been planned according to the most current medical practices available, unavoidable complications occasionally occur. If you have any problems or questions after discharge, call Dr. Gala Romney at [phone]. ACTIVITY  You may resume your regular activity, but move at a slower pace for the next 24 hours.   Take frequent rest periods for the next 24 hours.   Walking will help get rid of the air and reduce the bloated feeling in your belly (abdomen).   No driving for 24 hours (because of the medicine (anesthesia) used during the test).    Do not sign any important legal documents or operate any machinery for 24 hours (because of the anesthesia used during the test).  NUTRITION  Drink plenty of fluids.   You may resume your normal diet as instructed by your doctor.   Begin with a light meal and progress to your normal diet. Heavy or fried foods are harder to digest and may make you feel sick to your stomach (nauseated).   Avoid alcoholic beverages for 24 hours or as instructed.  MEDICATIONS  You may resume your normal medications unless your doctor tells you otherwise.  WHAT YOU CAN EXPECT TODAY  Some feelings of bloating in the abdomen.   Passage of more gas than usual.   Spotting of blood in your stool or on the toilet paper.  IF YOU HAD POLYPS REMOVED DURING THE COLONOSCOPY:  No aspirin products for 7 days or as instructed.   No alcohol for 7 days or as instructed.   Eat a soft diet for the next 24 hours.  FINDING OUT THE RESULTS OF YOUR TEST Not all test results are available during your visit. If your test results are not back during the visit, make an appointment  with your caregiver to find out the results. Do not assume everything is normal if you have not heard from your caregiver or the medical facility. It is important for you to follow up on all of your test results.  SEEK IMMEDIATE MEDICAL ATTENTION IF:  You have more than a spotting of blood in your stool.   Your belly is swollen (abdominal distention).   You are nauseated or vomiting.   You have a temperature over 101.   You have abdominal pain or discomfort that is severe or gets worse throughout the day.   Constipation information provided  Continue Amitiza 24 g twice daily with meals  Increase MiraLAX to 17 g orally twice daily  Office visit with [place] in one month  Repeat colonoscopy in 5 years.  Constipation Constipation is when a person:  Poops (has a bowel movement) less than 3 times a week.  Has a hard time pooping.  Has poop that is dry, hard, or bigger than normal. HOME CARE   Eat foods with a lot of fiber in them. This includes fruits, vegetables, beans, and whole grains such as brown rice.  Avoid fatty foods and foods with a lot of sugar. This includes french fries, hamburgers, cookies, candy, and soda.  If you are not getting enough fiber from food, take products with added fiber in them (supplements).  Drink enough fluid to keep your pee (urine) clear or pale yellow.  Exercise on a  regular basis, or as told by your doctor.  Go to the restroom when you feel like you need to poop. Do not hold it.  Only take medicine as told by your doctor. Do not take medicines that help you poop (laxatives) without talking to your doctor first. GET HELP RIGHT AWAY IF:   You have bright red blood in your poop (stool).  Your constipation lasts more than 4 days or gets worse.  You have belly (abdominal) or butt (rectal) pain.  You have thin poop (as thin as a pencil).  You lose weight, and it cannot be explained. MAKE SURE YOU:   Understand these instructions.  Will  watch your condition.  Will get help right away if you are not doing well or get worse. Document Released: 02/02/2008 Document Revised: 08/21/2013 Document Reviewed: 05/28/2013 [facility] Patient Information 2015 [place], [place]. This information is not intended to replace advice given to you by your health care provider. Make sure you discuss any questions you have with your health care provider.

## 2015-01-15 NOTE — Op Note (Signed)
[facility] [address] [place], [address]   COLONOSCOPY PROCEDURE REPORT  PATIENT: Linda English, Linda English  MR#: [id number] BIRTHDATE: 23-Oct-1955 , 87  yrs. old GENDER: female ENDOSCOPIST: R.  Garfield Cornea, MD FACP [facility] REFERRED BY:Roy Willey Blade, M.D. PROCEDURE DATE:  01-19-2015 PROCEDURE:   Colonoscopy, diagnostic INDICATIONS:History of rectal cancer; change in bowel habits (constipation). MEDICATIONS: Versed 5 mg IV and Demerol 125 mg IV in divided doses. Phenergan 25 mg IV.  Zofran 4 mg IV. ASA CLASS:       Class II  CONSENT: The risks, benefits, alternatives and imponderables including but not limited to bleeding, perforation as well as the possibility of a missed lesion have been reviewed.  The potential for biopsy, lesion removal, etc. have also been discussed. Questions have been answered.  All parties agreeable.  Please see the history and physical in the medical record for more information.  DESCRIPTION OF PROCEDURE:   After the risks benefits and alternatives of the procedure were thoroughly explained, informed consent was obtained.  The digital rectal exam revealed no abnormalities of the rectum.   The EC-[id number] ([id number])  endoscope was introduced through the anus and advanced to the cecum, which was identified by both the appendix and ileocecal valve. No adverse events experienced.   The quality of the prep was adequate  The instrument was then slowly withdrawn as the colon was fully examined.      COLON FINDINGS: Normal residual rectum; surgical anastomosis at approximate 5 cm from anal verge.  Normal-appearing residual colonic mucosa.  Retroflexion was performed. .  Withdrawal time=6 minutes 0 seconds.  The scope was withdrawn and the procedure completed. COMPLICATIONS: There were no immediate complications.  ENDOSCOPIC IMPRESSION: Normal of residual rectum and colon.  RECOMMENDATIONS: Continue Amitiza 24 g TWICE daily. Increase  MiraLAX to 17 g orally twice daily. Office visit with [place] in one month.  Repeat colonoscopy for surveillance purposes in 5 years.  eSigned:  R. Garfield Cornea, MD Rosalita Chessman [facility] 01/19/15 2:50 PM   cc:  CPT CODES: ICD CODES:  The ICD and CPT codes recommended by this software are interpretations from the data that the clinical staff has captured with the software.  The verification of the translation of this report to the ICD and CPT codes and modifiers is the sole responsibility of the health care institution and practicing physician where this report was generated.  [facility]. will not be held responsible for the validity of the ICD and CPT codes included on this report.  AMA assumes no liability for data contained or not contained herein. CPT is a Designer, television/film set of the [employer].

## 2015-01-16 ENCOUNTER — Encounter (HOSPITAL_COMMUNITY): Payer: Self-pay | Admitting: Internal Medicine

## 2015-01-21 ENCOUNTER — Telehealth: Payer: Self-pay

## 2015-01-21 MED ORDER — RANITIDINE HCL 150 MG PO CAPS: 150.0000 mg | ORAL_CAPSULE | Freq: Two times a day (BID) | ORAL | Status: DC

## 2015-01-21 NOTE — Telephone Encounter (Signed)
Late entry- pt had called on Friday after the office was closed and left a voicemail. She said that she was having terrible nausea and almost vomiting. I called pt back yesterday evening, explained to the pt that when she called on Friday that the office was closed. She said she was doing better now.   Pt was c/o some problems with reflux. She is currently taking zantac, she said she was unable to tolerate the ppi's and does well on zantac, but she has run out and hasnt bought any more recently. She was wondering if she would be able to get her insurance to pay for it. Can we send in an rx for zantac to [facility]?

## 2015-01-21 NOTE — Telephone Encounter (Signed)
RX done. 

## 2015-01-28 ENCOUNTER — Telehealth: Payer: Self-pay

## 2015-01-28 NOTE — Telephone Encounter (Signed)
Pt called today- she was doing well until Sunday, she started having watery diarrhea on Sunday. On Monday she had nausea all day and vomiting all night. She tried to take her miralax last night and ended up vomiting after taking it. Pt said she feels good today, no N/V, no diarrhea. Pt is concerned because she seems to have these episodes from time to time.   I advised pt to not take her miralax on any day that she has diarrhea or vomiting. She should also stick to clear liquids for 24 hours after it stops. Pt said she didn't know that and she had been trying to eat during all of this, but kept vomiting it up. I asked her if she thought it may be a "gi bug" and she said she wasn't sure if it was or not, but she is feeling ok at the moment but wanted to let us know what happened over the weekend.

## 2015-01-30 NOTE — Telephone Encounter (Signed)
Information noted per Almyra Free. Agree with recommendations provided. Patient may have had GI bug.   Keep OV as scheduled for later this month and we can discuss more then. Call sooner if needed.

## 2015-02-03 ENCOUNTER — Telehealth: Payer: Self-pay

## 2015-02-03 NOTE — Telephone Encounter (Signed)
Pt is calling because the miralax is making her sick.She has stopped taken it since. She just wanted to let us know.

## 2015-02-04 NOTE — Telephone Encounter (Signed)
Noted. Keep OV as planned. Call sooner if needed.

## 2015-02-04 NOTE — Telephone Encounter (Signed)
Pt is aware.  

## 2015-02-13 ENCOUNTER — Telehealth: Payer: Self-pay

## 2015-02-13 MED ORDER — LACTULOSE 10 GM/15ML PO SOLN: 10.0000 g | Freq: Two times a day (BID) | ORAL | Status: DC

## 2015-02-13 NOTE — Telephone Encounter (Signed)
Pt called and states that she has had to stop taking the miralax because it was making her sick and she also had to stop taking the Amitiza  (02/12/2015). She states she still isn't going to the restroom and needs to know what to do. Her last BM was on Monday. States that the BM was long and thin. Denies throwing up and feels like she needs to vomit.  Wants to know what she needs to do.  Please advise.

## 2015-02-13 NOTE — Telephone Encounter (Signed)
Keep OV on 02/17/15 as planned.  Can take 1/2 to 1 bottle of MacCitrate today OR Dulcolax 5mg  tablet (3 today).  Then start Lactulose 15cc po BID. RX done.

## 2015-02-13 NOTE — Telephone Encounter (Signed)
Noted and pt is aware.

## 2015-02-17 ENCOUNTER — Ambulatory Visit (INDEPENDENT_AMBULATORY_CARE_PROVIDER_SITE_OTHER): Payer: Medicare Other | Admitting: Gastroenterology

## 2015-02-17 ENCOUNTER — Telehealth: Payer: Self-pay | Admitting: *Deleted

## 2015-02-17 ENCOUNTER — Encounter: Payer: Self-pay | Admitting: Gastroenterology

## 2015-02-17 VITALS — BP 127/74 | HR 84 | Temp 97.1°F | Ht 62.0 in | Wt 156.8 lb

## 2015-02-17 DIAGNOSIS — K589 Irritable bowel syndrome without diarrhea: Secondary | ICD-10-CM

## 2015-02-17 DIAGNOSIS — K219 Gastro-esophageal reflux disease without esophagitis: Secondary | ICD-10-CM

## 2015-02-17 MED ORDER — PANTOPRAZOLE SODIUM 40 MG PO TBEC: 40.0000 mg | DELAYED_RELEASE_TABLET | Freq: Every day | ORAL | Status: DC

## 2015-02-17 NOTE — Telephone Encounter (Signed)
Pt called [facility] Friday afternoon stating she drinked 1 bottle of stuff and she got sick and she took her medication but she can not drink the second bottle of stuff. Please advise

## 2015-02-17 NOTE — Telephone Encounter (Signed)
Patient called back and stated she is on her way to her 10am ov

## 2015-02-17 NOTE — Assessment & Plan Note (Signed)
Constipation with failure to multiple medication due to efficacy or side effects. Recently took lactulose at inappropriate dosage. She request trial of lactulose as prescribed. With regards to nausea/vomiting, ?secondary to medications (since stopped all suspected meds). Add pantoprazole daily for possible gastritis or atypical GERD. Call with ongoing symptoms.

## 2015-02-17 NOTE — Patient Instructions (Signed)
1. Continue lactulose, 1-2 tablespoons twice daily for constipation. 2. Try pantoprazole 1 tablet 30 minutes before breakfast each day to see if this helps your nausea, vomiting, abdominal discomfort with meals. 3. Call and let us know how you are doing in 2 weeks or sooner if needed.

## 2015-02-17 NOTE — Telephone Encounter (Signed)
I tried to call pt no answer Linda English.

## 2015-02-17 NOTE — Progress Notes (Signed)
cc'd to pcp 

## 2015-02-17 NOTE — Telephone Encounter (Signed)
Pt has ov this morning at [facility], please make sure she is coming to this visit.

## 2015-02-17 NOTE — Progress Notes (Signed)
Primary Care Physician: [name], MD  Primary Gastroenterologist:  Garfield Cornea, MD   Chief Complaint  Patient presents with  . Follow-up    HPI: Linda English is a 59 y.o. female here for follow-up. History of IBS, rectal cancer status post resection in 2003. Lots of issues with incomplete bowel movements, constipation over the past several months. Linzess caused stomach pain. Amitiza 8 and 4mcg BID tried but developed significant nausea. Cannot tolerate Miralax due to vomiting. Colonoscopy on 01/15/2015 showed normal residual rectum and colon. Surgical anastomosis at approximately 5 cm from the anal verge. Repeat surveillance colonoscopy in 5 years.  Recently called in and given RX for lactulose. She took entire bottle at one sitting instead of 15cc. within 30 minutes she developed profuse vomiting. Has felt better since then.   Has to take something to go have BM or will goes days without BM. Then when takes something it makes her sick. Heartburn couple of times per week. Regurgitation and burns throat.   Took nexium forever but was not tolerating (felt sick at night/gassy) so stopped nexium. Then symptoms went away. Tried prilosec but didn't tolerate or didn't work. She can't remember. Tried zantac.   Current Outpatient Prescriptions  Medication Sig Dispense Refill  . amLODipine-benazepril (LOTREL) 5-20 MG per capsule Take 2 capsules by mouth daily.    Marland Kitchen amphetamine-dextroamphetamine (ADDERALL) 20 MG tablet Take 20 mg by mouth 2 (two) times daily.  0  . aspirin EC 81 MG tablet Take 81 mg by mouth daily.    Marland Kitchen atorvastatin (LIPITOR) 10 MG tablet Take 10 mg by mouth at bedtime.    . carbamazepine (TEGRETOL XR) 200 MG 12 hr tablet Take 400 mg by mouth at bedtime.     . clonazePAM (KLONOPIN) 1 MG tablet Take 1 mg by mouth 4 (four) times daily as needed for anxiety.    Marland Kitchen estradiol (ESTRACE) 0.5 MG tablet Take 0.5 mg by mouth daily.     Marland Kitchen lactulose (CHRONULAC) 10 GM/15ML  solution Take 15 mLs (10 g total) by mouth 2 (two) times daily. 946 mL 3  . lamoTRIgine (LAMICTAL) 200 MG tablet Take 200 mg by mouth 2 (two) times daily.     . Lurasidone HCl 120 MG TABS Take 60 mg by mouth daily after supper.     . metFORMIN (GLUCOPHAGE-XR) 500 MG 24 hr tablet Take 500 mg by mouth 2 (two) times daily.    Marland Kitchen oxybutynin (DITROPAN-XL) 10 MG 24 hr tablet Take 10 mg by mouth daily.      No current facility-administered medications for this visit.    Allergies as of 02/17/2015 - Review Complete 02/17/2015  Allergen Reaction Noted  . Excedrin extra strength [aspirin-acetaminophen-caffeine] Other (See Comments) 10/28/2014   Past Medical History  Diagnosis Date  . Anxiety   . Allergic rhinitis   . Acid reflux   . IBS (irritable bowel syndrome)   . Depression   . Panic disorder   . Chronic headache   . DM (diabetes mellitus)   . Hypertension   . Obstructive sleep apnea   . Lichen planus   . Arthritis     osteoarthritis  . Hiatal hernia   . Gastric erosions   . Diverticula of colon   . Hemorrhoids   . Tubular adenoma   . Gastric polyps     benign   . Bipolar disorder   . Rectal cancer 2003    ileostomy and reversal   Past Surgical  History  Procedure Laterality Date  . Spine surgery  2001    L5,S1 HEMILAMINOTOMY AND DISCECTOMY/DR DEATON  . Cholecystectomy    . Low anterior rection  2003    RECTAL CANCER  . Cesarean section      X  2  . Appendectomy    . Colonoscopy  08/2007    DR Gala Romney, friable anal canal hemorrhoids, surgical anastomosis at 3cm, distal scattered tics  . Esophagogastroduodenoscopy  05/2002    DR Gala Romney, normal  . Colonoscopy  12/15/2010    anal papilla and internal hemorrhoids/diminutive polyp in the base of the cecum. Past, tubular adenoma.. Next colonoscopy due in April 2017.  . Abdominal hysterectomy    . Esophagogastroduodenoscopy  08/03/2011    RMR: small HH/ gastric polyps  . Back surgery    . Colonoscopy  10/27/2005    RMR: Anal  canal hemorrhoids. Surgical anastomosis at 3 cm from the anal verge appeared normal. Few scattered distal diverticula. The residual  colonic mucosa appeared normal. I suspect the patient bled from hemorrhoids  . Colonoscopy N/A 01/15/2015    MYT:RZNBVA residual rectum and colon. next tcs 12/2019     ROS:  General: Negative for anorexia, weight loss, fever, chills, fatigue, weakness. ENT: Negative for hoarseness, difficulty swallowing , nasal congestion. CV: Negative for chest pain, angina, palpitations, dyspnea on exertion, peripheral edema.  Respiratory: Negative for dyspnea at rest, dyspnea on exertion, cough, sputum, wheezing.  GI: See history of present illness. GU:  Negative for dysuria, hematuria, urinary incontinence, urinary frequency, nocturnal urination.  Endo: Negative for unusual weight change.    Physical Examination:   BP 127/74 mmHg  Pulse 84  Temp(Src) 97.1 F (36.2 C)  Ht 5\' 2"  (1.575 m)  Wt 156 lb 12.8 oz (71.124 kg)  BMI 28.67 kg/m2  General: Well-nourished, well-developed in no acute distress.  Eyes: No icterus. Mouth: Oropharyngeal mucosa moist and pink , no lesions erythema or exudate. Lungs: Clear to auscultation bilaterally.  Heart: Regular rate and rhythm, no murmurs rubs or gallops.  Abdomen: Bowel sounds are normal, nontender, nondistended, no hepatosplenomegaly or masses, no abdominal bruits or hernia , no rebound or guarding.   Extremities: No lower extremity edema. No clubbing or deformities. Neuro: Alert and oriented x 4   Skin: Warm and dry, no jaundice.   Psych: Alert and cooperative, normal mood and affect.

## 2015-03-06 ENCOUNTER — Telehealth: Payer: Self-pay | Admitting: Internal Medicine

## 2015-03-06 NOTE — Telephone Encounter (Signed)
Spoke with the pt- she said she has been taking the lactulose 7ml as prescribed. She is only having a bm every 7 days and she has to push and strain to have one then. She did drink a can of prune juice about 2 weeks ago and had multiple rounds of diarrhea for a day and then went back to constipation. She took a dulcolax yesterday and has not had a bm yet.  She has some nausea at times, she has some pain in her R side but not all the time. No blood in her stool. She has tried linzess and amitiza in the past and linzess made her stomach hurt and the [place] didn't work. She is concerned and wants to know what she can do to help her have a bm. Routing to RMR in LSL absence.

## 2015-03-06 NOTE — Telephone Encounter (Signed)
Pt came to front office to say that she was told to call us in 2 weeks to give a report. I told patient to have a seat and someone would be out to speak with her shortly.

## 2015-03-07 MED ORDER — PEG 3350-KCL-NA BICARB-NACL 420 G PO SOLR: 4000.0000 mL | Freq: Once | ORAL | Status: DC

## 2015-03-07 NOTE — Telephone Encounter (Signed)
Consider giving her colon purge with same prep that she took for colonoscopy. Then would add MiraLAX 17 g once to twice daily to her regimen.  Follow-up office visit as needed.

## 2015-03-07 NOTE — Telephone Encounter (Signed)
Pt is aware. Prep sent to the pharmacy.  Pt stated she has taken miralax in the past and it caused nausea and vomiting. She can take dulcolax with no problems.

## 2015-03-07 NOTE — Addendum Note (Signed)
Addended by: Claudina Lick on: 03/07/2015 11:53 AM   Modules accepted: Orders

## 2015-03-09 ENCOUNTER — Telehealth: Payer: Self-pay | Admitting: Internal Medicine

## 2015-03-09 NOTE — Telephone Encounter (Signed)
Pt called yesterday.  PEG purge resulted in multiple BMs - still had some to go but now nauseated.  I told her PEG had served its purpose; recommended stopping it; begin Miralax 17 grams once daily along w colace 100mg  twice daily 7/10 and see how it goes.

## 2015-03-10 NOTE — Telephone Encounter (Signed)
noted 

## 2015-03-18 ENCOUNTER — Telehealth: Payer: Self-pay

## 2015-03-18 NOTE — Telephone Encounter (Signed)
Pt is aware. She will call if she continues to have problems and will  schedule an ov.

## 2015-03-18 NOTE — Telephone Encounter (Signed)
Okay; stop the MiraLax.; try Ducolax tabllets -follow label  instructions. Patient needs an office visit with [place] regarding constipation

## 2015-03-18 NOTE — Telephone Encounter (Signed)
Pt called- she is taking miralax once a day and dulcolax once a day. She is only having a bm about once every 3-4 days. Miralax is causing nausea and she doesn't like taking it. No blood seen in her stool. She wants to know if there is anything else she can do?

## 2015-04-04 DIAGNOSIS — Z79899 Other long term (current) drug therapy: Secondary | ICD-10-CM | POA: Diagnosis not present

## 2015-04-04 DIAGNOSIS — E119 Type 2 diabetes mellitus without complications: Secondary | ICD-10-CM | POA: Diagnosis not present

## 2015-04-04 DIAGNOSIS — E039 Hypothyroidism, unspecified: Secondary | ICD-10-CM | POA: Diagnosis not present

## 2015-04-10 DIAGNOSIS — Z6830 Body mass index (BMI) 30.0-30.9, adult: Secondary | ICD-10-CM | POA: Diagnosis not present

## 2015-04-10 DIAGNOSIS — E119 Type 2 diabetes mellitus without complications: Secondary | ICD-10-CM | POA: Diagnosis not present

## 2015-04-10 DIAGNOSIS — E030 Congenital hypothyroidism with diffuse goiter: Secondary | ICD-10-CM | POA: Diagnosis not present

## 2015-04-10 DIAGNOSIS — I10 Essential (primary) hypertension: Secondary | ICD-10-CM | POA: Diagnosis not present

## 2015-04-24 ENCOUNTER — Encounter: Payer: Self-pay | Admitting: Internal Medicine

## 2015-04-24 ENCOUNTER — Telehealth: Payer: Self-pay

## 2015-04-24 NOTE — Telephone Encounter (Signed)
Pt is calling because she is still having the same problems. She would like to know what she can do. Please advise  Her call back [phone].

## 2015-04-24 NOTE — Telephone Encounter (Signed)
Per RMR's last recommendation, she needs another OV. Last seen 01/2015.

## 2015-04-24 NOTE — Telephone Encounter (Signed)
Can we make her an appointment

## 2015-04-24 NOTE — Telephone Encounter (Signed)
APPT MADE AND LETTER SENT  °

## 2015-05-02 ENCOUNTER — Telehealth: Payer: Self-pay | Admitting: Internal Medicine

## 2015-05-02 NOTE — Telephone Encounter (Signed)
She is intolerant to most laxative regimens we have offered. She should not have waited 9 days before calling. Best bet is to titrate MiraLax until she achieves a bowel movement i.e. MiraLax 17 g orally in 8 ounces of water every hour x5 doses until BM; if no bowel movement after that, she should go to the emergency room

## 2015-05-02 NOTE — Telephone Encounter (Signed)
Pt called to verify her OV date and time and is aware. She said she hasn't had a BM in 9 days and didn't know what to do. Please advise and call her at [phone]

## 2015-05-06 NOTE — Telephone Encounter (Signed)
Tried to call pt- NA- LMOM with recommendations.  

## 2015-05-14 ENCOUNTER — Ambulatory Visit (INDEPENDENT_AMBULATORY_CARE_PROVIDER_SITE_OTHER): Payer: Medicare Other | Admitting: Gastroenterology

## 2015-05-14 ENCOUNTER — Encounter: Payer: Self-pay | Admitting: Gastroenterology

## 2015-05-14 VITALS — BP 136/75 | HR 116 | Temp 98.0°F | Ht 62.0 in | Wt 159.2 lb

## 2015-05-14 DIAGNOSIS — K5909 Other constipation: Secondary | ICD-10-CM

## 2015-05-14 NOTE — Progress Notes (Signed)
Referring Provider: [name], MD Primary Care Physician:  [name], MD  Primary GI: Dr. Gala Romney   Chief Complaint  Patient presents with  . Constipation    HPI:   Linda English is a 59 y.o. female presenting today with a history of constipation,  rectal cancer status post resection in 2003. Linzess caused abdominal pain, intolerant to Amitiza due to nausea. Colonoscopy on 01/15/2015 showed normal residual rectum and colon. Surgical anastomosis at approximately 5 cm from the anal verge. Repeat surveillance colonoscopy in 5 years. She has called in multiple times with constipation, incomplete evacuation, obstipation.   Severe issues with constipation. Has gone weeks at a time without a BM. Miralax BID and dulcolax tablets prn. Will try and go but it is incomplete. Has to push hard for stool to come out. Feels like stool is right there but can't push it out. Feels something there but unable to produce productive BM.   Past Medical History  Diagnosis Date  . Anxiety   . Allergic rhinitis   . Acid reflux   . IBS (irritable bowel syndrome)   . Depression   . Panic disorder   . Chronic headache   . DM (diabetes mellitus)   . Hypertension   . Obstructive sleep apnea   . Lichen planus   . Arthritis     osteoarthritis  . Hiatal hernia   . Gastric erosions   . Diverticula of colon   . Hemorrhoids   . Tubular adenoma   . Gastric polyps     benign   . Bipolar disorder   . Rectal cancer 2003    ileostomy and reversal    Past Surgical History  Procedure Laterality Date  . Spine surgery  2001    L5,S1 HEMILAMINOTOMY AND DISCECTOMY/DR DEATON  . Cholecystectomy    . Low anterior rection  2003    RECTAL CANCER  . Cesarean section      X  2  . Appendectomy    . Colonoscopy  08/2007    DR Gala Romney, friable anal canal hemorrhoids, surgical anastomosis at 3cm, distal scattered tics  . Esophagogastroduodenoscopy  05/2002    DR Gala Romney, normal  . Colonoscopy  12/15/2010      anal papilla and internal hemorrhoids/diminutive polyp in the base of the cecum. Past, tubular adenoma.. Next colonoscopy due in April 2017.  . Abdominal hysterectomy    . Esophagogastroduodenoscopy  08/03/2011    RMR: small HH/ gastric polyps  . Back surgery    . Colonoscopy  10/27/2005    RMR: Anal canal hemorrhoids. Surgical anastomosis at 3 cm from the anal verge appeared normal. Few scattered distal diverticula. The residual  colonic mucosa appeared normal. I suspect the patient bled from hemorrhoids  . Colonoscopy N/A 01/15/2015    [id number] residual rectum and colon. next tcs 12/2019    Current Outpatient Prescriptions  Medication Sig Dispense Refill  . amLODipine-benazepril (LOTREL) 5-20 MG per capsule Take 2 capsules by mouth daily.    Marland Kitchen amphetamine-dextroamphetamine (ADDERALL) 20 MG tablet Take 20 mg by mouth 2 (two) times daily.  0  . aspirin EC 81 MG tablet Take 81 mg by mouth daily.    Marland Kitchen atorvastatin (LIPITOR) 10 MG tablet Take 10 mg by mouth at bedtime.    . Bisacodyl (DULCOLAX PO) Take by mouth as needed.    . carbamazepine (TEGRETOL XR) 200 MG 12 hr tablet Take 400 mg by mouth at bedtime.     Marland Kitchen  clonazePAM (KLONOPIN) 1 MG tablet Take 1 mg by mouth 4 (four) times daily as needed for anxiety.    Marland Kitchen estradiol (ESTRACE) 0.5 MG tablet Take 0.5 mg by mouth daily.     Marland Kitchen lamoTRIgine (LAMICTAL) 200 MG tablet Take 200 mg by mouth 2 (two) times daily.     . Lurasidone HCl 120 MG TABS Take 60 mg by mouth daily after supper.     . metFORMIN (GLUCOPHAGE-XR) 500 MG 24 hr tablet Take 500 mg by mouth 2 (two) times daily.    Marland Kitchen oxybutynin (DITROPAN-XL) 10 MG 24 hr tablet Take 10 mg by mouth daily.     . pantoprazole (PROTONIX) 40 MG tablet Take 1 tablet (40 mg total) by mouth daily before breakfast. 30 tablet 5  . polyethylene glycol (MIRALAX / GLYCOLAX) packet Take 17 g by mouth 2 (two) times daily.    Marland Kitchen lactulose (CHRONULAC) 10 GM/15ML solution Take 15 mLs (10 g total) by mouth 2 (two)  times daily. (Patient not taking: Reported on 05/14/2015) 946 mL 3  . polyethylene glycol-electrolytes (NULYTELY/GOLYTELY) 420 G solution Take 4,000 mLs by mouth once. (Patient not taking: Reported on 05/14/2015) 4000 mL 0  . ranitidine (ZANTAC) 150 MG tablet TK 1 T PO BID  8   No current facility-administered medications for this visit.    Allergies as of 05/14/2015 - Review Complete 05/14/2015  Allergen Reaction Noted  . Excedrin extra strength [aspirin-acetaminophen-caffeine] Other (See Comments) 10/28/2014    Family History  Problem Relation Age of Onset  . Colon cancer Paternal Grandfather 48  . Colon polyps Brother   . Colon polyps Cousin     Social History   Social History  . Marital Status: Divorced    Spouse Name: N/A  . Number of Children: N/A  . Years of Education: N/A   Occupational History  . teacher     [employer]   Social History Main Topics  . Smoking status: Never Smoker   . Smokeless tobacco: Never Used     Comment: Never smoked  . Alcohol Use: No  . Drug Use: No  . Sexual Activity: No   Other Topics Concern  . None   Social History Narrative    Review of Systems: Negative unless mentioned in HPI  Physical Exam: BP 136/75 mmHg  Pulse 116  Temp(Src) 98 F (36.7 C)  Ht 5\' 2"  (1.575 m)  Wt 159 lb 3.2 oz (72.213 kg)  BMI 29.11 kg/m2 General:   Alert and oriented. No distress noted. Pleasant and cooperative.  Head:  Normocephalic and atraumatic. Eyes:  Conjuctiva clear without scleral icterus. Mouth:  Oral mucosa pink and moist. Good dentition. No lesions. Abdomen:  +BS, soft, non-tender and non-distended. No rebound or guarding. No HSM or masses noted. Msk:  Symmetrical without gross deformities. Normal posture. Extremities:  Without edema. Neurologic:  Alert and  oriented x4;  grossly normal neurologically. Psych:  Alert and cooperative. Normal mood and affect.

## 2015-05-14 NOTE — Patient Instructions (Signed)
Continue Miralax twice a day.  Take 1 dulcolax tablet daily.  Every Monday, Wednesday, and Friday take a dulcolax suppository.  We will see you in 4 weeks.

## 2015-05-16 NOTE — Assessment & Plan Note (Signed)
59 year old female with persisting constipation, history of rectal cancer s/p resection in 2003. Colonoscopy up-to-date as of 2016. Symptoms appear to correlate with pelvic floor dysfunction. Declining tertiary center evaluation. Failed prescriptive agents (Linzess with abdominal pain and Amitiza with severe nausea). Continue Miralax BID, add Dulcolax tablets every day, and use dulcolax suppository every Mon, Wed, Fri. Return in 4 weeks.

## 2015-05-19 NOTE — Progress Notes (Signed)
cc'ed to pcp °

## 2015-06-02 ENCOUNTER — Other Ambulatory Visit (HOSPITAL_COMMUNITY): Payer: Self-pay | Admitting: Respiratory Therapy

## 2015-06-02 DIAGNOSIS — E0821 Diabetes mellitus due to underlying condition with diabetic nephropathy: Secondary | ICD-10-CM | POA: Diagnosis not present

## 2015-06-02 DIAGNOSIS — G4700 Insomnia, unspecified: Secondary | ICD-10-CM | POA: Diagnosis not present

## 2015-06-02 DIAGNOSIS — I10 Essential (primary) hypertension: Secondary | ICD-10-CM | POA: Diagnosis not present

## 2015-06-02 DIAGNOSIS — G4733 Obstructive sleep apnea (adult) (pediatric): Secondary | ICD-10-CM

## 2015-06-13 ENCOUNTER — Other Ambulatory Visit: Payer: Self-pay

## 2015-06-13 ENCOUNTER — Encounter: Payer: Self-pay | Admitting: Gastroenterology

## 2015-06-13 ENCOUNTER — Ambulatory Visit (INDEPENDENT_AMBULATORY_CARE_PROVIDER_SITE_OTHER): Payer: Medicare Other | Admitting: Gastroenterology

## 2015-06-13 VITALS — BP 144/86 | HR 86 | Temp 98.1°F | Ht 62.0 in | Wt 158.6 lb

## 2015-06-13 DIAGNOSIS — R1013 Epigastric pain: Secondary | ICD-10-CM | POA: Insufficient documentation

## 2015-06-13 DIAGNOSIS — R1310 Dysphagia, unspecified: Secondary | ICD-10-CM | POA: Diagnosis not present

## 2015-06-13 DIAGNOSIS — K5909 Other constipation: Secondary | ICD-10-CM | POA: Diagnosis not present

## 2015-06-13 MED ORDER — ONDANSETRON HCL 4 MG PO TABS: 4.0000 mg | ORAL_TABLET | Freq: Three times a day (TID) | ORAL | Status: DC | PRN

## 2015-06-13 NOTE — Assessment & Plan Note (Addendum)
Continue current regimen of Miralax BID, Dulcolax tablet daily, suppository M, W, F. Unfortunately, she was unable to tolerate Linzess and Amitiza. This appears to be working well for her currently. Colonoscopy up-to-date with surveillance due in May 2021.

## 2015-06-13 NOTE — Assessment & Plan Note (Signed)
Dilation as appropriate.  

## 2015-06-13 NOTE — Patient Instructions (Signed)
We have scheduled you for an upper endoscopy and possible dilation with Dr. Gala Romney.  Continue the constipation management as we have discussed.  I sent Zofran for nausea to your pharmacy to take as needed.

## 2015-06-13 NOTE — Progress Notes (Signed)
Referring Provider: [name], MD Primary Care Physician:  [name], MD  Primary GI: Dr. Gala Romney   Chief Complaint  Patient presents with  . Follow-up    doing better    HPI:   Linda English is a 59 y.o. female presenting today with a history of constipation, rectal cancer status post resection in 2003. Colonoscopy on 01/15/2015 showed normal residual rectum and colon. Surgical anastomosis at approximately 5 cm from the anal verge. Repeat surveillance colonoscopy in 5 years. Failed Linzess, stating it caused "abdominal pain", and she was intolerant to Amitiza due to nausea. Concern for pelvic floor dysfunction. At last visit, prescribed Miralax BID, dulcolax tablets every day and suppository every Mon, Wed, Fri. Here for follow-up.   Today she notes she has reflux, feels "unsettled" upper abdomen despite Protonix once a day. Sometimes feels really sick before going to bed. Gallbladder absent. Feels like food doesn't "know which way to go". Associated nausea. Sometimes vague esophageal dysphagia with thicker foods. Pill dysphagia. Has tried/failed Prilosec, Nexium. States Nexium gave her a lot of gas. GES normal in 2013. Regurgitates. Every "blue purple moon" will take ibuprofen.    No diarrhea. Has to push stool out. Will go about 6 times a day sometimes, but it is soft.   Past Medical History  Diagnosis Date  . Anxiety   . Allergic rhinitis   . Acid reflux   . IBS (irritable bowel syndrome)   . Depression   . Panic disorder   . Chronic headache   . DM (diabetes mellitus) ([place])   . Hypertension   . Obstructive sleep apnea   . Lichen planus   . Arthritis     osteoarthritis  . Hiatal hernia   . Gastric erosions   . Diverticula of colon   . Hemorrhoids   . Tubular adenoma   . Gastric polyps     benign   . Bipolar disorder ([place])   . Rectal cancer ([place]) 2003    ileostomy and reversal    Past Surgical History  Procedure Laterality Date  . Spine surgery  2001    L5,S1 HEMILAMINOTOMY AND DISCECTOMY/DR DEATON  . Cholecystectomy    . Low anterior rection  2003    RECTAL CANCER  . Cesarean section      X  2  . Appendectomy    . Colonoscopy  08/2007    DR Gala Romney, friable anal canal hemorrhoids, surgical anastomosis at 3cm, distal scattered tics  . Esophagogastroduodenoscopy  05/2002    DR Gala Romney, normal  . Colonoscopy  12/15/2010    anal papilla and internal hemorrhoids/diminutive polyp in the base of the cecum. Past, tubular adenoma.. Next colonoscopy due in April 2017.  . Abdominal hysterectomy    . Esophagogastroduodenoscopy  08/03/2011    RMR: small HH/ gastric polyps  . Back surgery    . Colonoscopy  10/27/2005    RMR: Anal canal hemorrhoids. Surgical anastomosis at 3 cm from the anal verge appeared normal. Few scattered distal diverticula. The residual  colonic mucosa appeared normal. I suspect the patient bled from hemorrhoids  . Colonoscopy N/A 01/15/2015    [id number] residual rectum and colon. next tcs 12/2019    Current Outpatient Prescriptions  Medication Sig Dispense Refill  . amLODipine-benazepril (LOTREL) 5-20 MG per capsule Take 2 capsules by mouth daily.    Marland Kitchen amphetamine-dextroamphetamine (ADDERALL) 20 MG tablet Take 20 mg by mouth 2 (two) times daily.  0  . aspirin EC 81 MG tablet Take  81 mg by mouth daily.    Marland Kitchen atorvastatin (LIPITOR) 10 MG tablet Take 10 mg by mouth at bedtime.    . Bisacodyl (DULCOLAX PO) Take by mouth as needed.    . carbamazepine (TEGRETOL XR) 200 MG 12 hr tablet Take 600 mg by mouth at bedtime.     . clonazePAM (KLONOPIN) 1 MG tablet Take 1 mg by mouth 4 (four) times daily as needed for anxiety.    . lamoTRIgine (LAMICTAL) 200 MG tablet Take 200 mg by mouth 2 (two) times daily.     . Lurasidone HCl 120 MG TABS Take 60 mg by mouth daily after supper.     . metFORMIN (GLUCOPHAGE-XR) 500 MG 24 hr tablet Take 500 mg by mouth 2 (two) times daily.    Marland Kitchen OVER THE COUNTER MEDICATION otc Zquill takes prn    . oxybutynin  (DITROPAN-XL) 10 MG 24 hr tablet Take 10 mg by mouth daily.     . pantoprazole (PROTONIX) 40 MG tablet Take 1 tablet (40 mg total) by mouth daily before breakfast. 30 tablet 5  . polyethylene glycol (MIRALAX / GLYCOLAX) packet Take 17 g by mouth 2 (two) times daily.    . ranitidine (ZANTAC) 150 MG tablet TK 1 T PO BID  8  . estradiol (ESTRACE) 0.5 MG tablet Take 0.5 mg by mouth daily.      No current facility-administered medications for this visit.    Allergies as of 06/13/2015 - Review Complete 06/13/2015  Allergen Reaction Noted  . Excedrin extra strength [aspirin-acetaminophen-caffeine] Other (See Comments) 10/28/2014    Family History  Problem Relation Age of Onset  . Colon cancer Paternal Grandfather 57  . Colon polyps Brother   . Colon polyps Cousin     Social History   Social History  . Marital Status: Divorced    Spouse Name: N/A  . Number of Children: N/A  . Years of Education: N/A   Occupational History  . teacher     [employer]   Social History Main Topics  . Smoking status: Never Smoker   . Smokeless tobacco: Never Used     Comment: Never smoked  . Alcohol Use: No  . Drug Use: No  . Sexual Activity: No   Other Topics Concern  . None   Social History Narrative    Review of Systems: As mentioned in HPI  Physical Exam: BP 144/86 mmHg  Pulse 86  Temp(Src) 98.1 F (36.7 C) (Oral)  Ht 5\' 2"  (1.575 m)  Wt 158 lb 9.6 oz (71.94 kg)  BMI 29.00 kg/m2 General:   Alert and oriented. No distress noted. Pleasant and cooperative.  Head:  Normocephalic and atraumatic. Eyes:  Conjuctiva clear without scleral icterus. Mouth:  Oral mucosa pink and moist. Good dentition. No lesions. Heart:  S1, S2 present without murmurs, rubs, or gallops. Regular rate and rhythm. Abdomen:  +BS, soft, non-tender and non-distended. No rebound or guarding. No HSM or masses noted. Msk:  Symmetrical without gross deformities. Normal posture. Extremities:  Without  edema. Neurologic:  Alert and  oriented x4;  grossly normal neurologically. Psych:  Alert and cooperative. Normal mood and affect.

## 2015-06-13 NOTE — Assessment & Plan Note (Signed)
59 year old female with persistent reflux despite Protonix once daily, associated nausea, GES normal in 2013. Rare Ibuprofen. Last EGD in 2012 with gastric polyps, small hiatal hernia. Vague solid food dysphagia likely secondary to uncontrolled GERD, possible web, ring, stricture, doubt occult malignancy. Due to persistent GERD, nausea, vague dyspepsia symptoms and dysphagia, will proceed with EGD/dilation with Dr. Gala Romney in near future. May need to trial Dexilant after review of EGD findings. (Prior failure of Nexium and Prilosec).   Proceed with upper endoscopy/dilation in the near future with Dr. Gala Romney. The risks, benefits, and alternatives have been discussed in detail with patient. They have stated understanding and desire to proceed.  Consider GES in future due to diabetes and concern for delayed stomach emptying Zofran prn nausea Continue Protonix once daily for now

## 2015-06-13 NOTE — Progress Notes (Signed)
cc'ed to pcp °

## 2015-06-27 ENCOUNTER — Encounter (HOSPITAL_COMMUNITY)
Admission: RE | Disposition: A | Discharge status: Home / Self Care | Payer: Self-pay | Source: Ambulatory Visit | Attending: Internal Medicine

## 2015-06-27 ENCOUNTER — Ambulatory Visit (HOSPITAL_COMMUNITY)
Admission: RE | Admit: 2015-06-27 | Discharge: 2015-06-27 | Disposition: A | Discharge status: Home / Self Care | Payer: Medicare Other | Source: Ambulatory Visit | Attending: Internal Medicine | Admitting: Internal Medicine

## 2015-06-27 ENCOUNTER — Encounter (HOSPITAL_COMMUNITY): Payer: Self-pay | Admitting: *Deleted

## 2015-06-27 DIAGNOSIS — Z79899 Other long term (current) drug therapy: Secondary | ICD-10-CM | POA: Diagnosis not present

## 2015-06-27 DIAGNOSIS — R1013 Epigastric pain: Secondary | ICD-10-CM | POA: Diagnosis not present

## 2015-06-27 DIAGNOSIS — G4733 Obstructive sleep apnea (adult) (pediatric): Secondary | ICD-10-CM | POA: Diagnosis not present

## 2015-06-27 DIAGNOSIS — K2100 Gastro-esophageal reflux disease with esophagitis, without bleeding: Secondary | ICD-10-CM | POA: Insufficient documentation

## 2015-06-27 DIAGNOSIS — I10 Essential (primary) hypertension: Secondary | ICD-10-CM | POA: Insufficient documentation

## 2015-06-27 DIAGNOSIS — K317 Polyp of stomach and duodenum: Secondary | ICD-10-CM | POA: Diagnosis not present

## 2015-06-27 DIAGNOSIS — F319 Bipolar disorder, unspecified: Secondary | ICD-10-CM | POA: Insufficient documentation

## 2015-06-27 DIAGNOSIS — E119 Type 2 diabetes mellitus without complications: Secondary | ICD-10-CM | POA: Diagnosis not present

## 2015-06-27 DIAGNOSIS — Z85048 Personal history of other malignant neoplasm of rectum, rectosigmoid junction, and anus: Secondary | ICD-10-CM | POA: Diagnosis not present

## 2015-06-27 DIAGNOSIS — K449 Diaphragmatic hernia without obstruction or gangrene: Secondary | ICD-10-CM | POA: Diagnosis not present

## 2015-06-27 DIAGNOSIS — F418 Other specified anxiety disorders: Secondary | ICD-10-CM | POA: Insufficient documentation

## 2015-06-27 DIAGNOSIS — Z7982 Long term (current) use of aspirin: Secondary | ICD-10-CM | POA: Insufficient documentation

## 2015-06-27 DIAGNOSIS — Z7984 Long term (current) use of oral hypoglycemic drugs: Secondary | ICD-10-CM | POA: Insufficient documentation

## 2015-06-27 DIAGNOSIS — R110 Nausea: Secondary | ICD-10-CM | POA: Insufficient documentation

## 2015-06-27 DIAGNOSIS — R1310 Dysphagia, unspecified: Secondary | ICD-10-CM | POA: Diagnosis not present

## 2015-06-27 DIAGNOSIS — K2210 Ulcer of esophagus without bleeding: Secondary | ICD-10-CM | POA: Diagnosis not present

## 2015-06-27 DIAGNOSIS — R1314 Dysphagia, pharyngoesophageal phase: Secondary | ICD-10-CM | POA: Diagnosis not present

## 2015-06-27 HISTORY — PX: ESOPHAGOGASTRODUODENOSCOPY: SHX5428

## 2015-06-27 HISTORY — PX: ESOPHAGEAL DILATION: SHX303

## 2015-06-27 LAB — GLUCOSE, CAPILLARY: GLUCOSE-CAPILLARY: 91 mg/dL (ref 65–99)

## 2015-06-27 SURGERY — EGD (ESOPHAGOGASTRODUODENOSCOPY)
Anesthesia: Moderate Sedation

## 2015-06-27 MED ORDER — LIDOCAINE VISCOUS 2 % MT SOLN
OROMUCOSAL | Status: AC
  Filled 2015-06-27: qty 15

## 2015-06-27 MED ORDER — MIDAZOLAM HCL 5 MG/5ML IJ SOLN
INTRAMUSCULAR | Status: AC
Start: 2015-06-27 — End: 2015-06-27
  Filled 2015-06-27: qty 10

## 2015-06-27 MED ORDER — ONDANSETRON HCL 4 MG/2ML IJ SOLN
INTRAMUSCULAR | Status: DC | PRN
  Administered 2015-06-27: 4 mg via INTRAVENOUS

## 2015-06-27 MED ORDER — MIDAZOLAM HCL 5 MG/5ML IJ SOLN
INTRAMUSCULAR | Status: AC
  Filled 2015-06-27: qty 5

## 2015-06-27 MED ORDER — MEPERIDINE HCL 100 MG/ML IJ SOLN
INTRAMUSCULAR | Status: AC
  Filled 2015-06-27: qty 2

## 2015-06-27 MED ORDER — ONDANSETRON HCL 4 MG/2ML IJ SOLN
INTRAMUSCULAR | Status: AC
Start: 2015-06-27 — End: 2015-06-27
  Filled 2015-06-27: qty 2

## 2015-06-27 MED ORDER — LIDOCAINE VISCOUS 2 % MT SOLN
OROMUCOSAL | Status: DC | PRN
  Administered 2015-06-27: 5 mL via OROMUCOSAL

## 2015-06-27 MED ORDER — PROMETHAZINE HCL 25 MG/ML IJ SOLN
INTRAMUSCULAR | Status: AC
  Filled 2015-06-27: qty 1

## 2015-06-27 MED ORDER — PROMETHAZINE HCL 25 MG/ML IJ SOLN
25.0000 mg | Freq: Once | INTRAMUSCULAR | Status: AC
  Administered 2015-06-27: 25 mg via INTRAVENOUS

## 2015-06-27 MED ORDER — SODIUM CHLORIDE 0.9 % IJ SOLN
INTRAMUSCULAR | Status: AC
  Filled 2015-06-27: qty 3

## 2015-06-27 MED ORDER — SODIUM CHLORIDE 0.9 % IV SOLN
INTRAVENOUS | Status: DC
  Administered 2015-06-27: 09:00:00 via INTRAVENOUS

## 2015-06-27 MED ORDER — MEPERIDINE HCL 100 MG/ML IJ SOLN
INTRAMUSCULAR | Status: DC | PRN
  Administered 2015-06-27 (×2): 25 mg via INTRAVENOUS

## 2015-06-27 MED ORDER — MIDAZOLAM HCL 5 MG/5ML IJ SOLN
INTRAMUSCULAR | Status: DC | PRN
  Administered 2015-06-27 (×2): 2 mg via INTRAVENOUS

## 2015-06-27 NOTE — Op Note (Signed)
[facility] [address] [place], [address]   ENDOSCOPY PROCEDURE REPORT  PATIENT: Linda English, Linda English  MR#: [id number] BIRTHDATE: 11/10/1955 , 49  yrs. old GENDER: female ENDOSCOPIST: R.  Garfield Cornea, MD FACP FACG REFERRED BY:  [name], M.D. PROCEDURE DATE:  July 02, 2015 PROCEDURE:  EGD, diagnostic, Maloney dilation of esophagus and biopsy INDICATIONS:  Dyspepsia/esophageal dysphagia. MEDICATIONS: Versed 4 mg IV and Demerol 50 mg IV in divided doses. Phenergan 25 mg IV.  Zofran 4 mg IV.  Xylocaine gel orally ASA CLASS:      Class II  CONSENT: The risks, benefits, limitations, alternatives and imponderables have been discussed.  The potential for biopsy, esophogeal dilation, etc. have also been reviewed.  Questions have been answered.  All parties agreeable.  Please see the history and physical in the medical record for more information.  DESCRIPTION OF PROCEDURE: After the risks benefits and alternatives of the procedure were thoroughly explained, informed consent was obtained.  The EG-[id number] ([id number]) endoscope was introduced through the mouth and advanced to the second portion of the duodenum , limited by Without limitations. The instrument was slowly withdrawn as the mucosa was fully examined. Estimated blood loss is zero unless otherwise noted in this procedure report.    Distal esophageal erosions within 5 mm of the GE junction.  No Barrett's esophagus.  Tubular esophagus appeared patent throughout its course.  Stomach empty.  Multiple 2-4 mm benign-appearing gastric polyps.  2 cm hiatal hernia.  Patchy erythema of the gastric mucosa.  No ulcer or infiltrating process.  Patent pylorus.  Normal-appearing first and second portion of the duodenum.  The scope was withdrawn and a 54 [place] Maloney dilator was passed to full insertion easily.  A look back revealed no apparent complication related to this maneuver.  Subsequently, biopsies abnormal  gastric mucosa/gastric polyp taken for histologic study. Retroflexed views revealed as previously described and Retroflexed views revealed .     The scope was then withdrawn from the patient and the procedure completed.  COMPLICATIONS: There were no immediate complications. EBL 3 mL ENDOSCOPIC IMPRESSION: Mild erosive reflux esophagitis. Status post passage of a Maloney dilator. Hiatal hernia. Gastric polyps and abnormal gastric mucosa of doubtful clinical significance?"status post  biopsy  RECOMMENDATIONS: Increase Protonix to 40 mg twice daily for now. Follow up on pathology. Office visit with [place] in 3 months.  REPEAT EXAM:  eSigned:  R. Garfield Cornea, MD Rosalita Chessman [facility] 07-02-15 10:07 AM    CC:  CPT CODES: ICD CODES:  The ICD and CPT codes recommended by this software are interpretations from the data that the clinical staff has captured with the software.  The verification of the translation of this report to the ICD and CPT codes and modifiers is the sole responsibility of the health care institution and practicing physician where this report was generated.  [employer] will not be held responsible for the validity of the ICD and CPT codes included on this report.  AMA assumes no liability for data contained or not contained herein. CPT is a Designer, television/film set of the [employer].  PATIENT NAME:  Linda English, Linda English MR#: [id number]

## 2015-06-27 NOTE — H&P (View-Only) (Signed)
Referring Provider: [name], MD Primary Care Physician:  [name], MD  Primary GI: Dr. Gala Romney   Chief Complaint  Patient presents with  . Follow-up    doing better    HPI:   Linda English is a 59 y.o. female presenting today with a history of constipation, rectal cancer status post resection in 2003. Colonoscopy on 01/15/2015 showed normal residual rectum and colon. Surgical anastomosis at approximately 5 cm from the anal verge. Repeat surveillance colonoscopy in 5 years. Failed Linzess, stating it caused "abdominal pain", and she was intolerant to Amitiza due to nausea. Concern for pelvic floor dysfunction. At last visit, prescribed Miralax BID, dulcolax tablets every day and suppository every Mon, Wed, Fri. Here for follow-up.   Today she notes she has reflux, feels "unsettled" upper abdomen despite Protonix once a day. Sometimes feels really sick before going to bed. Gallbladder absent. Feels like food doesn't "know which way to go". Associated nausea. Sometimes vague esophageal dysphagia with thicker foods. Pill dysphagia. Has tried/failed Prilosec, Nexium. States Nexium gave her a lot of gas. GES normal in 2013. Regurgitates. Every "blue purple moon" will take ibuprofen.    No diarrhea. Has to push stool out. Will go about 6 times a day sometimes, but it is soft.   Past Medical History  Diagnosis Date  . Anxiety   . Allergic rhinitis   . Acid reflux   . IBS (irritable bowel syndrome)   . Depression   . Panic disorder   . Chronic headache   . DM (diabetes mellitus) ([place])   . Hypertension   . Obstructive sleep apnea   . Lichen planus   . Arthritis     osteoarthritis  . Hiatal hernia   . Gastric erosions   . Diverticula of colon   . Hemorrhoids   . Tubular adenoma   . Gastric polyps     benign   . Bipolar disorder ([place])   . Rectal cancer ([place]) 2003    ileostomy and reversal    Past Surgical History  Procedure Laterality Date  . Spine surgery  2001    L5,S1 HEMILAMINOTOMY AND DISCECTOMY/DR DEATON  . Cholecystectomy    . Low anterior rection  2003    RECTAL CANCER  . Cesarean section      X  2  . Appendectomy    . Colonoscopy  08/2007    DR Gala Romney, friable anal canal hemorrhoids, surgical anastomosis at 3cm, distal scattered tics  . Esophagogastroduodenoscopy  05/2002    DR Gala Romney, normal  . Colonoscopy  12/15/2010    anal papilla and internal hemorrhoids/diminutive polyp in the base of the cecum. Past, tubular adenoma.. Next colonoscopy due in April 2017.  . Abdominal hysterectomy    . Esophagogastroduodenoscopy  08/03/2011    RMR: small HH/ gastric polyps  . Back surgery    . Colonoscopy  10/27/2005    RMR: Anal canal hemorrhoids. Surgical anastomosis at 3 cm from the anal verge appeared normal. Few scattered distal diverticula. The residual  colonic mucosa appeared normal. I suspect the patient bled from hemorrhoids  . Colonoscopy N/A 01/15/2015    MVH:QIONGE residual rectum and colon. next tcs 12/2019    Current Outpatient Prescriptions  Medication Sig Dispense Refill  . amLODipine-benazepril (LOTREL) 5-20 MG per capsule Take 2 capsules by mouth daily.    Marland Kitchen amphetamine-dextroamphetamine (ADDERALL) 20 MG tablet Take 20 mg by mouth 2 (two) times daily.  0  . aspirin EC 81 MG tablet Take  81 mg by mouth daily.    Marland Kitchen atorvastatin (LIPITOR) 10 MG tablet Take 10 mg by mouth at bedtime.    . Bisacodyl (DULCOLAX PO) Take by mouth as needed.    . carbamazepine (TEGRETOL XR) 200 MG 12 hr tablet Take 600 mg by mouth at bedtime.     . clonazePAM (KLONOPIN) 1 MG tablet Take 1 mg by mouth 4 (four) times daily as needed for anxiety.    . lamoTRIgine (LAMICTAL) 200 MG tablet Take 200 mg by mouth 2 (two) times daily.     . Lurasidone HCl 120 MG TABS Take 60 mg by mouth daily after supper.     . metFORMIN (GLUCOPHAGE-XR) 500 MG 24 hr tablet Take 500 mg by mouth 2 (two) times daily.    Marland Kitchen OVER THE COUNTER MEDICATION otc Zquill takes prn    . oxybutynin  (DITROPAN-XL) 10 MG 24 hr tablet Take 10 mg by mouth daily.     . pantoprazole (PROTONIX) 40 MG tablet Take 1 tablet (40 mg total) by mouth daily before breakfast. 30 tablet 5  . polyethylene glycol (MIRALAX / GLYCOLAX) packet Take 17 g by mouth 2 (two) times daily.    . ranitidine (ZANTAC) 150 MG tablet TK 1 T PO BID  8  . estradiol (ESTRACE) 0.5 MG tablet Take 0.5 mg by mouth daily.      No current facility-administered medications for this visit.    Allergies as of 06/13/2015 - Review Complete 06/13/2015  Allergen Reaction Noted  . Excedrin extra strength [aspirin-acetaminophen-caffeine] Other (See Comments) 10/28/2014    Family History  Problem Relation Age of Onset  . Colon cancer Paternal Grandfather 53  . Colon polyps Brother   . Colon polyps Cousin     Social History   Social History  . Marital Status: Divorced    Spouse Name: N/A  . Number of Children: N/A  . Years of Education: N/A   Occupational History  . teacher     [employer]   Social History Main Topics  . Smoking status: Never Smoker   . Smokeless tobacco: Never Used     Comment: Never smoked  . Alcohol Use: No  . Drug Use: No  . Sexual Activity: No   Other Topics Concern  . None   Social History Narrative    Review of Systems: As mentioned in HPI  Physical Exam: BP 144/86 mmHg  Pulse 86  Temp(Src) 98.1 F (36.7 C) (Oral)  Ht 5\' 2"  (1.575 m)  Wt 158 lb 9.6 oz (71.94 kg)  BMI 29.00 kg/m2 General:   Alert and oriented. No distress noted. Pleasant and cooperative.  Head:  Normocephalic and atraumatic. Eyes:  Conjuctiva clear without scleral icterus. Mouth:  Oral mucosa pink and moist. Good dentition. No lesions. Heart:  S1, S2 present without murmurs, rubs, or gallops. Regular rate and rhythm. Abdomen:  +BS, soft, non-tender and non-distended. No rebound or guarding. No HSM or masses noted. Msk:  Symmetrical without gross deformities. Normal posture. Extremities:  Without  edema. Neurologic:  Alert and  oriented x4;  grossly normal neurologically. Psych:  Alert and cooperative. Normal mood and affect.

## 2015-06-27 NOTE — Discharge Instructions (Signed)
EGD Discharge instructions Please read the instructions outlined below and refer to this sheet in the next few weeks. These discharge instructions provide you with general information on caring for yourself after you leave the hospital. Your doctor may also give you specific instructions. While your treatment has been planned according to the most current medical practices available, unavoidable complications occasionally occur. If you have any problems or questions after discharge, please call your doctor. ACTIVITY  You may resume your regular activity but move at a slower pace for the next 24 hours.   Take frequent rest periods for the next 24 hours.   Walking will help expel (get rid of) the air and reduce the bloated feeling in your abdomen.   No driving for 24 hours (because of the anesthesia (medicine) used during the test).   You may shower.   Do not sign any important legal documents or operate any machinery for 24 hours (because of the anesthesia used during the test).  NUTRITION  Drink plenty of fluids.   You may resume your normal diet.   Begin with a light meal and progress to your normal diet.   Avoid alcoholic beverages for 24 hours or as instructed by your caregiver.  MEDICATIONS  You may resume your normal medications unless your caregiver tells you otherwise.  WHAT YOU CAN EXPECT TODAY  You may experience abdominal discomfort such as a feeling of fullness or gas pains.  FOLLOW-UP  Your doctor will discuss the results of your test with you.  SEEK IMMEDIATE MEDICAL ATTENTION IF ANY OF THE FOLLOWING OCCUR:  Excessive nausea (feeling sick to your stomach) and/or vomiting.   Severe abdominal pain and distention (swelling).   Trouble swallowing.   Temperature over 101 F (37.8 C).   Rectal bleeding or vomiting of blood.    Increase Protonix to 40 mg twice daily  Office visit with [place] in 3 months  Further recommendations to follow pending review of  pathology report

## 2015-06-27 NOTE — Interval H&P Note (Signed)
History and Physical Interval Note:  06/27/2015 9:33 AM  Linda English  has presented today for surgery, with the diagnosis of dysphagia, dyspepsia  The various methods of treatment have been discussed with the patient and family. After consideration of risks, benefits and other options for treatment, the patient has consented to  Procedure(s) with comments: ESOPHAGOGASTRODUODENOSCOPY (EGD) (N/A) - 0930 ESOPHAGEAL DILATION (N/A) as a surgical intervention .  The patient's history has been reviewed, patient examined, no change in status, stable for surgery.  I have reviewed the patient's chart and labs.  Questions were answered to the patient's satisfaction.     [name]  No change. EGD with EGD as appropriate/feasible per plan.  The risks, benefits, limitations, alternatives and imponderables have been reviewed with the patient. Potential for esophageal dilation, biopsy, etc. have also been reviewed.  Questions have been answered. All parties agreeable.

## 2015-07-01 ENCOUNTER — Encounter: Payer: Self-pay | Admitting: Internal Medicine

## 2015-07-01 DIAGNOSIS — Z23 Encounter for immunization: Secondary | ICD-10-CM | POA: Diagnosis not present

## 2015-07-02 ENCOUNTER — Encounter (HOSPITAL_COMMUNITY): Payer: Self-pay | Admitting: Internal Medicine

## 2015-08-08 DIAGNOSIS — E039 Hypothyroidism, unspecified: Secondary | ICD-10-CM | POA: Diagnosis not present

## 2015-08-08 DIAGNOSIS — Z79899 Other long term (current) drug therapy: Secondary | ICD-10-CM | POA: Diagnosis not present

## 2015-08-08 DIAGNOSIS — E119 Type 2 diabetes mellitus without complications: Secondary | ICD-10-CM | POA: Diagnosis not present

## 2015-08-09 ENCOUNTER — Encounter (HOSPITAL_COMMUNITY): Payer: Self-pay | Admitting: Emergency Medicine

## 2015-08-09 ENCOUNTER — Emergency Department (HOSPITAL_COMMUNITY): Payer: Medicare Other

## 2015-08-09 ENCOUNTER — Inpatient Hospital Stay (HOSPITAL_COMMUNITY)
Admission: EM | Admit: 2015-08-09 | Discharge: 2015-08-11 | DRG: 069 | Disposition: A | Discharge status: Home / Self Care | Payer: Medicare Other | Attending: Internal Medicine | Admitting: Internal Medicine

## 2015-08-09 DIAGNOSIS — I10 Essential (primary) hypertension: Secondary | ICD-10-CM | POA: Diagnosis present

## 2015-08-09 DIAGNOSIS — Z7984 Long term (current) use of oral hypoglycemic drugs: Secondary | ICD-10-CM | POA: Diagnosis not present

## 2015-08-09 DIAGNOSIS — Z79899 Other long term (current) drug therapy: Secondary | ICD-10-CM | POA: Diagnosis not present

## 2015-08-09 DIAGNOSIS — K219 Gastro-esophageal reflux disease without esophagitis: Secondary | ICD-10-CM | POA: Diagnosis present

## 2015-08-09 DIAGNOSIS — E1165 Type 2 diabetes mellitus with hyperglycemia: Secondary | ICD-10-CM | POA: Diagnosis not present

## 2015-08-09 DIAGNOSIS — F319 Bipolar disorder, unspecified: Secondary | ICD-10-CM | POA: Diagnosis present

## 2015-08-09 DIAGNOSIS — F410 Panic disorder [episodic paroxysmal anxiety] without agoraphobia: Secondary | ICD-10-CM | POA: Diagnosis present

## 2015-08-09 DIAGNOSIS — Z85048 Personal history of other malignant neoplasm of rectum, rectosigmoid junction, and anus: Secondary | ICD-10-CM

## 2015-08-09 DIAGNOSIS — I639 Cerebral infarction, unspecified: Secondary | ICD-10-CM

## 2015-08-09 DIAGNOSIS — Z800 Family history of malignant neoplasm of digestive organs: Secondary | ICD-10-CM

## 2015-08-09 DIAGNOSIS — E119 Type 2 diabetes mellitus without complications: Secondary | ICD-10-CM

## 2015-08-09 DIAGNOSIS — Z7982 Long term (current) use of aspirin: Secondary | ICD-10-CM | POA: Diagnosis not present

## 2015-08-09 DIAGNOSIS — G459 Transient cerebral ischemic attack, unspecified: Secondary | ICD-10-CM | POA: Diagnosis not present

## 2015-08-09 DIAGNOSIS — M1990 Unspecified osteoarthritis, unspecified site: Secondary | ICD-10-CM | POA: Diagnosis present

## 2015-08-09 DIAGNOSIS — H532 Diplopia: Secondary | ICD-10-CM | POA: Diagnosis present

## 2015-08-09 DIAGNOSIS — N183 Chronic kidney disease, stage 3 unspecified: Secondary | ICD-10-CM

## 2015-08-09 DIAGNOSIS — K589 Irritable bowel syndrome without diarrhea: Secondary | ICD-10-CM | POA: Diagnosis present

## 2015-08-09 DIAGNOSIS — R55 Syncope and collapse: Secondary | ICD-10-CM | POA: Diagnosis not present

## 2015-08-09 DIAGNOSIS — R42 Dizziness and giddiness: Secondary | ICD-10-CM | POA: Diagnosis not present

## 2015-08-09 LAB — URINALYSIS, ROUTINE W REFLEX MICROSCOPIC
Bilirubin Urine: NEGATIVE
Glucose, UA: NEGATIVE mg/dL
Hgb urine dipstick: NEGATIVE
Ketones, ur: NEGATIVE mg/dL
LEUKOCYTES UA: NEGATIVE
NITRITE: NEGATIVE
PROTEIN: NEGATIVE mg/dL
Specific Gravity, Urine: 1.02 (ref 1.005–1.030)
pH: 8 (ref 5.0–8.0)

## 2015-08-09 LAB — GLUCOSE, CAPILLARY: GLUCOSE-CAPILLARY: 88 mg/dL (ref 65–99)

## 2015-08-09 LAB — COMPREHENSIVE METABOLIC PANEL
ALT: 14 U/L (ref 14–54)
AST: 15 U/L (ref 15–41)
Albumin: 4.2 g/dL (ref 3.5–5.0)
Alkaline Phosphatase: 121 U/L (ref 38–126)
Anion gap: 10 (ref 5–15)
BILIRUBIN TOTAL: 0.4 mg/dL (ref 0.3–1.2)
BUN: 15 mg/dL (ref 6–20)
CALCIUM: 9.6 mg/dL (ref 8.9–10.3)
CO2: 28 mmol/L (ref 22–32)
CREATININE: 0.76 mg/dL (ref 0.44–1.00)
Chloride: 102 mmol/L (ref 101–111)
GFR calc Af Amer: >60 mL/min (ref >60)
Glucose, Bld: 108 mg/dL — ABNORMAL HIGH (ref 65–99)
POTASSIUM: 3.9 mmol/L (ref 3.5–5.1)
Sodium: 140 mmol/L (ref 135–145)
TOTAL PROTEIN: 7.6 g/dL (ref 6.5–8.1)

## 2015-08-09 LAB — DIFFERENTIAL
BASOS ABS: 0 10*3/uL (ref 0.0–0.1)
BASOS PCT: 0 %
EOS ABS: 0.1 10*3/uL (ref 0.0–0.7)
Eosinophils Relative: 1 %
LYMPHS ABS: 1.3 10*3/uL (ref 0.7–4.0)
Lymphocytes Relative: 26 %
MONOS PCT: 8 %
Monocytes Absolute: 0.4 10*3/uL (ref 0.1–1.0)
NEUTROS ABS: 3.2 10*3/uL (ref 1.7–7.7)
NEUTROS PCT: 65 %

## 2015-08-09 LAB — RAPID URINE DRUG SCREEN, HOSP PERFORMED
Amphetamines: POSITIVE — AB
BARBITURATES: NOT DETECTED
Benzodiazepines: NOT DETECTED
Cocaine: NOT DETECTED
Opiates: NOT DETECTED
Tetrahydrocannabinol: NOT DETECTED

## 2015-08-09 LAB — ETHANOL

## 2015-08-09 LAB — CBC
HCT: 40.1 % (ref 36.0–46.0)
Hemoglobin: 13.2 g/dL (ref 12.0–15.0)
MCH: 27.7 pg (ref 26.0–34.0)
MCHC: 32.9 g/dL (ref 30.0–36.0)
MCV: 84.2 fL (ref 78.0–100.0)
PLATELETS: 315 10*3/uL (ref 150–400)
RBC: 4.76 MIL/uL (ref 3.87–5.11)
RDW: 13.5 % (ref 11.5–15.5)
WBC: 5 10*3/uL (ref 4.0–10.5)

## 2015-08-09 LAB — I-STAT CHEM 8, ED
BUN: 18 mg/dL (ref 6–20)
CHLORIDE: 102 mmol/L (ref 101–111)
Calcium, Ion: 1.17 mmol/L (ref 1.12–1.23)
Creatinine, Ser: 0.7 mg/dL (ref 0.44–1.00)
Glucose, Bld: 100 mg/dL — ABNORMAL HIGH (ref 65–99)
HCT: 43 % (ref 36.0–46.0)
Hemoglobin: 14.6 g/dL (ref 12.0–15.0)
POTASSIUM: 4.5 mmol/L (ref 3.5–5.1)
SODIUM: 141 mmol/L (ref 135–145)
TCO2: 30 mmol/L (ref 0–100)

## 2015-08-09 LAB — PROTIME-INR
INR: 0.98 (ref 0.00–1.49)
PROTHROMBIN TIME: 13.2 s (ref 11.6–15.2)

## 2015-08-09 LAB — APTT: APTT: 28 s (ref 24–37)

## 2015-08-09 LAB — I-STAT TROPONIN, ED: TROPONIN I, POC: 0 ng/mL (ref 0.00–0.08)

## 2015-08-09 MED ORDER — FAMOTIDINE 20 MG PO TABS
20.0000 mg | ORAL_TABLET | Freq: Every day | ORAL | Status: DC
  Administered 2015-08-10 – 2015-08-11 (×2): 20 mg via ORAL
  Filled 2015-08-09 (×2): qty 1

## 2015-08-09 MED ORDER — ATORVASTATIN CALCIUM 10 MG PO TABS
10.0000 mg | ORAL_TABLET | Freq: Every day | ORAL | Status: DC
  Administered 2015-08-09 – 2015-08-10 (×2): 10 mg via ORAL
  Filled 2015-08-09 (×2): qty 1

## 2015-08-09 MED ORDER — OXYBUTYNIN CHLORIDE ER 5 MG PO TB24
10.0000 mg | ORAL_TABLET | Freq: Every day | ORAL | Status: DC
  Administered 2015-08-10 – 2015-08-11 (×2): 10 mg via ORAL
  Filled 2015-08-09 (×3): qty 2

## 2015-08-09 MED ORDER — INSULIN ASPART 100 UNIT/ML ~~LOC~~ SOLN: 0.0000 [IU] | Freq: Three times a day (TID) | SUBCUTANEOUS | Status: DC

## 2015-08-09 MED ORDER — ASPIRIN EC 81 MG PO TBEC
81.0000 mg | DELAYED_RELEASE_TABLET | Freq: Every day | ORAL | Status: DC
Start: 2015-08-10 — End: 2015-08-10

## 2015-08-09 MED ORDER — POLYETHYLENE GLYCOL 3350 17 G PO PACK
17.0000 g | PACK | Freq: Two times a day (BID) | ORAL | Status: DC
  Administered 2015-08-09 – 2015-08-11 (×4): 17 g via ORAL
  Filled 2015-08-09 (×4): qty 1

## 2015-08-09 MED ORDER — HYDROCODONE-ACETAMINOPHEN 5-325 MG PO TABS
1.0000 | ORAL_TABLET | Freq: Once | ORAL | Status: DC
  Filled 2015-08-09: qty 1

## 2015-08-09 MED ORDER — SODIUM CHLORIDE 0.9 % IV SOLN
INTRAVENOUS | Status: DC
  Administered 2015-08-09: 14:00:00 via INTRAVENOUS
  Administered 2015-08-09 – 2015-08-10 (×2): 75 mL/h via INTRAVENOUS
  Administered 2015-08-10: 21:00:00 via INTRAVENOUS

## 2015-08-09 MED ORDER — SODIUM CHLORIDE 0.9 % IV BOLUS (SEPSIS)
250.0000 mL | Freq: Once | INTRAVENOUS | Status: AC
Start: 2015-08-09 — End: 2015-08-09
  Administered 2015-08-09: 250 mL via INTRAVENOUS

## 2015-08-09 MED ORDER — STROKE: EARLY STAGES OF RECOVERY BOOK
Freq: Once | Status: AC
  Administered 2015-08-10: 1
  Filled 2015-08-09: qty 1

## 2015-08-09 MED ORDER — INSULIN ASPART 100 UNIT/ML ~~LOC~~ SOLN: 0.0000 [IU] | Freq: Every day | SUBCUTANEOUS | Status: DC

## 2015-08-09 MED ORDER — LURASIDONE HCL 40 MG PO TABS
60.0000 mg | ORAL_TABLET | Freq: Every day | ORAL | Status: DC
  Administered 2015-08-09 – 2015-08-10 (×2): 60 mg via ORAL
  Filled 2015-08-09 (×4): qty 1

## 2015-08-09 MED ORDER — ENOXAPARIN SODIUM 40 MG/0.4ML ~~LOC~~ SOLN
40.0000 mg | SUBCUTANEOUS | Status: DC
  Administered 2015-08-09 – 2015-08-10 (×2): 40 mg via SUBCUTANEOUS
  Filled 2015-08-09 (×2): qty 0.4

## 2015-08-09 MED ORDER — ASPIRIN 81 MG PO CHEW
324.0000 mg | CHEWABLE_TABLET | Freq: Once | ORAL | Status: AC
  Administered 2015-08-09: 324 mg via ORAL
  Filled 2015-08-09: qty 4

## 2015-08-09 MED ORDER — METFORMIN HCL ER 500 MG PO TB24
500.0000 mg | ORAL_TABLET | Freq: Two times a day (BID) | ORAL | Status: DC
  Administered 2015-08-10 – 2015-08-11 (×3): 500 mg via ORAL
  Filled 2015-08-09 (×3): qty 1

## 2015-08-09 MED ORDER — PANTOPRAZOLE SODIUM 40 MG PO TBEC
40.0000 mg | DELAYED_RELEASE_TABLET | Freq: Every day | ORAL | Status: DC
  Administered 2015-08-10 – 2015-08-11 (×2): 40 mg via ORAL
  Filled 2015-08-09 (×2): qty 1

## 2015-08-09 MED ORDER — CLONAZEPAM 0.5 MG PO TABS: 1.0000 mg | ORAL_TABLET | Freq: Three times a day (TID) | ORAL | Status: DC | PRN

## 2015-08-09 MED ORDER — AMLODIPINE BESY-BENAZEPRIL HCL 5-20 MG PO CAPS: 2.0000 | ORAL_CAPSULE | Freq: Every day | ORAL | Status: DC

## 2015-08-09 MED ORDER — IOHEXOL 350 MG/ML SOLN
75.0000 mL | Freq: Once | INTRAVENOUS | Status: AC | PRN
  Administered 2015-08-09: 100 mL via INTRAVENOUS

## 2015-08-09 MED ORDER — CARBAMAZEPINE ER 400 MG PO TB12
600.0000 mg | ORAL_TABLET | Freq: Every day | ORAL | Status: DC
  Administered 2015-08-09 – 2015-08-10 (×2): 600 mg via ORAL
  Filled 2015-08-09 (×4): qty 1

## 2015-08-09 MED ORDER — AMPHETAMINE-DEXTROAMPHETAMINE 10 MG PO TABS
20.0000 mg | ORAL_TABLET | Freq: Every day | ORAL | Status: DC
  Administered 2015-08-10 – 2015-08-11 (×2): 20 mg via ORAL
  Filled 2015-08-09 (×2): qty 2

## 2015-08-09 MED ORDER — CARBAMAZEPINE ER 100 MG PO TB12
ORAL_TABLET | ORAL | Status: AC
  Filled 2015-08-09: qty 6

## 2015-08-09 MED ORDER — AMLODIPINE BESYLATE 5 MG PO TABS
10.0000 mg | ORAL_TABLET | Freq: Every day | ORAL | Status: DC
  Administered 2015-08-10 – 2015-08-11 (×2): 10 mg via ORAL
  Filled 2015-08-09 (×2): qty 2

## 2015-08-09 MED ORDER — LURASIDONE HCL 60 MG PO TABS: 60.0000 mg | ORAL_TABLET | Freq: Every day | ORAL | Status: DC

## 2015-08-09 MED ORDER — LAMOTRIGINE 100 MG PO TABS
200.0000 mg | ORAL_TABLET | Freq: Two times a day (BID) | ORAL | Status: DC
  Administered 2015-08-09 – 2015-08-11 (×4): 200 mg via ORAL
  Filled 2015-08-09 (×4): qty 2

## 2015-08-09 MED ORDER — BENAZEPRIL HCL 10 MG PO TABS
40.0000 mg | ORAL_TABLET | Freq: Every day | ORAL | Status: DC
  Administered 2015-08-10 – 2015-08-11 (×2): 40 mg via ORAL
  Filled 2015-08-09 (×2): qty 4

## 2015-08-09 MED ORDER — ONDANSETRON HCL 4 MG PO TABS
4.0000 mg | ORAL_TABLET | Freq: Three times a day (TID) | ORAL | Status: DC | PRN
  Administered 2015-08-11: 4 mg via ORAL
  Filled 2015-08-09: qty 1

## 2015-08-09 NOTE — H&P (Signed)
[facility] History and Physical  Linda English [id number] DOB: 06-17-1956 DOA: 08/09/2015  Referring physician: ER PCP: [name], MD   Chief Complaint: Dizziness, abnormal speech, double vision  HPI: Linda English is a 59 y.o. female  This is a 59 year old lady who at 8:30 this morning had sudden onset of dizziness, abnormal speech with slurred speech and double vision. The vision changes were on the right side of her vision. She tells me that her gait was abnormal and she was afraid she was going to fall. She felt very unsteady when walking. She had an episode like this approximately 1 week ago. There is been no nausea or vomiting. There is no headache. There is no photophobia. There was no limb weakness. Evaluation emergency room has not shown any significant abnormalities including CT angiogram of the brain. She is diabetic and has bipolar disorder. She is now being admitted for further investigations.   Review of Systems:  Apart from symptoms above, all systems are negative.  Past Medical History  Diagnosis Date  . Anxiety   . Allergic rhinitis   . Acid reflux   . IBS (irritable bowel syndrome)   . Depression   . Panic disorder   . Chronic headache   . DM (diabetes mellitus) ([place])   . Hypertension   . Obstructive sleep apnea   . Lichen planus   . Arthritis     osteoarthritis  . Hiatal hernia   . Gastric erosions   . Diverticula of colon   . Hemorrhoids   . Tubular adenoma   . Gastric polyps     benign   . Bipolar disorder ([place])   . Rectal cancer ([place]) 2003    ileostomy and reversal   Past Surgical History  Procedure Laterality Date  . Spine surgery  2001    L5,S1 HEMILAMINOTOMY AND DISCECTOMY/DR DEATON  . Cholecystectomy    . Low anterior rection  2003    RECTAL CANCER  . Cesarean section      X  2  . Appendectomy    . Colonoscopy  08/2007    DR Gala Romney, friable anal canal hemorrhoids, surgical anastomosis at 3cm, distal scattered  tics  . Esophagogastroduodenoscopy  05/2002    DR Gala Romney, normal  . Colonoscopy  12/15/2010    anal papilla and internal hemorrhoids/diminutive polyp in the base of the cecum. Past, tubular adenoma.. Next colonoscopy due in April 2017.  . Abdominal hysterectomy    . Esophagogastroduodenoscopy  08/03/2011    RMR: small HH/ gastric polyps  . Back surgery    . Colonoscopy  10/27/2005    RMR: Anal canal hemorrhoids. Surgical anastomosis at 3 cm from the anal verge appeared normal. Few scattered distal diverticula. The residual  colonic mucosa appeared normal. I suspect the patient bled from hemorrhoids  . Colonoscopy N/A 01/15/2015    [id number] residual rectum and colon. next tcs 12/2019  . Esophagogastroduodenoscopy N/A 06/27/2015    Procedure: ESOPHAGOGASTRODUODENOSCOPY (EGD);  Surgeon: Daneil Dolin, MD;  [facility]: [facility];  Service: Endoscopy;  Laterality: N/A;  0930  . Esophageal dilation N/A 06/27/2015    Procedure: ESOPHAGEAL DILATION;  Surgeon: Daneil Dolin, MD;  Location: [facility];  Service: Endoscopy;  Laterality: N/A;   Social History:  reports that she has never smoked. She has never used smokeless tobacco. She reports that she does not drink alcohol or use illicit drugs.  Allergies  Allergen Reactions  . Lithium Nausea And Vomiting  . Excedrin  Extra Strength [Aspirin-Acetaminophen-Caffeine] Other (See Comments)    Makes her nervous.    Family History  Problem Relation Age of Onset  . Colon cancer Paternal Grandfather 57  . Colon polyps Brother   . Colon polyps Cousin     Prior to Admission medications   Medication Sig Start Date End Date Taking? Authorizing Provider  amLODipine-benazepril (LOTREL) 5-20 MG per capsule Take 2 capsules by mouth daily. 06/13/12  Yes Darrol Jump, MD  amphetamine-dextroamphetamine (ADDERALL) 20 MG tablet Take 20 mg by mouth daily.  01/06/15  Yes Historical Provider, MD  aspirin EC 81 MG tablet Take 81 mg by mouth daily.   Yes  Historical Provider, MD  atorvastatin (LIPITOR) 10 MG tablet Take 10 mg by mouth at bedtime.   Yes Historical Provider, MD  Bisacodyl (DULCOLAX PO) Take by mouth as needed.   Yes Historical Provider, MD  carbamazepine (TEGRETOL XR) 200 MG 12 hr tablet Take 600 mg by mouth at bedtime.  05/30/13  Yes Historical Provider, MD  clonazePAM (KLONOPIN) 1 MG tablet Take 1 mg by mouth 4 (four) times daily as needed for anxiety.   Yes Historical Provider, MD  lamoTRIgine (LAMICTAL) 200 MG tablet Take 200 mg by mouth 2 (two) times daily.  06/11/13  Yes Historical Provider, MD  LATUDA 60 MG TABS Take 60 mg by mouth at bedtime.  08/01/15  Yes Historical Provider, MD  metFORMIN (GLUCOPHAGE-XR) 500 MG 24 hr tablet Take 500 mg by mouth 2 (two) times daily. 06/13/12  Yes Darrol Jump, MD  ondansetron (ZOFRAN) 4 MG tablet Take 1 tablet (4 mg total) by mouth every 8 (eight) hours as needed for nausea or vomiting. 06/13/15  Yes Orvil Feil, NP  OVER THE COUNTER MEDICATION Take by mouth at bedtime as needed (for sleep). OTC Zquill takes prn   Yes Historical Provider, MD  oxybutynin (DITROPAN-XL) 10 MG 24 hr tablet Take 10 mg by mouth daily.  06/14/13  Yes Historical Provider, MD  pantoprazole (PROTONIX) 40 MG tablet Take 1 tablet (40 mg total) by mouth daily before breakfast. 02/17/15  Yes Mahala Menghini, PA-C  polyethylene glycol (MIRALAX / GLYCOLAX) packet Take 17 g by mouth 2 (two) times daily.   Yes Historical Provider, MD  ranitidine (ZANTAC) 150 MG tablet TAKE ONE TABLET BY MOUTH TWICE DAILY 04/22/15  Yes Historical Provider, MD   Physical Exam: Filed Vitals:   08/09/15 1545 08/09/15 1600 08/09/15 1615 08/09/15 1737  BP: 147/78 147/71 144/72 153/73  Pulse: 92 26  101  Temp:    97.9 F (36.6 C)  TempSrc:    Oral  Resp:      Height:      Weight:      SpO2: 99% 71%  100%    Wt Readings from Last 3 Encounters:  08/09/15 71.668 kg (158 lb)  06/13/15 71.94 kg (158 lb 9.6 oz)  05/14/15 72.213 kg (159 lb 3.2  oz)    General:  Appears calm and comfortable. Her speech is still somewhat slurred. However, she is able to answer all my questions appropriately. Eyes: PERRL, normal lids, irises & conjunctiva ENT: grossly normal hearing, lips & tongue Neck: no LAD, masses or thyromegaly Cardiovascular: RRR, no m/r/g. No LE edema. Telemetry: SR, no arrhythmias  Respiratory: CTA bilaterally, no w/r/r. Normal respiratory effort. Abdomen: soft, ntnd Skin: no rash or induration seen on limited exam Musculoskeletal: grossly normal tone BUE/BLE Psychiatric: grossly normal mood and affect, speech fluent and appropriate Neurologic: grossly non-focal.  in particular, there are no clear cerebellar signs except for the right side she is unsteady with finger to nose test.           Labs on Admission:  Basic Metabolic Panel:  Recent Labs Lab 08/09/15 1120 08/09/15 1201  NA 140 141  K 3.9 4.5  CL 102 102  CO2 28  --   GLUCOSE 108* 100*  BUN 15 18  CREATININE 0.76 0.70  CALCIUM 9.6  --    Liver Function Tests:  Recent Labs Lab 08/09/15 1120  AST 15  ALT 14  ALKPHOS 121  BILITOT 0.4  PROT 7.6  ALBUMIN 4.2   No results for input(s): LIPASE, AMYLASE in the last 168 hours. No results for input(s): AMMONIA in the last 168 hours. CBC:  Recent Labs Lab 08/09/15 1120 08/09/15 1201  WBC 5.0  --   NEUTROABS 3.2  --   HGB 13.2 14.6  HCT 40.1 43.0  MCV 84.2  --   PLT 315  --    Cardiac Enzymes: No results for input(s): CKTOTAL, CKMB, CKMBINDEX, TROPONINI in the last 168 hours.  BNP (last 3 results) No results for input(s): BNP in the last 8760 hours.  ProBNP (last 3 results) No results for input(s): PROBNP in the last 8760 hours.  CBG: No results for input(s): GLUCAP in the last 168 hours.  Radiological Exams on Admission: Ct Angio Head W/cm &/or Wo Cm  08/09/2015  CLINICAL DATA:  59 year old female with right side double vision and dizziness since 0830 hours. Code stroke. Initial  encounter. EXAM: CT ANGIOGRAPHY HEAD AND NECK TECHNIQUE: Multidetector CT imaging of the head and neck was performed using the standard protocol during bolus administration of intravenous contrast. Multiplanar CT image reconstructions and MIPs were obtained to evaluate the vascular anatomy. Carotid stenosis measurements (when applicable) are obtained utilizing NASCET criteria, using the distal internal carotid diameter as the denominator. CONTRAST:  156mL OMNIPAQUE IOHEXOL 350 MG/ML SOLN, 13mL OMNIPAQUE IOHEXOL 350 MG/ML SOLN COMPARISON:  Head CT without contrast 1133 hours today. Cervical spine MRI 03/19/2004. FINDINGS: CTA NECK Skeleton: Previous C6-C7 ACDF. Degenerative osseous changes throughout the cervical spine. No acute osseous abnormality identified. Visualized paranasal sinuses and mastoids are clear. Other neck: Cardiomegaly. No pericardial effusion. Mild dependent pulmonary atelectasis. No mediastinal lymphadenopathy. Thyroid, larynx, pharynx, parapharyngeal spaces, retropharyngeal space (retropharyngeal course of both carotids), sublingual space, submandibular glands, parotid glands, and orbits soft tissues are within normal limits. No cervical lymphadenopathy. Aortic arch: 3 vessel arch configuration. Minimal arch atherosclerosis. No great vessel origin stenosis. Right carotid system: Tortuous brachiocephalic artery. Normal right CCA origin. Tortuous proximal right CCA. Mild calcified plaque at the medial right carotid bifurcation. No right ICA origin or bulb stenosis. Partially retropharyngeal course of the cervical right ICA which otherwise appears normal to the skullbase. Left carotid system: Normal left CCA origin. Tortuous proximal left CCA with a an acutely kinked appearance (series 4, image 63). Negative left carotid bifurcation. Retropharyngeal course of the proximal left ICA. No left ICA atherosclerosis or stenosis in the neck. Vertebral arteries:No proximal right subclavian artery stenosis.  Normal right vertebral artery origin (series 610, image 55). No right vertebral artery stenosis identified to the skullbase. No proximal left subclavian stenosis. Tortuous proximal left subclavian at the thoracic inlet. Normal left vertebral artery origin. Tortuous left V1 segment. No cervical left vertebral artery stenosis. CTA HEAD Posterior circulation: Codominant distal vertebral arteries are patent without stenosis. PICA origins and vertebrobasilar junction are patent. No basilar artery stenosis.  SCA and PCA origins are normal. Posterior communicating arteries are diminutive or absent. Bilateral PCA branches are within normal limits. Anterior circulation: Both ICA siphons are patent. Calcified cavernous segment plaque greater on the left. No hemodynamically significant siphon stenosis. Ophthalmic artery origins are within normal limits. Carotid termini carotid termini are patent. Dominant right ACA A1 segment, the left is diminutive. Anterior communicating artery and bilateral ACA branches are within normal limits. Bilateral MCA M1 segments are patent. Left M1 segment, left MCA bifurcation, and left MCA branches are within normal limits. Right M1 segment, MCA bifurcation, and right MCA branches are within normal limits. Venous sinuses: Patent. Anatomic variants: Dominant right ACA A1 segment. Delayed phase: No abnormal enhancement identified. No cortically based acute infarct identified. IMPRESSION: 1. Negative intracranial CTA, with no emergent large vessel occlusion. 2. Very mild carotid atherosclerosis at the right carotid bifurcation and in both ICA siphons. No associated stenosis. 3. No vertebral artery stenosis. 4. Stable and negative CT appearance of the brain. 5. Cardiomegaly.  Cervical spine degeneration, previous C6-C7 ACDF. Electronically Signed   By: Genevie Ann M.D.   On: 08/09/2015 15:13   Ct Head Wo Contrast  08/09/2015  CLINICAL DATA:  Slurred speech with dizziness and blurred vision EXAM: CT  HEAD WITHOUT CONTRAST TECHNIQUE: Contiguous axial images were obtained from the base of the skull through the vertex without intravenous contrast. COMPARISON:  None. FINDINGS: The ventricles are normal in size and configuration. There is mild cerebellar atrophy. There is no intracranial mass, hemorrhage, extra-axial fluid collection, or midline shift. The gray-white compartments appear normal. No acute infarct evident. The middle cerebral artery attenuation is symmetric and normal bilaterally. The bony calvarium appears intact. The mastoid air cells are clear. No intraorbital lesions are identified. IMPRESSION: Mild cerebellar atrophy. No intracranial mass, hemorrhage, or edema. No acute infarct evident. Critical Value/emergent results were called by telephone at the time of interpretation on 08/09/2015 at 11:51 am to Dr. Fredia Sorrow , who verbally acknowledged these results. Electronically Signed   By: Lowella Grip III M.D.   On: 08/09/2015 11:51   Ct Angio Neck W/cm &/or Wo/cm  08/09/2015  CLINICAL DATA:  59 year old female with right side double vision and dizziness since 0830 hours. Code stroke. Initial encounter. EXAM: CT ANGIOGRAPHY HEAD AND NECK TECHNIQUE: Multidetector CT imaging of the head and neck was performed using the standard protocol during bolus administration of intravenous contrast. Multiplanar CT image reconstructions and MIPs were obtained to evaluate the vascular anatomy. Carotid stenosis measurements (when applicable) are obtained utilizing NASCET criteria, using the distal internal carotid diameter as the denominator. CONTRAST:  1100mL OMNIPAQUE IOHEXOL 350 MG/ML SOLN, 158mL OMNIPAQUE IOHEXOL 350 MG/ML SOLN COMPARISON:  Head CT without contrast 1133 hours today. Cervical spine MRI 03/19/2004. FINDINGS: CTA NECK Skeleton: Previous C6-C7 ACDF. Degenerative osseous changes throughout the cervical spine. No acute osseous abnormality identified. Visualized paranasal sinuses and  mastoids are clear. Other neck: Cardiomegaly. No pericardial effusion. Mild dependent pulmonary atelectasis. No mediastinal lymphadenopathy. Thyroid, larynx, pharynx, parapharyngeal spaces, retropharyngeal space (retropharyngeal course of both carotids), sublingual space, submandibular glands, parotid glands, and orbits soft tissues are within normal limits. No cervical lymphadenopathy. Aortic arch: 3 vessel arch configuration. Minimal arch atherosclerosis. No great vessel origin stenosis. Right carotid system: Tortuous brachiocephalic artery. Normal right CCA origin. Tortuous proximal right CCA. Mild calcified plaque at the medial right carotid bifurcation. No right ICA origin or bulb stenosis. Partially retropharyngeal course of the cervical right ICA which otherwise appears normal to the  skullbase. Left carotid system: Normal left CCA origin. Tortuous proximal left CCA with a an acutely kinked appearance (series 4, image 63). Negative left carotid bifurcation. Retropharyngeal course of the proximal left ICA. No left ICA atherosclerosis or stenosis in the neck. Vertebral arteries:No proximal right subclavian artery stenosis. Normal right vertebral artery origin (series 610, image 55). No right vertebral artery stenosis identified to the skullbase. No proximal left subclavian stenosis. Tortuous proximal left subclavian at the thoracic inlet. Normal left vertebral artery origin. Tortuous left V1 segment. No cervical left vertebral artery stenosis. CTA HEAD Posterior circulation: Codominant distal vertebral arteries are patent without stenosis. PICA origins and vertebrobasilar junction are patent. No basilar artery stenosis. SCA and PCA origins are normal. Posterior communicating arteries are diminutive or absent. Bilateral PCA branches are within normal limits. Anterior circulation: Both ICA siphons are patent. Calcified cavernous segment plaque greater on the left. No hemodynamically significant siphon stenosis.  Ophthalmic artery origins are within normal limits. Carotid termini carotid termini are patent. Dominant right ACA A1 segment, the left is diminutive. Anterior communicating artery and bilateral ACA branches are within normal limits. Bilateral MCA M1 segments are patent. Left M1 segment, left MCA bifurcation, and left MCA branches are within normal limits. Right M1 segment, MCA bifurcation, and right MCA branches are within normal limits. Venous sinuses: Patent. Anatomic variants: Dominant right ACA A1 segment. Delayed phase: No abnormal enhancement identified. No cortically based acute infarct identified. IMPRESSION: 1. Negative intracranial CTA, with no emergent large vessel occlusion. 2. Very mild carotid atherosclerosis at the right carotid bifurcation and in both ICA siphons. No associated stenosis. 3. No vertebral artery stenosis. 4. Stable and negative CT appearance of the brain. 5. Cardiomegaly.  Cervical spine degeneration, previous C6-C7 ACDF. Electronically Signed   By: Genevie Ann M.D.   On: 08/09/2015 15:13      Assessment/Plan   1. CVA. 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 I will order MRI brain scan, 1 is not available today. Neurology consultation. She is beyond the time for TPA. 2. Diabetes. Continue with home medications and sliding scale insulin. 3. Hypertension. Continue with home medications. 4. Bipolar disorder. Continue with home medications.  She'll be admitted to the medical floor. Further recommendations will depend on patient's hospital progress.  Code Status: Full code.   DVT Prophylaxis: Lovenox.  Family Communication: I discussed the plan with the patient at the bedside.   Disposition Plan: Home when medically stable.  Time spent: 60 minutes.  Doree Albee [facility] Pager [phone].

## 2015-08-09 NOTE — ED Notes (Signed)
teleneurology consult being done.  Neurologist requested pt be unhooked from monitor and blood pressure cuff, pulse ox.

## 2015-08-09 NOTE — ED Notes (Signed)
Called SOC to let them know we has another monitor ready for consult  in Pt rm.

## 2015-08-09 NOTE — ED Notes (Signed)
.    Unable to do teleneurology with this machine.

## 2015-08-09 NOTE — Progress Notes (Signed)
CODE STROKE 11:22 phone call, 11:30 beeper,11:36 scan complete, 11:45 called [facility] spoke with Stacey./bbj

## 2015-08-09 NOTE — ED Notes (Addendum)
With neuro assessment.  Pt states symptoms started around 830am.  C/o double vision.  When doing exam .  Vision double in right eye.  Also right grip slightly weaker. Speech slurred per family. Otherwise exam normal.  ER physician notified of findings.

## 2015-08-09 NOTE — ED Notes (Signed)
Pt states her vision is getting a little clearer.

## 2015-08-09 NOTE — ED Provider Notes (Addendum)
CSN: [id number]     Arrival date & time 08/09/15  1051 History  By signing my name below, I, Hilda Lias, attest that this documentation has been prepared under the direction and in the presence of Fredia Sorrow, MD. Electronically Signed: Hilda Lias, ED Scribe. 08/09/2015. 11:30 AM.    Chief Complaint  Patient presents with  . Code Stroke     Patient is a 59 y.o. female presenting with dizziness. The history is provided by the patient. No language interpreter was used.  Dizziness Quality:  Imbalance Severity:  Mild Onset quality:  Sudden Duration:  3 hours Timing:  Constant Progression:  Unchanged Chronicity:  New Relieved by:  Nothing Worsened by:  Nothing Ineffective treatments:  None tried Associated symptoms: chest pain   Associated symptoms: no headaches, no nausea, no shortness of breath and no vomiting   Risk factors: no hx of stroke    HPI Comments: Linda English is a 59 y.o. female who presents to the Emergency Department complaining of constant dizziness with associated double vision that has been present since around 0845 this morning. Pt states that she awoke and got in the shower this morning, and then became dizzy and started seeing double when she got out. Pt states both symptoms began at once, and reports her symptoms are currently unchanged. Pt states she had no symptoms when she went to bed. Her family members state her speech is different than usual, and states that she had balance and coordination problems while walking prior to arriving to ED.    Past Medical History  Diagnosis Date  . Anxiety   . Allergic rhinitis   . Acid reflux   . IBS (irritable bowel syndrome)   . Depression   . Panic disorder   . Chronic headache   . DM (diabetes mellitus) ([place])   . Hypertension   . Obstructive sleep apnea   . Lichen planus   . Arthritis     osteoarthritis  . Hiatal hernia   . Gastric erosions   . Diverticula of colon   . Hemorrhoids   .  Tubular adenoma   . Gastric polyps     benign   . Bipolar disorder ([place])   . Rectal cancer ([place]) 2003    ileostomy and reversal   Past Surgical History  Procedure Laterality Date  . Spine surgery  2001    L5,S1 HEMILAMINOTOMY AND DISCECTOMY/DR DEATON  . Cholecystectomy    . Low anterior rection  2003    RECTAL CANCER  . Cesarean section      X  2  . Appendectomy    . Colonoscopy  08/2007    DR Gala Romney, friable anal canal hemorrhoids, surgical anastomosis at 3cm, distal scattered tics  . Esophagogastroduodenoscopy  05/2002    DR Gala Romney, normal  . Colonoscopy  12/15/2010    anal papilla and internal hemorrhoids/diminutive polyp in the base of the cecum. Past, tubular adenoma.. Next colonoscopy due in April 2017.  . Abdominal hysterectomy    . Esophagogastroduodenoscopy  08/03/2011    RMR: small HH/ gastric polyps  . Back surgery    . Colonoscopy  10/27/2005    RMR: Anal canal hemorrhoids. Surgical anastomosis at 3 cm from the anal verge appeared normal. Few scattered distal diverticula. The residual  colonic mucosa appeared normal. I suspect the patient bled from hemorrhoids  . Colonoscopy N/A 01/15/2015    [id number] residual rectum and colon. next tcs 12/2019  . Esophagogastroduodenoscopy N/A 06/27/2015  Procedure: ESOPHAGOGASTRODUODENOSCOPY (EGD);  Surgeon: Daneil Dolin, MD;  [facility]: [facility];  Service: Endoscopy;  Laterality: N/A;  0930  . Esophageal dilation N/A 06/27/2015    Procedure: ESOPHAGEAL DILATION;  Surgeon: Daneil Dolin, MD;  Location: [facility];  Service: Endoscopy;  Laterality: N/A;   Family History  Problem Relation Age of Onset  . Colon cancer Paternal Grandfather 67  . Colon polyps Brother   . Colon polyps Cousin    Social History  Substance Use Topics  . Smoking status: Never Smoker   . Smokeless tobacco: Never Used     Comment: Never smoked  . Alcohol Use: No   OB History    No data available     Review of Systems  Constitutional:  Negative for fever.  HENT: Negative for congestion.   Eyes: Positive for visual disturbance.  Respiratory: Negative for shortness of breath.   Cardiovascular: Positive for chest pain.  Gastrointestinal: Negative for nausea, vomiting and abdominal pain.  Genitourinary: Negative for dysuria.  Musculoskeletal: Positive for gait problem.  Skin: Negative for rash.  Neurological: Positive for dizziness. Negative for headaches.  Hematological: Does not bruise/bleed easily.  Psychiatric/Behavioral: Negative for confusion.  All other systems reviewed and are negative.     Allergies  Lithium and Excedrin extra strength  Home Medications   Prior to Admission medications   Medication Sig Start Date End Date Taking? Authorizing Provider  amLODipine-benazepril (LOTREL) 5-20 MG per capsule Take 2 capsules by mouth daily. 06/13/12   Darrol Jump, MD  amphetamine-dextroamphetamine (ADDERALL) 20 MG tablet Take 20 mg by mouth 2 (two) times daily. 01/06/15   Historical Provider, MD  aspirin EC 81 MG tablet Take 81 mg by mouth daily.    Historical Provider, MD  atorvastatin (LIPITOR) 10 MG tablet Take 10 mg by mouth at bedtime.    Historical Provider, MD  Bisacodyl (DULCOLAX PO) Take by mouth as needed.    Historical Provider, MD  carbamazepine (TEGRETOL XR) 200 MG 12 hr tablet Take 600 mg by mouth at bedtime.  05/30/13   Historical Provider, MD  clonazePAM (KLONOPIN) 1 MG tablet Take 1 mg by mouth 4 (four) times daily as needed for anxiety.    Historical Provider, MD  estradiol (ESTRACE) 0.5 MG tablet Take 0.5 mg by mouth daily.  06/14/13   Historical Provider, MD  lamoTRIgine (LAMICTAL) 200 MG tablet Take 200 mg by mouth 2 (two) times daily.  06/11/13   Historical Provider, MD  LATUDA 60 MG TABS Take 60 mg by mouth daily. 08/01/15   Historical Provider, MD  Lurasidone HCl 120 MG TABS Take 60 mg by mouth daily after supper.     Historical Provider, MD  metFORMIN (GLUCOPHAGE-XR) 500 MG 24 hr tablet Take  500 mg by mouth 2 (two) times daily. 06/13/12   Darrol Jump, MD  ondansetron (ZOFRAN) 4 MG tablet Take 1 tablet (4 mg total) by mouth every 8 (eight) hours as needed for nausea or vomiting. 06/13/15   Orvil Feil, NP  OVER THE COUNTER MEDICATION otc [place] takes prn    Historical Provider, MD  oxybutynin (DITROPAN-XL) 10 MG 24 hr tablet Take 10 mg by mouth daily.  06/14/13   Historical Provider, MD  pantoprazole (PROTONIX) 40 MG tablet Take 1 tablet (40 mg total) by mouth daily before breakfast. 02/17/15   Mahala Menghini, PA-C  polyethylene glycol (MIRALAX / GLYCOLAX) packet Take 17 g by mouth 2 (two) times daily.  Historical Provider, MD  ranitidine (ZANTAC) 150 MG tablet TK 1 T PO BID 04/22/15   Historical Provider, MD   BP 166/90 mmHg  Pulse 103  Temp(Src) 98.1 F (36.7 C) (Oral)  Resp 14  Ht 5\' 2"  (1.575 m)  Wt 71.668 kg  BMI 28.89 kg/m2  SpO2 100% Physical Exam  Constitutional: She is oriented to person, place, and time. She appears well-developed and well-nourished.  HENT:  Head: Normocephalic and atraumatic.  Eyes:  Sclera clear Pupils normal  Cardiovascular: Normal rate.   Pulmonary/Chest: Effort normal.  Abdominal: She exhibits no distension.  Neurological: She is alert and oriented to person, place, and time.  No pronator drift   Skin: Skin is warm and dry.  Psychiatric: She has a normal mood and affect.  Nursing note and vitals reviewed.   ED Course  Procedures (including critical care time)  DIAGNOSTIC STUDIES: Oxygen Saturation is 100% on room air, normal by my interpretation.    COORDINATION OF CARE: 11:24 AM Discussed treatment plan with pt at bedside and pt agreed to plan.   Labs Review Labs Reviewed  COMPREHENSIVE METABOLIC PANEL - Abnormal; Notable for the following:    Glucose, Bld 108 (*)    All other components within normal limits  URINE RAPID DRUG SCREEN, HOSP PERFORMED - Abnormal; Notable for the following:    Amphetamines POSITIVE (*)     All other components within normal limits  URINALYSIS, ROUTINE W REFLEX MICROSCOPIC (NOT AT [facility]) - Abnormal; Notable for the following:    APPearance CLOUDY (*)    All other components within normal limits  I-STAT CHEM 8, ED - Abnormal; Notable for the following:    Glucose, Bld 100 (*)    All other components within normal limits  ETHANOL  PROTIME-INR  APTT  CBC  DIFFERENTIAL  I-STAT TROPOININ, ED   Results for orders placed or performed during the hospital encounter of 08/09/15  Ethanol  Result Value Ref Range   Alcohol, Ethyl (B) <5 <5 mg/dL  Protime-INR  Result Value Ref Range   Prothrombin Time 13.2 11.6 - 15.2 seconds   INR 0.98 0.00 - 1.49  APTT  Result Value Ref Range   aPTT 28 24 - 37 seconds  CBC  Result Value Ref Range   WBC 5.0 4.0 - 10.5 K/uL   RBC 4.76 3.87 - 5.11 MIL/uL   Hemoglobin 13.2 12.0 - 15.0 g/dL   HCT 40.1 36.0 - 46.0 %   MCV 84.2 78.0 - 100.0 fL   MCH 27.7 26.0 - 34.0 pg   MCHC 32.9 30.0 - 36.0 g/dL   RDW 13.5 11.5 - 15.5 %   Platelets 315 150 - 400 K/uL  Differential  Result Value Ref Range   Neutrophils Relative % 65 %   Neutro Abs 3.2 1.7 - 7.7 K/uL   Lymphocytes Relative 26 %   Lymphs Abs 1.3 0.7 - 4.0 K/uL   Monocytes Relative 8 %   Monocytes Absolute 0.4 0.1 - 1.0 K/uL   Eosinophils Relative 1 %   Eosinophils Absolute 0.1 0.0 - 0.7 K/uL   Basophils Relative 0 %   Basophils Absolute 0.0 0.0 - 0.1 K/uL  Comprehensive metabolic panel  Result Value Ref Range   Sodium 140 135 - 145 mmol/L   Potassium 3.9 3.5 - 5.1 mmol/L   Chloride 102 101 - 111 mmol/L   CO2 28 22 - 32 mmol/L   Glucose, Bld 108 (H) 65 - 99 mg/dL   BUN 15  6 - 20 mg/dL   Creatinine, Ser 0.76 0.44 - 1.00 mg/dL   Calcium 9.6 8.9 - 10.3 mg/dL   Total Protein 7.6 6.5 - 8.1 g/dL   Albumin 4.2 3.5 - 5.0 g/dL   AST 15 15 - 41 U/L   ALT 14 14 - 54 U/L   Alkaline Phosphatase 121 38 - 126 U/L   Total Bilirubin 0.4 0.3 - 1.2 mg/dL   GFR calc non Af Amer >60 >60 mL/min    GFR calc Af Amer >60 >60 mL/min   Anion gap 10 5 - 15  Urine rapid drug screen (hosp performed)not at [facility]  Result Value Ref Range   Opiates NONE DETECTED NONE DETECTED   Cocaine NONE DETECTED NONE DETECTED   Benzodiazepines NONE DETECTED NONE DETECTED   Amphetamines POSITIVE (A) NONE DETECTED   Tetrahydrocannabinol NONE DETECTED NONE DETECTED   Barbiturates NONE DETECTED NONE DETECTED  Urinalysis, Routine w reflex microscopic (not at [facility])  Result Value Ref Range   Color, Urine YELLOW YELLOW   APPearance CLOUDY (A) CLEAR   Specific Gravity, Urine 1.020 1.005 - 1.030   pH 8.0 5.0 - 8.0   Glucose, UA NEGATIVE NEGATIVE mg/dL   Hgb urine dipstick NEGATIVE NEGATIVE   Bilirubin Urine NEGATIVE NEGATIVE   Ketones, ur NEGATIVE NEGATIVE mg/dL   Protein, ur NEGATIVE NEGATIVE mg/dL   Nitrite NEGATIVE NEGATIVE   Leukocytes, UA NEGATIVE NEGATIVE  I-Stat Chem 8, ED  (not at [facility])  Result Value Ref Range   Sodium 141 135 - 145 mmol/L   Potassium 4.5 3.5 - 5.1 mmol/L   Chloride 102 101 - 111 mmol/L   BUN 18 6 - 20 mg/dL   Creatinine, Ser 0.70 0.44 - 1.00 mg/dL   Glucose, Bld 100 (H) 65 - 99 mg/dL   Calcium, Ion 1.17 1.12 - 1.23 mmol/L   TCO2 30 0 - 100 mmol/L   Hemoglobin 14.6 12.0 - 15.0 g/dL   HCT 43.0 36.0 - 46.0 %  I-stat troponin, ED (not at [facility])  Result Value Ref Range   Troponin i, poc 0.00 0.00 - 0.08 ng/mL   Comment 3             Imaging Review Ct Head Wo Contrast  08/09/2015  CLINICAL DATA:  Slurred speech with dizziness and blurred vision EXAM: CT HEAD WITHOUT CONTRAST TECHNIQUE: Contiguous axial images were obtained from the base of the skull through the vertex without intravenous contrast. COMPARISON:  None. FINDINGS: The ventricles are normal in size and configuration. There is mild cerebellar atrophy. There is no intracranial mass, hemorrhage, extra-axial fluid collection, or midline shift. The gray-white compartments appear normal. No acute infarct evident. The  middle cerebral artery attenuation is symmetric and normal bilaterally. The bony calvarium appears intact. The mastoid air cells are clear. No intraorbital lesions are identified. IMPRESSION: Mild cerebellar atrophy. No intracranial mass, hemorrhage, or edema. No acute infarct evident. Critical Value/emergent results were called by telephone at the time of interpretation on 08/09/2015 at 11:51 am to Dr. Fredia Sorrow , who verbally acknowledged these results. Electronically Signed   By: Lowella Grip III M.D.   On: 08/09/2015 11:51   Ct Angio Neck W/cm &/or Wo/cm  08/09/2015  CLINICAL DATA:  59 year old female with right side double vision and dizziness since 0830 hours. Code stroke. Initial encounter. EXAM: CT ANGIOGRAPHY HEAD AND NECK TECHNIQUE: Multidetector CT imaging of the head and neck was performed using the standard protocol during bolus administration of intravenous  contrast. Multiplanar CT image reconstructions and MIPs were obtained to evaluate the vascular anatomy. Carotid stenosis measurements (when applicable) are obtained utilizing NASCET criteria, using the distal internal carotid diameter as the denominator. CONTRAST:  189mL OMNIPAQUE IOHEXOL 350 MG/ML SOLN, 170mL OMNIPAQUE IOHEXOL 350 MG/ML SOLN COMPARISON:  Head CT without contrast 1133 hours today. Cervical spine MRI 03/19/2004. FINDINGS: CTA NECK Skeleton: Previous C6-C7 ACDF. Degenerative osseous changes throughout the cervical spine. No acute osseous abnormality identified. Visualized paranasal sinuses and mastoids are clear. Other neck: Cardiomegaly. No pericardial effusion. Mild dependent pulmonary atelectasis. No mediastinal lymphadenopathy. Thyroid, larynx, pharynx, parapharyngeal spaces, retropharyngeal space (retropharyngeal course of both carotids), sublingual space, submandibular glands, parotid glands, and orbits soft tissues are within normal limits. No cervical lymphadenopathy. Aortic arch: 3 vessel arch configuration.  Minimal arch atherosclerosis. No great vessel origin stenosis. Right carotid system: Tortuous brachiocephalic artery. Normal right CCA origin. Tortuous proximal right CCA. Mild calcified plaque at the medial right carotid bifurcation. No right ICA origin or bulb stenosis. Partially retropharyngeal course of the cervical right ICA which otherwise appears normal to the skullbase. Left carotid system: Normal left CCA origin. Tortuous proximal left CCA with a an acutely kinked appearance (series 4, image 63). Negative left carotid bifurcation. Retropharyngeal course of the proximal left ICA. No left ICA atherosclerosis or stenosis in the neck. Vertebral arteries:No proximal right subclavian artery stenosis. Normal right vertebral artery origin (series 610, image 55). No right vertebral artery stenosis identified to the skullbase. No proximal left subclavian stenosis. Tortuous proximal left subclavian at the thoracic inlet. Normal left vertebral artery origin. Tortuous left V1 segment. No cervical left vertebral artery stenosis. CTA HEAD Posterior circulation: Codominant distal vertebral arteries are patent without stenosis. PICA origins and vertebrobasilar junction are patent. No basilar artery stenosis. SCA and PCA origins are normal. Posterior communicating arteries are diminutive or absent. Bilateral PCA branches are within normal limits. Anterior circulation: Both ICA siphons are patent. Calcified cavernous segment plaque greater on the left. No hemodynamically significant siphon stenosis. Ophthalmic artery origins are within normal limits. Carotid termini carotid termini are patent. Dominant right ACA A1 segment, the left is diminutive. Anterior communicating artery and bilateral ACA branches are within normal limits. Bilateral MCA M1 segments are patent. Left M1 segment, left MCA bifurcation, and left MCA branches are within normal limits. Right M1 segment, MCA bifurcation, and right MCA branches are within normal  limits. Venous sinuses: Patent. Anatomic variants: Dominant right ACA A1 segment. Delayed phase: No abnormal enhancement identified. No cortically based acute infarct identified. IMPRESSION: 1. Negative intracranial CTA, with no emergent large vessel occlusion. 2. Very mild carotid atherosclerosis at the right carotid bifurcation and in both ICA siphons. No associated stenosis. 3. No vertebral artery stenosis. 4. Stable and negative CT appearance of the brain. 5. Cardiomegaly.  Cervical spine degeneration, previous C6-C7 ACDF. Electronically Signed   By: Genevie Ann M.D.   On: 08/09/2015 15:13   I have personally reviewed and evaluated these images and lab results as part of my medical decision-making.   EKG Interpretation   Date/Time:  Saturday August 09 2015 11:22:59 EST Ventricular Rate:  107 PR Interval:  145 QRS Duration: 118 QT Interval:  343 QTC Calculation: 458 R Axis:   28 Text Interpretation:  Sinus tachycardia Nonspecific intraventricular  conduction delay Minimal ST elevation, inferior leads No significant  change since last tracing Confirmed by Patrecia Veiga  MD, Savon Cobbs [id number]) on  08/09/2015 12:21:06 PM      CRITICAL CARE Performed by: Fredia Sorrow  Total critical care time: 30 minutes Critical care time was exclusive of separately billable procedures and treating other patients. Critical care was necessary to treat or prevent imminent or life-threatening deterioration. Critical care was time spent personally by me on the following activities: development of treatment plan with patient and/or surrogate as well as nursing, discussions with consultants, evaluation of patient's response to treatment, examination of patient, obtaining history from patient or surrogate, ordering and performing treatments and interventions, ordering and review of laboratory studies, ordering and review of radiographic studies, pulse oximetry and re-evaluation of patient's condition.     MDM    Final diagnoses:  CVA (cerebral infarction)   Patient presented with concerns for acute stroke. History of varied initially patient stated symptoms started at 8:30 AM when she woke up she went to bed around 9:30 in the evening without any symptoms complained of double vision slurred speech per family trouble while walking feeling off balance. Also complaint of dizziness. When questioning further patient changed her story and stated that she got up at 8:30 was in the shower rate 45 and that's when the symptoms started. A code stroke was called. Head CT showed no acute infarcts however as per neurologist does show evidence of the infarct that could explain some of the left visual field deficits we thought she had. However with further questioning family members felt that before patient got in the shower she was having difficulty walking and was stumbling around. And they felt that her speech was abnormal. Based on this patient was not a candidate for TPA. MRI not available today so neurologist recommended CT angios.  If CT angios negative patient can be admitted here for probable stroke. Patient's labs without any significant abnormalities. Blood pressure reasonable at 156/76.  CT angios neck and head without any acute findings. Patient followed by Dr. Willey Blade will call hospitalist for admission.  I personally performed the services described in this documentation, which was scribed in my presence. The recorded information has been reviewed and is accurate.      Fredia Sorrow, MD 08/09/15 1500  Fredia Sorrow, MD 08/09/15 [phone]

## 2015-08-09 NOTE — ED Notes (Signed)
PT c/o waking this am c/o dizziness and double vision to bilateral eyes around 0845 this am.

## 2015-08-09 NOTE — ED Notes (Signed)
Tele-Neuro Machine taken from 300 by [facility] for consult of Pt.

## 2015-08-10 ENCOUNTER — Inpatient Hospital Stay (HOSPITAL_COMMUNITY): Payer: Medicare Other

## 2015-08-10 DIAGNOSIS — I639 Cerebral infarction, unspecified: Secondary | ICD-10-CM

## 2015-08-10 DIAGNOSIS — I10 Essential (primary) hypertension: Secondary | ICD-10-CM

## 2015-08-10 LAB — LIPID PANEL
CHOL/HDL RATIO: 3 ratio
CHOLESTEROL: 159 mg/dL (ref 0–200)
HDL: 53 mg/dL (ref >40)
LDL CALC: 68 mg/dL (ref 0–99)
TRIGLYCERIDES: 188 mg/dL — AB (ref <150)
VLDL: 38 mg/dL (ref 0–40)

## 2015-08-10 LAB — GLUCOSE, CAPILLARY
GLUCOSE-CAPILLARY: 81 mg/dL (ref 65–99)
GLUCOSE-CAPILLARY: 84 mg/dL (ref 65–99)
Glucose-Capillary: 79 mg/dL (ref 65–99)
Glucose-Capillary: 86 mg/dL (ref 65–99)

## 2015-08-10 LAB — TROPONIN I
Troponin I: <0.03 ng/mL (ref <0.031)
Troponin I: <0.03 ng/mL (ref <0.031)
Troponin I: <0.03 ng/mL (ref <0.031)

## 2015-08-10 MED ORDER — ALUM & MAG HYDROXIDE-SIMETH 200-200-20 MG/5ML PO SUSP
15.0000 mL | Freq: Once | ORAL | Status: AC
  Administered 2015-08-10: 15 mL via ORAL
  Filled 2015-08-10: qty 30

## 2015-08-10 MED ORDER — ASPIRIN 325 MG PO TABS
325.0000 mg | ORAL_TABLET | Freq: Every day | ORAL | Status: DC
  Administered 2015-08-10 – 2015-08-11 (×2): 325 mg via ORAL
  Filled 2015-08-10 (×2): qty 1

## 2015-08-10 NOTE — Progress Notes (Signed)
Subjective: She was admitted yesterday with presumed stroke. She is still complaining of some double vision. Her speech is better. CT was okay.  Objective: Vital signs in last 24 hours: Temp:  [97.6 F (36.4 C)-98.8 F (37.1 C)] 98.6 F (37 C) (12/11 0630) Pulse Rate:  [26-112] 86 (12/11 0630) Resp:  [14-21] 16 (12/10 2030) BP: (133-169)/(62-98) 133/71 mmHg (12/11 0630) SpO2:  [71 %-100 %] 96 % (12/11 0630) Weight:  [71.668 kg (158 lb)] 71.668 kg (158 lb) (12/10 1117) Weight change:  Last BM Date:  (last week )  Intake/Output from previous day: 12/10 0701 - 12/11 0700 In: 0  Out: 1 [Urine:1]  PHYSICAL EXAM General appearance: alert, cooperative, no distress and Neurologically grossly intact Resp: clear to auscultation bilaterally Cardio: regular rate and rhythm, S1, S2 normal, no murmur, click, rub or gallop GI: soft, non-tender; bowel sounds normal; no masses,  no organomegaly Extremities: extremities normal, atraumatic, no cyanosis or edema  Lab Results:  Results for orders placed or performed during the hospital encounter of 08/09/15 (from the past 48 hour(s))  Ethanol     Status: None   Collection Time: 08/09/15 11:20 AM  Result Value Ref Range   Alcohol, Ethyl (B) <5 <5 mg/dL    Comment:        LOWEST DETECTABLE LIMIT FOR SERUM ALCOHOL IS 5 mg/dL FOR MEDICAL PURPOSES ONLY   Protime-INR     Status: None   Collection Time: 08/09/15 11:20 AM  Result Value Ref Range   Prothrombin Time 13.2 11.6 - 15.2 seconds   INR 0.98 0.00 - 1.49  APTT     Status: None   Collection Time: 08/09/15 11:20 AM  Result Value Ref Range   aPTT 28 24 - 37 seconds  CBC     Status: None   Collection Time: 08/09/15 11:20 AM  Result Value Ref Range   WBC 5.0 4.0 - 10.5 K/uL   RBC 4.76 3.87 - 5.11 MIL/uL   Hemoglobin 13.2 12.0 - 15.0 g/dL   HCT 40.1 36.0 - 46.0 %   MCV 84.2 78.0 - 100.0 fL   MCH 27.7 26.0 - 34.0 pg   MCHC 32.9 30.0 - 36.0 g/dL   RDW 13.5 11.5 - 15.5 %   Platelets 315  150 - 400 K/uL  Differential     Status: None   Collection Time: 08/09/15 11:20 AM  Result Value Ref Range   Neutrophils Relative % 65 %   Neutro Abs 3.2 1.7 - 7.7 K/uL   Lymphocytes Relative 26 %   Lymphs Abs 1.3 0.7 - 4.0 K/uL   Monocytes Relative 8 %   Monocytes Absolute 0.4 0.1 - 1.0 K/uL   Eosinophils Relative 1 %   Eosinophils Absolute 0.1 0.0 - 0.7 K/uL   Basophils Relative 0 %   Basophils Absolute 0.0 0.0 - 0.1 K/uL  Comprehensive metabolic panel     Status: Abnormal   Collection Time: 08/09/15 11:20 AM  Result Value Ref Range   Sodium 140 135 - 145 mmol/L   Potassium 3.9 3.5 - 5.1 mmol/L   Chloride 102 101 - 111 mmol/L   CO2 28 22 - 32 mmol/L   Glucose, Bld 108 (H) 65 - 99 mg/dL   BUN 15 6 - 20 mg/dL   Creatinine, Ser 0.76 0.44 - 1.00 mg/dL   Calcium 9.6 8.9 - 10.3 mg/dL   Total Protein 7.6 6.5 - 8.1 g/dL   Albumin 4.2 3.5 - 5.0 g/dL   AST 15  15 - 41 U/L   ALT 14 14 - 54 U/L   Alkaline Phosphatase 121 38 - 126 U/L   Total Bilirubin 0.4 0.3 - 1.2 mg/dL   GFR calc non Af Amer >60 >60 mL/min   GFR calc Af Amer >60 >60 mL/min    Comment: (NOTE) The eGFR has been calculated using the CKD EPI equation. This calculation has not been validated in all clinical situations. eGFR's persistently <60 mL/min signify possible Chronic Kidney Disease.    Anion gap 10 5 - 15  I-stat troponin, ED (not at [facility], [facility])     Status: None   Collection Time: 08/09/15 11:59 AM  Result Value Ref Range   Troponin i, poc 0.00 0.00 - 0.08 ng/mL   Comment 3            Comment: Due to the release kinetics of cTnI, a negative result within the first hours of the onset of symptoms does not rule out myocardial infarction with certainty. If myocardial infarction is still suspected, repeat the test at appropriate intervals.   I-Stat Chem 8, ED  (not at [facility], [facility])     Status: Abnormal   Collection Time: 08/09/15 12:01 PM  Result Value Ref Range   Sodium 141 135 - 145 mmol/L   Potassium 4.5  3.5 - 5.1 mmol/L   Chloride 102 101 - 111 mmol/L   BUN 18 6 - 20 mg/dL   Creatinine, Ser 0.70 0.44 - 1.00 mg/dL   Glucose, Bld 100 (H) 65 - 99 mg/dL   Calcium, Ion 1.17 1.12 - 1.23 mmol/L   TCO2 30 0 - 100 mmol/L   Hemoglobin 14.6 12.0 - 15.0 g/dL   HCT 43.0 36.0 - 46.0 %  Urine rapid drug screen (hosp performed)not at [facility]     Status: Abnormal   Collection Time: 08/09/15  1:16 PM  Result Value Ref Range   Opiates NONE DETECTED NONE DETECTED   Cocaine NONE DETECTED NONE DETECTED   Benzodiazepines NONE DETECTED NONE DETECTED   Amphetamines POSITIVE (A) NONE DETECTED   Tetrahydrocannabinol NONE DETECTED NONE DETECTED   Barbiturates NONE DETECTED NONE DETECTED    Comment:        DRUG SCREEN FOR MEDICAL PURPOSES ONLY.  IF CONFIRMATION IS NEEDED FOR ANY PURPOSE, NOTIFY LAB WITHIN 5 DAYS.        LOWEST DETECTABLE LIMITS FOR URINE DRUG SCREEN Drug Class       Cutoff (ng/mL) Amphetamine      1000 Barbiturate      200 Benzodiazepine   453 Tricyclics       646 Opiates          300 Cocaine          300 THC              50   Urinalysis, Routine w reflex microscopic (not at [facility])     Status: Abnormal   Collection Time: 08/09/15  1:16 PM  Result Value Ref Range   Color, Urine YELLOW YELLOW   APPearance CLOUDY (A) CLEAR   Specific Gravity, Urine 1.020 1.005 - 1.030   pH 8.0 5.0 - 8.0   Glucose, UA NEGATIVE NEGATIVE mg/dL   Hgb urine dipstick NEGATIVE NEGATIVE   Bilirubin Urine NEGATIVE NEGATIVE   Ketones, ur NEGATIVE NEGATIVE mg/dL   Protein, ur NEGATIVE NEGATIVE mg/dL   Nitrite NEGATIVE NEGATIVE   Leukocytes, UA NEGATIVE NEGATIVE    Comment: MICROSCOPIC NOT DONE ON URINES WITH NEGATIVE PROTEIN, BLOOD, LEUKOCYTES,  NITRITE, OR GLUCOSE <1000 mg/dL.  Glucose, capillary     Status: None   Collection Time: 08/09/15  9:33 PM  Result Value Ref Range   Glucose-Capillary 88 65 - 99 mg/dL   Comment 1 Notify RN    Comment 2 Document in Chart   Troponin I (q 6hr x 3)     Status: None    Collection Time: 08/10/15  1:23 AM  Result Value Ref Range   Troponin I <0.03 <0.031 ng/mL    Comment:        NO INDICATION OF MYOCARDIAL INJURY.   Lipid panel     Status: Abnormal   Collection Time: 08/10/15  6:21 AM  Result Value Ref Range   Cholesterol 159 0 - 200 mg/dL   Triglycerides 188 (H) <150 mg/dL   HDL 53 >40 mg/dL   Total CHOL/HDL Ratio 3.0 RATIO   VLDL 38 0 - 40 mg/dL   LDL Cholesterol 68 0 - 99 mg/dL    Comment:        Total Cholesterol/HDL:CHD Risk Coronary Heart Disease Risk Table                     Men   Women  1/2 Average Risk   3.4   3.3  Average Risk       5.0   4.4  2 X Average Risk   9.6   7.1  3 X Average Risk  23.4   11.0        Use the calculated Patient Ratio above and the CHD Risk Table to determine the patient's CHD Risk.        ATP III CLASSIFICATION (LDL):  <100     mg/dL   Optimal  100-129  mg/dL   Near or Above                    Optimal  130-159  mg/dL   Borderline  160-189  mg/dL   High  >190     mg/dL   Very High   Troponin I (q 6hr x 3)     Status: None   Collection Time: 08/10/15  6:21 AM  Result Value Ref Range   Troponin I <0.03 <0.031 ng/mL    Comment:        NO INDICATION OF MYOCARDIAL INJURY.   Glucose, capillary     Status: None   Collection Time: 08/10/15  7:38 AM  Result Value Ref Range   Glucose-Capillary 79 65 - 99 mg/dL   Comment 1 Notify RN    Comment 2 Document in Chart     ABGS  Recent Labs  08/09/15 1201  TCO2 30   CULTURES No results found for this or any previous visit (from the past 240 hour(s)). Studies/Results: Ct Angio Head W/cm &/or Wo Cm  08/09/2015  CLINICAL DATA:  59 year old female with right side double vision and dizziness since 0830 hours. Code stroke. Initial encounter. EXAM: CT ANGIOGRAPHY HEAD AND NECK TECHNIQUE: Multidetector CT imaging of the head and neck was performed using the standard protocol during bolus administration of intravenous contrast. Multiplanar CT image  reconstructions and MIPs were obtained to evaluate the vascular anatomy. Carotid stenosis measurements (when applicable) are obtained utilizing NASCET criteria, using the distal internal carotid diameter as the denominator. CONTRAST:  172mL OMNIPAQUE IOHEXOL 350 MG/ML SOLN, 128mL OMNIPAQUE IOHEXOL 350 MG/ML SOLN COMPARISON:  Head CT without contrast 1133 hours today. Cervical spine MRI 03/19/2004. FINDINGS: CTA NECK  Skeleton: Previous C6-C7 ACDF. Degenerative osseous changes throughout the cervical spine. No acute osseous abnormality identified. Visualized paranasal sinuses and mastoids are clear. Other neck: Cardiomegaly. No pericardial effusion. Mild dependent pulmonary atelectasis. No mediastinal lymphadenopathy. Thyroid, larynx, pharynx, parapharyngeal spaces, retropharyngeal space (retropharyngeal course of both carotids), sublingual space, submandibular glands, parotid glands, and orbits soft tissues are within normal limits. No cervical lymphadenopathy. Aortic arch: 3 vessel arch configuration. Minimal arch atherosclerosis. No great vessel origin stenosis. Right carotid system: Tortuous brachiocephalic artery. Normal right CCA origin. Tortuous proximal right CCA. Mild calcified plaque at the medial right carotid bifurcation. No right ICA origin or bulb stenosis. Partially retropharyngeal course of the cervical right ICA which otherwise appears normal to the skullbase. Left carotid system: Normal left CCA origin. Tortuous proximal left CCA with a an acutely kinked appearance (series 4, image 63). Negative left carotid bifurcation. Retropharyngeal course of the proximal left ICA. No left ICA atherosclerosis or stenosis in the neck. Vertebral arteries:No proximal right subclavian artery stenosis. Normal right vertebral artery origin (series 610, image 55). No right vertebral artery stenosis identified to the skullbase. No proximal left subclavian stenosis. Tortuous proximal left subclavian at the thoracic inlet.  Normal left vertebral artery origin. Tortuous left V1 segment. No cervical left vertebral artery stenosis. CTA HEAD Posterior circulation: Codominant distal vertebral arteries are patent without stenosis. PICA origins and vertebrobasilar junction are patent. No basilar artery stenosis. SCA and PCA origins are normal. Posterior communicating arteries are diminutive or absent. Bilateral PCA branches are within normal limits. Anterior circulation: Both ICA siphons are patent. Calcified cavernous segment plaque greater on the left. No hemodynamically significant siphon stenosis. Ophthalmic artery origins are within normal limits. Carotid termini carotid termini are patent. Dominant right ACA A1 segment, the left is diminutive. Anterior communicating artery and bilateral ACA branches are within normal limits. Bilateral MCA M1 segments are patent. Left M1 segment, left MCA bifurcation, and left MCA branches are within normal limits. Right M1 segment, MCA bifurcation, and right MCA branches are within normal limits. Venous sinuses: Patent. Anatomic variants: Dominant right ACA A1 segment. Delayed phase: No abnormal enhancement identified. No cortically based acute infarct identified. IMPRESSION: 1. Negative intracranial CTA, with no emergent large vessel occlusion. 2. Very mild carotid atherosclerosis at the right carotid bifurcation and in both ICA siphons. No associated stenosis. 3. No vertebral artery stenosis. 4. Stable and negative CT appearance of the brain. 5. Cardiomegaly.  Cervical spine degeneration, previous C6-C7 ACDF. Electronically Signed   By: Genevie Ann M.D.   On: 08/09/2015 15:13   Ct Head Wo Contrast  08/09/2015  CLINICAL DATA:  Slurred speech with dizziness and blurred vision EXAM: CT HEAD WITHOUT CONTRAST TECHNIQUE: Contiguous axial images were obtained from the base of the skull through the vertex without intravenous contrast. COMPARISON:  None. FINDINGS: The ventricles are normal in size and  configuration. There is mild cerebellar atrophy. There is no intracranial mass, hemorrhage, extra-axial fluid collection, or midline shift. The gray-white compartments appear normal. No acute infarct evident. The middle cerebral artery attenuation is symmetric and normal bilaterally. The bony calvarium appears intact. The mastoid air cells are clear. No intraorbital lesions are identified. IMPRESSION: Mild cerebellar atrophy. No intracranial mass, hemorrhage, or edema. No acute infarct evident. Critical Value/emergent results were called by telephone at the time of interpretation on 08/09/2015 at 11:51 am to Dr. Fredia Sorrow , who verbally acknowledged these results. Electronically Signed   By: Lowella Grip III M.D.   On: 08/09/2015 11:51  Ct Angio Neck W/cm &/or Wo/cm  08/09/2015  CLINICAL DATA:  59 year old female with right side double vision and dizziness since 0830 hours. Code stroke. Initial encounter. EXAM: CT ANGIOGRAPHY HEAD AND NECK TECHNIQUE: Multidetector CT imaging of the head and neck was performed using the standard protocol during bolus administration of intravenous contrast. Multiplanar CT image reconstructions and MIPs were obtained to evaluate the vascular anatomy. Carotid stenosis measurements (when applicable) are obtained utilizing NASCET criteria, using the distal internal carotid diameter as the denominator. CONTRAST:  141mL OMNIPAQUE IOHEXOL 350 MG/ML SOLN, 129mL OMNIPAQUE IOHEXOL 350 MG/ML SOLN COMPARISON:  Head CT without contrast 1133 hours today. Cervical spine MRI 03/19/2004. FINDINGS: CTA NECK Skeleton: Previous C6-C7 ACDF. Degenerative osseous changes throughout the cervical spine. No acute osseous abnormality identified. Visualized paranasal sinuses and mastoids are clear. Other neck: Cardiomegaly. No pericardial effusion. Mild dependent pulmonary atelectasis. No mediastinal lymphadenopathy. Thyroid, larynx, pharynx, parapharyngeal spaces, retropharyngeal space  (retropharyngeal course of both carotids), sublingual space, submandibular glands, parotid glands, and orbits soft tissues are within normal limits. No cervical lymphadenopathy. Aortic arch: 3 vessel arch configuration. Minimal arch atherosclerosis. No great vessel origin stenosis. Right carotid system: Tortuous brachiocephalic artery. Normal right CCA origin. Tortuous proximal right CCA. Mild calcified plaque at the medial right carotid bifurcation. No right ICA origin or bulb stenosis. Partially retropharyngeal course of the cervical right ICA which otherwise appears normal to the skullbase. Left carotid system: Normal left CCA origin. Tortuous proximal left CCA with a an acutely kinked appearance (series 4, image 63). Negative left carotid bifurcation. Retropharyngeal course of the proximal left ICA. No left ICA atherosclerosis or stenosis in the neck. Vertebral arteries:No proximal right subclavian artery stenosis. Normal right vertebral artery origin (series 610, image 55). No right vertebral artery stenosis identified to the skullbase. No proximal left subclavian stenosis. Tortuous proximal left subclavian at the thoracic inlet. Normal left vertebral artery origin. Tortuous left V1 segment. No cervical left vertebral artery stenosis. CTA HEAD Posterior circulation: Codominant distal vertebral arteries are patent without stenosis. PICA origins and vertebrobasilar junction are patent. No basilar artery stenosis. SCA and PCA origins are normal. Posterior communicating arteries are diminutive or absent. Bilateral PCA branches are within normal limits. Anterior circulation: Both ICA siphons are patent. Calcified cavernous segment plaque greater on the left. No hemodynamically significant siphon stenosis. Ophthalmic artery origins are within normal limits. Carotid termini carotid termini are patent. Dominant right ACA A1 segment, the left is diminutive. Anterior communicating artery and bilateral ACA branches are  within normal limits. Bilateral MCA M1 segments are patent. Left M1 segment, left MCA bifurcation, and left MCA branches are within normal limits. Right M1 segment, MCA bifurcation, and right MCA branches are within normal limits. Venous sinuses: Patent. Anatomic variants: Dominant right ACA A1 segment. Delayed phase: No abnormal enhancement identified. No cortically based acute infarct identified. IMPRESSION: 1. Negative intracranial CTA, with no emergent large vessel occlusion. 2. Very mild carotid atherosclerosis at the right carotid bifurcation and in both ICA siphons. No associated stenosis. 3. No vertebral artery stenosis. 4. Stable and negative CT appearance of the brain. 5. Cardiomegaly.  Cervical spine degeneration, previous C6-C7 ACDF. Electronically Signed   By: Genevie Ann M.D.   On: 08/09/2015 15:13    Medications:  Prior to Admission:  Prescriptions prior to admission  Medication Sig Dispense Refill Last Dose  . amLODipine-benazepril (LOTREL) 5-20 MG per capsule Take 2 capsules by mouth daily.   08/09/2015 at Unknown time  . amphetamine-dextroamphetamine (ADDERALL) 20 MG  tablet Take 20 mg by mouth daily.   0 08/09/2015 at Unknown time  . aspirin EC 81 MG tablet Take 81 mg by mouth daily.   08/09/2015 at Unknown time  . atorvastatin (LIPITOR) 10 MG tablet Take 10 mg by mouth at bedtime.   08/08/2015 at Unknown time  . Bisacodyl (DULCOLAX PO) Take by mouth as needed.   08/08/2015 at Unknown time  . carbamazepine (TEGRETOL XR) 200 MG 12 hr tablet Take 600 mg by mouth at bedtime.    08/08/2015 at 2130  . clonazePAM (KLONOPIN) 1 MG tablet Take 1 mg by mouth 4 (four) times daily as needed for anxiety.   08/09/2015 at Unknown time  . lamoTRIgine (LAMICTAL) 200 MG tablet Take 200 mg by mouth 2 (two) times daily.    08/09/2015 at 830a  . LATUDA 60 MG TABS Take 60 mg by mouth at bedtime.   0 08/08/2015 at Unknown time  . metFORMIN (GLUCOPHAGE-XR) 500 MG 24 hr tablet Take 500 mg by mouth 2 (two) times  daily.   08/09/2015 at Unknown time  . ondansetron (ZOFRAN) 4 MG tablet Take 1 tablet (4 mg total) by mouth every 8 (eight) hours as needed for nausea or vomiting. 30 tablet 1 Past Week at Unknown time  . OVER THE COUNTER MEDICATION Take by mouth at bedtime as needed (for sleep). OTC Zquill takes prn   Past Week at Unknown time  . oxybutynin (DITROPAN-XL) 10 MG 24 hr tablet Take 10 mg by mouth daily.    Past Week at Unknown time  . pantoprazole (PROTONIX) 40 MG tablet Take 1 tablet (40 mg total) by mouth daily before breakfast. 30 tablet 5 08/09/2015 at Unknown time  . polyethylene glycol (MIRALAX / GLYCOLAX) packet Take 17 g by mouth 2 (two) times daily.   Past Week at Unknown time  . ranitidine (ZANTAC) 150 MG tablet TAKE ONE TABLET BY MOUTH TWICE DAILY  8 08/09/2015 at Unknown time   Scheduled: . amLODipine  10 mg Oral Daily  . amphetamine-dextroamphetamine  20 mg Oral Daily  . aspirin  325 mg Oral Daily  . atorvastatin  10 mg Oral QHS  . benazepril  40 mg Oral Daily  . carbamazepine  600 mg Oral QHS  . enoxaparin (LOVENOX) injection  40 mg Subcutaneous Q24H  . famotidine  20 mg Oral Daily  . HYDROcodone-acetaminophen  1 tablet Oral Once  . insulin aspart  0-15 Units Subcutaneous TID WC  . insulin aspart  0-5 Units Subcutaneous QHS  . lamoTRIgine  200 mg Oral BID  . lurasidone  60 mg Oral QHS  . metFORMIN  500 mg Oral BID WC  . oxybutynin  10 mg Oral Daily  . pantoprazole  40 mg Oral QAC breakfast  . polyethylene glycol  17 g Oral BID   Continuous: . sodium chloride 75 mL/hr (08/10/15 0636)   VWP:[id number], ondansetron  Assesment: She had what appears to have been a CVA versus TIA. We are awaiting MRI scan. She will have PT and speech consultation. Neurology consultation. Her symptoms have largely resolved but she is still dizzy Active Problems:   Diabetes [facility])   Essential hypertension   Bipolar 1 disorder (HCC)   CVA (cerebral infarction)    Plan: Continue current  treatment.    LOS: 1 day   Derick Seminara L 08/10/2015, 10:25 AM

## 2015-08-10 NOTE — Evaluation (Signed)
Physical Therapy Evaluation Patient Details Name: TAGAN MCKINNEY MRN: [id number] DOB: 1956-06-20 Today's Date: 08/10/2015   History of Present Illness  Pt is a 59yo black female who reports becoming suddenly weak yesterday, with multiple collapses and double vision that was worse with R gaze. Pt denies vertiginous experience, recent medication changes, nausea, or vomitting. Pt does not think that her blood sugar was a factor due to her meal timing and content being consistent.  Pt denies any numbness or tingling anywhere. Family reports patient to having had some slurring of speech  but patient does not remember.  At evaluation, all symptoms are reportedly resolved.  eldest daughter is in room to attest history.   Clinical Impression  Pt currently being assessed for CVA via TIA, with suspected cerebellar involvement. Thus far, all imaging negative for acute infarc, however pt pending MRI. Pt is presenting at baseline at evaluation, with strength, balance, coordination, all unimpaired and at baseline. Pt ambulates very quickly, indep and safely, without LOB. Eye exam reveals oculomotor function WNL, smooth pursuits intact, no nystagmus identified. Finger to nose testing is WNL on the right, and minimally inaccurate on L, however only so much as to assume is typical with a non dominant UE. All fine and gross motor function is smooth and absent tremor. Pt presenting at baseline at this time. No skilled PT services warranted, and will be DC from PT. Pt is appropriate and safe for DC to home without any PT follow-up as soon as she is deemed medically fit for DC.      Follow Up Recommendations No PT follow up    Equipment Recommendations  None recommended by PT    Recommendations for Other Services       Precautions / Restrictions Precautions Precautions: None Restrictions Weight Bearing Restrictions: No      Mobility  Bed Mobility               General bed mobility comments:  Received in standning.   Transfers Overall transfer level: Independent                  Ambulation/Gait Ambulation/Gait assistance: Supervision Ambulation Distance (Feet): 200 Feet Assistive device: None Gait Pattern/deviations: WFL(Within Functional Limits) Gait velocity: Max gait speed 1.64m/s, no LOB, safe, symmetrical.  Gait velocity interpretation: at or above normal speed for age/gender    Stairs Stairs: Yes Stairs assistance: Min guard Stair Management: One rail Right Number of Stairs: 4    Wheelchair Mobility    Modified Rankin (Stroke Patients Only)       Balance Overall balance assessment: No apparent balance deficits (not formally assessed) (No LOB with sharpened Rhomberg, head turns, trunk turns. )                                           Pertinent Vitals/Pain Pain Assessment: No/denies pain    Home Living Family/patient expects to be discharged to:: Private residence Living Arrangements: Children (87yo daughter) Available Help at Discharge: Family Type of Home: Apartment Home Access: Stairs to enter Entrance Stairs-Rails: None Entrance Stairs-Number of Steps: 3 very deep steps Home Layout: One level Home Equipment: None      Prior Function Level of Independence: Independent         Comments: Still drives, does not work.      Hand Dominance        Extremity/Trunk  Assessment   Upper Extremity Assessment: Defer to OT evaluation (Grip strength equal and strong. Finger to nose is normal on R, slightly less accurate on L (non dominant), absent tremor.  )           Lower Extremity Assessment: Overall WFL for tasks assessed (Max gait speed: 1.53m/s independently)      Cervical / Trunk Assessment: Normal  Communication      Cognition Arousal/Alertness: Awake/alert Behavior During Therapy: WFL for tasks assessed/performed Overall Cognitive Status: Within Functional Limits for tasks assessed       Memory:  Decreased recall of precautions              General Comments      Exercises        Assessment/Plan    PT Assessment Patent does not need any further PT services  PT Diagnosis     PT Problem List    PT Treatment Interventions     PT Goals (Current goals can be found in the Care Plan section) Acute Rehab PT Goals PT Goal Formulation: All assessment and education complete, DC therapy    Frequency     Barriers to discharge        Co-evaluation               End of Session Equipment Utilized During Treatment: Gait belt Activity Tolerance: Patient tolerated treatment well;No increased pain Patient left: in bed;with call bell/phone within reach;with family/visitor present Nurse Communication: Other (comment)         [id number] PT Time Calculation (min) (ACUTE ONLY): 22 min   Charges:   PT Evaluation $Initial PT Evaluation Tier I: 1 Procedure     PT G Codes:        Aniqa Hare C 08/19/15, 1:14 PM  1:20 PM  Etta Grandchild, PT, DPT [place] License # [id number]

## 2015-08-11 ENCOUNTER — Inpatient Hospital Stay (HOSPITAL_COMMUNITY): Payer: Medicare Other

## 2015-08-11 LAB — HEMOGLOBIN A1C
Hgb A1c MFr Bld: 6.1 % — ABNORMAL HIGH (ref 4.8–5.6)
Mean Plasma Glucose: 128 mg/dL

## 2015-08-11 LAB — GLUCOSE, CAPILLARY: GLUCOSE-CAPILLARY: 96 mg/dL (ref 65–99)

## 2015-08-11 MED ORDER — CARBAMAZEPINE ER 400 MG PO TB12
600.0000 mg | ORAL_TABLET | Freq: Every day | ORAL | Status: DC
  Filled 2015-08-11: qty 1

## 2015-08-11 MED ORDER — ASPIRIN 325 MG PO TABS: 325.0000 mg | ORAL_TABLET | Freq: Every day | ORAL | Status: DC

## 2015-08-11 MED ORDER — STROKE: EARLY STAGES OF RECOVERY BOOK
Freq: Once | Status: DC
Start: 2015-08-11 — End: 2015-08-11
  Filled 2015-08-11: qty 1

## 2015-08-11 MED ORDER — LURASIDONE HCL 40 MG PO TABS
60.0000 mg | ORAL_TABLET | Freq: Every day | ORAL | Status: DC
  Filled 2015-08-11 (×2): qty 2

## 2015-08-11 NOTE — Care Management Note (Signed)
Case Management Note  Patient Details  Name: Linda English MRN: [id number] Date of Birth: 1956/04/04   Subjective/Objective:                  Pt admitted with TIA. Pt is from home, lives with her 58 y/o daughter. Pt ind with ADL's prior to admission. Pt with no physical deficits.   Action/Plan: Pt discharging home with self care today. No CM needs.   Expected Discharge Date:     08/11/2015             Expected Discharge Plan:  Home/Self Care  In-House Referral:  NA  Discharge planning Services  CM Consult  Post Acute Care Choice:  NA Choice offered to:  NA  DME Arranged:    DME Agency:     HH Arranged:    HH Agency:     Status of Service:  Completed, signed off  Medicare Important Message Given:  N/A - LOS <3 / Initial given by admissions Date Medicare IM Given:    Medicare IM give by:    Date Additional Medicare IM Given:    Additional Medicare Important Message give by:     If discussed at [place] of Stay Meetings, dates discussed:    Additional Comments:  Sherald Barge, RN 08/11/2015, 10:37 AM

## 2015-08-11 NOTE — Care Management Important Message (Signed)
Important Message  Patient Details  Name: Linda English MRN: [id number] Date of Birth: 08-Mar-1956   Medicare Important Message Given:  N/A - LOS <3 / Initial given by admissions    Sherald Barge, RN 08/11/2015, 10:37 AM

## 2015-08-11 NOTE — Care Management Important Message (Deleted)
Important Message  Patient Details  Name: Linda English MRN: [id number] Date of Birth: 05-May-1956   Medicare Important Message Given:  Yes    Sherald Barge, RN 08/11/2015, 10:36 AM

## 2015-08-11 NOTE — Progress Notes (Signed)
Subjective: She feels well with no complaints. No double vision. No speech problems. PT and OT notes are seen and appreciated. She had MRI this morning. This was negative  Objective: Vital signs in last 24 hours: Temp:  [97.7 F (36.5 C)-98.6 F (37 C)] 98.6 F (37 C) (12/12 0631) Pulse Rate:  [85-106] 85 (12/12 0631) Resp:  [18-20] 18 (12/12 0631) BP: (142-172)/(69-90) 172/80 mmHg (12/12 0631) SpO2:  [99 %-100 %] 100 % (12/12 0631) Weight change:  Last BM Date:  (prior to admit, per pt it's been a week ago)  Intake/Output from previous day: 12/11 0701 - 12/12 0700 In: 120 [P.O.:120] Out: -   PHYSICAL EXAM General appearance: alert, cooperative, no distress and Neurological examination is normal Resp: clear to auscultation bilaterally Cardio: regular rate and rhythm, S1, S2 normal, no murmur, click, rub or gallop GI: soft, non-tender; bowel sounds normal; no masses,  no organomegaly Extremities: extremities normal, atraumatic, no cyanosis or edema  Lab Results:  Results for orders placed or performed during the hospital encounter of 08/09/15 (from the past 48 hour(s))  Ethanol     Status: None   Collection Time: 08/09/15 11:20 AM  Result Value Ref Range   Alcohol, Ethyl (B) <5 <5 mg/dL    Comment:        LOWEST DETECTABLE LIMIT FOR SERUM ALCOHOL IS 5 mg/dL FOR MEDICAL PURPOSES ONLY   Protime-INR     Status: None   Collection Time: 08/09/15 11:20 AM  Result Value Ref Range   Prothrombin Time 13.2 11.6 - 15.2 seconds   INR 0.98 0.00 - 1.49  APTT     Status: None   Collection Time: 08/09/15 11:20 AM  Result Value Ref Range   aPTT 28 24 - 37 seconds  CBC     Status: None   Collection Time: 08/09/15 11:20 AM  Result Value Ref Range   WBC 5.0 4.0 - 10.5 K/uL   RBC 4.76 3.87 - 5.11 MIL/uL   Hemoglobin 13.2 12.0 - 15.0 g/dL   HCT 40.1 36.0 - 46.0 %   MCV 84.2 78.0 - 100.0 fL   MCH 27.7 26.0 - 34.0 pg   MCHC 32.9 30.0 - 36.0 g/dL   RDW 13.5 11.5 - 15.5 %   Platelets  315 150 - 400 K/uL  Differential     Status: None   Collection Time: 08/09/15 11:20 AM  Result Value Ref Range   Neutrophils Relative % 65 %   Neutro Abs 3.2 1.7 - 7.7 K/uL   Lymphocytes Relative 26 %   Lymphs Abs 1.3 0.7 - 4.0 K/uL   Monocytes Relative 8 %   Monocytes Absolute 0.4 0.1 - 1.0 K/uL   Eosinophils Relative 1 %   Eosinophils Absolute 0.1 0.0 - 0.7 K/uL   Basophils Relative 0 %   Basophils Absolute 0.0 0.0 - 0.1 K/uL  Comprehensive metabolic panel     Status: Abnormal   Collection Time: 08/09/15 11:20 AM  Result Value Ref Range   Sodium 140 135 - 145 mmol/English   Potassium 3.9 3.5 - 5.1 mmol/English   Chloride 102 101 - 111 mmol/English   CO2 28 22 - 32 mmol/English   Glucose, Bld 108 (H) 65 - 99 mg/dL   BUN 15 6 - 20 mg/dL   Creatinine, Ser 0.76 0.44 - 1.00 mg/dL   Calcium 9.6 8.9 - 10.3 mg/dL   Total Protein 7.6 6.5 - 8.1 g/dL   Albumin 4.2 3.5 - 5.0 g/dL  AST 15 15 - 41 U/English   ALT 14 14 - 54 U/English   Alkaline Phosphatase 121 38 - 126 U/English   Total Bilirubin 0.4 0.3 - 1.2 mg/dL   GFR calc non Af Amer >60 >60 mL/min   GFR calc Af Amer >60 >60 mL/min    Comment: (NOTE) The eGFR has been calculated using the CKD EPI equation. This calculation has not been validated in all clinical situations. eGFR's persistently <60 mL/min signify possible Chronic Kidney Disease.    Anion gap 10 5 - 15  I-stat troponin, ED (not at [facility], [facility])     Status: None   Collection Time: 08/09/15 11:59 AM  Result Value Ref Range   Troponin i, poc 0.00 0.00 - 0.08 ng/mL   Comment 3            Comment: Due to the release kinetics of cTnI, a negative result within the first hours of the onset of symptoms does not rule out myocardial infarction with certainty. If myocardial infarction is still suspected, repeat the test at appropriate intervals.   I-Stat Chem 8, ED  (not at [facility], [facility])     Status: Abnormal   Collection Time: 08/09/15 12:01 PM  Result Value Ref Range   Sodium 141 135 - 145 mmol/English   Potassium  4.5 3.5 - 5.1 mmol/English   Chloride 102 101 - 111 mmol/English   BUN 18 6 - 20 mg/dL   Creatinine, Ser 0.70 0.44 - 1.00 mg/dL   Glucose, Bld 100 (H) 65 - 99 mg/dL   Calcium, Ion 1.17 1.12 - 1.23 mmol/English   TCO2 30 0 - 100 mmol/English   Hemoglobin 14.6 12.0 - 15.0 g/dL   HCT 43.0 36.0 - 46.0 %  Urine rapid drug screen (hosp performed)not at [facility]     Status: Abnormal   Collection Time: 08/09/15  1:16 PM  Result Value Ref Range   Opiates NONE DETECTED NONE DETECTED   Cocaine NONE DETECTED NONE DETECTED   Benzodiazepines NONE DETECTED NONE DETECTED   Amphetamines POSITIVE (A) NONE DETECTED   Tetrahydrocannabinol NONE DETECTED NONE DETECTED   Barbiturates NONE DETECTED NONE DETECTED    Comment:        DRUG SCREEN FOR MEDICAL PURPOSES ONLY.  IF CONFIRMATION IS NEEDED FOR ANY PURPOSE, NOTIFY LAB WITHIN 5 DAYS.        LOWEST DETECTABLE LIMITS FOR URINE DRUG SCREEN Drug Class       Cutoff (ng/mL) Amphetamine      1000 Barbiturate      200 Benzodiazepine   161 Tricyclics       096 Opiates          300 Cocaine          300 THC              50   Urinalysis, Routine w reflex microscopic (not at [facility])     Status: Abnormal   Collection Time: 08/09/15  1:16 PM  Result Value Ref Range   Color, Urine YELLOW YELLOW   APPearance CLOUDY (A) CLEAR   Specific Gravity, Urine 1.020 1.005 - 1.030   pH 8.0 5.0 - 8.0   Glucose, UA NEGATIVE NEGATIVE mg/dL   Hgb urine dipstick NEGATIVE NEGATIVE   Bilirubin Urine NEGATIVE NEGATIVE   Ketones, ur NEGATIVE NEGATIVE mg/dL   Protein, ur NEGATIVE NEGATIVE mg/dL   Nitrite NEGATIVE NEGATIVE   Leukocytes, UA NEGATIVE NEGATIVE    Comment: MICROSCOPIC NOT DONE ON URINES WITH NEGATIVE PROTEIN,  BLOOD, LEUKOCYTES, NITRITE, OR GLUCOSE <1000 mg/dL.  Glucose, capillary     Status: None   Collection Time: 08/09/15  9:33 PM  Result Value Ref Range   Glucose-Capillary 88 65 - 99 mg/dL   Comment 1 Notify RN    Comment 2 Document in Chart   Troponin I (q 6hr x 3)     Status: None    Collection Time: 08/10/15  1:23 AM  Result Value Ref Range   Troponin I <0.03 <0.031 ng/mL    Comment:        NO INDICATION OF MYOCARDIAL INJURY.   Lipid panel     Status: Abnormal   Collection Time: 08/10/15  6:21 AM  Result Value Ref Range   Cholesterol 159 0 - 200 mg/dL   Triglycerides 188 (H) <150 mg/dL   HDL 53 >40 mg/dL   Total CHOL/HDL Ratio 3.0 RATIO   VLDL 38 0 - 40 mg/dL   LDL Cholesterol 68 0 - 99 mg/dL    Comment:        Total Cholesterol/HDL:CHD Risk Coronary Heart Disease Risk Table                     Men   Women  1/2 Average Risk   3.4   3.3  Average Risk       5.0   4.4  2 X Average Risk   9.6   7.1  3 X Average Risk  23.4   11.0        Use the calculated Patient Ratio above and the CHD Risk Table to determine the patient's CHD Risk.        ATP III CLASSIFICATION (LDL):  <100     mg/dL   Optimal  100-129  mg/dL   Near or Above                    Optimal  130-159  mg/dL   Borderline  160-189  mg/dL   High  >190     mg/dL   Very High   Troponin I (q 6hr x 3)     Status: None   Collection Time: 08/10/15  6:21 AM  Result Value Ref Range   Troponin I <0.03 <0.031 ng/mL    Comment:        NO INDICATION OF MYOCARDIAL INJURY.   Glucose, capillary     Status: None   Collection Time: 08/10/15  7:38 AM  Result Value Ref Range   Glucose-Capillary 79 65 - 99 mg/dL   Comment 1 Notify RN    Comment 2 Document in Chart   Glucose, capillary     Status: None   Collection Time: 08/10/15 11:23 AM  Result Value Ref Range   Glucose-Capillary 86 65 - 99 mg/dL   Comment 1 Notify RN    Comment 2 Document in Chart   Troponin I (q 6hr x 3)     Status: None   Collection Time: 08/10/15 12:11 PM  Result Value Ref Range   Troponin I <0.03 <0.031 ng/mL    Comment:        NO INDICATION OF MYOCARDIAL INJURY.   Glucose, capillary     Status: None   Collection Time: 08/10/15  4:42 PM  Result Value Ref Range   Glucose-Capillary 81 65 - 99 mg/dL   Comment 1 Notify  RN    Comment 2 Document in Chart   Glucose, capillary     Status: None  Collection Time: 08/10/15  8:27 PM  Result Value Ref Range   Glucose-Capillary 84 65 - 99 mg/dL  Glucose, capillary     Status: None   Collection Time: 08/11/15  8:11 AM  Result Value Ref Range   Glucose-Capillary 96 65 - 99 mg/dL   Comment 1 Notify RN    Comment 2 Document in Chart     ABGS  Recent Labs  08/09/15 1201  TCO2 30   CULTURES No results found for this or any previous visit (from the past 240 hour(s)). Studies/Results: Ct Angio Head W/cm &/or Wo Cm  08/09/2015  CLINICAL DATA:  59 year old female with right side double vision and dizziness since 0830 hours. Code stroke. Initial encounter. EXAM: CT ANGIOGRAPHY HEAD AND NECK TECHNIQUE: Multidetector CT imaging of the head and neck was performed using the standard protocol during bolus administration of intravenous contrast. Multiplanar CT image reconstructions and MIPs were obtained to evaluate the vascular anatomy. Carotid stenosis measurements (when applicable) are obtained utilizing NASCET criteria, using the distal internal carotid diameter as the denominator. CONTRAST:  12m OMNIPAQUE IOHEXOL 350 MG/ML SOLN, 1037mOMNIPAQUE IOHEXOL 350 MG/ML SOLN COMPARISON:  Head CT without contrast 1133 hours today. Cervical spine MRI 03/19/2004. FINDINGS: CTA NECK Skeleton: Previous C6-C7 ACDF. Degenerative osseous changes throughout the cervical spine. No acute osseous abnormality identified. Visualized paranasal sinuses and mastoids are clear. Other neck: Cardiomegaly. No pericardial effusion. Mild dependent pulmonary atelectasis. No mediastinal lymphadenopathy. Thyroid, larynx, pharynx, parapharyngeal spaces, retropharyngeal space (retropharyngeal course of both carotids), sublingual space, submandibular glands, parotid glands, and orbits soft tissues are within normal limits. No cervical lymphadenopathy. Aortic arch: 3 vessel arch configuration. Minimal arch  atherosclerosis. No great vessel origin stenosis. Right carotid system: Tortuous brachiocephalic artery. Normal right CCA origin. Tortuous proximal right CCA. Mild calcified plaque at the medial right carotid bifurcation. No right ICA origin or bulb stenosis. Partially retropharyngeal course of the cervical right ICA which otherwise appears normal to the skullbase. Left carotid system: Normal left CCA origin. Tortuous proximal left CCA with a an acutely kinked appearance (series 4, image 63). Negative left carotid bifurcation. Retropharyngeal course of the proximal left ICA. No left ICA atherosclerosis or stenosis in the neck. Vertebral arteries:No proximal right subclavian artery stenosis. Normal right vertebral artery origin (series 610, image 55). No right vertebral artery stenosis identified to the skullbase. No proximal left subclavian stenosis. Tortuous proximal left subclavian at the thoracic inlet. Normal left vertebral artery origin. Tortuous left V1 segment. No cervical left vertebral artery stenosis. CTA HEAD Posterior circulation: Codominant distal vertebral arteries are patent without stenosis. PICA origins and vertebrobasilar junction are patent. No basilar artery stenosis. SCA and PCA origins are normal. Posterior communicating arteries are diminutive or absent. Bilateral PCA branches are within normal limits. Anterior circulation: Both ICA siphons are patent. Calcified cavernous segment plaque greater on the left. No hemodynamically significant siphon stenosis. Ophthalmic artery origins are within normal limits. Carotid termini carotid termini are patent. Dominant right ACA A1 segment, the left is diminutive. Anterior communicating artery and bilateral ACA branches are within normal limits. Bilateral MCA M1 segments are patent. Left M1 segment, left MCA bifurcation, and left MCA branches are within normal limits. Right M1 segment, MCA bifurcation, and right MCA branches are within normal limits.  Venous sinuses: Patent. Anatomic variants: Dominant right ACA A1 segment. Delayed phase: No abnormal enhancement identified. No cortically based acute infarct identified. IMPRESSION: 1. Negative intracranial CTA, with no emergent large vessel occlusion. 2. Very mild carotid atherosclerosis  at the right carotid bifurcation and in both ICA siphons. No associated stenosis. 3. No vertebral artery stenosis. 4. Stable and negative CT appearance of the brain. 5. Cardiomegaly.  Cervical spine degeneration, previous C6-C7 ACDF. Electronically Signed   By: Genevie Ann M.D.   On: 08/09/2015 15:13   Ct Head Wo Contrast  08/09/2015  CLINICAL DATA:  Slurred speech with dizziness and blurred vision EXAM: CT HEAD WITHOUT CONTRAST TECHNIQUE: Contiguous axial images were obtained from the base of the skull through the vertex without intravenous contrast. COMPARISON:  None. FINDINGS: The ventricles are normal in size and configuration. There is mild cerebellar atrophy. There is no intracranial mass, hemorrhage, extra-axial fluid collection, or midline shift. The gray-white compartments appear normal. No acute infarct evident. The middle cerebral artery attenuation is symmetric and normal bilaterally. The bony calvarium appears intact. The mastoid air cells are clear. No intraorbital lesions are identified. IMPRESSION: Mild cerebellar atrophy. No intracranial mass, hemorrhage, or edema. No acute infarct evident. Critical Value/emergent results were called by telephone at the time of interpretation on 08/09/2015 at 11:51 am to Dr. Fredia Sorrow , who verbally acknowledged these results. Electronically Signed   By: Lowella Grip III M.D.   On: 08/09/2015 11:51   Ct Angio Neck W/cm &/or Wo/cm  08/09/2015  CLINICAL DATA:  59 year old female with right side double vision and dizziness since 0830 hours. Code stroke. Initial encounter. EXAM: CT ANGIOGRAPHY HEAD AND NECK TECHNIQUE: Multidetector CT imaging of the head and neck was  performed using the standard protocol during bolus administration of intravenous contrast. Multiplanar CT image reconstructions and MIPs were obtained to evaluate the vascular anatomy. Carotid stenosis measurements (when applicable) are obtained utilizing NASCET criteria, using the distal internal carotid diameter as the denominator. CONTRAST:  139m OMNIPAQUE IOHEXOL 350 MG/ML SOLN, 1041mOMNIPAQUE IOHEXOL 350 MG/ML SOLN COMPARISON:  Head CT without contrast 1133 hours today. Cervical spine MRI 03/19/2004. FINDINGS: CTA NECK Skeleton: Previous C6-C7 ACDF. Degenerative osseous changes throughout the cervical spine. No acute osseous abnormality identified. Visualized paranasal sinuses and mastoids are clear. Other neck: Cardiomegaly. No pericardial effusion. Mild dependent pulmonary atelectasis. No mediastinal lymphadenopathy. Thyroid, larynx, pharynx, parapharyngeal spaces, retropharyngeal space (retropharyngeal course of both carotids), sublingual space, submandibular glands, parotid glands, and orbits soft tissues are within normal limits. No cervical lymphadenopathy. Aortic arch: 3 vessel arch configuration. Minimal arch atherosclerosis. No great vessel origin stenosis. Right carotid system: Tortuous brachiocephalic artery. Normal right CCA origin. Tortuous proximal right CCA. Mild calcified plaque at the medial right carotid bifurcation. No right ICA origin or bulb stenosis. Partially retropharyngeal course of the cervical right ICA which otherwise appears normal to the skullbase. Left carotid system: Normal left CCA origin. Tortuous proximal left CCA with a an acutely kinked appearance (series 4, image 63). Negative left carotid bifurcation. Retropharyngeal course of the proximal left ICA. No left ICA atherosclerosis or stenosis in the neck. Vertebral arteries:No proximal right subclavian artery stenosis. Normal right vertebral artery origin (series 610, image 55). No right vertebral artery stenosis identified  to the skullbase. No proximal left subclavian stenosis. Tortuous proximal left subclavian at the thoracic inlet. Normal left vertebral artery origin. Tortuous left V1 segment. No cervical left vertebral artery stenosis. CTA HEAD Posterior circulation: Codominant distal vertebral arteries are patent without stenosis. PICA origins and vertebrobasilar junction are patent. No basilar artery stenosis. SCA and PCA origins are normal. Posterior communicating arteries are diminutive or absent. Bilateral PCA branches are within normal limits. Anterior circulation: Both ICA siphons are  patent. Calcified cavernous segment plaque greater on the left. No hemodynamically significant siphon stenosis. Ophthalmic artery origins are within normal limits. Carotid termini carotid termini are patent. Dominant right ACA A1 segment, the left is diminutive. Anterior communicating artery and bilateral ACA branches are within normal limits. Bilateral MCA M1 segments are patent. Left M1 segment, left MCA bifurcation, and left MCA branches are within normal limits. Right M1 segment, MCA bifurcation, and right MCA branches are within normal limits. Venous sinuses: Patent. Anatomic variants: Dominant right ACA A1 segment. Delayed phase: No abnormal enhancement identified. No cortically based acute infarct identified. IMPRESSION: 1. Negative intracranial CTA, with no emergent large vessel occlusion. 2. Very mild carotid atherosclerosis at the right carotid bifurcation and in both ICA siphons. No associated stenosis. 3. No vertebral artery stenosis. 4. Stable and negative CT appearance of the brain. 5. Cardiomegaly.  Cervical spine degeneration, previous C6-C7 ACDF. Electronically Signed   By: Genevie Ann M.D.   On: 08/09/2015 15:13   Mr Brain Wo Contrast  08/11/2015  CLINICAL DATA:  Slurred speech, syncope, and confusion for 3 days. EXAM: MRI HEAD WITHOUT CONTRAST TECHNIQUE: Multiplanar, multiecho pulse sequences of the brain and surrounding  structures were obtained without intravenous contrast. COMPARISON:  CT head without contrast in CTA head and neck 08/11/2015. FINDINGS: Diffusion-weighted images demonstrate no evidence for acute or subacute infarction. No acute infarct, hemorrhage, or mass lesion is present. No significant white matter disease is present. Insert pass ventricles insert pass fluid Flow is present in the major intracranial arteries. The globes and orbits are intact. The paranasal sinuses and mastoid air cells are clear. The skullbase is within normal limits. Midline sagittal images are unremarkable. IMPRESSION: Negative MRI the brain. Electronically Signed   By: San Morelle M.D.   On: 08/11/2015 07:53    Medications:  Prior to Admission:  Prescriptions prior to admission  Medication Sig Dispense Refill Last Dose  . amLODipine-benazepril (LOTREL) 5-20 MG per capsule Take 2 capsules by mouth daily.   08/09/2015 at Unknown time  . amphetamine-dextroamphetamine (ADDERALL) 20 MG tablet Take 20 mg by mouth daily.   0 08/09/2015 at Unknown time  . aspirin EC 81 MG tablet Take 81 mg by mouth daily.   08/09/2015 at Unknown time  . atorvastatin (LIPITOR) 10 MG tablet Take 10 mg by mouth at bedtime.   08/08/2015 at Unknown time  . Bisacodyl (DULCOLAX PO) Take by mouth as needed.   08/08/2015 at Unknown time  . carbamazepine (TEGRETOL XR) 200 MG 12 hr tablet Take 600 mg by mouth at bedtime.    08/08/2015 at 2130  . clonazePAM (KLONOPIN) 1 MG tablet Take 1 mg by mouth 4 (four) times daily as needed for anxiety.   08/09/2015 at Unknown time  . lamoTRIgine (LAMICTAL) 200 MG tablet Take 200 mg by mouth 2 (two) times daily.    08/09/2015 at 830a  . LATUDA 60 MG TABS Take 60 mg by mouth at bedtime.   0 08/08/2015 at Unknown time  . metFORMIN (GLUCOPHAGE-XR) 500 MG 24 hr tablet Take 500 mg by mouth 2 (two) times daily.   08/09/2015 at Unknown time  . ondansetron (ZOFRAN) 4 MG tablet Take 1 tablet (4 mg total) by mouth every 8  (eight) hours as needed for nausea or vomiting. 30 tablet 1 Past Week at Unknown time  . OVER THE COUNTER MEDICATION Take by mouth at bedtime as needed (for sleep). OTC Zquill takes prn   Past Week at Unknown time  .  oxybutynin (DITROPAN-XL) 10 MG 24 hr tablet Take 10 mg by mouth daily.    Past Week at Unknown time  . pantoprazole (PROTONIX) 40 MG tablet Take 1 tablet (40 mg total) by mouth daily before breakfast. 30 tablet 5 08/09/2015 at Unknown time  . polyethylene glycol (MIRALAX / GLYCOLAX) packet Take 17 g by mouth 2 (two) times daily.   Past Week at Unknown time  . ranitidine (ZANTAC) 150 MG tablet TAKE ONE TABLET BY MOUTH TWICE DAILY  8 08/09/2015 at Unknown time   Scheduled: . amLODipine  10 mg Oral Daily  . amphetamine-dextroamphetamine  20 mg Oral Daily  . aspirin  325 mg Oral Daily  . atorvastatin  10 mg Oral QHS  . benazepril  40 mg Oral Daily  . carbamazepine  600 mg Oral QHS  . enoxaparin (LOVENOX) injection  40 mg Subcutaneous Q24H  . famotidine  20 mg Oral Daily  . HYDROcodone-acetaminophen  1 tablet Oral Once  . insulin aspart  0-15 Units Subcutaneous TID WC  . insulin aspart  0-5 Units Subcutaneous QHS  . lamoTRIgine  200 mg Oral BID  . lurasidone  60 mg Oral QHS  . metFORMIN  500 mg Oral BID WC  . oxybutynin  10 mg Oral Daily  . pantoprazole  40 mg Oral QAC breakfast  . polyethylene glycol  17 g Oral BID   Continuous: . sodium chloride 75 mL/hr at 08/10/15 2031   OMA:[id number], ondansetron  Assesment: She was admitted with what was thought to be a CVA but I think it's more of a TIA. She is back to baseline neurologically. MRI was negative. Active Problems:   Diabetes [facility])   Essential hypertension   Bipolar 1 disorder (HCC)   CVA (cerebral infarction)    Plan: Continue current treatments. She was taking baby aspirin intermittently at home and had not had any for about a week when this occurred. I'm going to send her home on a full aspirin daily. She will  need follow-up with neurology.    LOS: 2 days   Linda English 08/11/2015, 8:28 AM

## 2015-08-11 NOTE — Progress Notes (Signed)
OT Screen Note  Patient Details Name: MALIK OAKLAND MRN: [id number] DOB: 1956/08/09   Cancelled Treatment:    Reason Eval/Treat Not Completed: OT screened, no needs identified, will sign off.  OT orders received. Patient's chart reviewed. Patient was evaluated by PT over the weekend and was found to be independent and at baseline. All deficits have resolved. No OT needs at this time. Thank you for the referral.  Ailene Ravel, OTR/L,CBIS  [phone]  08/11/2015, 8:06 AM

## 2015-08-11 NOTE — Progress Notes (Signed)
Patient discharged with instructions, prescription, and care notes.  Verbalized understanding via teach back.  IV was removed and the site was WNL. Patient voiced no further complaints or concerns at the time of discharge.  Appointments scheduled per instructions.  Patient left the floor via ambulation with staff and family in stable condition. 

## 2015-08-15 DIAGNOSIS — N3281 Overactive bladder: Secondary | ICD-10-CM | POA: Diagnosis not present

## 2015-08-16 NOTE — Discharge Summary (Signed)
Physician Discharge Summary  Patient ID: Linda English MRN: [id number] DOB/AGE: [birth year] 59 y.o. Primary Care Physician:FAGAN,ROY, MD Admit date: 08/09/2015 Discharge date: 08/16/2015    Discharge Diagnoses:   Principal Problem:   Transient ischemic attack Active Problems:   Diabetes [facility])   Essential hypertension   Bipolar 1 disorder ([place])     Medication List    STOP taking these medications        aspirin EC 81 MG tablet  Replaced by:  aspirin 325 MG tablet      TAKE these medications        amLODipine-benazepril 5-20 MG capsule  Commonly known as:  LOTREL  Take 2 capsules by mouth daily.     amphetamine-dextroamphetamine 20 MG tablet  Commonly known as:  ADDERALL  Take 20 mg by mouth daily.     aspirin 325 MG tablet  Take 1 tablet (325 mg total) by mouth daily.     atorvastatin 10 MG tablet  Commonly known as:  LIPITOR  Take 10 mg by mouth at bedtime.     carbamazepine 200 MG 12 hr tablet  Commonly known as:  TEGRETOL XR  Take 600 mg by mouth at bedtime.     clonazePAM 1 MG tablet  Commonly known as:  KLONOPIN  Take 1 mg by mouth 4 (four) times daily as needed for anxiety.     DULCOLAX PO  Take by mouth as needed.     lamoTRIgine 200 MG tablet  Commonly known as:  LAMICTAL  Take 200 mg by mouth 2 (two) times daily.     LATUDA 60 MG Tabs  Generic drug:  Lurasidone HCl  Take 60 mg by mouth at bedtime.     metFORMIN 500 MG 24 hr tablet  Commonly known as:  GLUCOPHAGE-XR  Take 500 mg by mouth 2 (two) times daily.     ondansetron 4 MG tablet  Commonly known as:  ZOFRAN  Take 1 tablet (4 mg total) by mouth every 8 (eight) hours as needed for nausea or vomiting.     OVER THE COUNTER MEDICATION  Take by mouth at bedtime as needed (for sleep). OTC Zquill takes prn     oxybutynin 10 MG 24 hr tablet  Commonly known as:  DITROPAN-XL  Take 10 mg by mouth daily.     pantoprazole 40 MG tablet  Commonly known as:  PROTONIX  Take 1 tablet  (40 mg total) by mouth daily before breakfast.     polyethylene glycol packet  Commonly known as:  MIRALAX / GLYCOLAX  Take 17 g by mouth 2 (two) times daily.     ranitidine 150 MG tablet  Commonly known as:  ZANTAC  TAKE ONE TABLET BY MOUTH TWICE DAILY        Discharged Condition: Improved    Consults: None  Significant Diagnostic Studies: Ct Angio Head W/cm &/or Wo Cm  08/09/2015  CLINICAL DATA:  59 year old female with right side double vision and dizziness since 0830 hours. Code stroke. Initial encounter. EXAM: CT ANGIOGRAPHY HEAD AND NECK TECHNIQUE: Multidetector CT imaging of the head and neck was performed using the standard protocol during bolus administration of intravenous contrast. Multiplanar CT image reconstructions and MIPs were obtained to evaluate the vascular anatomy. Carotid stenosis measurements (when applicable) are obtained utilizing NASCET criteria, using the distal internal carotid diameter as the denominator. CONTRAST:  178mL OMNIPAQUE IOHEXOL 350 MG/ML SOLN, 144mL OMNIPAQUE IOHEXOL 350 MG/ML SOLN COMPARISON:  Head CT without contrast 1133 hours today.  Cervical spine MRI 03/19/2004. FINDINGS: CTA NECK Skeleton: Previous C6-C7 ACDF. Degenerative osseous changes throughout the cervical spine. No acute osseous abnormality identified. Visualized paranasal sinuses and mastoids are clear. Other neck: Cardiomegaly. No pericardial effusion. Mild dependent pulmonary atelectasis. No mediastinal lymphadenopathy. Thyroid, larynx, pharynx, parapharyngeal spaces, retropharyngeal space (retropharyngeal course of both carotids), sublingual space, submandibular glands, parotid glands, and orbits soft tissues are within normal limits. No cervical lymphadenopathy. Aortic arch: 3 vessel arch configuration. Minimal arch atherosclerosis. No great vessel origin stenosis. Right carotid system: Tortuous brachiocephalic artery. Normal right CCA origin. Tortuous proximal right CCA. Mild calcified  plaque at the medial right carotid bifurcation. No right ICA origin or bulb stenosis. Partially retropharyngeal course of the cervical right ICA which otherwise appears normal to the skullbase. Left carotid system: Normal left CCA origin. Tortuous proximal left CCA with a an acutely kinked appearance (series 4, image 63). Negative left carotid bifurcation. Retropharyngeal course of the proximal left ICA. No left ICA atherosclerosis or stenosis in the neck. Vertebral arteries:No proximal right subclavian artery stenosis. Normal right vertebral artery origin (series 610, image 55). No right vertebral artery stenosis identified to the skullbase. No proximal left subclavian stenosis. Tortuous proximal left subclavian at the thoracic inlet. Normal left vertebral artery origin. Tortuous left V1 segment. No cervical left vertebral artery stenosis. CTA HEAD Posterior circulation: Codominant distal vertebral arteries are patent without stenosis. PICA origins and vertebrobasilar junction are patent. No basilar artery stenosis. SCA and PCA origins are normal. Posterior communicating arteries are diminutive or absent. Bilateral PCA branches are within normal limits. Anterior circulation: Both ICA siphons are patent. Calcified cavernous segment plaque greater on the left. No hemodynamically significant siphon stenosis. Ophthalmic artery origins are within normal limits. Carotid termini carotid termini are patent. Dominant right ACA A1 segment, the left is diminutive. Anterior communicating artery and bilateral ACA branches are within normal limits. Bilateral MCA M1 segments are patent. Left M1 segment, left MCA bifurcation, and left MCA branches are within normal limits. Right M1 segment, MCA bifurcation, and right MCA branches are within normal limits. Venous sinuses: Patent. Anatomic variants: Dominant right ACA A1 segment. Delayed phase: No abnormal enhancement identified. No cortically based acute infarct identified.  IMPRESSION: 1. Negative intracranial CTA, with no emergent large vessel occlusion. 2. Very mild carotid atherosclerosis at the right carotid bifurcation and in both ICA siphons. No associated stenosis. 3. No vertebral artery stenosis. 4. Stable and negative CT appearance of the brain. 5. Cardiomegaly.  Cervical spine degeneration, previous C6-C7 ACDF. Electronically Signed   By: Genevie Ann M.D.   On: 08/09/2015 15:13   Ct Head Wo Contrast  08/09/2015  CLINICAL DATA:  Slurred speech with dizziness and blurred vision EXAM: CT HEAD WITHOUT CONTRAST TECHNIQUE: Contiguous axial images were obtained from the base of the skull through the vertex without intravenous contrast. COMPARISON:  None. FINDINGS: The ventricles are normal in size and configuration. There is mild cerebellar atrophy. There is no intracranial mass, hemorrhage, extra-axial fluid collection, or midline shift. The gray-white compartments appear normal. No acute infarct evident. The middle cerebral artery attenuation is symmetric and normal bilaterally. The bony calvarium appears intact. The mastoid air cells are clear. No intraorbital lesions are identified. IMPRESSION: Mild cerebellar atrophy. No intracranial mass, hemorrhage, or edema. No acute infarct evident. Critical Value/emergent results were called by telephone at the time of interpretation on 08/09/2015 at 11:51 am to Dr. Fredia Sorrow , who verbally acknowledged these results. Electronically Signed   By: Lowella Grip III  M.D.   On: 08/09/2015 11:51   Ct Angio Neck W/cm &/or Wo/cm  08/09/2015  CLINICAL DATA:  59 year old female with right side double vision and dizziness since 0830 hours. Code stroke. Initial encounter. EXAM: CT ANGIOGRAPHY HEAD AND NECK TECHNIQUE: Multidetector CT imaging of the head and neck was performed using the standard protocol during bolus administration of intravenous contrast. Multiplanar CT image reconstructions and MIPs were obtained to evaluate the  vascular anatomy. Carotid stenosis measurements (when applicable) are obtained utilizing NASCET criteria, using the distal internal carotid diameter as the denominator. CONTRAST:  197mL OMNIPAQUE IOHEXOL 350 MG/ML SOLN, 158mL OMNIPAQUE IOHEXOL 350 MG/ML SOLN COMPARISON:  Head CT without contrast 1133 hours today. Cervical spine MRI 03/19/2004. FINDINGS: CTA NECK Skeleton: Previous C6-C7 ACDF. Degenerative osseous changes throughout the cervical spine. No acute osseous abnormality identified. Visualized paranasal sinuses and mastoids are clear. Other neck: Cardiomegaly. No pericardial effusion. Mild dependent pulmonary atelectasis. No mediastinal lymphadenopathy. Thyroid, larynx, pharynx, parapharyngeal spaces, retropharyngeal space (retropharyngeal course of both carotids), sublingual space, submandibular glands, parotid glands, and orbits soft tissues are within normal limits. No cervical lymphadenopathy. Aortic arch: 3 vessel arch configuration. Minimal arch atherosclerosis. No great vessel origin stenosis. Right carotid system: Tortuous brachiocephalic artery. Normal right CCA origin. Tortuous proximal right CCA. Mild calcified plaque at the medial right carotid bifurcation. No right ICA origin or bulb stenosis. Partially retropharyngeal course of the cervical right ICA which otherwise appears normal to the skullbase. Left carotid system: Normal left CCA origin. Tortuous proximal left CCA with a an acutely kinked appearance (series 4, image 63). Negative left carotid bifurcation. Retropharyngeal course of the proximal left ICA. No left ICA atherosclerosis or stenosis in the neck. Vertebral arteries:No proximal right subclavian artery stenosis. Normal right vertebral artery origin (series 610, image 55). No right vertebral artery stenosis identified to the skullbase. No proximal left subclavian stenosis. Tortuous proximal left subclavian at the thoracic inlet. Normal left vertebral artery origin. Tortuous left V1  segment. No cervical left vertebral artery stenosis. CTA HEAD Posterior circulation: Codominant distal vertebral arteries are patent without stenosis. PICA origins and vertebrobasilar junction are patent. No basilar artery stenosis. SCA and PCA origins are normal. Posterior communicating arteries are diminutive or absent. Bilateral PCA branches are within normal limits. Anterior circulation: Both ICA siphons are patent. Calcified cavernous segment plaque greater on the left. No hemodynamically significant siphon stenosis. Ophthalmic artery origins are within normal limits. Carotid termini carotid termini are patent. Dominant right ACA A1 segment, the left is diminutive. Anterior communicating artery and bilateral ACA branches are within normal limits. Bilateral MCA M1 segments are patent. Left M1 segment, left MCA bifurcation, and left MCA branches are within normal limits. Right M1 segment, MCA bifurcation, and right MCA branches are within normal limits. Venous sinuses: Patent. Anatomic variants: Dominant right ACA A1 segment. Delayed phase: No abnormal enhancement identified. No cortically based acute infarct identified. IMPRESSION: 1. Negative intracranial CTA, with no emergent large vessel occlusion. 2. Very mild carotid atherosclerosis at the right carotid bifurcation and in both ICA siphons. No associated stenosis. 3. No vertebral artery stenosis. 4. Stable and negative CT appearance of the brain. 5. Cardiomegaly.  Cervical spine degeneration, previous C6-C7 ACDF. Electronically Signed   By: Genevie Ann M.D.   On: 08/09/2015 15:13   Mr Brain Wo Contrast  08/11/2015  CLINICAL DATA:  Slurred speech, syncope, and confusion for 3 days. EXAM: MRI HEAD WITHOUT CONTRAST TECHNIQUE: Multiplanar, multiecho pulse sequences of the brain and surrounding structures were obtained  without intravenous contrast. COMPARISON:  CT head without contrast in CTA head and neck 08/11/2015. FINDINGS: Diffusion-weighted images  demonstrate no evidence for acute or subacute infarction. No acute infarct, hemorrhage, or mass lesion is present. No significant white matter disease is present. Insert pass ventricles insert pass fluid Flow is present in the major intracranial arteries. The globes and orbits are intact. The paranasal sinuses and mastoid air cells are clear. The skullbase is within normal limits. Midline sagittal images are unremarkable. IMPRESSION: Negative MRI the brain. Electronically Signed   By: San Morelle M.D.   On: 08/11/2015 07:53    Lab Results: Basic Metabolic Panel: No results for input(s): NA, K, CL, CO2, GLUCOSE, BUN, CREATININE, CALCIUM, MG, PHOS in the last 72 hours. Liver Function Tests: No results for input(s): AST, ALT, ALKPHOS, BILITOT, PROT, ALBUMIN in the last 72 hours.   CBC: No results for input(s): WBC, NEUTROABS, HGB, HCT, MCV, PLT in the last 72 hours.  No results found for this or any previous visit (from the past 240 hour(s)).   Hospital Course: This is a 59 year old who came to the emergency department with symptoms of a TIA. It was thought that she might have a full-blown stroke but her symptoms resolved. MRI did not show evidence for stroke. She was on baby aspirin but did not take that routinely. She had PT and OT evaluation was not felt to need any further treatment. Neurology consultation will be obtained as an outpatient. She was back at baseline at time of discharge  Discharge Exam: Blood pressure 172/80, pulse 85, temperature 98.6 F (37 C), temperature source Oral, resp. rate 18, height 5\' 2"  (1.575 m), weight 71.668 kg (158 lb), SpO2 100 %. She is awake and alert. She is neurologically intact  Disposition: Home. She will take a full 325 mg aspirin daily follow-up with Dr. Willey Blade her primary care physician and follow-up with neurology.      Discharge Instructions    Discharge patient    Complete by:  As directed            Follow-up Information     Follow up with [facility], Trey Sailors, MD On 08/21/2015.   Specialty:  Neurology   Why:  at 10:15 am   Contact information:   [address] [place] [address] [phone]       Signed: Sherree Shankman L   08/16/2015, 9:22 AM

## 2015-08-21 DIAGNOSIS — R4182 Altered mental status, unspecified: Secondary | ICD-10-CM | POA: Diagnosis not present

## 2015-08-21 DIAGNOSIS — G459 Transient cerebral ischemic attack, unspecified: Secondary | ICD-10-CM | POA: Diagnosis not present

## 2015-08-21 DIAGNOSIS — E119 Type 2 diabetes mellitus without complications: Secondary | ICD-10-CM | POA: Diagnosis not present

## 2015-08-28 DIAGNOSIS — E030 Congenital hypothyroidism with diffuse goiter: Secondary | ICD-10-CM | POA: Diagnosis not present

## 2015-08-28 DIAGNOSIS — I10 Essential (primary) hypertension: Secondary | ICD-10-CM | POA: Diagnosis not present

## 2015-08-28 DIAGNOSIS — E119 Type 2 diabetes mellitus without complications: Secondary | ICD-10-CM | POA: Diagnosis not present

## 2015-09-05 DIAGNOSIS — R569 Unspecified convulsions: Secondary | ICD-10-CM | POA: Diagnosis not present

## 2015-09-08 ENCOUNTER — Telehealth: Payer: Self-pay | Admitting: Internal Medicine

## 2015-09-08 NOTE — Telephone Encounter (Signed)
Agree 

## 2015-09-08 NOTE — Telephone Encounter (Signed)
Patient called and stated that she is having problems with her medication and it is causing her to "hit the floor" and she is out for a few hours when it happens   Please call her back [phone]

## 2015-09-08 NOTE — Telephone Encounter (Signed)
I called pt and she said she is not sure what meds or if it is the meds that are causing her to almost pass out. She went to the ED in 07/2015 for it. She said she takes most of her meds with other meds and does not know what is causing her to pass out and almost pass out. I told her to see her PCP at once and she said most of her meds come from Dr. Robina Ade, psychiatrist. I told her that she should talk to both of them and by all means if it happens again to call 911 and go to the ED.

## 2015-09-15 DIAGNOSIS — R2689 Other abnormalities of gait and mobility: Secondary | ICD-10-CM | POA: Diagnosis not present

## 2015-09-15 DIAGNOSIS — R4182 Altered mental status, unspecified: Secondary | ICD-10-CM | POA: Diagnosis not present

## 2015-09-15 DIAGNOSIS — E119 Type 2 diabetes mellitus without complications: Secondary | ICD-10-CM | POA: Diagnosis not present

## 2015-09-15 DIAGNOSIS — G40219 Localization-related (focal) (partial) symptomatic epilepsy and epileptic syndromes with complex partial seizures, intractable, without status epilepticus: Secondary | ICD-10-CM | POA: Diagnosis not present

## 2015-09-15 DIAGNOSIS — F5101 Primary insomnia: Secondary | ICD-10-CM | POA: Diagnosis not present

## 2015-09-15 DIAGNOSIS — R42 Dizziness and giddiness: Secondary | ICD-10-CM | POA: Diagnosis not present

## 2015-09-25 ENCOUNTER — Encounter: Payer: Self-pay | Admitting: Gastroenterology

## 2015-09-25 ENCOUNTER — Ambulatory Visit (INDEPENDENT_AMBULATORY_CARE_PROVIDER_SITE_OTHER): Payer: Medicare Other | Admitting: Gastroenterology

## 2015-09-25 VITALS — BP 141/89 | HR 91 | Temp 98.3°F | Ht 62.0 in | Wt 155.4 lb

## 2015-09-25 DIAGNOSIS — K5909 Other constipation: Secondary | ICD-10-CM

## 2015-09-25 DIAGNOSIS — R110 Nausea: Secondary | ICD-10-CM | POA: Diagnosis not present

## 2015-09-25 MED ORDER — LINACLOTIDE 145 MCG PO CAPS: 145.0000 ug | ORAL_CAPSULE | Freq: Every day | ORAL | Status: DC

## 2015-09-25 NOTE — Assessment & Plan Note (Signed)
Has had what she states is abdominal pain with Linzess and class side effect of nausea with Amitiza. Had desired historically to avoid these, and we adjusted her regimen to include Miralax BID, dulcolax tablet daily, and a suppository M, W, F. This was not ideal but options were limited. However, I am pleased she would like to trial Linzess again. In fact, I discussed with her starting at the 145 mcg dosage once daily. If needed, we could open capsule in half and mix with water/applesauce, providing a novel approach that may work better for her.   Next surveillance colonoscopy May 2021 due to history of rectal cancer. Return in 3-4 months. Trial of IBgard as well.

## 2015-09-25 NOTE — Patient Instructions (Signed)
I have sent the prescription for Linzess back to your pharmacy. You may take 1 capsule each morning 30 minutes before breakfast. If you have loose stool, try to push through for at least a week or so to see if your body gets "used" to it.   We can also try opening the capsule, divide in 1/2 and mix in water or applesauce to take like that.  We will see you in 3 months!

## 2015-09-25 NOTE — Progress Notes (Signed)
CC'ED TO PCP 

## 2015-09-25 NOTE — Assessment & Plan Note (Signed)
Improved. EGD up-to-date. I feel this is secondary to significant constipation. Unable to exclude delayed gastric emptying. Only taking Zofran prn. Continue PPI BID and Zofran prn. If no improvement after constipation regimen, consider GES.

## 2015-09-25 NOTE — Progress Notes (Signed)
Referring Provider: [name], MD Primary Care Physician:  [name], MD  Primary GI: Dr. Gala Romney   Chief Complaint  Patient presents with  . Follow-up    HPI:   KIELEE BUI is a 60 y.o. female presenting today with a history of constipation, rectal cancer status post resection in 2003. Colonoscopy on 01/15/2015 showed normal residual rectum and colon. Surgical anastomosis at approximately 5 cm from the anal verge. Repeat surveillance colonoscopy in 5 years. Miralax BID, Dulcolax tablet daily, suppository M, W, F. Unfortunately, she was unable to tolerate Linzess and Amitiza. This regimen of MIralax, Dulcolax, and suppository has worked well for her historically.   Recently underwent EGD due to persistent reflux and nausea, vague dysphagia, with findings of : mild erosive reflux esophagitits. Status post passage of a Maloney dilator. Hiatal hernia. Gastric polyps and abnormal gastric mucosa of doubtful clinical significance. status post biopsy, benign fundic gland polyp, negative H.pylori.   Taking Zofran only once a day as needed. Nausea has improved. No dysphagia. Wants to tweak Miralax dosing. States she is going more frequently but still only one BM every week at a time. States she is having spells of dizziness, hitting floor/couch, can't move for several hours. They think she is having seizures. Sees Dr. Merlene Laughter. Doesn't think they are TIAs. Thinks she had abdominal pain with Linzess but she would like to try again.   Past Medical History  Diagnosis Date  . Anxiety   . Allergic rhinitis   . Acid reflux   . IBS (irritable bowel syndrome)   . Depression   . Panic disorder   . Chronic headache   . DM (diabetes mellitus) ([place])   . Hypertension   . Obstructive sleep apnea   . Lichen planus   . Arthritis     osteoarthritis  . Hiatal hernia   . Gastric erosions   . Diverticula of colon   . Hemorrhoids   . Tubular adenoma   . Gastric polyps     benign   . Bipolar  disorder ([place])   . Rectal cancer ([place]) 2003    ileostomy and reversal    Past Surgical History  Procedure Laterality Date  . Spine surgery  2001    L5,S1 HEMILAMINOTOMY AND DISCECTOMY/DR DEATON  . Cholecystectomy    . Low anterior rection  2003    RECTAL CANCER  . Cesarean section      X  2  . Appendectomy    . Colonoscopy  08/2007    DR Gala Romney, friable anal canal hemorrhoids, surgical anastomosis at 3cm, distal scattered tics  . Esophagogastroduodenoscopy  05/2002    DR Gala Romney, normal  . Colonoscopy  12/15/2010    anal papilla and internal hemorrhoids/diminutive polyp in the base of the cecum. Past, tubular adenoma.. Next colonoscopy due in April 2017.  . Abdominal hysterectomy    . Esophagogastroduodenoscopy  08/03/2011    RMR: small HH/ gastric polyps  . Back surgery    . Colonoscopy  10/27/2005    RMR: Anal canal hemorrhoids. Surgical anastomosis at 3 cm from the anal verge appeared normal. Few scattered distal diverticula. The residual  colonic mucosa appeared normal. I suspect the patient bled from hemorrhoids  . Colonoscopy N/A 01/15/2015    [id number] residual rectum and colon. next tcs 12/2019  . Esophagogastroduodenoscopy N/A 06/27/2015    Dr. Gala Romney: Mild erosive reflux esophagitits. Status post passage of a Maloney dilator. Hiatal hernia. Gastric polyps and abnormal gastric mucosa of doubtful clinical  significane. status post biopsy, benign fundic gland polyp, negative H.pylori  . Esophageal dilation N/A 06/27/2015    Procedure: ESOPHAGEAL DILATION;  Surgeon: Daneil Dolin, MD;  Location: [facility];  Service: Endoscopy;  Laterality: N/A;    Current Outpatient Prescriptions  Medication Sig Dispense Refill  . amLODipine-benazepril (LOTREL) 5-20 MG per capsule Take 2 capsules by mouth daily.    Marland Kitchen amphetamine-dextroamphetamine (ADDERALL) 20 MG tablet Take 20 mg by mouth daily.   0  . aspirin 325 MG tablet Take 1 tablet (325 mg total) by mouth daily. 30 tablet 12  .  atorvastatin (LIPITOR) 10 MG tablet Take 10 mg by mouth at bedtime.    . Bisacodyl (DULCOLAX PO) Take by mouth as needed.    . carbamazepine (TEGRETOL XR) 200 MG 12 hr tablet Take 600 mg by mouth at bedtime.     . clonazePAM (KLONOPIN) 1 MG tablet Take 1 mg by mouth 4 (four) times daily as needed for anxiety.    . lamoTRIgine (LAMICTAL) 200 MG tablet Take 200 mg by mouth 2 (two) times daily.     Marland Kitchen LATUDA 60 MG TABS Take 60 mg by mouth at bedtime.   0  . levETIRAcetam (KEPPRA) 500 MG tablet Take 250 mg by mouth 2 (two) times daily.   1  . metFORMIN (GLUCOPHAGE-XR) 500 MG 24 hr tablet Take 500 mg by mouth 2 (two) times daily.    . ondansetron (ZOFRAN) 4 MG tablet Take 1 tablet (4 mg total) by mouth every 8 (eight) hours as needed for nausea or vomiting. 30 tablet 1  . OVER THE COUNTER MEDICATION Take by mouth at bedtime as needed (for sleep). OTC Zquill takes prn    . oxybutynin (DITROPAN-XL) 10 MG 24 hr tablet Take 10 mg by mouth daily.     . pantoprazole (PROTONIX) 40 MG tablet Take 1 tablet (40 mg total) by mouth daily before breakfast. 30 tablet 5  . polyethylene glycol (MIRALAX / GLYCOLAX) packet Take 17 g by mouth 2 (two) times daily.    . ranitidine (ZANTAC) 150 MG tablet TAKE ONE TABLET BY MOUTH TWICE DAILY  8  . VESICARE 5 MG tablet TK 1 T PO QD IN THE MORNING  12   No current facility-administered medications for this visit.    Allergies as of 09/25/2015 - Review Complete 09/25/2015  Allergen Reaction Noted  . Lithium Nausea And Vomiting 06/27/2015  . Excedrin extra strength [aspirin-acetaminophen-caffeine] Other (See Comments) 10/28/2014    Family History  Problem Relation Age of Onset  . Colon cancer Paternal Grandfather 42  . Colon polyps Brother   . Colon polyps Cousin     Social History   Social History  . Marital Status: Divorced    Spouse Name: N/A  . Number of Children: N/A  . Years of Education: N/A   Occupational History  . teacher     [employer]    Social History Main Topics  . Smoking status: Never Smoker   . Smokeless tobacco: Never Used     Comment: Never smoked  . Alcohol Use: No  . Drug Use: No  . Sexual Activity: No   Other Topics Concern  . None   Social History Narrative    Review of Systems: As mentioned in HPI   Physical Exam: BP 141/89 mmHg  Pulse 91  Temp(Src) 98.3 F (36.8 C) (Oral)  Ht 5\' 2"  (1.575 m)  Wt 155 lb 6.4 oz (70.489 kg)  BMI 28.42 kg/m2  General:   Alert and oriented. No distress noted. Pleasant and cooperative.  Head:  Normocephalic and atraumatic. Abdomen:  +BS, soft, non-tender and non-distended. No rebound or guarding. Query midline ventral hernia, easily reducible Msk:  Symmetrical without gross deformities. Normal posture. Extremities:  Without edema. Neurologic:  Alert and  oriented x4;  grossly normal neurologically. Psych:  Alert and cooperative. Normal mood and affect.

## 2015-10-13 DIAGNOSIS — N959 Unspecified menopausal and perimenopausal disorder: Secondary | ICD-10-CM | POA: Diagnosis not present

## 2015-10-13 DIAGNOSIS — N3281 Overactive bladder: Secondary | ICD-10-CM | POA: Diagnosis not present

## 2015-10-13 DIAGNOSIS — Z1231 Encounter for screening mammogram for malignant neoplasm of breast: Secondary | ICD-10-CM | POA: Diagnosis not present

## 2015-10-15 ENCOUNTER — Telehealth: Payer: Self-pay | Admitting: Internal Medicine

## 2015-10-15 NOTE — Telephone Encounter (Signed)
Noted  

## 2015-10-15 NOTE — Telephone Encounter (Signed)
Spoke with the pt, she is taking linzess 147mcg and still having to take a dulcolax on occasion to have a bm. She is only having a bm about every 3-4 days. Spoke with AS- ok for pt to try linzess 257mcg. Left #3 boxes at the front desk. Pt is aware and will come by and pick them up.

## 2015-10-15 NOTE — Telephone Encounter (Signed)
Pt wants to speak with her nurse about Linzess and ducolax. She stool is too hard and is having problems with constipation. Please call [phone]

## 2015-10-22 ENCOUNTER — Telehealth: Payer: Self-pay | Admitting: Internal Medicine

## 2015-10-22 NOTE — Telephone Encounter (Signed)
Pt wanted JL to know that she has tried everything and can not go to the bathroom. Please advise [phone]

## 2015-10-23 ENCOUNTER — Telehealth: Payer: Self-pay | Admitting: Internal Medicine

## 2015-10-23 ENCOUNTER — Other Ambulatory Visit: Payer: Self-pay

## 2015-10-23 DIAGNOSIS — M6289 Other specified disorders of muscle: Secondary | ICD-10-CM

## 2015-10-23 DIAGNOSIS — K5900 Constipation, unspecified: Secondary | ICD-10-CM

## 2015-10-23 NOTE — Telephone Encounter (Signed)
Pt has been taking linzess 290, dulcolax and miralax and it has been 10 days since her last bm

## 2015-10-23 NOTE — Telephone Encounter (Signed)
Referral sent to [facility] has appointment on 11/27/2015 @ 1:15pm with Dr. Carlton Adam

## 2015-10-23 NOTE — Telephone Encounter (Signed)
Pt is aware. Ok to refer to [facility].  Please refer pt.

## 2015-10-23 NOTE — Telephone Encounter (Signed)
On 2/15, she was having a BM every 3-4 days with Linzess 145 mcg, and we increased to 290. Now, she is having worsening of constipation? It does not make sense on a higher dose. Have her take it WITH food. Take Miralax BID.   As long as she has no obstructive symptoms such as severe abdominal pain, distension, vomiting, may do a Miralax purge: take 1 dose of Miralax every hour followed by 8 ounces of water up to 6 doses.   Refer her to [facility]. She has been quite difficult to manage. ?wonder about pelvic floor dysfunction. They can evaluate for this.

## 2015-10-23 NOTE — Telephone Encounter (Signed)
Pt called asking to speak with nurse. I told her nurse was on another call and she would have to call her back. [phone]

## 2015-10-24 NOTE — Telephone Encounter (Signed)
Tried to call pt- NA 

## 2015-10-27 ENCOUNTER — Ambulatory Visit: Payer: Medicare Other | Attending: Neurology

## 2015-10-27 DIAGNOSIS — G4733 Obstructive sleep apnea (adult) (pediatric): Secondary | ICD-10-CM

## 2015-10-28 NOTE — Telephone Encounter (Signed)
Tried to call- NA 

## 2015-10-28 NOTE — Telephone Encounter (Signed)
Spoke with the pt, she is going to bathroom well now. Having diarrhea at times. She is going to go back to linzess 164mcg for a while and see if that evens things out for her.

## 2015-10-28 NOTE — Telephone Encounter (Signed)
Noted  

## 2015-10-28 NOTE — Telephone Encounter (Signed)
Tried to call again, NA

## 2015-11-13 ENCOUNTER — Other Ambulatory Visit (HOSPITAL_COMMUNITY): Payer: Self-pay | Admitting: Respiratory Therapy

## 2015-11-13 DIAGNOSIS — G4733 Obstructive sleep apnea (adult) (pediatric): Secondary | ICD-10-CM

## 2015-11-27 DIAGNOSIS — Z886 Allergy status to analgesic agent status: Secondary | ICD-10-CM | POA: Diagnosis not present

## 2015-11-27 DIAGNOSIS — Z79899 Other long term (current) drug therapy: Secondary | ICD-10-CM | POA: Diagnosis not present

## 2015-11-27 DIAGNOSIS — Z888 Allergy status to other drugs, medicaments and biological substances status: Secondary | ICD-10-CM | POA: Diagnosis not present

## 2015-11-27 DIAGNOSIS — Z85048 Personal history of other malignant neoplasm of rectum, rectosigmoid junction, and anus: Secondary | ICD-10-CM | POA: Diagnosis not present

## 2015-11-27 DIAGNOSIS — K5909 Other constipation: Secondary | ICD-10-CM | POA: Insufficient documentation

## 2015-11-27 DIAGNOSIS — E119 Type 2 diabetes mellitus without complications: Secondary | ICD-10-CM | POA: Diagnosis not present

## 2015-11-27 DIAGNOSIS — I10 Essential (primary) hypertension: Secondary | ICD-10-CM | POA: Diagnosis not present

## 2015-11-27 DIAGNOSIS — Z9889 Other specified postprocedural states: Secondary | ICD-10-CM | POA: Diagnosis not present

## 2015-11-27 DIAGNOSIS — Z7982 Long term (current) use of aspirin: Secondary | ICD-10-CM | POA: Diagnosis not present

## 2015-11-27 DIAGNOSIS — Z7984 Long term (current) use of oral hypoglycemic drugs: Secondary | ICD-10-CM | POA: Diagnosis not present

## 2015-11-27 DIAGNOSIS — Z9049 Acquired absence of other specified parts of digestive tract: Secondary | ICD-10-CM | POA: Diagnosis not present

## 2015-12-16 DIAGNOSIS — K5900 Constipation, unspecified: Secondary | ICD-10-CM | POA: Diagnosis not present

## 2015-12-16 DIAGNOSIS — K5909 Other constipation: Secondary | ICD-10-CM | POA: Diagnosis not present

## 2015-12-18 ENCOUNTER — Other Ambulatory Visit (HOSPITAL_COMMUNITY): Payer: Self-pay | Admitting: Respiratory Therapy

## 2015-12-18 DIAGNOSIS — G4733 Obstructive sleep apnea (adult) (pediatric): Secondary | ICD-10-CM

## 2015-12-21 ENCOUNTER — Encounter: Payer: Self-pay | Admitting: Neurology

## 2015-12-21 ENCOUNTER — Ambulatory Visit: Payer: Medicare Other | Attending: Neurology | Admitting: Sleep Medicine

## 2015-12-21 DIAGNOSIS — R4182 Altered mental status, unspecified: Secondary | ICD-10-CM | POA: Diagnosis not present

## 2015-12-21 DIAGNOSIS — G4733 Obstructive sleep apnea (adult) (pediatric): Secondary | ICD-10-CM | POA: Diagnosis not present

## 2015-12-21 DIAGNOSIS — E119 Type 2 diabetes mellitus without complications: Secondary | ICD-10-CM | POA: Diagnosis not present

## 2015-12-21 DIAGNOSIS — F5101 Primary insomnia: Secondary | ICD-10-CM | POA: Diagnosis not present

## 2015-12-21 DIAGNOSIS — R42 Dizziness and giddiness: Secondary | ICD-10-CM | POA: Diagnosis not present

## 2015-12-21 DIAGNOSIS — R2689 Other abnormalities of gait and mobility: Secondary | ICD-10-CM | POA: Diagnosis not present

## 2015-12-21 DIAGNOSIS — G40219 Localization-related (focal) (partial) symptomatic epilepsy and epileptic syndromes with complex partial seizures, intractable, without status epilepticus: Secondary | ICD-10-CM | POA: Diagnosis not present

## 2015-12-21 DIAGNOSIS — R569 Unspecified convulsions: Secondary | ICD-10-CM | POA: Diagnosis not present

## 2015-12-21 DIAGNOSIS — G459 Transient cerebral ischemic attack, unspecified: Secondary | ICD-10-CM | POA: Diagnosis not present

## 2015-12-22 DIAGNOSIS — E039 Hypothyroidism, unspecified: Secondary | ICD-10-CM | POA: Diagnosis not present

## 2015-12-22 DIAGNOSIS — I10 Essential (primary) hypertension: Secondary | ICD-10-CM | POA: Diagnosis not present

## 2015-12-22 DIAGNOSIS — Z79899 Other long term (current) drug therapy: Secondary | ICD-10-CM | POA: Diagnosis not present

## 2015-12-22 DIAGNOSIS — E119 Type 2 diabetes mellitus without complications: Secondary | ICD-10-CM | POA: Diagnosis not present

## 2015-12-24 ENCOUNTER — Ambulatory Visit (INDEPENDENT_AMBULATORY_CARE_PROVIDER_SITE_OTHER): Payer: Medicare Other | Admitting: Gastroenterology

## 2015-12-24 ENCOUNTER — Encounter: Payer: Self-pay | Admitting: Gastroenterology

## 2015-12-24 VITALS — BP 149/83 | HR 76 | Temp 97.2°F | Ht 62.0 in | Wt 158.6 lb

## 2015-12-24 DIAGNOSIS — K5909 Other constipation: Secondary | ICD-10-CM

## 2015-12-24 NOTE — Progress Notes (Signed)
Referring Provider: [name], MD Primary Care Physician:  [name], MD  Primary GI: Dr. Gala Romney   Chief Complaint  Patient presents with  . Follow-up    HPI:   Linda English is a 60 y.o. female presenting today with a history of constipation, rectal cancer status post resection in 2003. Colonoscopy on 01/15/2015 showed normal residual rectum and colon. Surgical anastomosis at approximately 5 cm from the anal verge. Repeat surveillance colonoscopy in 5 years. Historically, it has been difficult to manage her constipation. She was referred to [facility] in the interim from our last visit to be evaluated for pelvic floor dysfunction. They have recommended anorectal manometry. This was completed 4/18. I do not have the actual notes from this.   Taking Linzess 145 mcg daily with Miralax at least once a day. Tried stronger dosage of Linzess but then ran out of samples. Doesn't remember if she had to take Miralax with stronger dose of Linzess. Stool is still somewhat hard, still feels incomplete, unproductive when she goes. Goes back to [facility] in June 2017. States neurologist told her he felt she was having seizures.   Past Medical History  Diagnosis Date  . Anxiety   . Allergic rhinitis   . Acid reflux   . IBS (irritable bowel syndrome)   . Depression   . Panic disorder   . Chronic headache   . DM (diabetes mellitus) ([place])   . Hypertension   . Obstructive sleep apnea   . Lichen planus   . Arthritis     osteoarthritis  . Hiatal hernia   . Gastric erosions   . Diverticula of colon   . Hemorrhoids   . Tubular adenoma   . Gastric polyps     benign   . Bipolar disorder ([place])   . Rectal cancer ([place]) 2003    ileostomy and reversal    Past Surgical History  Procedure Laterality Date  . Spine surgery  2001    L5,S1 HEMILAMINOTOMY AND DISCECTOMY/DR DEATON  . Cholecystectomy    . Low anterior rection  2003    RECTAL CANCER  . Cesarean section      X  2  . Appendectomy     . Colonoscopy  08/2007    DR Gala Romney, friable anal canal hemorrhoids, surgical anastomosis at 3cm, distal scattered tics  . Esophagogastroduodenoscopy  05/2002    DR Gala Romney, normal  . Colonoscopy  12/15/2010    anal papilla and internal hemorrhoids/diminutive polyp in the base of the cecum. Past, tubular adenoma.. Next colonoscopy due in April 2017.  . Abdominal hysterectomy    . Esophagogastroduodenoscopy  08/03/2011    RMR: small HH/ gastric polyps  . Back surgery    . Colonoscopy  10/27/2005    RMR: Anal canal hemorrhoids. Surgical anastomosis at 3 cm from the anal verge appeared normal. Few scattered distal diverticula. The residual  colonic mucosa appeared normal. I suspect the patient bled from hemorrhoids  . Colonoscopy N/A 01/15/2015    [id number] residual rectum and colon. next tcs 12/2019  . Esophagogastroduodenoscopy N/A 06/27/2015    Dr. Gala Romney: Mild erosive reflux esophagitits. Status post passage of a Maloney dilator. Hiatal hernia. Gastric polyps and abnormal gastric mucosa of doubtful clinical significane. status post biopsy, benign fundic gland polyp, negative H.pylori  . Esophageal dilation N/A 06/27/2015    Procedure: ESOPHAGEAL DILATION;  Surgeon: Daneil Dolin, MD;  Location: [facility];  Service: Endoscopy;  Laterality: N/A;    Current Outpatient Prescriptions  Medication Sig Dispense Refill  . amLODipine-benazepril (LOTREL) 5-20 MG per capsule Take 2 capsules by mouth daily.    Marland Kitchen aspirin 325 MG tablet Take 1 tablet (325 mg total) by mouth daily. 30 tablet 12  . atorvastatin (LIPITOR) 10 MG tablet Take 10 mg by mouth at bedtime.    . Bisacodyl (DULCOLAX PO) Take by mouth as needed.    . carbamazepine (TEGRETOL XR) 200 MG 12 hr tablet Take 600 mg by mouth at bedtime.     . clonazePAM (KLONOPIN) 1 MG tablet Take 1 mg by mouth 4 (four) times daily as needed for anxiety.    . lamoTRIgine (LAMICTAL) 200 MG tablet Take 200 mg by mouth 2 (two) times daily.     Marland Kitchen LATUDA 60 MG  TABS Take 60 mg by mouth at bedtime.   0  . levETIRAcetam (KEPPRA) 500 MG tablet Take 250 mg by mouth 2 (two) times daily.   1  . Linaclotide (LINZESS) 145 MCG CAPS capsule Take 1 capsule (145 mcg total) by mouth daily. 90 capsule 3  . metFORMIN (GLUCOPHAGE-XR) 500 MG 24 hr tablet Take 500 mg by mouth 2 (two) times daily.    . ondansetron (ZOFRAN) 4 MG tablet Take 1 tablet (4 mg total) by mouth every 8 (eight) hours as needed for nausea or vomiting. 30 tablet 1  . OVER THE COUNTER MEDICATION Take by mouth at bedtime as needed (for sleep). OTC Zquill takes prn    . pantoprazole (PROTONIX) 40 MG tablet Take 1 tablet (40 mg total) by mouth daily before breakfast. 30 tablet 5  . polyethylene glycol (MIRALAX / GLYCOLAX) packet Take 17 g by mouth 2 (two) times daily.    . ranitidine (ZANTAC) 150 MG tablet TAKE ONE TABLET BY MOUTH TWICE DAILY  8  . VESICARE 5 MG tablet TK 1 T PO QD IN THE MORNING  12  . amphetamine-dextroamphetamine (ADDERALL) 20 MG tablet Take 20 mg by mouth daily. Reported on 12/24/2015  0  . oxybutynin (DITROPAN-XL) 10 MG 24 hr tablet Take 10 mg by mouth daily. Reported on 12/24/2015     No current facility-administered medications for this visit.    Allergies as of 12/24/2015 - Review Complete 12/24/2015  Allergen Reaction Noted  . Lithium Nausea And Vomiting 06/27/2015  . Excedrin extra strength [aspirin-acetaminophen-caffeine] Other (See Comments) 10/28/2014    Family History  Problem Relation Age of Onset  . Colon cancer Paternal Grandfather 80  . Colon polyps Brother   . Colon polyps Cousin     Social History   Social History  . Marital Status: Divorced    Spouse Name: N/A  . Number of Children: N/A  . Years of Education: N/A   Occupational History  . teacher     [employer]   Social History Main Topics  . Smoking status: Never Smoker   . Smokeless tobacco: Never Used     Comment: Never smoked  . Alcohol Use: No  . Drug Use: No  . Sexual Activity: No    Other Topics Concern  . None   Social History Narrative    Review of Systems: Negative unless mentioned on HPI.   Physical Exam: BP 149/83 mmHg  Pulse 76  Temp(Src) 97.2 F (36.2 C)  Ht 5\' 2"  (1.575 m)  Wt 158 lb 9.6 oz (71.94 kg)  BMI 29.00 kg/m2 General:   Alert and oriented. No distress noted. Pleasant and cooperative.  Head:  Normocephalic and atraumatic. Eyes:  Conjuctiva clear  without scleral icterus. Abdomen:  +BS, soft, non-tender and non-distended. No rebound or guarding. No HSM or masses noted. Msk:  Symmetrical without gross deformities. Normal posture. Extremities:  Without edema. Neurologic:  Alert and  oriented x4;  grossly normal neurologically. Psych:  Alert and cooperative. Normal mood and affect.

## 2015-12-24 NOTE — Progress Notes (Signed)
cc'ed to pcp °

## 2015-12-24 NOTE — Patient Instructions (Signed)
I have given you samples of Linzess 290 mcg to try.  I will try and get the results from the manometry you had done.  We will see you in 6 months or sooner if needed!

## 2015-12-24 NOTE — Assessment & Plan Note (Signed)
60 year old female with likely pelvic floor dysfunction, seen recently at [facility] for anorectal manometry. Will need to retreive these reports. She has an appt in June 2017. Increase Linzess to 290 mcg once daily. Samples provided. Patient to call if no improvement. Return in 6 months.

## 2015-12-27 NOTE — Sleep Study (Signed)
[place] A. Merlene Laughter, MD     [email or web address]             NOCTURNAL POLYSOMNOGRAPHY   LOCATION: [facility]   Demographics Edit Patient Name: Linda English, Linda English Date: 12/21/2015 Gender: Female D.O.B: November 27, 1955 Age (years): 13 Referring Provider: Phillips Odor MD, ABSM Height (inches): 62 Interpreting Physician: Phillips Odor MD, ABSM Weight (lbs): 155 RPSGT: Rosebud Poles BMI: 28 MRN: [id number] Neck Size: 13.50   CLINICAL INFORMATION EditMoveRemove this section Sleep Study Type: NPSG Indication for sleep study: N/A Epworth Sleepiness Score: 16 SLEEP STUDY TECHNIQUE EditMoveRemove this section As per the AASM Manual for the Scoring of Sleep and Associated Events v2.3 (April 2016) with a hypopnea requiring 4% desaturations. The channels recorded and monitored were frontal, central and occipital EEG, electrooculogram (EOG), submentalis EMG (chin), nasal and oral airflow, thoracic and abdominal wall motion, anterior tibialis EMG, snore microphone, electrocardiogram, and pulse oximetry. MEDICATIONS   Current outpatient prescriptions:  .  amLODipine-benazepril (LOTREL) 5-20 MG per capsule, Take 2 capsules by mouth daily., Disp: , Rfl:  .  aspirin 325 MG tablet, Take 1 tablet (325 mg total) by mouth daily., Disp: 30 tablet, Rfl: 12 .  atorvastatin (LIPITOR) 10 MG tablet, Take 10 mg by mouth at bedtime., Disp: , Rfl:  .  carbamazepine (TEGRETOL XR) 200 MG 12 hr tablet, Take 600 mg by mouth at bedtime. , Disp: , Rfl:  .  clonazePAM (KLONOPIN) 1 MG tablet, Take 1 mg by mouth 4 (four) times daily as needed for anxiety., Disp: , Rfl:  .  lamoTRIgine (LAMICTAL) 200 MG tablet, Take 200 mg by mouth 2 (two) times daily. , Disp: , Rfl:  .  LATUDA 60 MG TABS, Take 60 mg by mouth at bedtime. , Disp: , Rfl: 0 .  levETIRAcetam (KEPPRA) 500 MG tablet, Take 250 mg by mouth 2 (two) times daily. , Disp: , Rfl: 1 .  Linaclotide (LINZESS) 145 MCG CAPS capsule, Take 1  capsule (145 mcg total) by mouth daily., Disp: 90 capsule, Rfl: 3 .  metFORMIN (GLUCOPHAGE-XR) 500 MG 24 hr tablet, Take 500 mg by mouth 2 (two) times daily., Disp: , Rfl:  .  ondansetron (ZOFRAN) 4 MG tablet, Take 1 tablet (4 mg total) by mouth every 8 (eight) hours as needed for nausea or vomiting., Disp: 30 tablet, Rfl: 1 .  OVER THE COUNTER MEDICATION, Take by mouth at bedtime as needed (for sleep). OTC Zquill takes prn, Disp: , Rfl:  .  pantoprazole (PROTONIX) 40 MG tablet, Take 1 tablet (40 mg total) by mouth daily before breakfast., Disp: 30 tablet, Rfl: 5 .  polyethylene glycol (MIRALAX / GLYCOLAX) packet, Take 17 g by mouth 2 (two) times daily., Disp: , Rfl:  .  ranitidine (ZANTAC) 150 MG tablet, TAKE ONE TABLET BY MOUTH TWICE DAILY, Disp: , Rfl: 8 .  VESICARE 5 MG tablet, TK 1 T PO QD IN THE MORNING, Disp: , Rfl: 12  Patient's medications include: N/A. Medications self-administered by patient during sleep study : No sleep medicine administered. SLEEP ARCHITECTURE EditMoveRemove this section The study was initiated at 10:18:51 PM and ended at 4:55:59 AM. Sleep onset time was 62.8 minutes and the sleep efficiency was 79.5%. The total sleep time was 315.9 minutes. Stage REM latency was 112.0 minutes. The patient spent 2.69% of the night in stage N1 sleep, 73.09% in stage N2 sleep, 2.53% in stage N3 and 21.69% in REM. Alpha intrusion was absent. Supine sleep was 0.00%. RESPIRATORY PARAMETERS EditMoveRemove this section  The overall apnea/hypopnea index (AHI) was 0.0 per hour. There were 0 total apneas, including 0 obstructive, 0 central and 0 mixed apneas. There were 0 hypopneas and 0 RERAs. The AHI during Stage REM sleep was 0.0 per hour. AHI while supine was N/A per hour. The mean oxygen saturation was 95.91%. The minimum SpO2 during sleep was 93.00%. Loud snoring was noted during this study. CARDIAC DATA EditMoveRemove this section The 2 lead EKG demonstrated sinus rhythm. The mean  heart rate was N/A beats per minute. Other EKG findings include: None. LEG MOVEMENT DATA EditMoveRemove this section The total PLMS were 0 with a resulting PLMS index of 0.00. Associated arousal with leg movement index was 0.0.     IMPRESSIONS  No significant obstructive sleep apnea occurred during this study. No significant central sleep apnea occurred during this study.  Delano Metz, MD Diplomate, American Board of Sleep Medicine.

## 2016-01-04 ENCOUNTER — Other Ambulatory Visit: Payer: Self-pay | Admitting: Gastroenterology

## 2016-01-13 ENCOUNTER — Telehealth: Payer: Self-pay | Admitting: Internal Medicine

## 2016-01-13 DIAGNOSIS — G40219 Localization-related (focal) (partial) symptomatic epilepsy and epileptic syndromes with complex partial seizures, intractable, without status epilepticus: Secondary | ICD-10-CM | POA: Diagnosis not present

## 2016-01-13 DIAGNOSIS — F5101 Primary insomnia: Secondary | ICD-10-CM | POA: Diagnosis not present

## 2016-01-13 DIAGNOSIS — G459 Transient cerebral ischemic attack, unspecified: Secondary | ICD-10-CM | POA: Diagnosis not present

## 2016-01-13 DIAGNOSIS — R5383 Other fatigue: Secondary | ICD-10-CM | POA: Diagnosis not present

## 2016-01-13 DIAGNOSIS — R42 Dizziness and giddiness: Secondary | ICD-10-CM | POA: Diagnosis not present

## 2016-01-13 DIAGNOSIS — R4182 Altered mental status, unspecified: Secondary | ICD-10-CM | POA: Diagnosis not present

## 2016-01-13 DIAGNOSIS — M818 Other osteoporosis without current pathological fracture: Secondary | ICD-10-CM | POA: Diagnosis not present

## 2016-01-13 DIAGNOSIS — R569 Unspecified convulsions: Secondary | ICD-10-CM | POA: Diagnosis not present

## 2016-01-13 DIAGNOSIS — Z79899 Other long term (current) drug therapy: Secondary | ICD-10-CM | POA: Diagnosis not present

## 2016-01-13 DIAGNOSIS — E119 Type 2 diabetes mellitus without complications: Secondary | ICD-10-CM | POA: Diagnosis not present

## 2016-01-13 DIAGNOSIS — E559 Vitamin D deficiency, unspecified: Secondary | ICD-10-CM | POA: Diagnosis not present

## 2016-01-13 DIAGNOSIS — R2689 Other abnormalities of gait and mobility: Secondary | ICD-10-CM | POA: Diagnosis not present

## 2016-01-13 DIAGNOSIS — E538 Deficiency of other specified B group vitamins: Secondary | ICD-10-CM | POA: Diagnosis not present

## 2016-01-13 DIAGNOSIS — R413 Other amnesia: Secondary | ICD-10-CM | POA: Diagnosis not present

## 2016-01-13 NOTE — Telephone Encounter (Signed)
I spoke with the pt, she said she has been taking miralax and the linzess. She will have a bm about every 3 days. She has tried taking the linzess with food as well and it doesn't help. Today she took linzess and miralax in prune juice and finally had a good bm. She is now out of linzess and will need a refill if you want her to continue to take it.  She said it helps a little but not much.

## 2016-01-13 NOTE — Telephone Encounter (Signed)
Pt called to say that she needed a refill on her Linzess 290, but she also said that the Linzess was not helping. Please advise

## 2016-01-14 DIAGNOSIS — E785 Hyperlipidemia, unspecified: Secondary | ICD-10-CM | POA: Diagnosis not present

## 2016-01-14 DIAGNOSIS — Z6831 Body mass index (BMI) 31.0-31.9, adult: Secondary | ICD-10-CM | POA: Diagnosis not present

## 2016-01-14 DIAGNOSIS — E1149 Type 2 diabetes mellitus with other diabetic neurological complication: Secondary | ICD-10-CM | POA: Diagnosis not present

## 2016-01-14 DIAGNOSIS — I10 Essential (primary) hypertension: Secondary | ICD-10-CM | POA: Diagnosis not present

## 2016-01-14 MED ORDER — LINACLOTIDE 290 MCG PO CAPS: 290.0000 ug | ORAL_CAPSULE | Freq: Every day | ORAL | Status: DC

## 2016-01-14 NOTE — Telephone Encounter (Signed)
Pt is aware. She has appt with [facility] and in June.

## 2016-01-14 NOTE — Addendum Note (Signed)
Addended by: Orvil Feil on: 01/14/2016 01:37 PM   Modules accepted: Orders

## 2016-01-14 NOTE — Progress Notes (Signed)
Evidence of pelvic floor dysfunction on manometry, recommending biofeedback. Follow-up with [facility] June 2017.

## 2016-01-14 NOTE — Telephone Encounter (Signed)
I have sent in a 90 day supply. Take Miralax daily to BID in addition to Linzess. She is being evaluated at [facility] as well, with anorectal manometry noting Type IV dyssynergia present, positive for pelvic floor dysfunction. Recommended biofeedback, which [facility] would arrange. She should be seeing them June 2017. Further management per [facility], and I strongly recommend proceeding with biofeedback if they recommend this.   Orvil Feil, ANP-BC [facility]

## 2016-01-15 DIAGNOSIS — K5902 Outlet dysfunction constipation: Secondary | ICD-10-CM | POA: Diagnosis not present

## 2016-01-15 DIAGNOSIS — N3941 Urge incontinence: Secondary | ICD-10-CM | POA: Diagnosis not present

## 2016-01-18 ENCOUNTER — Other Ambulatory Visit: Payer: Self-pay | Admitting: Internal Medicine

## 2016-01-19 DIAGNOSIS — K5902 Outlet dysfunction constipation: Secondary | ICD-10-CM | POA: Diagnosis not present

## 2016-01-19 DIAGNOSIS — N3941 Urge incontinence: Secondary | ICD-10-CM | POA: Diagnosis not present

## 2016-01-29 DIAGNOSIS — K5900 Constipation, unspecified: Secondary | ICD-10-CM | POA: Diagnosis not present

## 2016-01-29 DIAGNOSIS — M25652 Stiffness of left hip, not elsewhere classified: Secondary | ICD-10-CM | POA: Diagnosis not present

## 2016-01-29 DIAGNOSIS — R198 Other specified symptoms and signs involving the digestive system and abdomen: Secondary | ICD-10-CM | POA: Diagnosis not present

## 2016-01-29 DIAGNOSIS — M25651 Stiffness of right hip, not elsewhere classified: Secondary | ICD-10-CM | POA: Diagnosis not present

## 2016-01-29 DIAGNOSIS — N3941 Urge incontinence: Secondary | ICD-10-CM | POA: Diagnosis not present

## 2016-01-29 DIAGNOSIS — K5909 Other constipation: Secondary | ICD-10-CM | POA: Diagnosis not present

## 2016-01-29 DIAGNOSIS — K5902 Outlet dysfunction constipation: Secondary | ICD-10-CM | POA: Diagnosis not present

## 2016-02-04 DIAGNOSIS — K5909 Other constipation: Secondary | ICD-10-CM | POA: Diagnosis not present

## 2016-02-04 DIAGNOSIS — N3941 Urge incontinence: Secondary | ICD-10-CM | POA: Diagnosis not present

## 2016-02-04 DIAGNOSIS — M25651 Stiffness of right hip, not elsewhere classified: Secondary | ICD-10-CM | POA: Diagnosis not present

## 2016-02-04 DIAGNOSIS — R198 Other specified symptoms and signs involving the digestive system and abdomen: Secondary | ICD-10-CM | POA: Diagnosis not present

## 2016-02-04 DIAGNOSIS — K5902 Outlet dysfunction constipation: Secondary | ICD-10-CM | POA: Diagnosis not present

## 2016-02-04 DIAGNOSIS — K5900 Constipation, unspecified: Secondary | ICD-10-CM | POA: Diagnosis not present

## 2016-02-04 IMAGING — CT CT HEAD W/O CM
2 of 4 series · 15 of 30 positions shown, 19 images · non-contrast
Comparison: None.

CLINICAL DATA: Slurred speech with dizziness and blurred vision

EXAM:
CT HEAD WITHOUT CONTRAST
TECHNIQUE: Contiguous axial images were obtained from the base of the skull
through the vertex without intravenous contrast.

[Series 4: headtrauma 2.4 h60s · axial · 0.46mm/px · z∈[+147,+297]mm · 13 of 72 slices shown, 17 images (1 of 2)]
[im 6/72  brain]
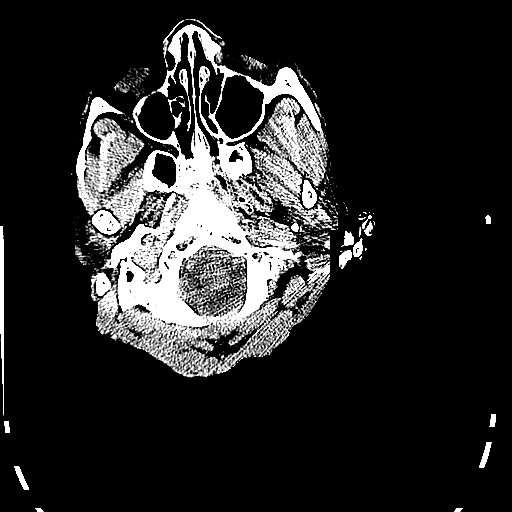
[im 6/72  bone]
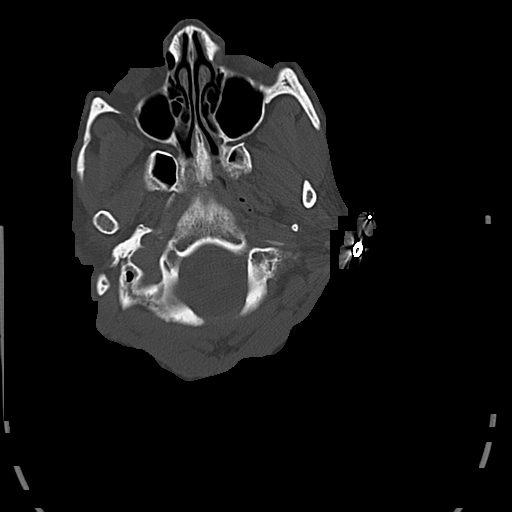
[im 11/72  brain]
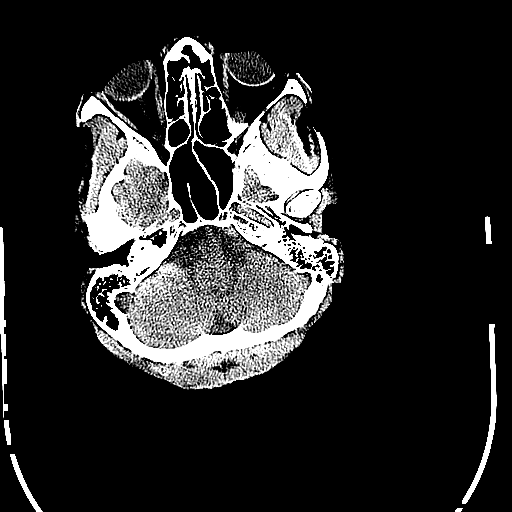
[im 16/72  brain]
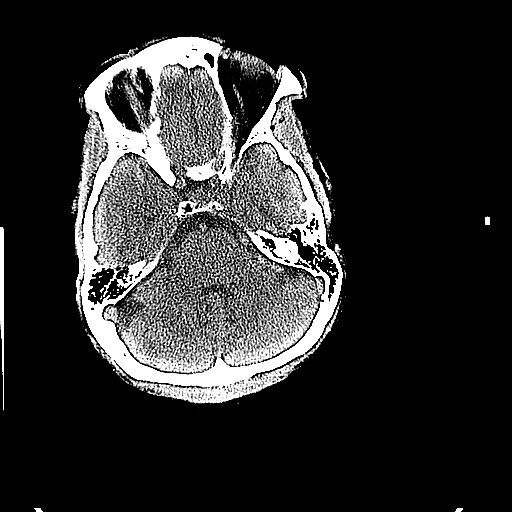
[im 21/72  brain]
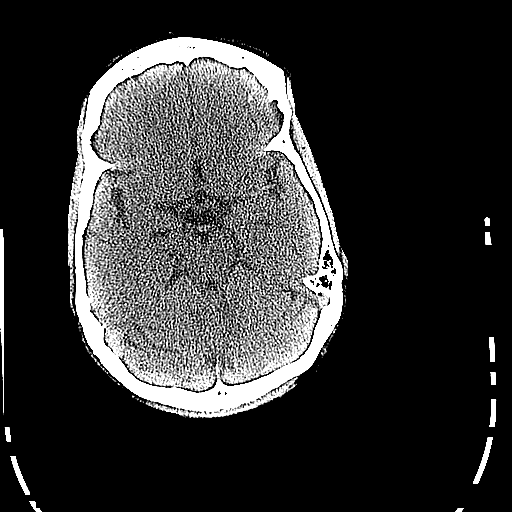
[im 26/72  brain]
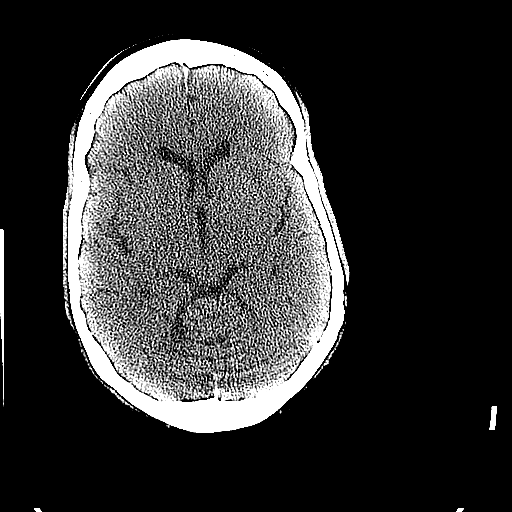
[im 26/72  bone]
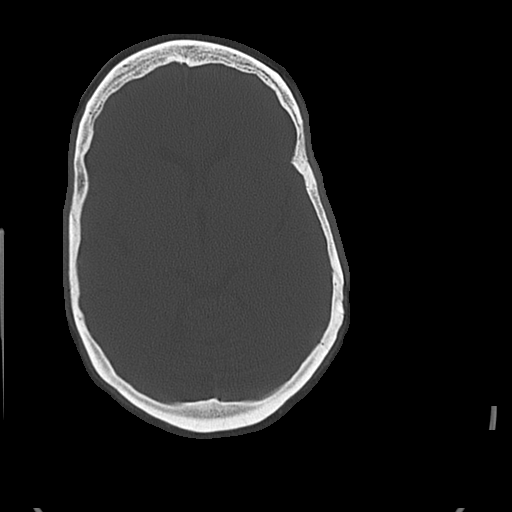
[im 31/72  brain]
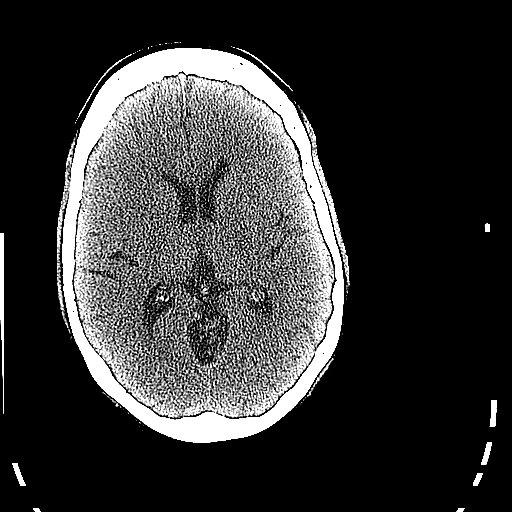
[im 36/72  brain]
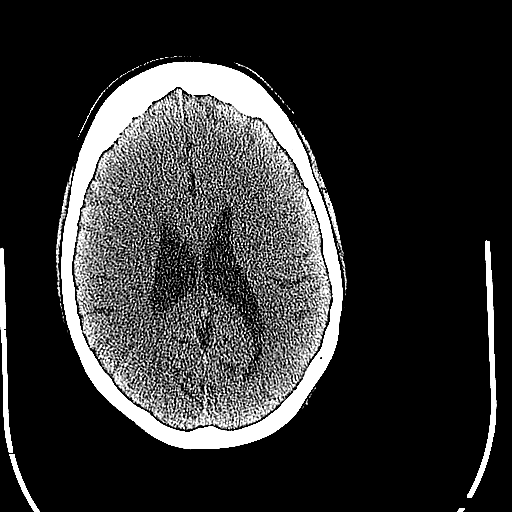
[im 41/72  brain]
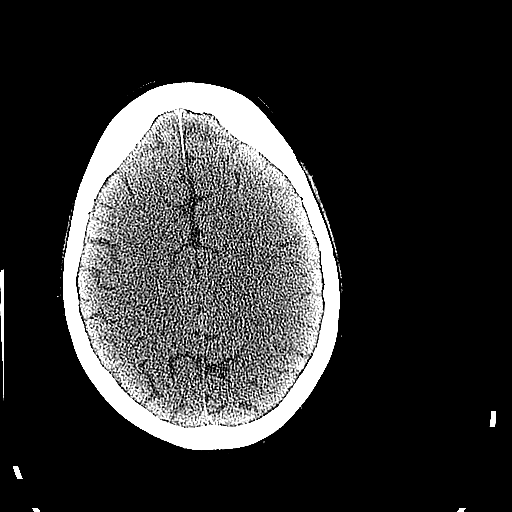
[im 46/72  brain]
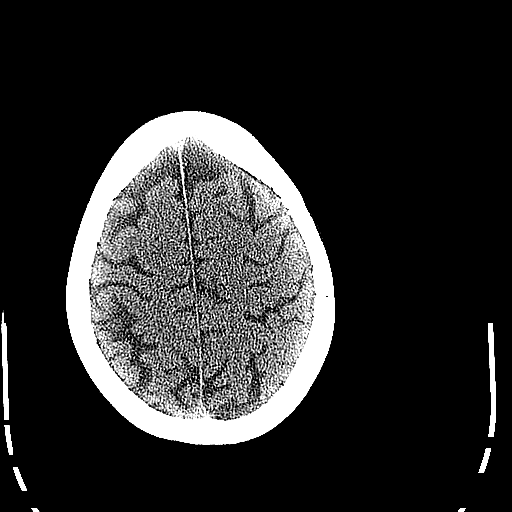
[im 46/72  bone]
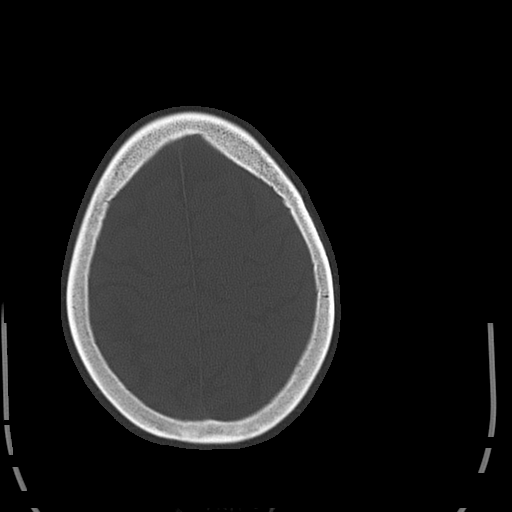
[im 51/72  brain]
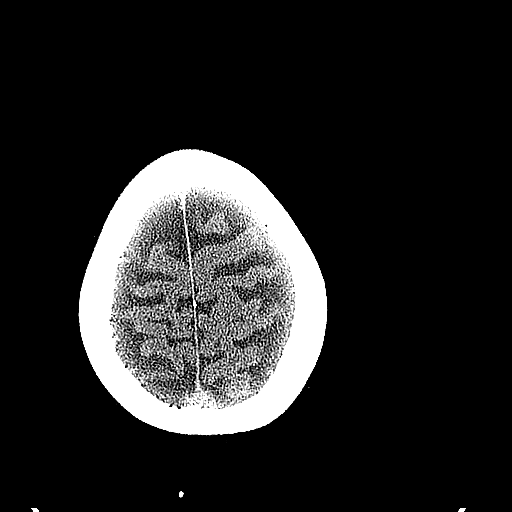
[im 56/72  brain]
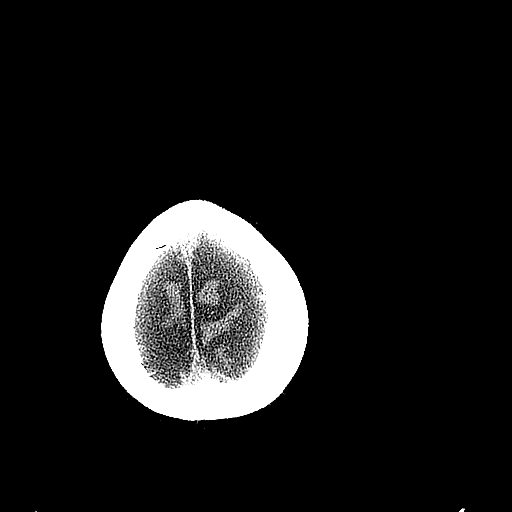
[im 61/72  brain]
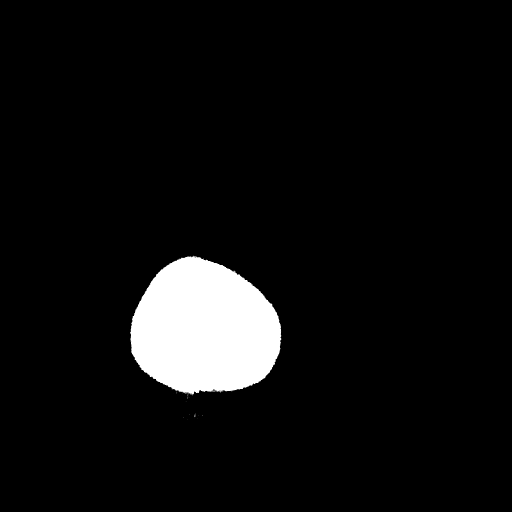
[im 66/72  brain]
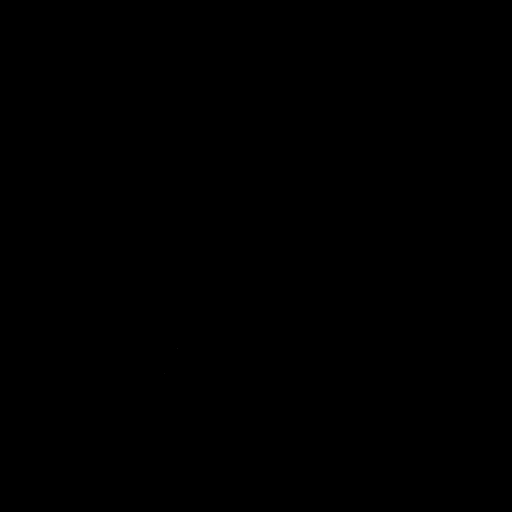
[im 66/72  bone]
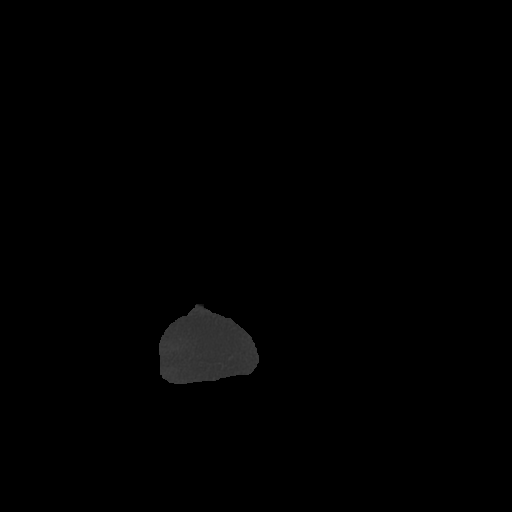

[Series 5: headtrauma 2.4 h60s · axial · 0.46mm/px · z∈[+141,+156]mm · 2 of 24 slices shown (2 of 2)]
[im 6/24  brain]
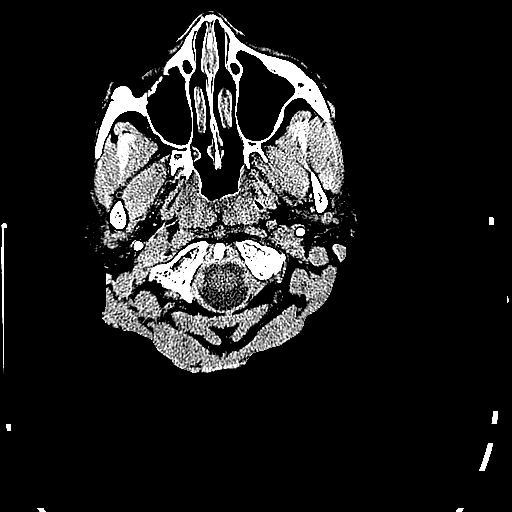
[im 12/24  brain]
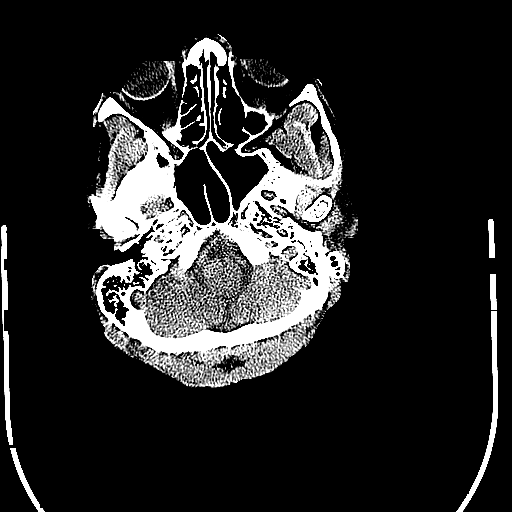

[15 of 30 positions shown; findings below may reference images not displayed]

FINDINGS: The ventricles are normal in size and configuration. There is mild
cerebellar atrophy. There is no intracranial mass, hemorrhage,
extra-axial fluid collection, or midline shift. The gray-white
compartments appear normal. No acute infarct evident. The middle
cerebral artery attenuation is symmetric and normal bilaterally. The
bony calvarium appears intact. The mastoid air cells are clear. No
intraorbital lesions are identified.
IMPRESSION: Mild cerebellar atrophy. No intracranial mass, hemorrhage, or edema.
No acute infarct evident.

Critical Value/emergent results were called by telephone at the time
of interpretation on 08/09/2015 at [DATE] to Dr. SCHMIDIG MINTEH ,
who verbally acknowledged these results.

## 2016-02-04 IMAGING — CT CT ANGIO HEAD
1 of 7 series · 2 of 33 positions shown · IV contrast (Omnipaque 300)
Comparison: Head CT without contrast 1188 hours today. Cervical
spine MRI 03/19/2004.

CLINICAL DATA: 59-year-old female with right side double vision and
dizziness since 9819 hours. Code stroke. Initial encounter.

EXAM:
CT ANGIOGRAPHY HEAD AND NECK
TECHNIQUE: Multidetector CT imaging of the head and neck was performed using
the standard protocol during bolus administration of intravenous
contrast. Multiplanar CT image reconstructions and MIPs were
obtained to evaluate the vascular anatomy. Carotid stenosis
measurements (when applicable) are obtained utilizing NASCET
criteria, using the distal internal carotid diameter as the
denominator.
CONTRAST:  100mL OMNIPAQUE IOHEXOL 350 MG/ML SOLN, 100mL OMNIPAQUE
IOHEXOL 350 MG/ML SOLN

[Series 4: cta neck cp portal/pac · axial · portal-venous · 0.52mm/px · z∈[+152,+252]mm · 2 of 150 slices shown]
[im 50/150  soft-tissue]
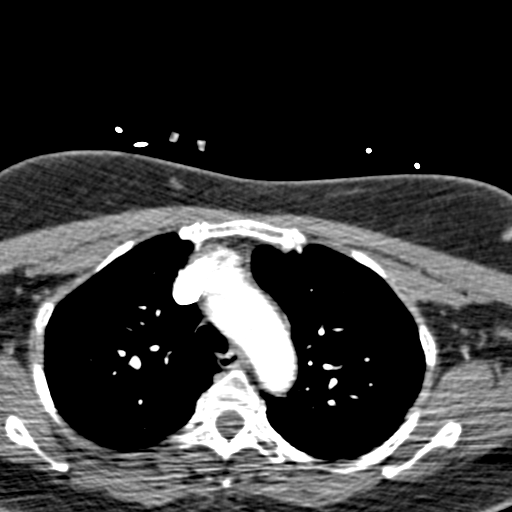
[im 100/150  bone]
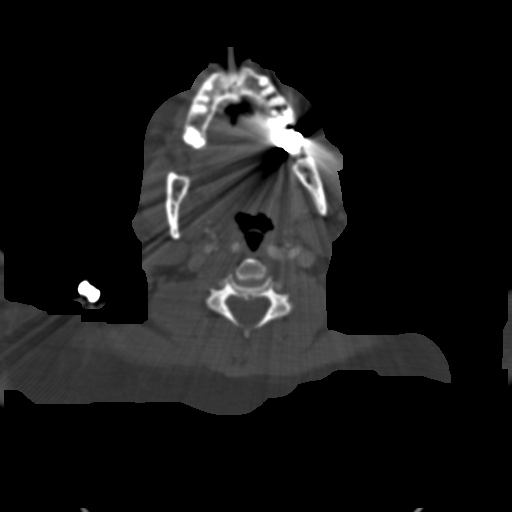

[2 of 33 positions shown; findings below may reference images not displayed]

FINDINGS: CTA NECK

Skeleton: Previous C6-C7 ACDF. Degenerative osseous changes
throughout the cervical spine. No acute osseous abnormality
identified. Visualized paranasal sinuses and mastoids are clear.

Other neck: Cardiomegaly. No pericardial effusion. Mild dependent
pulmonary atelectasis. No mediastinal lymphadenopathy.

Thyroid, larynx, pharynx, parapharyngeal spaces, retropharyngeal
space (retropharyngeal course of both carotids), sublingual space,
submandibular glands, parotid glands, and orbits soft tissues are
within normal limits. No cervical lymphadenopathy.

Aortic arch: 3 vessel arch configuration. Minimal arch
atherosclerosis. No great vessel origin stenosis.

Right carotid system: Tortuous brachiocephalic artery. Normal right
CCA origin. Tortuous proximal right CCA. Mild calcified plaque at
the medial right carotid bifurcation. No right ICA origin or bulb
stenosis. Partially retropharyngeal course of the cervical right ICA
which otherwise appears normal to the skullbase.

Left carotid system: Normal left CCA origin. Tortuous proximal left
CCA with a an acutely kinked appearance (series 4, image 63).
Negative left carotid bifurcation. Retropharyngeal course of the
proximal left ICA. No left ICA atherosclerosis or stenosis in the
neck.

Vertebral arteries:No proximal right subclavian artery stenosis.
Normal right vertebral artery origin (series 610, image 55). No
right vertebral artery stenosis identified to the skullbase. No
proximal left subclavian stenosis. Tortuous proximal left subclavian
at the thoracic inlet. Normal left vertebral artery origin. Tortuous
left V1 segment. No cervical left vertebral artery stenosis.

CTA HEAD

Posterior circulation: Codominant distal vertebral arteries are
patent without stenosis. PICA origins and vertebrobasilar junction
are patent. No basilar artery stenosis. SCA and PCA origins are
normal. Posterior communicating arteries are diminutive or absent.
Bilateral PCA branches are within normal limits.

Anterior circulation: Both ICA siphons are patent. Calcified
cavernous segment plaque greater on the left. No hemodynamically
significant siphon stenosis. Ophthalmic artery origins are within
normal limits. Carotid termini carotid termini are patent.

Dominant right ACA A1 segment, the left is diminutive. Anterior
communicating artery and bilateral ACA branches are within normal
limits. Bilateral MCA M1 segments are patent. Left M1 segment, left
MCA bifurcation, and left MCA branches are within normal limits.
Right M1 segment, MCA bifurcation, and right MCA branches are within
normal limits.

Venous sinuses: Patent.

Anatomic variants: Dominant right ACA A1 segment.

Delayed phase: No abnormal enhancement identified. No cortically
based acute infarct identified.
IMPRESSION: 1. Negative intracranial CTA, with no emergent large vessel
occlusion.
2. Very mild carotid atherosclerosis at the right carotid
bifurcation and in both ICA siphons. No associated stenosis.
3. No vertebral artery stenosis.
4. Stable and negative CT appearance of the brain.
5. Cardiomegaly.  Cervical spine degeneration, previous C6-C7 ACDF.

## 2016-02-09 DIAGNOSIS — K5900 Constipation, unspecified: Secondary | ICD-10-CM | POA: Diagnosis not present

## 2016-02-09 DIAGNOSIS — K5902 Outlet dysfunction constipation: Secondary | ICD-10-CM | POA: Diagnosis not present

## 2016-02-09 DIAGNOSIS — M25651 Stiffness of right hip, not elsewhere classified: Secondary | ICD-10-CM | POA: Diagnosis not present

## 2016-02-09 DIAGNOSIS — N3941 Urge incontinence: Secondary | ICD-10-CM | POA: Diagnosis not present

## 2016-02-09 DIAGNOSIS — K5909 Other constipation: Secondary | ICD-10-CM | POA: Diagnosis not present

## 2016-02-09 DIAGNOSIS — R198 Other specified symptoms and signs involving the digestive system and abdomen: Secondary | ICD-10-CM | POA: Diagnosis not present

## 2016-02-19 DIAGNOSIS — K581 Irritable bowel syndrome with constipation: Secondary | ICD-10-CM | POA: Diagnosis not present

## 2016-02-19 DIAGNOSIS — R198 Other specified symptoms and signs involving the digestive system and abdomen: Secondary | ICD-10-CM | POA: Diagnosis not present

## 2016-02-19 DIAGNOSIS — C20 Malignant neoplasm of rectum: Secondary | ICD-10-CM | POA: Diagnosis not present

## 2016-02-19 DIAGNOSIS — K5909 Other constipation: Secondary | ICD-10-CM | POA: Diagnosis not present

## 2016-02-19 DIAGNOSIS — K5902 Outlet dysfunction constipation: Secondary | ICD-10-CM | POA: Diagnosis not present

## 2016-02-19 DIAGNOSIS — M25651 Stiffness of right hip, not elsewhere classified: Secondary | ICD-10-CM | POA: Diagnosis not present

## 2016-02-19 DIAGNOSIS — N3941 Urge incontinence: Secondary | ICD-10-CM | POA: Diagnosis not present

## 2016-02-19 DIAGNOSIS — K5900 Constipation, unspecified: Secondary | ICD-10-CM | POA: Diagnosis not present

## 2016-02-19 DIAGNOSIS — Z85048 Personal history of other malignant neoplasm of rectum, rectosigmoid junction, and anus: Secondary | ICD-10-CM | POA: Diagnosis not present

## 2016-03-11 DIAGNOSIS — R569 Unspecified convulsions: Secondary | ICD-10-CM | POA: Diagnosis not present

## 2016-03-11 DIAGNOSIS — F5101 Primary insomnia: Secondary | ICD-10-CM | POA: Diagnosis not present

## 2016-03-11 DIAGNOSIS — E119 Type 2 diabetes mellitus without complications: Secondary | ICD-10-CM | POA: Diagnosis not present

## 2016-03-11 DIAGNOSIS — G40219 Localization-related (focal) (partial) symptomatic epilepsy and epileptic syndromes with complex partial seizures, intractable, without status epilepticus: Secondary | ICD-10-CM | POA: Diagnosis not present

## 2016-03-11 DIAGNOSIS — R2689 Other abnormalities of gait and mobility: Secondary | ICD-10-CM | POA: Diagnosis not present

## 2016-03-11 DIAGNOSIS — R42 Dizziness and giddiness: Secondary | ICD-10-CM | POA: Diagnosis not present

## 2016-03-11 DIAGNOSIS — R4182 Altered mental status, unspecified: Secondary | ICD-10-CM | POA: Diagnosis not present

## 2016-03-11 DIAGNOSIS — G459 Transient cerebral ischemic attack, unspecified: Secondary | ICD-10-CM | POA: Diagnosis not present

## 2016-03-11 DIAGNOSIS — G4733 Obstructive sleep apnea (adult) (pediatric): Secondary | ICD-10-CM | POA: Diagnosis not present

## 2016-05-05 ENCOUNTER — Telehealth: Payer: Self-pay | Admitting: Internal Medicine

## 2016-05-05 NOTE — Telephone Encounter (Signed)
Pt takes Linzess 290 and has diarrhea every day. Please advise and call [phone]

## 2016-05-06 NOTE — Telephone Encounter (Signed)
Spoke with the pt, she said she was going to therapy at [facility] and it really wasn't doing anything to help her and she quit going because she didn't have a ride. She went 9 days without a bm and was still taking the linzess. For the past two weeks she has had a lot of diarrhea everyday. She has not been around anyone sick or been on Abx recently. She didn't take the linzess 290 today and has not had a bm. She said she will take it tomorrow and have diarrhea all day. And she is bloated. She wants to know what she can do?

## 2016-05-07 NOTE — Telephone Encounter (Signed)
We are out of linzess 156mcg

## 2016-05-07 NOTE — Telephone Encounter (Signed)
For now until she can pick up samples of 145 mcg, she can open up the capsule and sprinkle half of it in applesauce. Would leave 145 mcg at the front desk for her. See how she does with this. If this works, can send in prescription for [place] 145.

## 2016-05-10 DIAGNOSIS — E785 Hyperlipidemia, unspecified: Secondary | ICD-10-CM | POA: Diagnosis not present

## 2016-05-10 DIAGNOSIS — R42 Dizziness and giddiness: Secondary | ICD-10-CM | POA: Diagnosis not present

## 2016-05-10 DIAGNOSIS — F319 Bipolar disorder, unspecified: Secondary | ICD-10-CM | POA: Diagnosis not present

## 2016-05-10 DIAGNOSIS — Z79899 Other long term (current) drug therapy: Secondary | ICD-10-CM | POA: Diagnosis not present

## 2016-05-10 DIAGNOSIS — G459 Transient cerebral ischemic attack, unspecified: Secondary | ICD-10-CM | POA: Diagnosis not present

## 2016-05-10 DIAGNOSIS — K219 Gastro-esophageal reflux disease without esophagitis: Secondary | ICD-10-CM | POA: Diagnosis not present

## 2016-05-10 DIAGNOSIS — E119 Type 2 diabetes mellitus without complications: Secondary | ICD-10-CM | POA: Diagnosis not present

## 2016-05-10 DIAGNOSIS — R4182 Altered mental status, unspecified: Secondary | ICD-10-CM | POA: Diagnosis not present

## 2016-05-10 DIAGNOSIS — G40219 Localization-related (focal) (partial) symptomatic epilepsy and epileptic syndromes with complex partial seizures, intractable, without status epilepticus: Secondary | ICD-10-CM | POA: Diagnosis not present

## 2016-05-10 DIAGNOSIS — F5101 Primary insomnia: Secondary | ICD-10-CM | POA: Diagnosis not present

## 2016-05-10 DIAGNOSIS — R2689 Other abnormalities of gait and mobility: Secondary | ICD-10-CM | POA: Diagnosis not present

## 2016-05-10 DIAGNOSIS — I10 Essential (primary) hypertension: Secondary | ICD-10-CM | POA: Diagnosis not present

## 2016-05-10 DIAGNOSIS — L438 Other lichen planus: Secondary | ICD-10-CM | POA: Diagnosis not present

## 2016-05-10 NOTE — Telephone Encounter (Signed)
Linda English just brought samples today.

## 2016-05-11 NOTE — Telephone Encounter (Signed)
Pt is aware, samples are at the front desk.  

## 2016-05-17 DIAGNOSIS — E1149 Type 2 diabetes mellitus with other diabetic neurological complication: Secondary | ICD-10-CM | POA: Diagnosis not present

## 2016-05-17 DIAGNOSIS — I10 Essential (primary) hypertension: Secondary | ICD-10-CM | POA: Diagnosis not present

## 2016-05-17 DIAGNOSIS — Z23 Encounter for immunization: Secondary | ICD-10-CM | POA: Diagnosis not present

## 2016-06-24 ENCOUNTER — Encounter: Payer: Self-pay | Admitting: Gastroenterology

## 2016-06-24 ENCOUNTER — Ambulatory Visit (INDEPENDENT_AMBULATORY_CARE_PROVIDER_SITE_OTHER): Payer: Medicare Other | Admitting: Gastroenterology

## 2016-06-24 VITALS — BP 152/84 | HR 81 | Temp 97.9°F | Ht 62.0 in | Wt 161.2 lb

## 2016-06-24 DIAGNOSIS — K5902 Outlet dysfunction constipation: Secondary | ICD-10-CM | POA: Diagnosis not present

## 2016-06-24 NOTE — Progress Notes (Signed)
Referring Provider: [name], MD Primary Care Physician:  [name], MD Primary GI: Dr. Gala Romney   Chief Complaint  Patient presents with  . Constipation    has diarrhea after taking Linzess    HPI:   Linda English is a 60 y.o. female presenting today with a history of constipation, rectal cancer s/p resection in 2003. Colonoscopy on 01/15/2015 showed normal residual rectum and colon. Surgical anastomosis at approximately 5 cm from the anal verge. Repeat surveillance colonoscopy in 5 years. 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 She underwent at least 7 sessions of biofeedback retraining. [facility] recommended Linzess every other day or three times a week.   145 and 290 causes diarrhea. Wants to try every other day Linzess but has actually had days where she hasn't taken it because she is going somewhere.   Past Medical History:  Diagnosis Date  . Acid reflux   . Allergic rhinitis   . Anxiety   . Arthritis    osteoarthritis  . Bipolar disorder ([place])   . Chronic headache   . Depression   . Diverticula of colon   . DM (diabetes mellitus) ([place])   . Gastric erosions   . Gastric polyps    benign   . Hemorrhoids   . Hiatal hernia   . Hypertension   . IBS (irritable bowel syndrome)   . Lichen planus   . Obstructive sleep apnea   . Panic disorder   . Rectal cancer ([place]) 2003   ileostomy and reversal  . Tubular adenoma     Past Surgical History:  Procedure Laterality Date  . ABDOMINAL HYSTERECTOMY    . APPENDECTOMY    . BACK SURGERY    . CESAREAN SECTION     X  2  . CHOLECYSTECTOMY    . COLONOSCOPY  08/2007   DR Gala Romney, friable anal canal hemorrhoids, surgical anastomosis at 3cm, distal scattered tics  . COLONOSCOPY  12/15/2010   anal papilla and internal hemorrhoids/diminutive polyp in the base of the cecum. Past, tubular adenoma.. Next colonoscopy due in April 2017.  Marland Kitchen COLONOSCOPY   10/27/2005   RMR: Anal canal hemorrhoids. Surgical anastomosis at 3 cm from the anal verge appeared normal. Few scattered distal diverticula. The residual  colonic mucosa appeared normal. I suspect the patient bled from hemorrhoids  . COLONOSCOPY N/A 01/15/2015   [id number] residual rectum and colon. next tcs 12/2019  . ESOPHAGEAL DILATION N/A 06/27/2015   Procedure: ESOPHAGEAL DILATION;  Surgeon: Daneil Dolin, MD;  [facility];  Service: Endoscopy;  Laterality: N/A;  . ESOPHAGOGASTRODUODENOSCOPY  05/2002   DR ROURK, normal  . ESOPHAGOGASTRODUODENOSCOPY  08/03/2011   RMR: small HH/ gastric polyps  . ESOPHAGOGASTRODUODENOSCOPY N/A 06/27/2015   Dr. Gala Romney: Mild erosive reflux esophagitits. Status post passage of a Maloney dilator. Hiatal hernia. Gastric polyps and abnormal gastric mucosa of doubtful clinical significane. status post biopsy, benign fundic gland polyp, negative H.pylori  . low anterior rection  2003   RECTAL CANCER  . SPINE SURGERY  2001   L5,S1 HEMILAMINOTOMY AND DISCECTOMY/DR DEATON    Current Outpatient Prescriptions  Medication Sig Dispense Refill  . amLODipine-benazepril (LOTREL) 5-20 MG per capsule Take 2 capsules by mouth daily.    Marland Kitchen aspirin 325 MG tablet Take 1 tablet (325 mg total) by mouth daily. 30 tablet 12  . atorvastatin (LIPITOR) 10 MG tablet Take 10 mg by mouth at bedtime.    Marland Kitchen  carbamazepine (TEGRETOL XR) 200 MG 12 hr tablet Take 600 mg by mouth at bedtime.     . Cholecalciferol (VITAMIN D3) 50000 units CAPS Take 1 capsule by mouth once a week.  0  . clonazePAM (KLONOPIN) 1 MG tablet Take 1 mg by mouth 4 (four) times daily as needed for anxiety.    . lamoTRIgine (LAMICTAL) 200 MG tablet Take 200 mg by mouth 2 (two) times daily.     Marland Kitchen LATUDA 60 MG TABS Take 60 mg by mouth at bedtime.   0  . levETIRAcetam (KEPPRA) 500 MG tablet Take 500 mg by mouth 2 (two) times daily.   1  . linaclotide (LINZESS) 290 MCG CAPS capsule Take 1 capsule (290 mcg total) by  mouth daily before breakfast. 90 capsule 3  . metFORMIN (GLUCOPHAGE-XR) 500 MG 24 hr tablet Take 500 mg by mouth 2 (two) times daily.    . ondansetron (ZOFRAN) 4 MG tablet Take 1 tablet (4 mg total) by mouth every 8 (eight) hours as needed for nausea or vomiting. 30 tablet 1  . OVER THE COUNTER MEDICATION Take by mouth at bedtime as needed (for sleep). OTC Zquill takes prn    . pantoprazole (PROTONIX) 40 MG tablet TAKE 1 TABLET BY MOUTH TWICE DAILY 60 tablet 5  . ranitidine (ZANTAC) 150 MG tablet TAKE 1 TABLET BY MOUTH TWICE DAILY 60 tablet 11  . VESICARE 5 MG tablet TK 1 T PO QD IN THE MORNING  12  . polyethylene glycol (MIRALAX / GLYCOLAX) packet Take 17 g by mouth 2 (two) times daily.     No current facility-administered medications for this visit.     Allergies as of 06/24/2016 - Review Complete 06/24/2016  Allergen Reaction Noted  . Lithium Nausea And Vomiting 06/27/2015  . Excedrin extra strength [aspirin-acetaminophen-caffeine] Other (See Comments) 10/28/2014    Family History  Problem Relation Age of Onset  . Colon cancer Paternal Grandfather 28  . Colon polyps Brother   . Colon polyps Cousin     Social History   Social History  . Marital status: Divorced    Spouse name: N/A  . Number of children: N/A  . Years of education: N/A   Occupational History  . teacher Retired    [employer]   Social History Main Topics  . Smoking status: Never Smoker  . Smokeless tobacco: Never Used     Comment: Never smoked  . Alcohol use No  . Drug use: No  . Sexual activity: No   Other Topics Concern  . None   Social History Narrative  . None    Review of Systems: As mentioned in HPI   Physical Exam: BP (!) 152/84   Pulse 81   Temp 97.9 F (36.6 C) (Oral)   Ht 5\' 2"  (1.575 m)   Wt 161 lb 3.2 oz (73.1 kg)   BMI 29.48 kg/m  General:   Alert and oriented. No distress noted. Pleasant and cooperative.  Head:  Normocephalic and atraumatic. Eyes:  Conjuctiva clear  without scleral icterus. Abdomen:  +BS, soft, non-tender and non-distended. No rebound or guarding. No HSM or masses noted. Msk:  Symmetrical without gross deformities. Normal posture. Extremities:  Without edema. Neurologic:  Alert and  oriented x4;  grossly normal neurologically. Psych:  Alert and cooperative. Normal mood and affect.

## 2016-06-24 NOTE — Patient Instructions (Signed)
I am giving you samples of Amitiza to try. I have 2 different dosages, the 8 mcg and 24 mcg. Start with the 8 mcg and take once each evening with food (to avoid nausea). You can increase this to twice a day with food if needed. If this is still not strong enough, you may use the 24 mcg dosage.   We will see you in 3 months!

## 2016-06-24 NOTE — Assessment & Plan Note (Signed)
With pelvic floor dysfunction, evaluated at [facility] and undergoing biofeedback retraining. She notes improvement with this, but Linzess tends to overshoot the mark. Although she has noted nausea with Amitiza in the remote past, I would like to try this again with the novel approach of taking at night with dinner. I have given her samples of 8 mcg and 24 mcg. She will return in 3 months. She may take once a day in the evening to start, and increase to BID if needed. Historically, it has been difficult to manage constipation. I feel she will do better with Amitiza with perhaps a once a day dosing. She will call with an update.

## 2016-06-24 NOTE — Progress Notes (Signed)
cc'ed to pcp °

## 2016-06-28 ENCOUNTER — Telehealth: Payer: Self-pay | Admitting: Internal Medicine

## 2016-06-28 NOTE — Telephone Encounter (Signed)
Routing to AB.  

## 2016-06-28 NOTE — Telephone Encounter (Signed)
I gave her the Amitiza to try. Maybe she is referring to that? If that is what she is talking about, then stop the Amitiza and go back to Linzess taking it every other day.

## 2016-06-28 NOTE — Telephone Encounter (Signed)
Pt called to say that Linzess is making her back hurt and she isn't able to have a BM. Please call [phone]

## 2016-06-29 NOTE — Telephone Encounter (Signed)
Sounds good

## 2016-06-29 NOTE — Telephone Encounter (Signed)
Spoke with the pt- she is referring to the [place]. She has only been taking it once a day. She said last night she took 2 and had a small bm and feels a little better now. She said her back pain is related to her constipation.   I went over her instructions from her ov and told her that she can increase it to twice a day. She said she didn't remember that. She is on the 8 mcg right now and will increase it to bid. Then she will try the 24 mcg and see how that works for her and will call me back and let me know how that works for her.

## 2016-07-05 MED ORDER — LUBIPROSTONE 24 MCG PO CAPS: 24.0000 ug | ORAL_CAPSULE | Freq: Two times a day (BID) | ORAL | 3 refills | Status: DC

## 2016-07-05 NOTE — Telephone Encounter (Signed)
Completed.

## 2016-07-05 NOTE — Addendum Note (Signed)
Addended by: Annitta Needs on: 07/05/2016 04:55 PM   Modules accepted: Orders

## 2016-07-05 NOTE — Telephone Encounter (Signed)
Pt called and said the amitiza 64mcg once a day works well for her. She needs an rx sent in. I informed her that it would probably be sent in for bid in case she needed to take it bid on occasion. She said that would be perfect.

## 2016-07-20 ENCOUNTER — Telehealth: Payer: Self-pay

## 2016-07-20 NOTE — Telephone Encounter (Signed)
Pt called- pt does not remember talking to me on 07/05/16. She said the amitiza 42mcg is now causing diarrhea. She said when she doesn't take it she doesn't have a bm at all but when she does take it she has 7-8 episodes of diarrhea and they are not complete bm's. She is not sure what to do. Spoke with Vicente Males, pt can try Trulance. I have samples I am putting at the front desk for her.

## 2016-07-20 NOTE — Telephone Encounter (Signed)
Agreed -

## 2016-07-23 ENCOUNTER — Other Ambulatory Visit: Payer: Self-pay | Admitting: Gastroenterology

## 2016-08-09 DIAGNOSIS — R42 Dizziness and giddiness: Secondary | ICD-10-CM | POA: Diagnosis not present

## 2016-08-09 DIAGNOSIS — G5603 Carpal tunnel syndrome, bilateral upper limbs: Secondary | ICD-10-CM | POA: Diagnosis not present

## 2016-08-09 DIAGNOSIS — G40219 Localization-related (focal) (partial) symptomatic epilepsy and epileptic syndromes with complex partial seizures, intractable, without status epilepticus: Secondary | ICD-10-CM | POA: Diagnosis not present

## 2016-08-09 DIAGNOSIS — F5101 Primary insomnia: Secondary | ICD-10-CM | POA: Diagnosis not present

## 2016-08-09 DIAGNOSIS — H8110 Benign paroxysmal vertigo, unspecified ear: Secondary | ICD-10-CM | POA: Diagnosis not present

## 2016-08-09 DIAGNOSIS — G459 Transient cerebral ischemic attack, unspecified: Secondary | ICD-10-CM | POA: Diagnosis not present

## 2016-08-09 DIAGNOSIS — R2689 Other abnormalities of gait and mobility: Secondary | ICD-10-CM | POA: Diagnosis not present

## 2016-08-09 DIAGNOSIS — R569 Unspecified convulsions: Secondary | ICD-10-CM | POA: Diagnosis not present

## 2016-08-09 DIAGNOSIS — G4733 Obstructive sleep apnea (adult) (pediatric): Secondary | ICD-10-CM | POA: Diagnosis not present

## 2016-08-09 DIAGNOSIS — R4182 Altered mental status, unspecified: Secondary | ICD-10-CM | POA: Diagnosis not present

## 2016-08-09 DIAGNOSIS — E119 Type 2 diabetes mellitus without complications: Secondary | ICD-10-CM | POA: Diagnosis not present

## 2016-08-16 ENCOUNTER — Telehealth: Payer: Self-pay

## 2016-08-16 NOTE — Telephone Encounter (Signed)
Pt called about the Trulance. She said that she takes it around lunch time and then 2 hours after that she will have diarrhea. She states that she will go about 9 times. Is that normal with this medicine. Please advise

## 2016-08-16 NOTE — Telephone Encounter (Signed)
No, she shouldn't be having that much diarrhea. I recommend going back to our prior regimen.

## 2016-08-17 NOTE — Telephone Encounter (Signed)
Tried to call with no answer  

## 2016-08-17 NOTE — Telephone Encounter (Signed)
Pt is aware of what AB said 

## 2016-09-20 ENCOUNTER — Telehealth: Payer: Self-pay | Admitting: Internal Medicine

## 2016-09-20 NOTE — Telephone Encounter (Signed)
PATIENT CALLED AND STATED THE MEDICATION SHE IS TAKING IS NOT WORKING AND NEEDS TO KNOW WHAT OTHER OPTIONS SHE HAS

## 2016-09-21 NOTE — Telephone Encounter (Signed)
Tried to call pt- NA- LMOM 

## 2016-09-22 NOTE — Telephone Encounter (Signed)
Pt called back, left a voicemail. She said she was just going to discuss this when she comes in on 09/29/16 for her appt.

## 2016-09-29 ENCOUNTER — Encounter: Payer: Self-pay | Admitting: Gastroenterology

## 2016-09-29 ENCOUNTER — Ambulatory Visit (INDEPENDENT_AMBULATORY_CARE_PROVIDER_SITE_OTHER): Payer: Medicare Other | Admitting: Gastroenterology

## 2016-09-29 VITALS — BP 114/73 | HR 93 | Temp 98.2°F | Ht 61.0 in | Wt 157.2 lb

## 2016-09-29 DIAGNOSIS — K5900 Constipation, unspecified: Secondary | ICD-10-CM

## 2016-09-29 DIAGNOSIS — K219 Gastro-esophageal reflux disease without esophagitis: Secondary | ICD-10-CM | POA: Diagnosis not present

## 2016-09-29 NOTE — Patient Instructions (Addendum)
I am giving you samples of Amitiza 8 mcg. I want you to stop the 24 mcg dosage. Start Amitiza 8 mcg once each evening with food for 3 days. Then, if needed, you may increase to twice a day with food. You may always take Miralax as needed if you need extra help. If you have diarrhea, hold that day's dose of Amitiza. You can adjust this to make it work for you, as long as you are not taking more than two doses of Amitiza a day.  Stop Protonix. I have given you samples of Dexilant to try once a day, with or without food each morning. Let me know how this works.  If abdominal pain continues even though we have decreased the Amitiza dose, we will do a CT scan.  Return in 6 months. We may need to refer you back to [facility] if persistent issues with getting a good bowel regimen.    Food Choices for Gastroesophageal Reflux Disease, Adult When you have gastroesophageal reflux disease (GERD), the foods you eat and your eating habits are very important. Choosing the right foods can help ease your discomfort. What guidelines do I need to follow?  Choose fruits, vegetables, whole grains, and low-fat dairy products.  Choose low-fat meat, fish, and poultry.  Limit fats such as oils, salad dressings, butter, nuts, and avocado.  Keep a food diary. This helps you identify foods that cause symptoms.  Avoid foods that cause symptoms. These may be different for everyone.  Eat small meals often instead of 3 large meals a day.  Eat your meals slowly, in a place where you are relaxed.  Limit fried foods.  Cook foods using methods other than frying.  Avoid drinking alcohol.  Avoid drinking large amounts of liquids with your meals.  Avoid bending over or lying down until 2-3 hours after eating. What foods are not recommended? These are some foods and drinks that may make your symptoms worse: Vegetables  Tomatoes. Tomato juice. Tomato and spaghetti sauce. Chili peppers. Onion and garlic.  Horseradish. Fruits  Oranges, grapefruit, and lemon (fruit and juice). Meats  High-fat meats, fish, and poultry. This includes hot dogs, ribs, ham, sausage, salami, and bacon. Dairy  Whole milk and chocolate milk. Sour cream. Cream. Butter. Ice cream. Cream cheese. Drinks  Coffee and tea. Bubbly (carbonated) drinks or energy drinks. Condiments  Hot sauce. Barbecue sauce. Sweets/Desserts  Chocolate and cocoa. Donuts. Peppermint and spearmint. Fats and Oils  High-fat foods. This includes [place] fries and potato chips. Other  Vinegar. Strong spices. This includes black pepper, white pepper, red pepper, cayenne, curry powder, cloves, ginger, and chili powder. The items listed above may not be a complete list of foods and drinks to avoid. Contact your dietitian for more information.  This information is not intended to replace advice given to you by your health care provider. Make sure you discuss any questions you have with your health care provider. Document Released: 02/15/2012 Document Revised: 01/22/2016 Document Reviewed: 06/20/2013 Elsevier Interactive Patient Education  2017 [employer].

## 2016-09-29 NOTE — Assessment & Plan Note (Addendum)
With pelvic floor dysfunction. Evaluated at [facility] and undergoing biofeedback training. We continue to struggle finding the best bowel regimen for her. It appears that a medication will work, then she will have diarrhea and fear taking it again. However, the best results have been with Amitiza, and I discussed how she can tailor this to work for her. As the 24 mcg once a day seem to cause abdominal cramping and looser stool, will trial 8 mcg daily and titrate slowly up to BID. Discussed novel approach of taking once or BID, depending on how she is feeling. Discussed holding medication if a day with diarrhea. She is somewhat of a poor historian, so this has been a challenge. Return in 6 months. May need referral [facility] as we have maximized therapy here. As of note, abdominal pain likely secondary to constipation. If persists despite changing Amitiza dosing, will pursue CT.

## 2016-09-29 NOTE — Assessment & Plan Note (Signed)
Stop Protonix BID and trial Dexilant. Zantac only as needed. GERD dietary and behavior modification discussed.

## 2016-09-29 NOTE — Progress Notes (Signed)
Referring Provider: [name], MD Primary Care Physician:  [name], MD Primary GI: Dr. Gala Romney   Chief Complaint  Patient presents with  . Constipation    Amitiza not working, goes 7-9 days without bm, after starts having bm then has diarrhea. Causes chest to hurt  . Abdominal Pain    left side abd and left side of back    HPI:   Linda English is a 61 y.o. female presenting today with a history of constipation, rectal cancer s/p resection in 2003. Colonoscopy on 01/15/2015 showed normal residual rectum and colon. Surgical anastomosis at approximately 5 cm from the anal verge. Repeat surveillance colonoscopy in 5 years. 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 She underwent at least 7 sessions of biofeedback retraining. [facility] recommended Linzess every other day or three times a week. It has been quite difficult to manage bowel regimen, as medications will work for some time and then result in diarrhea.   Linzess 145, 290 causes diarrhea. Amitiza 24 mcg once daily worked well at one point, then caused diarrhea. 8 mcg wasn't strong enough. Given trial of Trulance, with diarrhea. Protonix BID and having "quite a bit of reflux". Taking ranitidine BID. Believes she has tried Prilosec before. Sometimes eating late at night.   Was taking Amitiza 24 mcg BID and had diarrhea (as opposed to one as had worked before), then went back to once a day and didn't have a BM for at least 5-7 days. Would push out the hard stool and then have diarrhea. Hasn't tried the 8 mcg twice a day.   Abdominal pain is a "new thing". Upper abdomen "all the way down". Comes and goes. Links it with the Amitiza. If takes it early in the morning, stomach hurts.   Past Medical History:  Diagnosis Date  . Acid reflux   . Allergic rhinitis   . Anxiety   . Arthritis    osteoarthritis  . Bipolar disorder ([place])   . Chronic headache   .  Depression   . Diverticula of colon   . DM (diabetes mellitus) ([place])   . Gastric erosions   . Gastric polyps    benign   . Hemorrhoids   . Hiatal hernia   . Hypertension   . IBS (irritable bowel syndrome)   . Lichen planus   . Obstructive sleep apnea   . Panic disorder   . Rectal cancer ([place]) 2003   ileostomy and reversal  . Tubular adenoma     Past Surgical History:  Procedure Laterality Date  . ABDOMINAL HYSTERECTOMY    . APPENDECTOMY    . BACK SURGERY    . CESAREAN SECTION     X  2  . CHOLECYSTECTOMY    . COLONOSCOPY  08/2007   DR Gala Romney, friable anal canal hemorrhoids, surgical anastomosis at 3cm, distal scattered tics  . COLONOSCOPY  12/15/2010   anal papilla and internal hemorrhoids/diminutive polyp in the base of the cecum. Past, tubular adenoma.. Next colonoscopy due in April 2017.  Marland Kitchen COLONOSCOPY  10/27/2005   RMR: Anal canal hemorrhoids. Surgical anastomosis at 3 cm from the anal verge appeared normal. Few scattered distal diverticula. The residual  colonic mucosa appeared normal. I suspect the patient bled from hemorrhoids  . COLONOSCOPY N/A 01/15/2015   [id number] residual rectum and colon. next tcs 12/2019  . ESOPHAGEAL DILATION N/A 06/27/2015   Procedure: ESOPHAGEAL DILATION;  Surgeon: Daneil Dolin, MD;  [facility];  Service: Endoscopy;  Laterality: N/A;  . ESOPHAGOGASTRODUODENOSCOPY  05/2002   DR ROURK, normal  . ESOPHAGOGASTRODUODENOSCOPY  08/03/2011   RMR: small HH/ gastric polyps  . ESOPHAGOGASTRODUODENOSCOPY N/A 06/27/2015   Dr. Gala Romney: Mild erosive reflux esophagitits. Status post passage of a Maloney dilator. Hiatal hernia. Gastric polyps and abnormal gastric mucosa of doubtful clinical significane. status post biopsy, benign fundic gland polyp, negative H.pylori  . low anterior rection  2003   RECTAL CANCER  . SPINE SURGERY  2001   L5,S1 HEMILAMINOTOMY AND DISCECTOMY/DR DEATON    Current Outpatient Prescriptions  Medication Sig Dispense  Refill  . amLODipine-benazepril (LOTREL) 5-20 MG per capsule Take 2 capsules by mouth daily.    Marland Kitchen aspirin 325 MG tablet Take 1 tablet (325 mg total) by mouth daily. 30 tablet 12  . atorvastatin (LIPITOR) 10 MG tablet Take 10 mg by mouth at bedtime.    . carbamazepine (TEGRETOL XR) 200 MG 12 hr tablet Take 600 mg by mouth at bedtime.     . chlorthalidone (HYGROTON) 25 MG tablet Take 25 mg by mouth daily.  12  . Cholecalciferol (VITAMIN D3) 50000 units CAPS Take 1 capsule by mouth once a week.  0  . clonazePAM (KLONOPIN) 1 MG tablet Take 1 mg by mouth 2 (two) times daily.     Marland Kitchen lamoTRIgine (LAMICTAL) 200 MG tablet Take 200 mg by mouth 2 (two) times daily.     Marland Kitchen LATUDA 60 MG TABS Take 80 mg by mouth at bedtime.   0  . levETIRAcetam (KEPPRA) 500 MG tablet Take 500 mg by mouth 2 (two) times daily.   1  . lubiprostone (AMITIZA) 24 MCG capsule Take 1 capsule (24 mcg total) by mouth 2 (two) times daily with a meal. 60 capsule 3  . metFORMIN (GLUCOPHAGE-XR) 500 MG 24 hr tablet Take 500 mg by mouth 2 (two) times daily.    . ondansetron (ZOFRAN) 4 MG tablet Take 1 tablet (4 mg total) by mouth every 8 (eight) hours as needed for nausea or vomiting. 30 tablet 1  . pantoprazole (PROTONIX) 40 MG tablet TAKE 1 TABLET BY MOUTH TWICE DAILY 60 tablet 3  . ranitidine (ZANTAC) 150 MG tablet TAKE 1 TABLET BY MOUTH TWICE DAILY 60 tablet 11  . VESICARE 5 MG tablet TK 1 T PO QD IN THE MORNING  12  . linaclotide (LINZESS) 290 MCG CAPS capsule Take 1 capsule (290 mcg total) by mouth daily before breakfast. (Patient not taking: Reported on 09/29/2016) 90 capsule 3  . OVER THE COUNTER MEDICATION Take by mouth at bedtime as needed (for sleep). OTC Zquill takes prn    . polyethylene glycol (MIRALAX / GLYCOLAX) packet Take 17 g by mouth 2 (two) times daily.     No current facility-administered medications for this visit.     Allergies as of 09/29/2016 - Review Complete 09/29/2016  Allergen Reaction Noted  . Lithium Nausea  And Vomiting 06/27/2015  . Excedrin extra strength [aspirin-acetaminophen-caffeine] Other (See Comments) 10/28/2014    Family History  Problem Relation Age of Onset  . Colon cancer Paternal Grandfather 1  . Colon polyps Brother   . Colon polyps Cousin     Social History   Social History  . Marital status: Divorced    Spouse name: N/A  . Number of children: N/A  . Years of education: N/A   Occupational History  . teacher Retired    [employer]   Social History Main Topics  .  Smoking status: Never Smoker  . Smokeless tobacco: Never Used     Comment: Never smoked  . Alcohol use No  . Drug use: No  . Sexual activity: No   Other Topics Concern  . None   Social History Narrative  . None    Review of Systems: As mentioned in HPI.   Physical Exam: BP 114/73   Pulse 93   Temp 98.2 F (36.8 C) (Oral)   Ht 5\' 1"  (1.549 m)   Wt 157 lb 3.2 oz (71.3 kg)   BMI 29.70 kg/m  General:   Alert and oriented. No distress noted. Pleasant and cooperative.  Head:  Normocephalic and atraumatic. Eyes:  Conjuctiva clear without scleral icterus. Abdomen:  +BS, soft, non-tender and non-distended. No rebound or guarding. No HSM or masses noted. Msk:  Symmetrical without gross deformities. Normal posture. Extremities:  Without edema. Neurologic:  Alert and  oriented x4;  grossly normal neurologically. Psych:  Alert and cooperative. Normal mood and affect.

## 2016-09-30 NOTE — Progress Notes (Signed)
cc'ed to pcp °

## 2016-10-06 ENCOUNTER — Encounter (HOSPITAL_COMMUNITY): Payer: Self-pay | Admitting: Emergency Medicine

## 2016-10-06 ENCOUNTER — Telehealth: Payer: Self-pay

## 2016-10-06 ENCOUNTER — Emergency Department (HOSPITAL_COMMUNITY): Payer: Medicare Other

## 2016-10-06 ENCOUNTER — Emergency Department (HOSPITAL_COMMUNITY)
Admission: EM | Admit: 2016-10-06 | Discharge: 2016-10-06 | Disposition: A | Discharge status: Home / Self Care | Payer: Medicare Other | Attending: Emergency Medicine | Admitting: Emergency Medicine

## 2016-10-06 DIAGNOSIS — R072 Precordial pain: Secondary | ICD-10-CM | POA: Diagnosis not present

## 2016-10-06 DIAGNOSIS — E119 Type 2 diabetes mellitus without complications: Secondary | ICD-10-CM | POA: Insufficient documentation

## 2016-10-06 DIAGNOSIS — Z7984 Long term (current) use of oral hypoglycemic drugs: Secondary | ICD-10-CM | POA: Insufficient documentation

## 2016-10-06 DIAGNOSIS — Z85048 Personal history of other malignant neoplasm of rectum, rectosigmoid junction, and anus: Secondary | ICD-10-CM | POA: Diagnosis not present

## 2016-10-06 DIAGNOSIS — R079 Chest pain, unspecified: Secondary | ICD-10-CM

## 2016-10-06 DIAGNOSIS — Z79899 Other long term (current) drug therapy: Secondary | ICD-10-CM | POA: Diagnosis not present

## 2016-10-06 DIAGNOSIS — I10 Essential (primary) hypertension: Secondary | ICD-10-CM | POA: Diagnosis not present

## 2016-10-06 DIAGNOSIS — Z7982 Long term (current) use of aspirin: Secondary | ICD-10-CM | POA: Diagnosis not present

## 2016-10-06 LAB — BASIC METABOLIC PANEL
Anion gap: 12 (ref 5–15)
BUN: 24 mg/dL — AB (ref 6–20)
CHLORIDE: 96 mmol/L — AB (ref 101–111)
CO2: 29 mmol/L (ref 22–32)
CREATININE: 1.12 mg/dL — AB (ref 0.44–1.00)
Calcium: 10.1 mg/dL (ref 8.9–10.3)
GFR calc Af Amer: >60 mL/min (ref >60)
GFR calc non Af Amer: 53 mL/min — ABNORMAL LOW (ref >60)
GLUCOSE: 145 mg/dL — AB (ref 65–99)
POTASSIUM: 3.1 mmol/L — AB (ref 3.5–5.1)
Sodium: 137 mmol/L (ref 135–145)

## 2016-10-06 LAB — I-STAT TROPONIN, ED: Troponin i, poc: 0 ng/mL (ref 0.00–0.08)

## 2016-10-06 LAB — CBC
HEMATOCRIT: 39.5 % (ref 36.0–46.0)
Hemoglobin: 13 g/dL (ref 12.0–15.0)
MCH: 26.6 pg (ref 26.0–34.0)
MCHC: 32.9 g/dL (ref 30.0–36.0)
MCV: 80.8 fL (ref 78.0–100.0)
Platelets: 350 10*3/uL (ref 150–400)
RBC: 4.89 MIL/uL (ref 3.87–5.11)
RDW: 13.7 % (ref 11.5–15.5)
WBC: 6 10*3/uL (ref 4.0–10.5)

## 2016-10-06 LAB — D-DIMER, QUANTITATIVE: D-Dimer, Quant: 0.3 ug/mL-FEU (ref 0.00–0.50)

## 2016-10-06 LAB — TROPONIN I: Troponin I: <0.03 ng/mL (ref <0.03)

## 2016-10-06 MED ORDER — SUCRALFATE 1 GM/10ML PO SUSP: 1.0000 g | Freq: Three times a day (TID) | ORAL | 0 refills | Status: DC

## 2016-10-06 MED ORDER — GI COCKTAIL ~~LOC~~
30.0000 mL | Freq: Once | ORAL | Status: AC
  Administered 2016-10-06: 30 mL via ORAL
  Filled 2016-10-06: qty 30

## 2016-10-06 MED ORDER — SODIUM CHLORIDE 0.9 % IV BOLUS (SEPSIS)
1000.0000 mL | Freq: Once | INTRAVENOUS | Status: AC
  Administered 2016-10-06: 1000 mL via INTRAVENOUS

## 2016-10-06 NOTE — Telephone Encounter (Signed)
Pt is aware of what AB. She said that she was going to go to the ER.

## 2016-10-06 NOTE — ED Triage Notes (Signed)
PT states central chest pain (soreness) radiates into her neck and upper back x3 days.

## 2016-10-06 NOTE — Telephone Encounter (Signed)
Pt is calling because she is having back and chest pain(trouble breathing at times) since she started the Dexliant 3-5 days ago. She is having trouble sleeping also. The only thing that helps is Tums.Please advise

## 2016-10-06 NOTE — ED Provider Notes (Signed)
[facility] DEPT Provider Note   CSN: [id number] Arrival date & time: 10/06/16  1652     History   Chief Complaint Chief Complaint  Patient presents with  . Chest Pain    HPI Linda English is a 61 y.o. female.   Chest Pain   This is a new problem. The current episode started more than 2 days ago. The pain is present in the substernal region. The pain is at a severity of 5/10. The pain is severe. The quality of the pain is described as sharp. Pertinent negatives include no cough and no nausea. The treatment provided no relief. There are no known risk factors.    Past Medical History:  Diagnosis Date  . Acid reflux   . Allergic rhinitis   . Anxiety   . Arthritis    osteoarthritis  . Bipolar disorder ([place])   . Chronic headache   . Depression   . Diverticula of colon   . DM (diabetes mellitus) ([place])   . Gastric erosions   . Gastric polyps    benign   . Hemorrhoids   . Hiatal hernia   . Hypertension   . IBS (irritable bowel syndrome)   . Lichen planus   . Obstructive sleep apnea   . Panic disorder   . Rectal cancer ([place]) 2003   ileostomy and reversal  . Tubular adenoma     Patient Active Problem List   Diagnosis Date Noted  . Transient ischemic attack 08/09/2015  . Reflux esophagitis   . Gastric polyp   . Dyspepsia 06/13/2015  . Dysphagia 06/13/2015  . Incisional irritation 06/18/2013  . Wound drainage 06/18/2013  . Bipolar 1 disorder ([place]) 06/08/2012    Class: Chronic  . Anorexia 05/23/2012  . Insomnia 05/23/2012  . Weight loss 05/23/2012  . Weight loss, abnormal 04/10/2012  . Esophageal dysphagia 04/10/2012  . Nausea 09/08/2011  . GERD (gastroesophageal reflux disease) 07/14/2011  . Epigastric pain 07/14/2011  . Constipation 07/14/2011  . Knee pain 02/09/2011  . OA (osteoarthritis) of knee 02/09/2011  . Family hx of colon cancer 11/30/2010  . Bowel habit changes 11/30/2010  . OSTEOARTHRITIS, KNEE 08/05/2010  . PES PLANUS 08/05/2010    . RECTAL CANCER 10/23/2007  . Diabetes ([place]) 10/23/2007  . HYPERLIPIDEMIA 10/23/2007  . ANXIETY 10/23/2007  . PANIC DISORDER 10/23/2007  . DEPRESSION 10/23/2007  . OBSTRUCTIVE SLEEP APNEA 10/23/2007  . Essential hypertension 10/23/2007  . ALLERGIC RHINITIS 10/23/2007  . IBS 10/23/2007  . HEADACHE, CHRONIC 10/23/2007    Past Surgical History:  Procedure Laterality Date  . ABDOMINAL HYSTERECTOMY    . APPENDECTOMY    . BACK SURGERY    . CESAREAN SECTION     X  2  . CHOLECYSTECTOMY    . COLONOSCOPY  08/2007   DR Gala Romney, friable anal canal hemorrhoids, surgical anastomosis at 3cm, distal scattered tics  . COLONOSCOPY  12/15/2010   anal papilla and internal hemorrhoids/diminutive polyp in the base of the cecum. Past, tubular adenoma.. Next colonoscopy due in April 2017.  Marland Kitchen COLONOSCOPY  10/27/2005   RMR: Anal canal hemorrhoids. Surgical anastomosis at 3 cm from the anal verge appeared normal. Few scattered distal diverticula. The residual  colonic mucosa appeared normal. I suspect the patient bled from hemorrhoids  . COLONOSCOPY N/A 01/15/2015   [id number] residual rectum and colon. next tcs 12/2019  . ESOPHAGEAL DILATION N/A 06/27/2015   Procedure: ESOPHAGEAL DILATION;  Surgeon: Daneil Dolin, MD;  Location: [facility];  Service:  Endoscopy;  Laterality: N/A;  . ESOPHAGOGASTRODUODENOSCOPY  05/2002   DR ROURK, normal  . ESOPHAGOGASTRODUODENOSCOPY  08/03/2011   RMR: small HH/ gastric polyps  . ESOPHAGOGASTRODUODENOSCOPY N/A 06/27/2015   Dr. Gala Romney: Mild erosive reflux esophagitits. Status post passage of a Maloney dilator. Hiatal hernia. Gastric polyps and abnormal gastric mucosa of doubtful clinical significane. status post biopsy, benign fundic gland polyp, negative H.pylori  . low anterior rection  2003   RECTAL CANCER  . SPINE SURGERY  2001   L5,S1 HEMILAMINOTOMY AND DISCECTOMY/DR DEATON    OB History    Gravida Para Term Preterm AB Living             2   SAB TAB Ectopic  Multiple Live Births                   Home Medications    Prior to Admission medications   Medication Sig Start Date End Date Taking? Authorizing Provider  amLODipine-benazepril (LOTREL) 5-20 MG per capsule Take 2 capsules by mouth daily. 06/13/12  Yes Darrol Jump, MD  aspirin 325 MG tablet Take 1 tablet (325 mg total) by mouth daily. 08/11/15  Yes Sinda Du, MD  atorvastatin (LIPITOR) 10 MG tablet Take 10 mg by mouth at bedtime.   Yes Historical Provider, MD  carbamazepine (TEGRETOL XR) 200 MG 12 hr tablet Take 600 mg by mouth at bedtime.  05/30/13  Yes Historical Provider, MD  chlorthalidone (HYGROTON) 25 MG tablet Take 25 mg by mouth daily. 09/20/16  Yes Historical Provider, MD  Cholecalciferol (VITAMIN D3) 50000 units CAPS Take 1 capsule by mouth once a week. 05/10/16  Yes Historical Provider, MD  clonazePAM (KLONOPIN) 1 MG tablet Take 1 mg by mouth 2 (two) times daily.    Yes Historical Provider, MD  lamoTRIgine (LAMICTAL) 200 MG tablet Take 200 mg by mouth 2 (two) times daily.  06/11/13  Yes Historical Provider, MD  levETIRAcetam (KEPPRA) 500 MG tablet Take 500 mg by mouth 2 (two) times daily.  09/15/15  Yes Historical Provider, MD  lubiprostone (AMITIZA) 24 MCG capsule Take 1 capsule (24 mcg total) by mouth 2 (two) times daily with a meal. 07/05/16  Yes Annitta Needs, NP  metFORMIN (GLUCOPHAGE-XR) 500 MG 24 hr tablet Take 500 mg by mouth 2 (two) times daily. 06/13/12  Yes Darrol Jump, MD  ondansetron (ZOFRAN) 4 MG tablet Take 1 tablet (4 mg total) by mouth every 8 (eight) hours as needed for nausea or vomiting. 06/13/15  Yes Annitta Needs, NP  pantoprazole (PROTONIX) 40 MG tablet TAKE 1 TABLET BY MOUTH TWICE DAILY 07/27/16  Yes Annitta Needs, NP  ranitidine (ZANTAC) 150 MG tablet TAKE 1 TABLET BY MOUTH TWICE DAILY 01/05/16  Yes Mahala Menghini, PA-C  VESICARE 5 MG tablet Take one tablet daily 09/18/15  Yes Historical Provider, MD  sucralfate (CARAFATE) 1 GM/10ML suspension Take 10 mLs  (1 g total) by mouth 4 (four) times daily -  with meals and at bedtime. 10/06/16   Merrily Pew, MD    Family History Family History  Problem Relation Age of Onset  . Colon cancer Paternal Grandfather 18  . Colon polyps Brother   . Colon polyps Cousin     Social History Social History  Substance Use Topics  . Smoking status: Never Smoker  . Smokeless tobacco: Never Used     Comment: Never smoked  . Alcohol use No     Allergies   Lithium and Excedrin extra  strength [aspirin-acetaminophen-caffeine]   Review of Systems Review of Systems  Respiratory: Negative for cough.   Cardiovascular: Positive for chest pain.  Gastrointestinal: Negative for nausea.  All other systems reviewed and are negative.    Physical Exam Updated Vital Signs BP 120/80   Pulse 83   Temp 98 F (36.7 C) (Oral)   Resp 19   Ht 5\' 1"  (1.549 m)   Wt 157 lb (71.2 kg)   SpO2 99%   BMI 29.66 kg/m   Physical Exam  Constitutional: She is oriented to person, place, and time. She appears well-developed and well-nourished.  HENT:  Head: Normocephalic and atraumatic.  Eyes: Conjunctivae and EOM are normal.  Neck: Normal range of motion.  Cardiovascular: Normal rate and regular rhythm.   Pulmonary/Chest: No stridor. No respiratory distress.  Abdominal: She exhibits no distension.  Musculoskeletal: She exhibits no edema or deformity.  Neurological: She is alert and oriented to person, place, and time. No cranial nerve deficit.  Skin: Skin is warm and dry.  Nursing note and vitals reviewed.    ED Treatments / Results  Labs (all labs ordered are listed, but only abnormal results are displayed) Labs Reviewed  BASIC METABOLIC PANEL - Abnormal; Notable for the following:       Result Value   Potassium 3.1 (*)    Chloride 96 (*)    Glucose, Bld 145 (*)    BUN 24 (*)    Creatinine, Ser 1.12 (*)    GFR calc non Af Amer 53 (*)    All other components within normal limits  CBC  TROPONIN I    D-DIMER, QUANTITATIVE (NOT AT [facility])  Randolm Idol, ED    EKG  EKG Interpretation  Date/Time:  Wednesday October 06 2016 20:38:59 EST Ventricular Rate:  83 PR Interval:    QRS Duration: 100 QT Interval:  400 QTC Calculation: 470 R Axis:   14 Text Interpretation:  Sinus rhythm No significant change since last tracing Confirmed by [facility] MD, Corene Cornea [phone]) on 10/06/2016 9:20:58 PM       Radiology Dg Chest 2 View  Result Date: 10/06/2016 CLINICAL DATA:  Central chest pain radiating to the neck and upper back over 3 days EXAM: CHEST  2 VIEW COMPARISON:  Chest x-ray of 10/31/2013 FINDINGS: No active infiltrate or effusion is seen. Mediastinal and hilar contours are unremarkable. The heart is within normal limits in size. There are degenerative changes throughout the mid to lower thoracic spine. A lower anterior cervical spine fusion plate is present. IMPRESSION: No active cardiopulmonary disease. Electronically Signed   By: Ivar Drape M.D.   On: 10/06/2016 17:17    Procedures Procedures (including critical care time)  Medications Ordered in ED Medications  gi cocktail (Maalox,Lidocaine,Donnatal) (30 mLs Oral Given 10/06/16 1850)  sodium chloride 0.9 % bolus 1,000 mL (0 mLs Intravenous Stopped 10/06/16 2025)     Initial Impression / Assessment and Plan / ED Course  I have reviewed the triage vital signs and the nursing notes.  Pertinent labs & imaging results that were available during my care of the patient were reviewed by me and considered in my medical decision making (see chart for details).     Workup for CP unremarkable. Likely reflux related. Stable for dc w/ pcp/gi workup.   Final Clinical Impressions(s) / ED Diagnoses   Final diagnoses:  Chest pain, unspecified type    New Prescriptions Discharge Medication List as of 10/06/2016  9:11 PM    START taking these  medications   Details  sucralfate (CARAFATE) 1 GM/10ML suspension Take 10 mLs (1 g total) by mouth 4  (four) times daily -  with meals and at bedtime., Starting Wed 10/06/2016, Print         Merrily Pew, MD 10/06/16 2220

## 2016-10-06 NOTE — Telephone Encounter (Signed)
Stop the Dexilant. I don't think this is causing her symptoms. If she is having back, chest pain, trouble breathing, she needs to go to the ED to be evaluated ASAP. May resume the Protonix BID but she needs to get evaluated ASAP for any cardiopulmonary issues.

## 2016-10-14 ENCOUNTER — Telehealth: Payer: Self-pay | Admitting: Internal Medicine

## 2016-10-14 MED ORDER — LUBIPROSTONE 8 MCG PO CAPS: 24.0000 ug | ORAL_CAPSULE | Freq: Two times a day (BID) | ORAL | 2 refills | Status: DC

## 2016-10-14 NOTE — Telephone Encounter (Signed)
Routing to the refill box. She put her on Amitiza 41mcg.

## 2016-10-14 NOTE — Addendum Note (Signed)
Addended by: Gordy Levan, Moniqua Engebretsen A on: 10/14/2016 04:03 PM   Modules accepted: Orders

## 2016-10-14 NOTE — Telephone Encounter (Signed)
Rx sent to pharmacy   

## 2016-10-14 NOTE — Telephone Encounter (Signed)
Pt called to say she was out of samples of Amitiza and needed more or we could call in a prescription to [facility].

## 2016-12-09 ENCOUNTER — Other Ambulatory Visit: Payer: Self-pay | Admitting: Gastroenterology

## 2017-01-03 ENCOUNTER — Other Ambulatory Visit: Payer: Self-pay | Admitting: Gastroenterology

## 2017-01-03 ENCOUNTER — Encounter: Payer: Self-pay | Admitting: Gastroenterology

## 2017-02-25 ENCOUNTER — Other Ambulatory Visit: Payer: Self-pay | Admitting: Gastroenterology

## 2017-03-21 ENCOUNTER — Ambulatory Visit: Payer: 59 | Admitting: Gastroenterology

## 2017-03-29 ENCOUNTER — Encounter: Payer: Self-pay | Admitting: Gastroenterology

## 2017-03-29 ENCOUNTER — Ambulatory Visit (INDEPENDENT_AMBULATORY_CARE_PROVIDER_SITE_OTHER): Payer: Medicare Other | Admitting: Gastroenterology

## 2017-03-29 VITALS — BP 110/62 | HR 81 | Temp 98.1°F | Ht 62.0 in | Wt 152.6 lb

## 2017-03-29 DIAGNOSIS — K219 Gastro-esophageal reflux disease without esophagitis: Secondary | ICD-10-CM | POA: Diagnosis not present

## 2017-03-29 DIAGNOSIS — K5900 Constipation, unspecified: Secondary | ICD-10-CM | POA: Diagnosis not present

## 2017-03-29 NOTE — Progress Notes (Signed)
Referring Provider: [name], MD Primary Care Physician:  [name], MD Primary GI: Dr. Gala Romney   Chief Complaint  Patient presents with  . Gastroesophageal Reflux    HPI:   Linda English is a 61 y.o. female presenting today with a history of constipation, rectal cancer s/p resection in 2003. Colonoscopy on 01/15/2015 showed normal residual rectum and colon. Surgical anastomosis at approximately 5 cm from the anal verge. Repeat surveillance colonoscopy in 5 years. 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 She underwent at least 7 sessions of biofeedback retraining. [facility] recommended Linzess every other day or three times a week. It has been quite difficult to manage bowel regimen, as medications will work for some time and then result in diarrhea.   Linzess, Amitiza 24 mcg, and Trulance have not been helpful. Amitiza 8 mcg trialed at last visit. Dexilant provided at last visit. Stopped this because she started having chest pain and thought it was related to [place]. Now back on PPI BID.    Today: states she is much better with constipation and actually only taking Amitiza or Miralax as needed but is rarely. Protonix BID now. Intermittent reflux. Had bad reflux after steak with onions, mushrooms. Overall much improved.   Past Medical History:  Diagnosis Date  . Acid reflux   . Allergic rhinitis   . Anxiety   . Arthritis    osteoarthritis  . Bipolar disorder ([place])   . Chronic headache   . Depression   . Diverticula of colon   . DM (diabetes mellitus) ([place])   . Gastric erosions   . Gastric polyps    benign   . Hemorrhoids   . Hiatal hernia   . Hypertension   . IBS (irritable bowel syndrome)   . Lichen planus   . Obstructive sleep apnea   . Panic disorder   . Rectal cancer ([place]) 2003   ileostomy and reversal  . Tubular adenoma     Past Surgical History:  Procedure Laterality Date  .  ABDOMINAL HYSTERECTOMY    . APPENDECTOMY    . BACK SURGERY    . CESAREAN SECTION     X  2  . CHOLECYSTECTOMY    . COLONOSCOPY  08/2007   DR Gala Romney, friable anal canal hemorrhoids, surgical anastomosis at 3cm, distal scattered tics  . COLONOSCOPY  12/15/2010   anal papilla and internal hemorrhoids/diminutive polyp in the base of the cecum. Past, tubular adenoma.. Next colonoscopy due in April 2017.  Marland Kitchen COLONOSCOPY  10/27/2005   RMR: Anal canal hemorrhoids. Surgical anastomosis at 3 cm from the anal verge appeared normal. Few scattered distal diverticula. The residual  colonic mucosa appeared normal. I suspect the patient bled from hemorrhoids  . COLONOSCOPY N/A 01/15/2015   [id number] residual rectum and colon. next tcs 12/2019  . ESOPHAGEAL DILATION N/A 06/27/2015   Procedure: ESOPHAGEAL DILATION;  Surgeon: Daneil Dolin, MD;  Location: [facility]ITE;  Service: Endoscopy;  Laterality: N/A;  . ESOPHAGOGASTRODUODENOSCOPY  05/2002   DR ROURK, normal  . ESOPHAGOGASTRODUODENOSCOPY  08/03/2011   RMR: small HH/ gastric polyps  . ESOPHAGOGASTRODUODENOSCOPY N/A 06/27/2015   Dr. Gala Romney: Mild erosive reflux esophagitits. Status post passage of a Maloney dilator. Hiatal hernia. Gastric polyps and abnormal gastric mucosa of doubtful clinical significane. status post biopsy, benign fundic gland polyp, negative H.pylori  . low anterior rection  2003   RECTAL CANCER  . [place] SURGERY  2001  L5,S1 HEMILAMINOTOMY AND DISCECTOMY/DR DEATON    Current Outpatient Prescriptions  Medication Sig Dispense Refill  . amLODipine (NORVASC) 10 MG tablet Take 10 mg by mouth daily.    Marland Kitchen aspirin 325 MG tablet Take 1 tablet (325 mg total) by mouth daily. 30 tablet 12  . atorvastatin (LIPITOR) 10 MG tablet Take 10 mg by mouth at bedtime.    . benazepril (LOTENSIN) 20 MG tablet Take 20 mg by mouth daily.  4  . carbamazepine (TEGRETOL XR) 200 MG 12 hr tablet Take 200 mg by mouth at bedtime.     . chlorthalidone (HYGROTON) 25  MG tablet Take 25 mg by mouth daily.  12  . citalopram (CELEXA) 20 MG tablet Take 10 mg by mouth daily.  5  . clonazePAM (KLONOPIN) 1 MG tablet Take 1 mg by mouth 2 (two) times daily.     Marland Kitchen lamoTRIgine (LAMICTAL) 200 MG tablet Take 200 mg by mouth 2 (two) times daily.     Marland Kitchen levETIRAcetam (KEPPRA) 500 MG tablet Take 750 mg by mouth 2 (two) times daily.   1  . lubiprostone (AMITIZA) 8 MCG capsule Take 3 capsules (24 mcg total) by mouth 2 (two) times daily with a meal. (Patient taking differently: Take 24 mcg by mouth as needed. ) 60 capsule 2  . metFORMIN (GLUCOPHAGE-XR) 500 MG 24 hr tablet Take 500 mg by mouth 2 (two) times daily.    . ondansetron (ZOFRAN) 4 MG tablet Take 1 tablet (4 mg total) by mouth every 8 (eight) hours as needed for nausea or vomiting. 30 tablet 1  . pantoprazole (PROTONIX) 40 MG tablet TAKE 1 TABLET BY MOUTH TWICE DAILY 60 tablet 5  . PRESCRIPTION MEDICATION Vitamin D 25000 by mouth once a week    . ranitidine (ZANTAC) 150 MG tablet Take 1 tablet (150 mg total) by mouth 2 (two) times daily as needed. (Patient taking differently: Take 150 mg by mouth 2 (two) times daily. ) 60 tablet 5   No current facility-administered medications for this visit.     Allergies as of 03/29/2017 - Review Complete 03/29/2017  Allergen Reaction Noted  . Lithium Nausea And Vomiting 06/27/2015  . Excedrin extra strength [aspirin-acetaminophen-caffeine] Other (See Comments) 10/28/2014    Family History  Problem Relation Age of Onset  . Colon cancer Paternal Grandfather 65  . Colon polyps Brother   . Colon polyps Cousin     Social History   Social History  . Marital status: Divorced    Spouse name: N/A  . Number of children: N/A  . Years of education: N/A   Occupational History  . teacher Retired    [employer]   Social History Main Topics  . Smoking status: Never Smoker  . Smokeless tobacco: Never Used     Comment: Never smoked  . Alcohol use No  . Drug use: No  . Sexual  activity: No   Other Topics Concern  . None   Social History Narrative  . None    Review of Systems: As mentioned in HPI   Physical Exam: BP 110/62   Pulse 81   Temp 98.1 F (36.7 C) (Oral)   Ht 5\' 2"  (1.575 m)   Wt 152 lb 9.6 oz (69.2 kg)   BMI 27.91 kg/m  General:   Alert and oriented. No distress noted. Pleasant and cooperative.  Head:  Normocephalic and atraumatic. Eyes:  Conjuctiva clear without scleral icterus. Mouth:  Oral mucosa pink and moist. Good dentition. No lesions.  Abdomen:  +BS, soft, non-tender and non-distended. No rebound or guarding. No HSM or masses noted. Msk:  Symmetrical without gross deformities. Normal posture. Extremities:  Without edema. Neurologic:  Alert and  oriented x4;  grossly normal neurologically. Psych:  Alert and cooperative. Normal mood and affect.

## 2017-03-29 NOTE — Patient Instructions (Addendum)
Continue Protonix twice a day, 30 minutes before breakfast and dinner.   I have attached a reflux diet sheet for you.  We will see you in 6 months!   Food Choices for Gastroesophageal Reflux Disease, Adult When you have gastroesophageal reflux disease (GERD), the foods you eat and your eating habits are very important. Choosing the right foods can help ease your discomfort. What guidelines do I need to follow?  Choose fruits, vegetables, whole grains, and low-fat dairy products.  Choose low-fat meat, fish, and poultry.  Limit fats such as oils, salad dressings, butter, nuts, and avocado.  Keep a food diary. This helps you identify foods that cause symptoms.  Avoid foods that cause symptoms. These may be different for everyone.  Eat small meals often instead of 3 large meals a day.  Eat your meals slowly, in a place where you are relaxed.  Limit fried foods.  Cook foods using methods other than frying.  Avoid drinking alcohol.  Avoid drinking large amounts of liquids with your meals.  Avoid bending over or lying down until 2-3 hours after eating. What foods are not recommended? These are some foods and drinks that may make your symptoms worse: Vegetables Tomatoes. Tomato juice. Tomato and spaghetti sauce. Chili peppers. Onion and garlic. Horseradish. Fruits Oranges, grapefruit, and lemon (fruit and juice). Meats High-fat meats, fish, and poultry. This includes hot dogs, ribs, ham, sausage, salami, and bacon. Dairy Whole milk and chocolate milk. Sour cream. Cream. Butter. Ice cream. Cream cheese. Drinks Coffee and tea. Bubbly (carbonated) drinks or energy drinks. Condiments Hot sauce. Barbecue sauce. Sweets/Desserts Chocolate and cocoa. Donuts. Peppermint and spearmint. Fats and Oils High-fat foods. This includes [place] fries and potato chips. Other Vinegar. Strong spices. This includes black pepper, white pepper, red pepper, cayenne, curry powder, cloves, ginger,  and chili powder. The items listed above may not be a complete list of foods and drinks to avoid. Contact your dietitian for more information. This information is not intended to replace advice given to you by your health care provider. Make sure you discuss any questions you have with your health care provider. Document Released: 02/15/2012 Document Revised: 01/22/2016 Document Reviewed: 06/20/2013 Elsevier Interactive Patient Education  2017 [employer].

## 2017-04-01 NOTE — Assessment & Plan Note (Signed)
Continue Protonix BID. Dietary and behavior modification discussed. Return in 6 months.

## 2017-04-01 NOTE — Assessment & Plan Note (Signed)
With pelvic floor dysfunction. Doing quite well now with taking only Amitiza and Miralax prn. Much improved from a GI standpoint. Colonoscopy up-to-date. Return in 6 months.

## 2017-04-04 NOTE — Progress Notes (Signed)
CC'D TO PCP °

## 2017-06-22 ENCOUNTER — Telehealth: Payer: Self-pay | Admitting: Internal Medicine

## 2017-06-22 NOTE — Telephone Encounter (Signed)
[phone] PATIENT CALLED ASKING ABOUT A WELLNESS CHECK.  SHE STATED WE TOLD HER IN THE PAST WHO SHE NEEDED TO CALL TO SET THAT UP AND SHE CAN NOT REMEMBER WHO IT WAS

## 2017-06-23 NOTE — Telephone Encounter (Signed)
Spoke with pt and she wants a copy of all her records from 2012. Pt is aware that she needs to come in office to sign a records release form. Records have been copied and are upfront.

## 2017-06-24 ENCOUNTER — Other Ambulatory Visit: Payer: Self-pay | Admitting: Gastroenterology

## 2017-07-27 ENCOUNTER — Ambulatory Visit: Payer: Medicare Other | Admitting: Gastroenterology

## 2017-07-27 ENCOUNTER — Encounter: Payer: Self-pay | Admitting: Gastroenterology

## 2017-07-27 DIAGNOSIS — R197 Diarrhea, unspecified: Secondary | ICD-10-CM | POA: Diagnosis not present

## 2017-07-27 NOTE — Patient Instructions (Signed)
Please complete the stool tests at home and return to [facility]. I will follow-up with them when they are available.  We will see you back in January!

## 2017-07-27 NOTE — Progress Notes (Signed)
CC'ED TO PCP 

## 2017-07-27 NOTE — Assessment & Plan Note (Signed)
61 year old female historically with constipation and pelvic floor dysfunction, presenting now with diarrhea multiple times a day for several weeks. No recent medication changes or antibiotics. Will proceed with stool studies. Last colonoscopy fairly recent in May 2016. History of rectal cancer s/p resection in 2003. Will see what stool studies show and treat accordingly. If no improvement with supportive measures, may need to consider early interval colonoscopy. Return in Jan 2019.

## 2017-07-27 NOTE — Progress Notes (Signed)
Referring Provider: [name], MD Primary Care Physician:  [name], MD Primary GI: Dr. Gala Romney   Chief Complaint  Patient presents with  . Constipation    f/u. Now is having diarrhea about 5-6 times per day x past 3 weeks    HPI:   Linda English is a 61 y.o. female presenting today with a history of constipation, rectal cancer s/p resection in 2003. Colonoscopy on 01/15/2015 showed normal residual rectum and colon. Surgical anastomosis at approximately 5 cm from the anal verge. Repeat surveillance colonoscopy in 5 years. 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 She underwent at least 7 sessions of biofeedback retraining. [facility] recommended Linzess every other day or three times a week. It has been quite difficult to manage bowel regimen, as medications will work for some time and then result in diarrhea.    Had been doing well with taking Amitiza and Miralax as needed but now with 3 weeks of diarrhea. 5-6 times per day. States she has stopped Amitiza and Miralax, but this continues.  Getting confused and can't remember things. Was in [place] and didn't know how to get out. Sees Dr. Merlene Laughter this week. No recent antibiotics. Will take pepto bismol every now and then. No recent medication changes.    Past Medical History:  Diagnosis Date  . Acid reflux   . Allergic rhinitis   . Anxiety   . Arthritis    osteoarthritis  . Bipolar disorder ([place])   . Chronic headache   . Depression   . Diverticula of colon   . DM (diabetes mellitus) ([place])   . Gastric erosions   . Gastric polyps    benign   . Hemorrhoids   . Hiatal hernia   . Hypertension   . IBS (irritable bowel syndrome)   . Lichen planus   . Obstructive sleep apnea   . Panic disorder   . Rectal cancer ([place]) 2003   ileostomy and reversal  . Tubular adenoma     Past Surgical History:  Procedure Laterality Date  . ABDOMINAL HYSTERECTOMY     . APPENDECTOMY    . BACK SURGERY    . CESAREAN SECTION     X  2  . CHOLECYSTECTOMY    . COLONOSCOPY  08/2007   DR Gala Romney, friable anal canal hemorrhoids, surgical anastomosis at 3cm, distal scattered tics  . COLONOSCOPY  12/15/2010   anal papilla and internal hemorrhoids/diminutive polyp in the base of the cecum. Past, tubular adenoma.. Next colonoscopy due in April 2017.  Marland Kitchen COLONOSCOPY  10/27/2005   RMR: Anal canal hemorrhoids. Surgical anastomosis at 3 cm from the anal verge appeared normal. Few scattered distal diverticula. The residual  colonic mucosa appeared normal. I suspect the patient bled from hemorrhoids  . COLONOSCOPY N/A 01/15/2015   YQM:VHQION residual rectum and colon. next tcs 12/2019  . ESOPHAGEAL DILATION N/A 06/27/2015   Procedure: ESOPHAGEAL DILATION;  Surgeon: Daneil Dolin, MD;  [facility];  Service: Endoscopy;  Laterality: N/A;  . ESOPHAGOGASTRODUODENOSCOPY  05/2002   DR ROURK, normal  . ESOPHAGOGASTRODUODENOSCOPY  08/03/2011   RMR: small HH/ gastric polyps  . ESOPHAGOGASTRODUODENOSCOPY N/A 06/27/2015   Dr. Gala Romney: Mild erosive reflux esophagitits. Status post passage of a Maloney dilator. Hiatal hernia. Gastric polyps and abnormal gastric mucosa of doubtful clinical significane. status post biopsy, benign fundic gland polyp, negative H.pylori  . low anterior rection  2003   RECTAL CANCER  .  SPINE SURGERY  2001   L5,S1 HEMILAMINOTOMY AND DISCECTOMY/DR DEATON    Current Outpatient Medications  Medication Sig Dispense Refill  . amLODipine (NORVASC) 10 MG tablet Take 10 mg by mouth daily.    Marland Kitchen atorvastatin (LIPITOR) 10 MG tablet Take 10 mg by mouth at bedtime.    . benazepril (LOTENSIN) 20 MG tablet Take 20 mg by mouth daily.  4  . chlorthalidone (HYGROTON) 25 MG tablet Take 25 mg by mouth daily.  12  . citalopram (CELEXA) 20 MG tablet Take 10 mg by mouth daily.  5  . clonazePAM (KLONOPIN) 1 MG tablet Take 1 mg by mouth 2 (two) times daily.     Marland Kitchen  lamoTRIgine (LAMICTAL) 200 MG tablet Take 200 mg by mouth 2 (two) times daily.     Marland Kitchen levETIRAcetam (KEPPRA) 500 MG tablet Take 750 mg by mouth 2 (two) times daily.   1  . metFORMIN (GLUCOPHAGE-XR) 500 MG 24 hr tablet Take 500 mg by mouth 2 (two) times daily.    . pantoprazole (PROTONIX) 40 MG tablet TAKE 1 TABLET BY MOUTH TWICE DAILY 60 tablet 5  . PRESCRIPTION MEDICATION Vitamin D 25000 by mouth once a week    . ranitidine (ZANTAC) 150 MG tablet Take 1 tablet (150 mg total) by mouth 2 (two) times daily as needed. (Patient taking differently: Take 150 mg by mouth 2 (two) times daily. ) 60 tablet 5   No current facility-administered medications for this visit.     Allergies as of 07/27/2017 - Review Complete 07/27/2017  Allergen Reaction Noted  . Lithium Nausea And Vomiting 06/27/2015  . Excedrin extra strength [aspirin-acetaminophen-caffeine] Other (See Comments) 10/28/2014    Family History  Problem Relation Age of Onset  . Colon cancer Paternal Grandfather 22  . Colon polyps Brother   . Colon polyps Cousin     Social History   Socioeconomic History  . Marital status: Divorced    Spouse name: None  . Number of children: None  . Years of education: None  . Highest education level: None  Social Needs  . Financial resource strain: None  . Food insecurity - worry: None  . Food insecurity - inability: None  . Transportation needs - medical: None  . Transportation needs - non-medical: None  Occupational History  . Occupation: Product manager: RETIRED    Comment: [employer]  Tobacco Use  . Smoking status: Never Smoker  . Smokeless tobacco: Never Used  . Tobacco comment: Never smoked  Substance and Sexual Activity  . Alcohol use: No  . Drug use: No  . Sexual activity: No  Other Topics Concern  . None  Social History Narrative  . None    Review of Systems: Gen: Denies fever, chills, anorexia. Denies fatigue, weakness, weight loss.  CV: Denies chest pain,  palpitations, syncope, peripheral edema, and claudication. Resp: Denies dyspnea at rest, cough, wheezing, coughing up blood, and pleurisy. GI: see HPI  Derm: Denies rash, itching, dry skin Psych: Denies depression, anxiety, memory loss, confusion. No homicidal or suicidal ideation.  Heme: Denies bruising, bleeding, and enlarged lymph nodes.  Physical Exam: BP 107/70   Pulse 75   Temp (!) 97.2 F (36.2 C) (Oral)   Ht 5\' 1"  (1.549 m)   Wt 154 lb 12.8 oz (70.2 kg)   BMI 29.25 kg/m  General:   Alert and oriented. No distress noted. Pleasant and cooperative.  Head:  Normocephalic and atraumatic. Eyes:  Conjuctiva clear without scleral icterus.  Mouth:  Oral mucosa pink and moist.  Abdomen:  +BS, soft, non-tender and non-distended. No rebound or guarding. No HSM or masses noted. Msk:  Symmetrical without gross deformities. Normal posture. Extremities:  Without edema. Neurologic:  Alert and  oriented x4 Psych:  Alert and cooperative. Normal mood and affect.

## 2017-07-31 LAB — STOOL CULTURE: E COLI SHIGA TOXIN ASSAY: NEGATIVE

## 2017-07-31 LAB — CLOSTRIDIUM DIFFICILE BY PCR: CDIFFPCR: NEGATIVE

## 2017-08-03 NOTE — Progress Notes (Signed)
Looks like Giardia did not get done. However, stool culture and Cdiff negative. How is diarrhea?

## 2017-08-04 ENCOUNTER — Other Ambulatory Visit: Payer: Self-pay | Admitting: Gastroenterology

## 2017-08-04 MED ORDER — DICYCLOMINE HCL 10 MG PO CAPS: 10.0000 mg | ORAL_CAPSULE | Freq: Four times a day (QID) | ORAL | 1 refills | Status: DC

## 2017-08-04 NOTE — Progress Notes (Signed)
Stay hydrated. I sent in Bentyl to take before meals and at bedtime, no more than 4 times a day. Monitor for dry mouth, constipation, drowsiness. Will see her in Jan.

## 2017-08-30 DIAGNOSIS — Z85048 Personal history of other malignant neoplasm of rectum, rectosigmoid junction, and anus: Secondary | ICD-10-CM | POA: Insufficient documentation

## 2017-09-09 ENCOUNTER — Ambulatory Visit: Payer: 59 | Admitting: Gastroenterology

## 2017-09-09 ENCOUNTER — Telehealth: Payer: Self-pay | Admitting: *Deleted

## 2017-09-09 NOTE — Telephone Encounter (Signed)
Spoke with pt and notified her that she should take med twice daily or as needed. When instructions were given, pt said that's what I'm already doing. Pt said she started of qid as directed then reduced to bid. Pt  failed to mention that she already reduced herself down to taking med twice daily when she called the first time. Pt states that even when med is taken twice daily she still has diarrhea sometimes.

## 2017-09-09 NOTE — Telephone Encounter (Signed)
Decrease to BID, or just as needed. Constipation is a possible side effect of this. Glad she called so we can prevent this!

## 2017-09-09 NOTE — Telephone Encounter (Signed)
Patient called stating she is unable to take the bentyl QID because it makes her constipated. She is requesting recs on what to do. Has pending appt 09/29/17.

## 2017-09-12 NOTE — Telephone Encounter (Signed)
Noted. Take as needed.

## 2017-09-12 NOTE — Telephone Encounter (Signed)
Pt notified and will f/u at her 09/29/17 apt.

## 2017-09-15 ENCOUNTER — Other Ambulatory Visit: Payer: Self-pay | Admitting: Gastroenterology

## 2017-09-27 ENCOUNTER — Other Ambulatory Visit: Payer: Self-pay

## 2017-09-27 MED ORDER — RANITIDINE HCL 150 MG PO TABS: ORAL_TABLET | ORAL | 3 refills | Status: DC

## 2017-09-28 ENCOUNTER — Ambulatory Visit: Payer: 59 | Admitting: Gastroenterology

## 2017-09-29 ENCOUNTER — Ambulatory Visit: Payer: Medicare Other | Admitting: Gastroenterology

## 2017-09-29 ENCOUNTER — Encounter: Payer: Self-pay | Admitting: Gastroenterology

## 2017-09-29 VITALS — BP 111/69 | HR 75 | Temp 97.1°F | Ht 60.25 in | Wt 161.2 lb

## 2017-09-29 DIAGNOSIS — K589 Irritable bowel syndrome without diarrhea: Secondary | ICD-10-CM

## 2017-09-29 DIAGNOSIS — K219 Gastro-esophageal reflux disease without esophagitis: Secondary | ICD-10-CM | POA: Diagnosis not present

## 2017-09-29 NOTE — Progress Notes (Signed)
Referring Provider: [name], MD Primary Care Physician:  [name], MD Primary GI: Dr. Gala Romney   Chief Complaint  Patient presents with  . Constipation  . Diarrhea  . Gastroesophageal Reflux    HPI:   Linda English is a 62 y.o. female presenting today with a history of alternating constipation and diarrhea, rectal cancer s/p resection in 2003. Colonoscopy May 2016 with normal residual rectum and colon. Surgical anastomosis at approximately 5 cm from anal verge. Repeat colonoscopy due in 2021. 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 She underwent at least 7 sessions of biofeedback retraining. [facility] recommended Linzess every other day or three times a week. It has been quite difficult to manage bowel regimen, as medications will work for some time and then result in diarrhea.  Last seen in Nov 2018 and was taking Amitiza and Miralax. She was having diarrhea even after stopping this. Stool studies negative for infectious process. Bentyl prescribed to take just as needed. This caused significant constipation, so she stopped it. States she remembered an old trick where you eat marshmallows and pretzels for diarrhea. Found out licorice was giving her diarrhea. Diarrhea resolved now. Now back to not having complete bowel movement and having multiple bowel movements a day.   Protonix BID. Taking ranitidine as well. Reflux occasionally.   Issues with confusion. Seeing Dr. Merlene Laughter. Tests underway.    Past Medical History:  Diagnosis Date  . Acid reflux   . Allergic rhinitis   . Anxiety   . Arthritis    osteoarthritis  . Bipolar disorder ([place])   . Chronic headache   . Depression   . Diverticula of colon   . DM (diabetes mellitus) ([place])   . Gastric erosions   . Gastric polyps    benign   . Hemorrhoids   . Hiatal hernia   . Hypertension   . IBS (irritable bowel syndrome)   . Lichen planus   .  Obstructive sleep apnea   . Panic disorder   . Rectal cancer ([place]) 2003   ileostomy and reversal  . Tubular adenoma     Past Surgical History:  Procedure Laterality Date  . ABDOMINAL HYSTERECTOMY    . APPENDECTOMY    . BACK SURGERY    . CESAREAN SECTION     X  2  . CHOLECYSTECTOMY    . COLONOSCOPY  08/2007   DR Gala Romney, friable anal canal hemorrhoids, surgical anastomosis at 3cm, distal scattered tics  . COLONOSCOPY  12/15/2010   anal papilla and internal hemorrhoids/diminutive polyp in the base of the cecum. Past, tubular adenoma.. Next colonoscopy due in April 2017.  Marland Kitchen COLONOSCOPY  10/27/2005   RMR: Anal canal hemorrhoids. Surgical anastomosis at 3 cm from the anal verge appeared normal. Few scattered distal diverticula. The residual  colonic mucosa appeared normal. I suspect the patient bled from hemorrhoids  . COLONOSCOPY N/A 01/15/2015   PJK:[id number] residual rectum and colon. next tcs 12/2019  . ESOPHAGEAL DILATION N/A 06/27/2015   Procedure: ESOPHAGEAL DILATION;  Surgeon: Daneil Dolin, MD;  [facility];  Service: Endoscopy;  Laterality: N/A;  . ESOPHAGOGASTRODUODENOSCOPY  05/2002   DR ROURK, normal  . ESOPHAGOGASTRODUODENOSCOPY  08/03/2011   RMR: small HH/ gastric polyps  . ESOPHAGOGASTRODUODENOSCOPY N/A 06/27/2015   Dr. Gala Romney: Mild erosive reflux esophagitits. Status post passage of a Maloney dilator. Hiatal hernia. Gastric polyps and abnormal gastric mucosa of doubtful clinical significane. status  post biopsy, benign fundic gland polyp, negative H.pylori  . low anterior rection  2003   RECTAL CANCER  . SPINE SURGERY  2001   L5,S1 HEMILAMINOTOMY AND DISCECTOMY/DR DEATON    Current Outpatient Medications  Medication Sig Dispense Refill  . amLODipine (NORVASC) 10 MG tablet Take 10 mg by mouth daily.    Marland Kitchen atorvastatin (LIPITOR) 10 MG tablet Take 10 mg by mouth at bedtime.    . benazepril (LOTENSIN) 20 MG tablet Take 20 mg by mouth daily.  4  . chlorthalidone  (HYGROTON) 25 MG tablet Take 25 mg by mouth daily.  12  . citalopram (CELEXA) 20 MG tablet Take 10 mg by mouth daily.  5  . clonazePAM (KLONOPIN) 1 MG tablet Take 1 mg by mouth 2 (two) times daily.     Marland Kitchen lamoTRIgine (LAMICTAL) 200 MG tablet Take 200 mg by mouth 2 (two) times daily.     Marland Kitchen levETIRAcetam (KEPPRA) 500 MG tablet Take 500 mg by mouth daily.   1  . metFORMIN (GLUCOPHAGE-XR) 500 MG 24 hr tablet Take 500 mg by mouth 2 (two) times daily.    . pantoprazole (PROTONIX) 40 MG tablet TAKE 1 TABLET BY MOUTH TWICE DAILY 60 tablet 5  . PRESCRIPTION MEDICATION Vitamin D 25000 by mouth once a week    . ranitidine (ZANTAC) 150 MG tablet TAKE 1 TABLET(150 MG) BY MOUTH TWICE DAILY AS NEEDED (Patient taking differently: Take 150 mg by mouth 2 (two) times daily. TAKE 1 TABLET(150 MG) BY MOUTH TWICE DAILY AS NEEDED) 180 tablet 3   No current facility-administered medications for this visit.     Allergies as of 09/29/2017 - Review Complete 09/29/2017  Allergen Reaction Noted  . Lithium Nausea And Vomiting 06/27/2015  . Excedrin extra strength [aspirin-acetaminophen-caffeine] Other (See Comments) 10/28/2014    Family History  Problem Relation Age of Onset  . Colon cancer Paternal Grandfather 9  . Colon polyps Brother   . Colon polyps Cousin     Social History   Socioeconomic History  . Marital status: Divorced    Spouse name: None  . Number of children: None  . Years of education: None  . Highest education level: None  Social Needs  . Financial resource strain: None  . Food insecurity - worry: None  . Food insecurity - inability: None  . Transportation needs - medical: None  . Transportation needs - non-medical: None  Occupational History  . Occupation: Product manager: RETIRED    Comment: [employer]  Tobacco Use  . Smoking status: Never Smoker  . Smokeless tobacco: Never Used  . Tobacco comment: Never smoked  Substance and Sexual Activity  . Alcohol use: No  . Drug use:  No  . Sexual activity: No  Other Topics Concern  . None  Social History Narrative  . None    Review of Systems: Gen: Denies fever, chills, anorexia. Denies fatigue, weakness, weight loss.  CV: Denies chest pain, palpitations, syncope, peripheral edema, and claudication. Resp: Denies dyspnea at rest, cough, wheezing, coughing up blood, and pleurisy. GI: see HPI  Derm: Denies rash, itching, dry skin Psych: Denies depression, anxiety, memory loss, confusion. No homicidal or suicidal ideation.  Heme: Denies bruising, bleeding, and enlarged lymph nodes.  Physical Exam: BP 111/69   Pulse 75   Temp (!) 97.1 F (36.2 C) (Oral)   Ht 5' 0.25" (1.53 m)   Wt 161 lb 3.2 oz (73.1 kg)   BMI 31.22 kg/m  General:  Alert and oriented. No distress noted. Pleasant and cooperative.  Head:  Normocephalic and atraumatic. Eyes:  Conjuctiva clear without scleral icterus. Mouth:  Oral mucosa pink and moist.  Abdomen:  +BS, soft, non-tender and non-distended. No rebound or guarding. No HSM or masses noted. Msk:  Symmetrical without gross deformities. Normal posture. Extremities:  Without edema. Neurologic:  Alert and  oriented x4 Psych:  Alert and cooperative. Normal mood and affect.

## 2017-09-29 NOTE — Assessment & Plan Note (Signed)
Stop Protonix and trial Dexilant samples. No alarm features. Return in 3 months.

## 2017-09-29 NOTE — Assessment & Plan Note (Signed)
Predominantly constipation. Known pelvic floor dysfunction. Linzess, Amitiza previously, which will do well for awhile then results in diarrhea. Will trial Trulance. Return in 3 months.

## 2017-09-29 NOTE — Progress Notes (Signed)
cc'ed to pcp °

## 2017-09-29 NOTE — Patient Instructions (Signed)
I would like for you to take Trulance one tablet with or without food every other day. We can do this every day if needed. This is to help avoid constipation and have more productive stool.  Stop Protonix (pantoprazole). Start taking the Dexilant samples once each day.   We will see you in 3 months!

## 2017-10-24 ENCOUNTER — Telehealth: Payer: Self-pay

## 2017-10-24 NOTE — Telephone Encounter (Signed)
Pt called office. She wants AB to know that she is unable to take Trulance. It makes her go too much and she makes a mess.

## 2017-10-25 NOTE — Telephone Encounter (Signed)
Spoke with pt. Pt notified and will try Linzess qod as directed. Pt has medication at home.

## 2017-10-25 NOTE — Telephone Encounter (Signed)
Let's go back to Linzess 145 mcg every other day. Keep appt for 3 months. We have exhausted all options.

## 2017-11-17 ENCOUNTER — Telehealth: Payer: Self-pay | Admitting: Internal Medicine

## 2017-11-17 DIAGNOSIS — K2100 Gastro-esophageal reflux disease with esophagitis, without bleeding: Secondary | ICD-10-CM

## 2017-11-17 NOTE — Telephone Encounter (Signed)
[phone] patient would like a prescription of dexilant sent to [facility] on [address].  She had samples and they worked

## 2017-11-18 MED ORDER — DEXLANSOPRAZOLE 60 MG PO CPDR: 60.0000 mg | DELAYED_RELEASE_CAPSULE | Freq: Every day | ORAL | 3 refills | Status: DC

## 2017-11-18 NOTE — Telephone Encounter (Signed)
Routing to [facility] refill 

## 2017-11-18 NOTE — Addendum Note (Signed)
Addended by: Gordy Levan, ERIC A on: 11/18/2017 02:31 PM   Modules accepted: Orders

## 2017-11-30 ENCOUNTER — Ambulatory Visit (HOSPITAL_COMMUNITY)
Admission: RE | Admit: 2017-11-30 | Discharge: 2017-11-30 | Disposition: A | Discharge status: Home / Self Care | Payer: Medicare Other | Source: Ambulatory Visit | Attending: Neurology | Admitting: Neurology

## 2017-11-30 ENCOUNTER — Other Ambulatory Visit (HOSPITAL_COMMUNITY): Payer: Self-pay | Admitting: Neurology

## 2017-11-30 DIAGNOSIS — M5030 Other cervical disc degeneration, unspecified cervical region: Secondary | ICD-10-CM | POA: Diagnosis not present

## 2017-11-30 DIAGNOSIS — M542 Cervicalgia: Secondary | ICD-10-CM | POA: Insufficient documentation

## 2017-11-30 DIAGNOSIS — Z981 Arthrodesis status: Secondary | ICD-10-CM | POA: Insufficient documentation

## 2017-12-28 ENCOUNTER — Encounter: Payer: Self-pay | Admitting: Gastroenterology

## 2017-12-28 ENCOUNTER — Ambulatory Visit: Payer: Medicare Other | Admitting: Gastroenterology

## 2017-12-28 VITALS — BP 93/58 | HR 80 | Temp 97.4°F | Ht 61.0 in | Wt 160.8 lb

## 2017-12-28 DIAGNOSIS — K219 Gastro-esophageal reflux disease without esophagitis: Secondary | ICD-10-CM | POA: Diagnosis not present

## 2017-12-28 DIAGNOSIS — K582 Mixed irritable bowel syndrome: Secondary | ICD-10-CM

## 2017-12-28 NOTE — Patient Instructions (Signed)
Please talk to Dr. Willey Blade about your neck.  Let me know if you would like to change back to Protonix.  Add fiber such as Metamucil or Benefiber daily.  We will see you in 3 months!  It was a pleasure to see you today. I strive to create trusting relationships with patients to provide genuine, compassionate, and quality care. I value your feedback. If you receive a survey regarding your visit,  I greatly appreciate you taking time to fill this out.   Annitta Needs, PhD, ANP-BC [facility]

## 2017-12-28 NOTE — Progress Notes (Signed)
Primary Care Physician:  [name], MD  Primary GI: Dr. Gala Romney   Chief Complaint  Patient presents with  . Constipation    f/u. Has more diarrhea now    HPI:   Linda English is a 62 y.o. female presenting today with a history of alternating constipation and diarrhea, rectal cancer s/p resection in 2003. Colonoscopy May 2016 with normal residual rectum and colon. Surgical anastomosis at approximately 5 cm from anal verge. Repeat colonoscopy due in 2021. 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 She underwent at least 7 sessions of biofeedback retraining. [facility] recommended Linzess every other day or three times a week. It has been quite difficult to manage bowel regimen, as medications will work for some time and then result in diarrhea.  Last seen in Jan 2019. Diarrhea resolved at that time. Unable to take Trulance. Intermittent episodes of diarrhea, intermittent constipation. No rectal bleeding. Appetite not so great lately but eats anyway. Nocturnal reflux. Trying not to eat late at night. States sometimes at night wakes up and has to remind herself how to swallow. Feels like there is something in her throat on the "right side" when swallowing. Rare issues with swallowing food. Sometimes coughing a lot at night. Will be seeing PCP in future. US neck on file from 2015 with 3 mm non-specific cyst of left lobe.   Past Medical History:  Diagnosis Date  . Acid reflux   . Allergic rhinitis   . Anxiety   . Arthritis    osteoarthritis  . Bipolar disorder ([place])   . Chronic headache   . Depression   . Diverticula of colon   . DM (diabetes mellitus) ([place])   . Gastric erosions   . Gastric polyps    benign   . Hemorrhoids   . Hiatal hernia   . Hypertension   . IBS (irritable bowel syndrome)   . Lichen planus   . Obstructive sleep apnea   . Panic disorder   . Rectal cancer ([place]) 2003   ileostomy and  reversal  . Tubular adenoma     Past Surgical History:  Procedure Laterality Date  . ABDOMINAL HYSTERECTOMY    . APPENDECTOMY    . BACK SURGERY    . CESAREAN SECTION     X  2  . CHOLECYSTECTOMY    . COLONOSCOPY  08/2007   DR Gala Romney, friable anal canal hemorrhoids, surgical anastomosis at 3cm, distal scattered tics  . COLONOSCOPY  12/15/2010   anal papilla and internal hemorrhoids/diminutive polyp in the base of the cecum. Past, tubular adenoma.. Next colonoscopy due in April 2017.  Marland Kitchen COLONOSCOPY  10/27/2005   RMR: Anal canal hemorrhoids. Surgical anastomosis at 3 cm from the anal verge appeared normal. Few scattered distal diverticula. The residual  colonic mucosa appeared normal. I suspect the patient bled from hemorrhoids  . COLONOSCOPY N/A 01/15/2015   NID:[id number] residual rectum and colon. next tcs 12/2019  . ESOPHAGEAL DILATION N/A 06/27/2015   Procedure: ESOPHAGEAL DILATION;  Surgeon: Daneil Dolin, MD;  [facility];  Service: Endoscopy;  Laterality: N/A;  . ESOPHAGOGASTRODUODENOSCOPY  05/2002   DR ROURK, normal  . ESOPHAGOGASTRODUODENOSCOPY  08/03/2011   RMR: small HH/ gastric polyps  . ESOPHAGOGASTRODUODENOSCOPY N/A 06/27/2015   Dr. Gala Romney: Mild erosive reflux esophagitits. Status post passage of a Maloney dilator. Hiatal hernia. Gastric polyps and abnormal gastric mucosa of doubtful clinical significane. status post biopsy, benign fundic  gland polyp, negative H.pylori  . low anterior rection  2003   RECTAL CANCER  . SPINE SURGERY  2001   L5,S1 HEMILAMINOTOMY AND DISCECTOMY/DR DEATON    Current Outpatient Medications  Medication Sig Dispense Refill  . amLODipine (NORVASC) 10 MG tablet Take 10 mg by mouth daily.    Marland Kitchen atorvastatin (LIPITOR) 10 MG tablet Take 10 mg by mouth at bedtime.    . benazepril (LOTENSIN) 20 MG tablet Take 20 mg by mouth daily.  4  . chlorthalidone (HYGROTON) 25 MG tablet Take 25 mg by mouth daily.  12  . citalopram (CELEXA) 20 MG tablet Take  10 mg by mouth daily.  5  . clonazePAM (KLONOPIN) 1 MG tablet Take 1 mg by mouth 2 (two) times daily as needed for anxiety.     Marland Kitchen dexlansoprazole (DEXILANT) 60 MG capsule Take 1 capsule (60 mg total) by mouth daily. 90 capsule 3  . lamoTRIgine (LAMICTAL) 200 MG tablet Take 200 mg by mouth 2 (two) times daily.     Marland Kitchen levETIRAcetam (KEPPRA) 500 MG tablet Take 1,500 mg by mouth at bedtime.   1  . metFORMIN (GLUCOPHAGE-XR) 500 MG 24 hr tablet Take 500 mg by mouth 2 (two) times daily.    Marland Kitchen PRESCRIPTION MEDICATION Vitamin D 25000 by mouth once a week    . ranitidine (ZANTAC) 150 MG tablet TAKE 1 TABLET(150 MG) BY MOUTH TWICE DAILY AS NEEDED (Patient taking differently: Take 150 mg by mouth 2 (two) times daily. TAKE 1 TABLET(150 MG) BY MOUTH TWICE DAILY AS NEEDED) 180 tablet 3   No current facility-administered medications for this visit.     Allergies as of 12/28/2017 - Review Complete 12/28/2017  Allergen Reaction Noted  . Lithium Nausea And Vomiting 06/27/2015  . Excedrin extra strength [aspirin-acetaminophen-caffeine] Other (See Comments) 10/28/2014    Family History  Problem Relation Age of Onset  . Colon cancer Paternal Grandfather 39  . Colon polyps Brother   . Colon polyps Cousin     Social History   Socioeconomic History  . Marital status: Divorced    Spouse name: Not on file  . Number of children: Not on file  . Years of education: Not on file  . Highest education level: Not on file  Occupational History  . Occupation: Product manager: RETIRED    Comment: [place]  . Financial resource strain: Not on file  . Food insecurity:    Worry: Not on file    Inability: Not on file  . Transportation needs:    Medical: Not on file    Non-medical: Not on file  Tobacco Use  . Smoking status: Never Smoker  . Smokeless tobacco: Never Used  . Tobacco comment: Never smoked  Substance and Sexual Activity  . Alcohol use: No  . Drug use: No  . Sexual activity:  Never  Lifestyle  . Physical activity:    Days per week: Not on file    Minutes per session: Not on file  . Stress: Not on file  Relationships  . Social connections:    Talks on phone: Not on file    Gets together: Not on file    Attends religious service: Not on file    Active member of club or organization: Not on file    Attends meetings of clubs or organizations: Not on file    Relationship status: Not on file  Other Topics Concern  . Not on file  Social  History Narrative  . Not on file    Review of Systems: Gen: Denies fever, chills, anorexia. Denies fatigue, weakness, weight loss.  CV: Denies chest pain, palpitations, syncope, peripheral edema, and claudication. Resp: Denies dyspnea at rest, cough, wheezing, coughing up blood, and pleurisy. GI: see HPI  Derm: Denies rash, itching, dry skin Psych: Denies depression, anxiety, memory loss, confusion. No homicidal or suicidal ideation.  Heme: Denies bruising, bleeding, and enlarged lymph nodes.  Physical Exam: BP (!) 93/58   Pulse 80   Temp (!) 97.4 F (36.3 C) (Oral)   Ht 5\' 1"  (1.549 m)   Wt 160 lb 12.8 oz (72.9 kg)   BMI 30.38 kg/m  General:   Alert and oriented. No distress noted. Pleasant and cooperative.  Head:  Normocephalic and atraumatic. Eyes:  Conjuctiva clear without scleral icterus. Mouth:  Oral mucosa pink and moist.  Abdomen:  +BS, soft, non-tender and non-distended. No rebound or guarding. No HSM or masses noted. Msk:  Symmetrical without gross deformities. Normal posture. Extremities:  Without edema. Neurologic:  Alert and  oriented x4 Psych:  Alert and cooperative. Normal mood and affect.

## 2017-12-29 NOTE — Assessment & Plan Note (Signed)
Alternating constipation and diarrhea. Has been difficult to treat historically. Known pelvic floor dysfunction. Multiple prescriptive agents trialed. Will trial fiber daily, next colonoscopy in 2021. No alarm symptoms.

## 2017-12-29 NOTE — Assessment & Plan Note (Signed)
Continue Dexilant for now. May need to return to Protonix and increase to BID for short-term. Dietary and behavior modification discussed. I have asked that she talk to her PCP regarding sensation of feeling something right-side of neck while swallowing: she has no esophageal dysphagia. US neck on file from 2015. May need updating. If any evidence of esophageal dysphagia, she is to call right away. Return in 3 months.

## 2017-12-30 NOTE — Progress Notes (Signed)
CC'ED TO PCP 

## 2018-03-30 ENCOUNTER — Ambulatory Visit: Payer: Medicare Other | Admitting: Gastroenterology

## 2018-03-30 ENCOUNTER — Other Ambulatory Visit: Payer: Self-pay | Admitting: Nurse Practitioner

## 2018-03-30 ENCOUNTER — Encounter: Payer: Self-pay | Admitting: Gastroenterology

## 2018-03-30 ENCOUNTER — Encounter: Payer: Self-pay | Admitting: Internal Medicine

## 2018-03-30 VITALS — BP 106/71 | HR 81 | Temp 98.5°F | Ht 61.0 in | Wt 165.6 lb

## 2018-03-30 DIAGNOSIS — K2100 Gastro-esophageal reflux disease with esophagitis, without bleeding: Secondary | ICD-10-CM

## 2018-03-30 DIAGNOSIS — K582 Mixed irritable bowel syndrome: Secondary | ICD-10-CM | POA: Diagnosis not present

## 2018-03-30 NOTE — Progress Notes (Signed)
Primary Care Physician:  [name], MD  Primary GI: Dr. Gala Romney   Chief Complaint  Patient presents with  . Follow-up    Recent loose stools/ intermittent with constipation    HPI:   Linda English is a 62 y.o. female presenting today with a history of alternating constipation and diarrhea, rectal cancer s/p resection in 2003. Colonoscopy May 2016 with normal residual rectum and colon. Surgical anastomosis at approximately 5 cm from anal verge. 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 She underwent at least 7 sessions of biofeedback retraining. [facility] recommended Linzess every other day or three times a week. It has been quite difficult to manage bowel regimen, as medications will work for some time and then result in diarrhea.  She has tried multiple prescriptive agents with alternating constipation and diarrhea.   Returns today doing well with eating fiber cereal daily. Loves black licorice, which causes diarrhea. Buttery foods, fisherman's gallery results in diarrhea. Taking Protonix BID. GERD controlled. Taken off statin recently due to muscle aches/cramps. No other upper GI concerns. Overall doing well.    Past Medical History:  Diagnosis Date  . Acid reflux   . Allergic rhinitis   . Anxiety   . Arthritis    osteoarthritis  . Bipolar disorder ([place])   . Chronic headache   . Depression   . Diverticula of colon   . DM (diabetes mellitus) ([place])   . Gastric erosions   . Gastric polyps    benign   . Hemorrhoids   . Hiatal hernia   . Hypertension   . IBS (irritable bowel syndrome)   . Lichen planus   . Obstructive sleep apnea   . Panic disorder   . Rectal cancer ([place]) 2003   ileostomy and reversal  . Tubular adenoma     Past Surgical History:  Procedure Laterality Date  . ABDOMINAL HYSTERECTOMY    . APPENDECTOMY    . BACK SURGERY    . CESAREAN  SECTION     X  2  . CHOLECYSTECTOMY    . COLONOSCOPY  08/2007   DR Gala Romney, friable anal canal hemorrhoids, surgical anastomosis at 3cm, distal scattered tics  . COLONOSCOPY  12/15/2010   anal papilla and internal hemorrhoids/diminutive polyp in the base of the cecum. Past, tubular adenoma.. Next colonoscopy due in April 2017.  Marland Kitchen COLONOSCOPY  10/27/2005   RMR: Anal canal hemorrhoids. Surgical anastomosis at 3 cm from the anal verge appeared normal. Few scattered distal diverticula. The residual  colonic mucosa appeared normal. I suspect the patient bled from hemorrhoids  . COLONOSCOPY N/A 01/15/2015   TWS:[id number] residual rectum and colon. next tcs 12/2019  . ESOPHAGEAL DILATION N/A 06/27/2015   Procedure: ESOPHAGEAL DILATION;  Surgeon: Daneil Dolin, MD;  [facility];  Service: Endoscopy;  Laterality: N/A;  . ESOPHAGOGASTRODUODENOSCOPY  05/2002   DR ROURK, normal  . ESOPHAGOGASTRODUODENOSCOPY  08/03/2011   RMR: small HH/ gastric polyps  . ESOPHAGOGASTRODUODENOSCOPY N/A 06/27/2015   Dr. Gala Romney: Mild erosive reflux esophagitits. Status post passage of a Maloney dilator. Hiatal hernia. Gastric polyps and abnormal gastric mucosa of doubtful clinical significane. status post biopsy, benign fundic gland polyp, negative H.pylori  . low anterior rection  2003   RECTAL CANCER  . SPINE SURGERY  2001   L5,S1 HEMILAMINOTOMY AND DISCECTOMY/DR DEATON    Current Outpatient Medications  Medication Sig Dispense Refill  . amLODipine (  NORVASC) 10 MG tablet Take 10 mg by mouth daily.    . benazepril (LOTENSIN) 20 MG tablet Take 20 mg by mouth daily.  4  . chlorthalidone (HYGROTON) 25 MG tablet Take 25 mg by mouth daily.  12  . citalopram (CELEXA) 20 MG tablet Take 10 mg by mouth daily.  5  . clonazePAM (KLONOPIN) 1 MG tablet Take 1 mg by mouth 2 (two) times daily as needed for anxiety.     . lamoTRIgine (LAMICTAL) 200 MG tablet Take 200 mg by mouth 2 (two) times daily.     Marland Kitchen levETIRAcetam (KEPPRA)  500 MG tablet Take 1,500 mg by mouth at bedtime.   1  . metFORMIN (GLUCOPHAGE-XR) 500 MG 24 hr tablet Take 500 mg by mouth 2 (two) times daily.    . pantoprazole (PROTONIX) 40 MG tablet Take 40 mg by mouth 2 (two) times daily.    Marland Kitchen PRESCRIPTION MEDICATION Vitamin D 50000 by mouth once a week    . ranitidine (ZANTAC) 150 MG tablet TAKE 1 TABLET(150 MG) BY MOUTH TWICE DAILY AS NEEDED (Patient taking differently: Take 150 mg by mouth 2 (two) times daily. TAKE 1 TABLET(150 MG) BY MOUTH TWICE DAILY AS NEEDED) 180 tablet 3   No current facility-administered medications for this visit.     Allergies as of 03/30/2018 - Review Complete 03/30/2018  Allergen Reaction Noted  . Lithium Nausea And Vomiting 06/27/2015  . Excedrin extra strength [aspirin-acetaminophen-caffeine] Other (See Comments) 10/28/2014    Family History  Problem Relation Age of Onset  . Colon cancer Paternal Grandfather 59  . Colon polyps Brother   . Colon polyps Cousin     Social History   Socioeconomic History  . Marital status: Divorced    Spouse name: Not on file  . Number of children: Not on file  . Years of education: Not on file  . Highest education level: Not on file  Occupational History  . Occupation: Product manager: RETIRED    Comment: [employer]  . Financial resource strain: Not on file  . Food insecurity:    Worry: Not on file    Inability: Not on file  . Transportation needs:    Medical: Not on file    Non-medical: Not on file  Tobacco Use  . Smoking status: Never Smoker  . Smokeless tobacco: Never Used  . Tobacco comment: Never smoked  Substance and Sexual Activity  . Alcohol use: No  . Drug use: No  . Sexual activity: Never  Lifestyle  . Physical activity:    Days per week: Not on file    Minutes per session: Not on file  . Stress: Not on file  Relationships  . Social connections:    Talks on phone: Not on file    Gets together: Not on file    Attends religious  service: Not on file    Active member of club or organization: Not on file    Attends meetings of clubs or organizations: Not on file    Relationship status: Not on file  Other Topics Concern  . Not on file  Social History Narrative  . Not on file    Review of Systems: Gen: Denies fever, chills, anorexia. Denies fatigue, weakness, weight loss.  CV: Denies chest pain, palpitations, syncope, peripheral edema, and claudication. Resp: Denies dyspnea at rest, cough, wheezing, coughing up blood, and pleurisy. GI: see HPI  Derm: Denies rash, itching, dry skin Psych: Denies depression,  anxiety, memory loss, confusion. No homicidal or suicidal ideation.  Heme: Denies bruising, bleeding, and enlarged lymph nodes.  Physical Exam: BP 106/71   Pulse 81   Temp 98.5 F (36.9 C) (Oral)   Ht 5\' 1"  (1.549 m)   Wt 165 lb 9.6 oz (75.1 kg)   BMI 31.29 kg/m  General:   Alert and oriented. No distress noted. Pleasant and cooperative.  Head:  Normocephalic and atraumatic. Eyes:  Conjuctiva clear without scleral icterus. Mouth:  Oral mucosa pink and moist.  Abdomen:  +BS, soft, non-tender and non-distended. No rebound or guarding. No HSM or masses noted. Msk:  Symmetrical without gross deformities. Normal posture. Extremities:  Without edema. Neurologic:  Alert and  oriented x4 Psych:  Alert and cooperative. Normal mood and affect.

## 2018-03-30 NOTE — Assessment & Plan Note (Signed)
Intermittent diarrhea more dietary related. Overall, doing much better with simple increase in fiber foods in diet. Known pelvic floor dysfunction as well. Next colonoscopy in 2021. No concerning symptoms. Return in 8 months.

## 2018-03-30 NOTE — Patient Instructions (Signed)
Continue Protonix twice a day.  Continue high fiber foods, avoiding foods that trigger diarrhea.  I am glad you are doing better overall!  Call if any issues; otherwise, we will see you in 8 months!  It was a pleasure to see you today. I strive to create trusting relationships with patients to provide genuine, compassionate, and quality care. I value your feedback. If you receive a survey regarding your visit,  I greatly appreciate you taking time to fill this out.   Annitta Needs, PhD, ANP-BC [facility]

## 2018-03-30 NOTE — Assessment & Plan Note (Signed)
Continue Protonix BID.

## 2018-03-30 NOTE — Progress Notes (Signed)
cc'ed to pcp °

## 2018-08-16 ENCOUNTER — Other Ambulatory Visit: Payer: Self-pay | Admitting: Gastroenterology

## 2018-08-30 DIAGNOSIS — G4730 Sleep apnea, unspecified: Secondary | ICD-10-CM | POA: Insufficient documentation

## 2018-08-30 DIAGNOSIS — K589 Irritable bowel syndrome without diarrhea: Secondary | ICD-10-CM | POA: Insufficient documentation

## 2018-08-30 DIAGNOSIS — K219 Gastro-esophageal reflux disease without esophagitis: Secondary | ICD-10-CM | POA: Insufficient documentation

## 2018-11-03 ENCOUNTER — Other Ambulatory Visit: Payer: Self-pay | Admitting: Neurology

## 2018-11-03 ENCOUNTER — Other Ambulatory Visit (HOSPITAL_COMMUNITY): Payer: Self-pay | Admitting: Neurology

## 2018-11-03 DIAGNOSIS — R42 Dizziness and giddiness: Secondary | ICD-10-CM

## 2018-11-03 DIAGNOSIS — R296 Repeated falls: Secondary | ICD-10-CM

## 2018-11-03 DIAGNOSIS — M542 Cervicalgia: Secondary | ICD-10-CM

## 2018-11-07 ENCOUNTER — Other Ambulatory Visit: Payer: Self-pay | Admitting: Gastroenterology

## 2018-11-09 ENCOUNTER — Other Ambulatory Visit: Payer: Self-pay

## 2018-11-09 ENCOUNTER — Ambulatory Visit (HOSPITAL_COMMUNITY)
Admission: RE | Admit: 2018-11-09 | Discharge: 2018-11-09 | Disposition: A | Discharge status: Home / Self Care | Payer: Medicare Other | Source: Ambulatory Visit | Attending: Neurology | Admitting: Neurology

## 2018-11-09 DIAGNOSIS — R296 Repeated falls: Secondary | ICD-10-CM

## 2018-11-09 DIAGNOSIS — R42 Dizziness and giddiness: Secondary | ICD-10-CM | POA: Diagnosis not present

## 2018-11-09 DIAGNOSIS — M542 Cervicalgia: Secondary | ICD-10-CM | POA: Diagnosis present

## 2018-11-30 ENCOUNTER — Other Ambulatory Visit: Payer: Self-pay

## 2018-11-30 ENCOUNTER — Ambulatory Visit (INDEPENDENT_AMBULATORY_CARE_PROVIDER_SITE_OTHER): Payer: Medicare Other | Admitting: Gastroenterology

## 2018-11-30 DIAGNOSIS — K219 Gastro-esophageal reflux disease without esophagitis: Secondary | ICD-10-CM

## 2018-11-30 NOTE — Patient Instructions (Signed)
Continue Protonix twice a day, 30 minutes before breakfast and dinner. You can take pepcid as needed.  Please call if any concerns!  Otherwise, we will see you in 6-8 months!  I enjoyed seeing you again today! As you know, I value our relationship and want to provide genuine, compassionate, and quality care. I welcome your feedback. If you receive a survey regarding your visit,  I greatly appreciate you taking time to fill this out. See you next time!  Annitta Needs, PhD, ANP-BC [facility]

## 2018-11-30 NOTE — Progress Notes (Signed)
Primary Care Physician:  [name], MD  Primary GI: Dr. Gala Romney   Virtual Visit via Telephone Note Due to COVID-19, visit is conducted virtually and was requested by patient.   I connected with Linda English on 11/30/18 at  10:30:00 AM EDT by telephone and verified that I am speaking with the correct person using two identifiers.   I discussed the limitations, risks, security and privacy concerns of performing an evaluation and management service by telephone and the availability of in person appointments. I also discussed with the patient that there may be a patient responsible charge related to this service. The patient expressed understanding and agreed to proceed.  Chief Complaint  Patient presents with  . Follow-up    F/U IBS      History of Present Illness: 63 year old female with history of alternating constipation and diarrhea, rectal cancer s/p resection in 2003. Colonoscopy May 2016 with normal residual rectum and colon. Surgical anastomosis at approximately 5 cm from anal verge. 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 She underwent at least 7 sessions of biofeedback retraining.  It has been quite difficult to manage bowel regimen, as medications will work for some time and then result in diarrhea.Physically seen in office Aug 2019. Today routine visit for follow-up via facetime.   Doing pretty well currently with bowel habits. Not taking any prescriptive agents currently. No abdominal pain. Appetite not the best. She thinks it is trying to pick up. Appetite down for about a month. No dysphagia.   GERD: Protonix BID. Taking pepcid daily. Had a few episodes of reflux in the bed and "burned like crazy". Trying not to eat late at night.     Past Medical History:  Diagnosis Date  . Acid reflux   . Allergic rhinitis   . Anxiety   . Arthritis    osteoarthritis  . Bipolar disorder ([place])   . Chronic headache   . Depression   . Diverticula of colon   . DM (diabetes mellitus) ([place])   . Gastric erosions   . Gastric polyps    benign   . Hemorrhoids   . Hiatal hernia   . Hypertension   . IBS (irritable bowel syndrome)   . Lichen planus   . Obstructive sleep apnea   . Panic disorder   . Rectal cancer ([place]) 2003   ileostomy and reversal  . Tubular adenoma      Past Surgical History:  Procedure Laterality Date  . ABDOMINAL HYSTERECTOMY    . APPENDECTOMY    . BACK SURGERY    . CESAREAN SECTION     X  2  . CHOLECYSTECTOMY    . COLONOSCOPY  08/2007   DR Gala Romney, friable anal canal hemorrhoids, surgical anastomosis at 3cm, distal scattered tics  . COLONOSCOPY  12/15/2010   anal papilla and internal hemorrhoids/diminutive polyp in the base of the cecum. Past, tubular adenoma.. Next colonoscopy due in April 2017.  Marland Kitchen COLONOSCOPY  10/27/2005   RMR: Anal canal hemorrhoids. Surgical anastomosis at 3 cm from the anal verge appeared normal. Few scattered distal diverticula. The residual  colonic mucosa appeared normal. I suspect the patient bled from hemorrhoids  . COLONOSCOPY N/A 01/15/2015   YQM:VHQION residual rectum and colon. next tcs 12/2019  . ESOPHAGEAL DILATION N/A 06/27/2015   Procedure: ESOPHAGEAL DILATION;  Surgeon: Daneil Dolin, MD;  [facility];  Service: Endoscopy;  Laterality: N/A;  . ESOPHAGOGASTRODUODENOSCOPY  05/2002   DR ROURK, normal  . ESOPHAGOGASTRODUODENOSCOPY  08/03/2011   RMR: small HH/ gastric polyps  . ESOPHAGOGASTRODUODENOSCOPY N/A 06/27/2015   Dr. Gala Romney: Mild erosive reflux esophagitits. Status post passage of a Maloney dilator. Hiatal hernia. Gastric polyps and abnormal gastric mucosa of doubtful clinical significane. status post biopsy, benign fundic gland polyp, negative H.pylori  . low anterior rection  2003   RECTAL CANCER  . SPINE SURGERY  2001   L5,S1 HEMILAMINOTOMY AND DISCECTOMY/DR  DEATON     Current Meds  Medication Sig  . amLODipine (NORVASC) 10 MG tablet Take 10 mg by mouth daily.  Marland Kitchen atorvastatin (LIPITOR) 10 MG tablet 1 tablet 3 (three) times a week.  . benazepril (LOTENSIN) 20 MG tablet Take 20 mg by mouth daily.  . chlorthalidone (HYGROTON) 25 MG tablet Take 25 mg by mouth daily.  . Cholecalciferol (VITAMIN D3) 1.25 MG (50000 UT) CAPS Take 1 tablet by mouth once a week. 350,000 IU  . citalopram (CELEXA) 20 MG tablet Take 10 mg by mouth daily.  . clonazePAM (KLONOPIN) 1 MG tablet Take 1 mg by mouth 2 (two) times daily as needed for anxiety.   . famotidine (PEPCID) 20 MG tablet Take 20 mg by mouth daily.  Marland Kitchen lamoTRIgine (LAMICTAL) 200 MG tablet Take 200 mg by mouth 2 (two) times daily.   Marland Kitchen levETIRAcetam (KEPPRA) 500 MG tablet Take 1,500 mg by mouth at bedtime.   . meloxicam (MOBIC) 15 MG tablet 1 tablet daily.  . metFORMIN (GLUCOPHAGE-XR) 500 MG 24 hr tablet Take 500 mg by mouth 2 (two) times daily.  . pantoprazole (PROTONIX) 40 MG tablet Take 40 mg by mouth 2 (two) times daily.  Marland Kitchen PRESCRIPTION MEDICATION Vitamin D 50000 by mouth once a week  . ziprasidone (GEODON) 60 MG capsule 1 tablet daily.       Observations/Objective: No distress. Alert, dressed appropriately for season. Maintains eye contact. In good spirits, smiling.   Assessment and Plan: 63 year old female with history of alternating constipation and diarrhea, rectal cancer s/p resection in 2003. Colonoscopy May 2016 with normal residual rectum and colon. Surgical anastomosis at approximately 5 cm from anal verge. 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 She underwent at least 7 sessions of biofeedback retraining.   Thankfully, she is doing well without any prescriptive agents for constipation. No alarm signs/symptoms.  GERD is controlled with PPI BID. Dietary/behavior factors playing a role  in exacerbation, which we discussed at time of visit.   As she is doing so well, we will see her again in 6-8 months for routine office visit.   Follow Up Instructions: Continue PPI BID Continue dietary/behavior modification Return in 6-8 months   I discussed the assessment and treatment plan with the patient. The patient was provided an opportunity to ask questions and all were answered. The patient agreed with the plan and demonstrated an understanding of the instructions.   The patient was advised to call back or seek an in-person evaluation if the symptoms worsen or if the condition fails to improve as anticipated.  I provided 10 minutes of face-to-face time during this encounter via audiovisual technology.  Annitta Needs, PhD, ANP-BC [facility]

## 2018-12-04 ENCOUNTER — Encounter: Payer: Self-pay | Admitting: Internal Medicine

## 2018-12-04 NOTE — Progress Notes (Signed)
CC'ED TO PCP 

## 2018-12-05 DIAGNOSIS — M542 Cervicalgia: Secondary | ICD-10-CM | POA: Diagnosis not present

## 2018-12-05 DIAGNOSIS — M4722 Other spondylosis with radiculopathy, cervical region: Secondary | ICD-10-CM | POA: Diagnosis not present

## 2018-12-20 ENCOUNTER — Other Ambulatory Visit: Payer: Self-pay

## 2018-12-20 ENCOUNTER — Ambulatory Visit (HOSPITAL_COMMUNITY)
Admission: RE | Admit: 2018-12-20 | Discharge: 2018-12-20 | Disposition: A | Discharge status: Home / Self Care | Payer: Medicare Other | Source: Ambulatory Visit | Attending: Neurology | Admitting: Neurology

## 2018-12-20 ENCOUNTER — Other Ambulatory Visit (HOSPITAL_COMMUNITY): Payer: Self-pay | Admitting: Neurology

## 2018-12-20 DIAGNOSIS — M25511 Pain in right shoulder: Secondary | ICD-10-CM | POA: Diagnosis not present

## 2018-12-20 DIAGNOSIS — M25512 Pain in left shoulder: Secondary | ICD-10-CM | POA: Diagnosis not present

## 2018-12-20 DIAGNOSIS — M19011 Primary osteoarthritis, right shoulder: Secondary | ICD-10-CM | POA: Diagnosis not present

## 2019-01-04 DIAGNOSIS — I10 Essential (primary) hypertension: Secondary | ICD-10-CM | POA: Diagnosis not present

## 2019-01-04 DIAGNOSIS — Z79899 Other long term (current) drug therapy: Secondary | ICD-10-CM | POA: Diagnosis not present

## 2019-01-04 DIAGNOSIS — E1129 Type 2 diabetes mellitus with other diabetic kidney complication: Secondary | ICD-10-CM | POA: Diagnosis not present

## 2019-01-11 DIAGNOSIS — M542 Cervicalgia: Secondary | ICD-10-CM | POA: Diagnosis not present

## 2019-01-11 DIAGNOSIS — R296 Repeated falls: Secondary | ICD-10-CM | POA: Diagnosis not present

## 2019-01-11 DIAGNOSIS — R42 Dizziness and giddiness: Secondary | ICD-10-CM | POA: Diagnosis not present

## 2019-01-11 DIAGNOSIS — Z79899 Other long term (current) drug therapy: Secondary | ICD-10-CM | POA: Diagnosis not present

## 2019-01-15 DIAGNOSIS — D649 Anemia, unspecified: Secondary | ICD-10-CM | POA: Diagnosis not present

## 2019-01-15 DIAGNOSIS — N183 Chronic kidney disease, stage 3 (moderate): Secondary | ICD-10-CM | POA: Diagnosis not present

## 2019-01-15 DIAGNOSIS — E785 Hyperlipidemia, unspecified: Secondary | ICD-10-CM | POA: Diagnosis not present

## 2019-01-15 DIAGNOSIS — E1122 Type 2 diabetes mellitus with diabetic chronic kidney disease: Secondary | ICD-10-CM | POA: Diagnosis not present

## 2019-02-06 DIAGNOSIS — M9971 Connective tissue and disc stenosis of intervertebral foramina of cervical region: Secondary | ICD-10-CM | POA: Diagnosis not present

## 2019-02-06 DIAGNOSIS — M4722 Other spondylosis with radiculopathy, cervical region: Secondary | ICD-10-CM | POA: Diagnosis not present

## 2019-02-06 DIAGNOSIS — M5020 Other cervical disc displacement, unspecified cervical region: Secondary | ICD-10-CM | POA: Diagnosis not present

## 2019-02-07 DIAGNOSIS — R296 Repeated falls: Secondary | ICD-10-CM | POA: Diagnosis not present

## 2019-02-07 DIAGNOSIS — Z79899 Other long term (current) drug therapy: Secondary | ICD-10-CM | POA: Diagnosis not present

## 2019-02-07 DIAGNOSIS — M542 Cervicalgia: Secondary | ICD-10-CM | POA: Diagnosis not present

## 2019-02-07 DIAGNOSIS — R42 Dizziness and giddiness: Secondary | ICD-10-CM | POA: Diagnosis not present

## 2019-03-07 DIAGNOSIS — R296 Repeated falls: Secondary | ICD-10-CM | POA: Diagnosis not present

## 2019-03-07 DIAGNOSIS — M542 Cervicalgia: Secondary | ICD-10-CM | POA: Diagnosis not present

## 2019-03-07 DIAGNOSIS — R42 Dizziness and giddiness: Secondary | ICD-10-CM | POA: Diagnosis not present

## 2019-03-07 DIAGNOSIS — I952 Hypotension due to drugs: Secondary | ICD-10-CM | POA: Diagnosis not present

## 2019-03-29 DIAGNOSIS — R42 Dizziness and giddiness: Secondary | ICD-10-CM | POA: Diagnosis not present

## 2019-03-29 DIAGNOSIS — I10 Essential (primary) hypertension: Secondary | ICD-10-CM | POA: Diagnosis not present

## 2019-04-03 DIAGNOSIS — R296 Repeated falls: Secondary | ICD-10-CM | POA: Diagnosis not present

## 2019-04-03 DIAGNOSIS — M542 Cervicalgia: Secondary | ICD-10-CM | POA: Diagnosis not present

## 2019-04-03 DIAGNOSIS — G2111 Neuroleptic induced parkinsonism: Secondary | ICD-10-CM | POA: Diagnosis not present

## 2019-04-03 DIAGNOSIS — I952 Hypotension due to drugs: Secondary | ICD-10-CM | POA: Diagnosis not present

## 2019-04-10 DIAGNOSIS — E785 Hyperlipidemia, unspecified: Secondary | ICD-10-CM | POA: Diagnosis not present

## 2019-04-10 DIAGNOSIS — N183 Chronic kidney disease, stage 3 (moderate): Secondary | ICD-10-CM | POA: Diagnosis not present

## 2019-04-10 DIAGNOSIS — E1129 Type 2 diabetes mellitus with other diabetic kidney complication: Secondary | ICD-10-CM | POA: Diagnosis not present

## 2019-04-10 DIAGNOSIS — M79672 Pain in left foot: Secondary | ICD-10-CM | POA: Diagnosis not present

## 2019-04-10 DIAGNOSIS — Z79899 Other long term (current) drug therapy: Secondary | ICD-10-CM | POA: Diagnosis not present

## 2019-04-10 DIAGNOSIS — D508 Other iron deficiency anemias: Secondary | ICD-10-CM | POA: Diagnosis not present

## 2019-04-10 DIAGNOSIS — E1140 Type 2 diabetes mellitus with diabetic neuropathy, unspecified: Secondary | ICD-10-CM | POA: Diagnosis not present

## 2019-04-10 DIAGNOSIS — M79671 Pain in right foot: Secondary | ICD-10-CM | POA: Diagnosis not present

## 2019-04-10 DIAGNOSIS — L110 Acquired keratosis follicularis: Secondary | ICD-10-CM | POA: Diagnosis not present

## 2019-04-17 ENCOUNTER — Other Ambulatory Visit: Payer: Self-pay

## 2019-04-17 DIAGNOSIS — D509 Iron deficiency anemia, unspecified: Secondary | ICD-10-CM | POA: Diagnosis not present

## 2019-04-17 DIAGNOSIS — N183 Chronic kidney disease, stage 3 (moderate): Secondary | ICD-10-CM | POA: Diagnosis not present

## 2019-04-17 DIAGNOSIS — E1122 Type 2 diabetes mellitus with diabetic chronic kidney disease: Secondary | ICD-10-CM | POA: Diagnosis not present

## 2019-04-17 NOTE — Patient Outreach (Signed)
[facility]) Care Management  04/17/2019  Linda English 03/12/1956 [id number]   Medication Adherence call to Linda English [employer] Verify spoke with patient she is past due on Metformin Er 500 mg patient explain she is taking 1 tablet 2 times daily patient received this medication from a local pharmacy now not from [facility] patient has medication at this time and will order when due from her local pharmacy.Linda English is showing past due under [place].  [place] Management Direct Dial [phone]  Fax [phone] [email or web address]

## 2019-04-20 DIAGNOSIS — R609 Edema, unspecified: Secondary | ICD-10-CM | POA: Diagnosis not present

## 2019-04-20 DIAGNOSIS — E1140 Type 2 diabetes mellitus with diabetic neuropathy, unspecified: Secondary | ICD-10-CM | POA: Diagnosis not present

## 2019-05-08 ENCOUNTER — Other Ambulatory Visit: Payer: Self-pay

## 2019-05-08 NOTE — Patient Outreach (Signed)
[facility]) Care Management  05/08/2019  Linda English Sep 15, 1955 [id number]   Medication Adherence call to [name] Compliant Voice message left with a call back number. [name] is showing past due on Metformin Er 500 mg and Benazepril 20 mg under [place].

## 2019-05-22 ENCOUNTER — Other Ambulatory Visit: Payer: Self-pay

## 2019-05-22 DIAGNOSIS — R6889 Other general symptoms and signs: Secondary | ICD-10-CM | POA: Diagnosis not present

## 2019-05-22 DIAGNOSIS — Z20822 Contact with and (suspected) exposure to covid-19: Secondary | ICD-10-CM

## 2019-05-23 LAB — NOVEL CORONAVIRUS, NAA: SARS-CoV-2, NAA: NOT DETECTED

## 2019-06-05 ENCOUNTER — Other Ambulatory Visit: Payer: Self-pay

## 2019-06-05 ENCOUNTER — Ambulatory Visit (INDEPENDENT_AMBULATORY_CARE_PROVIDER_SITE_OTHER): Payer: Medicare Other | Admitting: Gastroenterology

## 2019-06-05 ENCOUNTER — Encounter: Payer: Self-pay | Admitting: Gastroenterology

## 2019-06-05 ENCOUNTER — Encounter: Payer: Self-pay | Admitting: Internal Medicine

## 2019-06-05 DIAGNOSIS — R112 Nausea with vomiting, unspecified: Secondary | ICD-10-CM | POA: Diagnosis not present

## 2019-06-05 DIAGNOSIS — M4722 Other spondylosis with radiculopathy, cervical region: Secondary | ICD-10-CM | POA: Diagnosis not present

## 2019-06-05 MED ORDER — ONDANSETRON 4 MG PO TBDP: 4.0000 mg | ORAL_TABLET | Freq: Three times a day (TID) | ORAL | 2 refills | Status: DC

## 2019-06-05 MED ORDER — ONDANSETRON 4 MG PO TBDP: 4.0000 mg | ORAL_TABLET | Freq: Three times a day (TID) | ORAL | 2 refills | Status: DC | PRN

## 2019-06-05 NOTE — Patient Instructions (Signed)
Please make sure you are taking Protonix in the morning and evening on an empty stomach, at least 30 minutes before eating food.  I have sent in Zofran to your pharmacy to take every 8 hours as needed. Please monitor for constipation, as this could be a side effect.  Call us in a few days with an update on how you are doing! If any abdominal pain, please call us.  I enjoyed seeing you again today! As you know, I value our relationship and want to provide genuine, compassionate, and quality care. I welcome your feedback. If you receive a survey regarding your visit,  I greatly appreciate you taking time to fill this out. See you next time!  Annitta Needs, PhD, ANP-BC [facility] Gastroenterology

## 2019-06-05 NOTE — Progress Notes (Signed)
Primary Care Physician:  [name], MD  Primary GI: Dr. Gala Romney   Patient Location: Home   Provider Location: [facility] office   Reason for Visit: Follow-up    Persons present on the virtual encounter, with roles: Patient and NP   Total time (minutes) spent on medical discussion: 15 minutes   Due to COVID-19, visit was conducted using virtual method.  Visit was requested by patient.  Virtual Visit via Telephone Note Due to COVID-19, visit is conducted virtually and was requested by patient.   I connected with Linda English on 06/05/19 at 11:00 AM EDT by telephone and verified that I am speaking with the correct person using two identifiers.   I discussed the limitations, risks, security and privacy concerns of performing an evaluation and management service by telephone and the availability of in person appointments. I also discussed with the patient that there may be a patient responsible charge related to this service. The patient expressed understanding and agreed to proceed.  Chief Complaint  Patient presents with  . Emesis     History of Present Illness: 63 year old female with history of alternating constipation and diarrhea, rectal cancer s/p resection in 2003. Colonoscopy May 2016 with normal residual rectum and colon. Surgical anastomosis at approximately 5 cm from anal verge. 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 She underwent at least 7 sessions of biofeedback retraining. It has been quite difficult to manage bowel regimen, as medications will work for some time and then result in diarrhea.For the past few months, she has done well without prescriptive agents.   Vomiting for past 2 weeks. Stopped for 2 days and then started again. stools: 2 per day: soft,.. No fevers. Negative COVID per patient. Not related to eating. Tongue feels funny. Feels dry. On  amantadine.   Protonix BID. No abdominal pain. No sick contacts. Had eaten crab legs prior to symptoms. Vomiting once per day. Vomiting mid afternoon. Symptoms same. Drinking liquids. Vomiting is worst symptom. Feels like reflux may be worse than normal. Sometimes taking Protonix on an empty stomach, sometimes not.   Recently diagnosed with Parkinson's.    Past Medical History:  Diagnosis Date  . Acid reflux   . Allergic rhinitis   . Anxiety   . Arthritis    osteoarthritis  . Bipolar disorder ([place])   . Chronic headache   . Depression   . Diverticula of colon   . DM (diabetes mellitus) ([place])   . Gastric erosions   . Gastric polyps    benign   . Hemorrhoids   . Hiatal hernia   . Hypertension   . IBS (irritable bowel syndrome)   . Lichen planus   . Obstructive sleep apnea   . Panic disorder   . Rectal cancer ([place]) 2003   ileostomy and reversal  . Tubular adenoma      Past Surgical History:  Procedure Laterality Date  . ABDOMINAL HYSTERECTOMY    . APPENDECTOMY    . BACK SURGERY    . CESAREAN SECTION     X  2  . CHOLECYSTECTOMY    . COLONOSCOPY  08/2007   DR Gala Romney, friable anal canal hemorrhoids, surgical anastomosis at 3cm, distal scattered tics  . COLONOSCOPY  12/15/2010   anal papilla and internal hemorrhoids/diminutive polyp in the base of the cecum. Past, tubular adenoma.. Next colonoscopy due in April 2017.  Marland Kitchen COLONOSCOPY  10/27/2005  RMR: Anal canal hemorrhoids. Surgical anastomosis at 3 cm from the anal verge appeared normal. Few scattered distal diverticula. The residual  colonic mucosa appeared normal. I suspect the patient bled from hemorrhoids  . COLONOSCOPY N/A 01/15/2015   [id number] residual rectum and colon. next tcs 12/2019  . ESOPHAGEAL DILATION N/A 06/27/2015   Procedure: ESOPHAGEAL DILATION;  Surgeon: Daneil Dolin, MD;  Location: [facility];  Service: Endoscopy;  Laterality: N/A;  . ESOPHAGOGASTRODUODENOSCOPY  05/2002   DR ROURK, normal  .  ESOPHAGOGASTRODUODENOSCOPY  08/03/2011   RMR: small HH/ gastric polyps  . ESOPHAGOGASTRODUODENOSCOPY N/A 06/27/2015   Dr. Gala Romney: Mild erosive reflux esophagitits. Status post passage of a Maloney dilator. Hiatal hernia. Gastric polyps and abnormal gastric mucosa of doubtful clinical significane. status post biopsy, benign fundic gland polyp, negative H.pylori  . low anterior rection  2003   RECTAL CANCER  . SPINE SURGERY  2001   L5,S1 HEMILAMINOTOMY AND DISCECTOMY/DR DEATON     Current Meds  Medication Sig  . Amantadine HCl 100 MG tablet Take 1 tablet by mouth 2 (two) times daily.  Marland Kitchen amLODipine (NORVASC) 10 MG tablet Take 10 mg by mouth daily.  Marland Kitchen atorvastatin (LIPITOR) 10 MG tablet 1 tablet 3 (three) times a week.  . benazepril (LOTENSIN) 20 MG tablet Take 20 mg by mouth daily.  . chlorthalidone (HYGROTON) 25 MG tablet Take 25 mg by mouth daily.  . Cholecalciferol (VITAMIN D3) 1.25 MG (50000 UT) CAPS Take 1 tablet by mouth once a week. 350,000 IU  . clonazePAM (KLONOPIN) 1 MG tablet Take 1 mg by mouth 2 (two) times daily as needed for anxiety.   . lamoTRIgine (LAMICTAL) 200 MG tablet Take 200 mg by mouth 2 (two) times daily.   Marland Kitchen levETIRAcetam (KEPPRA) 500 MG tablet Take 1,500 mg by mouth at bedtime.   . metFORMIN (GLUCOPHAGE-XR) 500 MG 24 hr tablet Take 500 mg by mouth 2 (two) times daily.  . pantoprazole (PROTONIX) 40 MG tablet Take 40 mg by mouth 2 (two) times daily.  Marland Kitchen PRESCRIPTION MEDICATION Vitamin D 50000 by mouth once a week     Family History  Problem Relation Age of Onset  . Colon cancer Paternal Grandfather 40  . Colon polyps Brother   . Colon polyps Cousin     Social History   Socioeconomic History  . Marital status: Divorced    Spouse name: Not on file  . Number of children: Not on file  . Years of education: Not on file  . Highest education level: Not on file  Occupational History  . Occupation: Product manager: RETIRED    Comment: [employer]  . Financial resource strain: Not on file  . Food insecurity    Worry: Not on file    Inability: Not on file  . Transportation needs    Medical: Not on file    Non-medical: Not on file  Tobacco Use  . Smoking status: Never Smoker  . Smokeless tobacco: Never Used  . Tobacco comment: Never smoked  Substance and Sexual Activity  . Alcohol use: No  . Drug use: No  . Sexual activity: Never  Lifestyle  . Physical activity    Days per week: Not on file    Minutes per session: Not on file  . Stress: Not on file  Relationships  . Social Herbalist on phone: Not on file    Gets together: Not on file  Attends religious service: Not on file    Active member of club or organization: Not on file    Attends meetings of clubs or organizations: Not on file    Relationship status: Not on file  Other Topics Concern  . Not on file  Social History Narrative  . Not on file       Review of Systems: Gen: see HPI CV: Denies chest pain, palpitations, syncope, peripheral edema, and claudication. Resp: Denies dyspnea at rest, cough, wheezing, coughing up blood, and pleurisy. GI: see HPI Derm: Denies rash, itching, dry skin Psych: Denies depression, anxiety, memory loss, confusion. No homicidal or suicidal ideation.  Heme: Denies bruising, bleeding, and enlarged lymph nodes.  Observations/Objective: No distress. Video exam with patient appearing alert, comfortable, maintains eye contact. Does not appear dehydrated.   Assessment and Plan: 63 year old female with acute onset of N/V after eating crab legs, persistent for a few weeks. No diarrhea, remains afebrile, reports negative for COVID recently. Will treat empirically as likely dealt with gastroenteritis. Continue PPI BID, add Zofran as needed. Monitor for side effects. Call in a few days with update. If no improvement or abdominal pain, needs further diagnostic evaluation.   Follow Up Instructions: See AVS. Return in Dec  2020   I discussed the assessment and treatment plan with the patient. The patient was provided an opportunity to ask questions and all were answered. The patient agreed with the plan and demonstrated an understanding of the instructions.   The patient was advised to call back or seek an in-person evaluation if the symptoms worsen or if the condition fails to improve as anticipated.  I provided 15 minutes of face-to-face time during this encounter.  Annitta Needs, PhD, ANP-BC [facility] Gastroenterology

## 2019-06-11 ENCOUNTER — Other Ambulatory Visit: Payer: Self-pay | Admitting: Nurse Practitioner

## 2019-06-20 DIAGNOSIS — G2111 Neuroleptic induced parkinsonism: Secondary | ICD-10-CM | POA: Diagnosis not present

## 2019-06-20 DIAGNOSIS — R296 Repeated falls: Secondary | ICD-10-CM | POA: Diagnosis not present

## 2019-06-20 DIAGNOSIS — I952 Hypotension due to drugs: Secondary | ICD-10-CM | POA: Diagnosis not present

## 2019-06-20 DIAGNOSIS — Z79899 Other long term (current) drug therapy: Secondary | ICD-10-CM | POA: Diagnosis not present

## 2019-06-24 ENCOUNTER — Other Ambulatory Visit: Payer: Self-pay

## 2019-06-24 ENCOUNTER — Inpatient Hospital Stay (HOSPITAL_COMMUNITY)
Admission: EM | Admit: 2019-06-24 | Discharge: 2019-07-03 | DRG: 682 | Disposition: A | Discharge status: Skilled Nursing Facility | Payer: Medicare Other | Attending: Family Medicine | Admitting: Family Medicine

## 2019-06-24 ENCOUNTER — Emergency Department (HOSPITAL_COMMUNITY): Payer: Medicare Other

## 2019-06-24 ENCOUNTER — Encounter (HOSPITAL_COMMUNITY): Payer: Self-pay | Admitting: Emergency Medicine

## 2019-06-24 DIAGNOSIS — E876 Hypokalemia: Secondary | ICD-10-CM

## 2019-06-24 DIAGNOSIS — K219 Gastro-esophageal reflux disease without esophagitis: Secondary | ICD-10-CM | POA: Diagnosis not present

## 2019-06-24 DIAGNOSIS — R112 Nausea with vomiting, unspecified: Secondary | ICD-10-CM | POA: Diagnosis not present

## 2019-06-24 DIAGNOSIS — G20 Parkinson's disease: Secondary | ICD-10-CM | POA: Diagnosis present

## 2019-06-24 DIAGNOSIS — Z7984 Long term (current) use of oral hypoglycemic drugs: Secondary | ICD-10-CM

## 2019-06-24 DIAGNOSIS — F411 Generalized anxiety disorder: Secondary | ICD-10-CM | POA: Diagnosis present

## 2019-06-24 DIAGNOSIS — I10 Essential (primary) hypertension: Secondary | ICD-10-CM | POA: Diagnosis present

## 2019-06-24 DIAGNOSIS — Z800 Family history of malignant neoplasm of digestive organs: Secondary | ICD-10-CM

## 2019-06-24 DIAGNOSIS — Z8719 Personal history of other diseases of the digestive system: Secondary | ICD-10-CM

## 2019-06-24 DIAGNOSIS — G4733 Obstructive sleep apnea (adult) (pediatric): Secondary | ICD-10-CM | POA: Diagnosis not present

## 2019-06-24 DIAGNOSIS — R000 Tachycardia, unspecified: Secondary | ICD-10-CM | POA: Diagnosis not present

## 2019-06-24 DIAGNOSIS — Y92009 Unspecified place in unspecified non-institutional (private) residence as the place of occurrence of the external cause: Secondary | ICD-10-CM

## 2019-06-24 DIAGNOSIS — N183 Chronic kidney disease, stage 3 unspecified: Secondary | ICD-10-CM

## 2019-06-24 DIAGNOSIS — Z85048 Personal history of other malignant neoplasm of rectum, rectosigmoid junction, and anus: Secondary | ICD-10-CM

## 2019-06-24 DIAGNOSIS — Z23 Encounter for immunization: Secondary | ICD-10-CM | POA: Diagnosis not present

## 2019-06-24 DIAGNOSIS — F29 Unspecified psychosis not due to a substance or known physiological condition: Secondary | ICD-10-CM | POA: Diagnosis not present

## 2019-06-24 DIAGNOSIS — Z9049 Acquired absence of other specified parts of digestive tract: Secondary | ICD-10-CM | POA: Diagnosis not present

## 2019-06-24 DIAGNOSIS — E860 Dehydration: Secondary | ICD-10-CM

## 2019-06-24 DIAGNOSIS — F319 Bipolar disorder, unspecified: Secondary | ICD-10-CM | POA: Diagnosis present

## 2019-06-24 DIAGNOSIS — N179 Acute kidney failure, unspecified: Secondary | ICD-10-CM | POA: Diagnosis not present

## 2019-06-24 DIAGNOSIS — Z20828 Contact with and (suspected) exposure to other viral communicable diseases: Secondary | ICD-10-CM | POA: Diagnosis present

## 2019-06-24 DIAGNOSIS — W19XXXA Unspecified fall, initial encounter: Secondary | ICD-10-CM | POA: Diagnosis present

## 2019-06-24 DIAGNOSIS — Z90710 Acquired absence of both cervix and uterus: Secondary | ICD-10-CM

## 2019-06-24 DIAGNOSIS — Z8711 Personal history of peptic ulcer disease: Secondary | ICD-10-CM | POA: Diagnosis not present

## 2019-06-24 DIAGNOSIS — S065X9A Traumatic subdural hemorrhage with loss of consciousness of unspecified duration, initial encounter: Secondary | ICD-10-CM | POA: Diagnosis not present

## 2019-06-24 DIAGNOSIS — R110 Nausea: Secondary | ICD-10-CM | POA: Diagnosis not present

## 2019-06-24 DIAGNOSIS — E119 Type 2 diabetes mellitus without complications: Secondary | ICD-10-CM | POA: Diagnosis present

## 2019-06-24 DIAGNOSIS — F410 Panic disorder [episodic paroxysmal anxiety] without agoraphobia: Secondary | ICD-10-CM | POA: Diagnosis present

## 2019-06-24 DIAGNOSIS — Z9181 History of falling: Secondary | ICD-10-CM

## 2019-06-24 DIAGNOSIS — R634 Abnormal weight loss: Secondary | ICD-10-CM | POA: Diagnosis present

## 2019-06-24 DIAGNOSIS — S065XAA Traumatic subdural hemorrhage with loss of consciousness status unknown, initial encounter: Secondary | ICD-10-CM | POA: Diagnosis present

## 2019-06-24 DIAGNOSIS — Z888 Allergy status to other drugs, medicaments and biological substances status: Secondary | ICD-10-CM

## 2019-06-24 DIAGNOSIS — Z8371 Family history of colonic polyps: Secondary | ICD-10-CM

## 2019-06-24 DIAGNOSIS — R109 Unspecified abdominal pain: Secondary | ICD-10-CM | POA: Diagnosis not present

## 2019-06-24 DIAGNOSIS — Z79899 Other long term (current) drug therapy: Secondary | ICD-10-CM

## 2019-06-24 DIAGNOSIS — Z91013 Allergy to seafood: Secondary | ICD-10-CM

## 2019-06-24 DIAGNOSIS — Z886 Allergy status to analgesic agent status: Secondary | ICD-10-CM

## 2019-06-24 LAB — URINALYSIS, ROUTINE W REFLEX MICROSCOPIC
Bilirubin Urine: NEGATIVE
Glucose, UA: NEGATIVE mg/dL
Hgb urine dipstick: NEGATIVE
Ketones, ur: NEGATIVE mg/dL
Leukocytes,Ua: NEGATIVE
Nitrite: NEGATIVE
Protein, ur: NEGATIVE mg/dL
Specific Gravity, Urine: 1.013 (ref 1.005–1.030)
pH: 5 (ref 5.0–8.0)

## 2019-06-24 LAB — CBC WITH DIFFERENTIAL/PLATELET
Abs Immature Granulocytes: 0.06 10*3/uL (ref 0.00–0.07)
Basophils Absolute: 0 10*3/uL (ref 0.0–0.1)
Basophils Relative: 0 %
Eosinophils Absolute: 0.1 10*3/uL (ref 0.0–0.5)
Eosinophils Relative: 1 %
HCT: 36.1 % (ref 36.0–46.0)
Hemoglobin: 11.5 g/dL — ABNORMAL LOW (ref 12.0–15.0)
Immature Granulocytes: 1 %
Lymphocytes Relative: 32 %
Lymphs Abs: 1.6 10*3/uL (ref 0.7–4.0)
MCH: 27.2 pg (ref 26.0–34.0)
MCHC: 31.9 g/dL (ref 30.0–36.0)
MCV: 85.3 fL (ref 80.0–100.0)
Monocytes Absolute: 0.4 10*3/uL (ref 0.1–1.0)
Monocytes Relative: 7 %
Neutro Abs: 2.9 10*3/uL (ref 1.7–7.7)
Neutrophils Relative %: 59 %
Platelets: 358 10*3/uL (ref 150–400)
RBC: 4.23 MIL/uL (ref 3.87–5.11)
RDW: 13.2 % (ref 11.5–15.5)
WBC: 5 10*3/uL (ref 4.0–10.5)
nRBC: 0 % (ref 0.0–0.2)

## 2019-06-24 LAB — COMPREHENSIVE METABOLIC PANEL
ALT: 15 U/L (ref 0–44)
AST: 19 U/L (ref 15–41)
Albumin: 4.6 g/dL (ref 3.5–5.0)
Alkaline Phosphatase: 96 U/L (ref 38–126)
Anion gap: 18 — ABNORMAL HIGH (ref 5–15)
BUN: 53 mg/dL — ABNORMAL HIGH (ref 8–23)
CO2: 19 mmol/L — ABNORMAL LOW (ref 22–32)
Calcium: 10 mg/dL (ref 8.9–10.3)
Chloride: 102 mmol/L (ref 98–111)
Creatinine, Ser: 2.77 mg/dL — ABNORMAL HIGH (ref 0.44–1.00)
GFR calc Af Amer: 21 mL/min — ABNORMAL LOW (ref >60)
GFR calc non Af Amer: 18 mL/min — ABNORMAL LOW (ref >60)
Glucose, Bld: 125 mg/dL — ABNORMAL HIGH (ref 70–99)
Potassium: 2.9 mmol/L — ABNORMAL LOW (ref 3.5–5.1)
Sodium: 139 mmol/L (ref 135–145)
Total Bilirubin: 0.6 mg/dL (ref 0.3–1.2)
Total Protein: 7.8 g/dL (ref 6.5–8.1)

## 2019-06-24 LAB — GLUCOSE, CAPILLARY
Glucose-Capillary: 56 mg/dL — ABNORMAL LOW (ref 70–99)
Glucose-Capillary: 71 mg/dL (ref 70–99)

## 2019-06-24 LAB — TROPONIN I (HIGH SENSITIVITY): Troponin I (High Sensitivity): 5 ng/L (ref <18)

## 2019-06-24 LAB — LIPASE, BLOOD: Lipase: 42 U/L (ref 11–51)

## 2019-06-24 MED ORDER — BOOST / RESOURCE BREEZE PO LIQD CUSTOM
1.0000 | Freq: Three times a day (TID) | ORAL | Status: DC
  Administered 2019-06-25 – 2019-06-26 (×5): 1 via ORAL
  Administered 2019-06-27: 23:00:00 via ORAL
  Administered 2019-06-29 – 2019-07-03 (×11): 1 via ORAL

## 2019-06-24 MED ORDER — ATORVASTATIN CALCIUM 10 MG PO TABS
10.0000 mg | ORAL_TABLET | Freq: Every day | ORAL | Status: DC
  Administered 2019-06-25 – 2019-07-02 (×8): 10 mg via ORAL
  Filled 2019-06-24 (×8): qty 1

## 2019-06-24 MED ORDER — AMANTADINE HCL 100 MG PO CAPS
100.0000 mg | ORAL_CAPSULE | Freq: Two times a day (BID) | ORAL | Status: DC
  Administered 2019-06-25 – 2019-07-03 (×17): 100 mg via ORAL
  Filled 2019-06-24 (×23): qty 1

## 2019-06-24 MED ORDER — LEVETIRACETAM 500 MG PO TABS
1500.0000 mg | ORAL_TABLET | Freq: Every day | ORAL | Status: DC
  Administered 2019-06-25 – 2019-07-02 (×9): 1500 mg via ORAL
  Filled 2019-06-24 (×9): qty 3

## 2019-06-24 MED ORDER — ENOXAPARIN SODIUM 30 MG/0.3ML ~~LOC~~ SOLN
30.0000 mg | SUBCUTANEOUS | Status: DC
  Administered 2019-06-25 – 2019-06-30 (×6): 30 mg via SUBCUTANEOUS
  Filled 2019-06-24 (×8): qty 0.3

## 2019-06-24 MED ORDER — LAMOTRIGINE 100 MG PO TABS
200.0000 mg | ORAL_TABLET | Freq: Two times a day (BID) | ORAL | Status: DC
  Administered 2019-06-25 – 2019-07-03 (×17): 200 mg via ORAL
  Filled 2019-06-24 (×18): qty 2

## 2019-06-24 MED ORDER — SODIUM CHLORIDE 0.9 % IV BOLUS
1000.0000 mL | Freq: Once | INTRAVENOUS | Status: AC
  Administered 2019-06-24: 1000 mL via INTRAVENOUS

## 2019-06-24 MED ORDER — METOCLOPRAMIDE HCL 5 MG/ML IJ SOLN
10.0000 mg | Freq: Once | INTRAMUSCULAR | Status: AC
  Administered 2019-06-24: 10 mg via INTRAVENOUS
  Filled 2019-06-24: qty 2

## 2019-06-24 MED ORDER — INFLUENZA VAC SPLIT QUAD 0.5 ML IM SUSY
0.5000 mL | PREFILLED_SYRINGE | INTRAMUSCULAR | Status: AC
  Administered 2019-06-25: 0.5 mL via INTRAMUSCULAR
  Filled 2019-06-24: qty 0.5

## 2019-06-24 MED ORDER — INSULIN ASPART 100 UNIT/ML ~~LOC~~ SOLN
0.0000 [IU] | SUBCUTANEOUS | Status: DC
  Administered 2019-06-25: 1 [IU] via SUBCUTANEOUS
  Administered 2019-06-25: 2 [IU] via SUBCUTANEOUS
  Administered 2019-06-26 – 2019-06-28 (×2): 1 [IU] via SUBCUTANEOUS
  Administered 2019-06-28: 3 [IU] via SUBCUTANEOUS
  Administered 2019-06-29 – 2019-07-02 (×3): 1 [IU] via SUBCUTANEOUS

## 2019-06-24 MED ORDER — POTASSIUM CHLORIDE 10 MEQ/100ML IV SOLN
10.0000 meq | Freq: Once | INTRAVENOUS | Status: AC
  Administered 2019-06-24: 10 meq via INTRAVENOUS
  Filled 2019-06-24: qty 100

## 2019-06-24 MED ORDER — SODIUM CHLORIDE 0.9 % IV SOLN
INTRAVENOUS | Status: AC
  Administered 2019-06-24: 23:00:00 via INTRAVENOUS

## 2019-06-24 MED ORDER — IOHEXOL 9 MG/ML PO SOLN
ORAL | Status: AC
  Filled 2019-06-24: qty 500

## 2019-06-24 MED ORDER — CLONAZEPAM 0.5 MG PO TABS
1.0000 mg | ORAL_TABLET | Freq: Two times a day (BID) | ORAL | Status: DC | PRN
  Administered 2019-06-27: 1 mg via ORAL
  Filled 2019-06-24 (×2): qty 2

## 2019-06-24 MED ORDER — AMLODIPINE BESYLATE 5 MG PO TABS
10.0000 mg | ORAL_TABLET | Freq: Every day | ORAL | Status: DC
  Administered 2019-06-25 – 2019-06-30 (×6): 10 mg via ORAL
  Filled 2019-06-24 (×8): qty 2

## 2019-06-24 MED ORDER — PANTOPRAZOLE SODIUM 40 MG PO TBEC
40.0000 mg | DELAYED_RELEASE_TABLET | Freq: Two times a day (BID) | ORAL | Status: DC
  Administered 2019-06-25 – 2019-07-03 (×17): 40 mg via ORAL
  Filled 2019-06-24 (×18): qty 1

## 2019-06-24 NOTE — ED Triage Notes (Signed)
Pt with hx Parkinson's disease c/o increase in tremors and nausea/vomiting for 20 days. Pt saw Dr. Merlene Laughter and was given zofran with no relief. Pt endorses mild abdominal pain, but thinks it's due to dry heaving.

## 2019-06-24 NOTE — H&P (Addendum)
TRH H&P    Patient Demographics:    Linda English, is a 63 y.o. female  MRN: [id number]  DOB - 11-07-1955  Admit Date - 06/24/2019  Referring MD/NP/PA:   Evalee Jefferson  Outpatient Primary MD for the patient is [name], MD  Patient coming from:  home  Chief complaint- n/v   HPI:    Linda English  is a 63 y.o. female, Bipolar, hypertension, Dm2, Parkinsons disease, h/o rectal cancer, Gerd, Gastric erosions, presents with intractable n/v,  For the past 2 months .  At least 2 x per day.  Pt notes taking ibuprofen at times.  Pt noted diarrhea about 3 days ago.  Pt denies fever, chills, abd pain, brbpr,   In ED,  T 98.3, P 111, R 18, Bp 131/72 pox 99% on RA Wt 54.4  Na 139, K 2.9, Bun 53, Creatine 2.77 Wbc 5.0, Hgb 11.5, Plt 358 Lipase 42 Trop 5 Urinalysis negative  CT abd/ pelvis IMPRESSION: No acute abnormality to correspond with patient's given clinical history is noted.   Pt given ns 2L iv x1 in the ED. reglan 10mg  iv x1  Pt will be admitted for intractable n/v          Review of systems:    In addition to the HPI above,  No Fever-chills, No Headache, No changes with Vision or hearing, No problems swallowing food or Liquids, No Chest pain, Cough or Shortness of Breath, No Abdominal pain, No Blood in stool or Urine, No dysuria, No new skin rashes or bruises, No new joints pains-aches,  No new weakness, tingling, numbness in any extremity, No recent weight gain or loss, No polyuria, polydypsia or polyphagia, No significant Mental Stressors.  All other systems reviewed and are negative.    Past History of the following :    Past Medical History:  Diagnosis Date   Acid reflux    Allergic rhinitis    Anxiety    Arthritis    osteoarthritis   Bipolar disorder ([place])    Chronic headache    Depression    Diverticula of colon    DM (diabetes  mellitus) ([place])    Gastric erosions    Gastric polyps    benign    Hemorrhoids    Hiatal hernia    Hypertension    IBS (irritable bowel syndrome)    Lichen planus    Obstructive sleep apnea    Panic disorder    Parkinson's disease ([place])    Rectal cancer ([place]) 2003   ileostomy and reversal   Tubular adenoma       Past Surgical History:  Procedure Laterality Date   ABDOMINAL HYSTERECTOMY     APPENDECTOMY     BACK SURGERY     CESAREAN SECTION     X  2   CHOLECYSTECTOMY     COLONOSCOPY  08/2007   DR Gala Romney, friable anal canal hemorrhoids, surgical anastomosis at 3cm, distal scattered tics   COLONOSCOPY  12/15/2010   anal papilla and internal hemorrhoids/diminutive polyp in the base  of the cecum. Past, tubular adenoma.. Next colonoscopy due in April 2017.   COLONOSCOPY  10/27/2005   RMR: Anal canal hemorrhoids. Surgical anastomosis at 3 cm from the anal verge appeared normal. Few scattered distal diverticula. The residual  colonic mucosa appeared normal. I suspect the patient bled from hemorrhoids   COLONOSCOPY N/A 01/15/2015   [id number] residual rectum and colon. next tcs 12/2019   ESOPHAGEAL DILATION N/A 06/27/2015   Procedure: ESOPHAGEAL DILATION;  Surgeon: Daneil Dolin, MD;  Location: [facility];  Service: Endoscopy;  Laterality: N/A;   ESOPHAGOGASTRODUODENOSCOPY  05/2002   DR Gala Romney, normal   ESOPHAGOGASTRODUODENOSCOPY  08/03/2011   RMR: small HH/ gastric polyps   ESOPHAGOGASTRODUODENOSCOPY N/A 06/27/2015   Dr. Gala Romney: Mild erosive reflux esophagitits. Status post passage of a Maloney dilator. Hiatal hernia. Gastric polyps and abnormal gastric mucosa of doubtful clinical significane. status post biopsy, benign fundic gland polyp, negative H.pylori   low anterior rection  2003   RECTAL CANCER   SPINE SURGERY  2001   L5,S1 HEMILAMINOTOMY AND DISCECTOMY/DR DEATON      Social History:      Social History   Tobacco Use   Smoking status:  Never Smoker   Smokeless tobacco: Never Used   Tobacco comment: Never smoked  Substance Use Topics   Alcohol use: No       Family History :     Family History  Problem Relation Age of Onset   Colon cancer Paternal Grandfather 65   Colon polyps Brother    Colon polyps Cousin       Home Medications:   Prior to Admission medications   Medication Sig Start Date End Date Taking? Authorizing Provider  ondansetron (ZOFRAN ODT) 4 MG disintegrating tablet Take 1 tablet (4 mg total) by mouth every 8 (eight) hours as needed for nausea or vomiting. 06/05/19  Yes Annitta Needs, NP  Amantadine HCl 100 MG tablet Take 1 tablet by mouth 2 (two) times daily. 05/03/19   [provider]  amLODipine (NORVASC) 10 MG tablet Take 10 mg by mouth daily.    [provider]  atorvastatin (LIPITOR) 10 MG tablet 1 tablet 3 (three) times a week. 06/15/18   [provider]  benazepril (LOTENSIN) 20 MG tablet Take 20 mg by mouth daily. 01/21/17   [provider]  chlorthalidone (HYGROTON) 25 MG tablet Take 25 mg by mouth daily. 09/20/16   [provider]  Cholecalciferol (VITAMIN D3) 1.25 MG (50000 UT) CAPS Take 1 tablet by mouth once a week. 350,000 IU 11/07/18   [provider]  clonazePAM (KLONOPIN) 1 MG tablet Take 1 mg by mouth 2 (two) times daily as needed for anxiety.     [provider]  lamoTRIgine (LAMICTAL) 200 MG tablet Take 200 mg by mouth 2 (two) times daily.  06/11/13   [provider]  levETIRAcetam (KEPPRA) 500 MG tablet Take 1,500 mg by mouth at bedtime.  09/15/15   [provider]  metFORMIN (GLUCOPHAGE-XR) 500 MG 24 hr tablet Take 500 mg by mouth 2 (two) times daily. 06/13/12   Darrol Jump, MD  pantoprazole (PROTONIX) 40 MG tablet TAKE 1 TABLET BY MOUTH TWICE DAILY 06/13/19   Erenest Rasher, PA-C  PRESCRIPTION MEDICATION Vitamin D 50000 by mouth once a week    [provider]     Allergies:      Allergies  Allergen Reactions   Shellfish Allergy Anaphylaxis   Lithium Nausea And Vomiting  Excedrin Extra Strength [Aspirin-Acetaminophen-Caffeine] Other (See Comments)    Makes her nervous.     Physical Exam:   Vitals  Blood pressure 139/70, pulse 93, temperature 98.2 F (36.8 C), temperature source Oral, resp. rate 19, height 5\' 2"  (1.575 m), weight 54.4 kg, SpO2 100 %.  1.  General: axoxo3  2. Psychiatric: euthymic  3. Neurologic: cn2-12 intact, reflexes 2+ symmetric, diffuse with no clonus, motor 5/5 in all 4 ext, + resting tremor  4. HEENMT:  Anicteric, pupils 1.31mm symmetric, direct, consensual, near intact Neck: no jvd  5. Respiratory : CTAB  6. Cardiovascular : rrr s1, s2, no m/g/r  7. Gastrointestinal:  Abd: soft, nt, nd, +bs  8. Skin:  Ext: no c/c/e,   9.Musculoskeletal:  Good ROM    Data Review:    CBC Recent Labs  Lab 06/24/19 1700  WBC 5.0  HGB 11.5*  HCT 36.1  PLT 358  MCV 85.3  MCH 27.2  MCHC 31.9  RDW 13.2  LYMPHSABS 1.6  MONOABS 0.4  EOSABS 0.1  BASOSABS 0.0   ------------------------------------------------------------------------------------------------------------------  Results for orders placed or performed during the hospital encounter of 06/24/19 (from the past 48 hour(s))  CBC with Differential     Status: Abnormal   Collection Time: 06/24/19  5:00 PM  Result Value Ref Range   WBC 5.0 4.0 - 10.5 K/uL   RBC 4.23 3.87 - 5.11 MIL/uL   Hemoglobin 11.5 (L) 12.0 - 15.0 g/dL   HCT 36.1 36.0 - 46.0 %   MCV 85.3 80.0 - 100.0 fL   MCH 27.2 26.0 - 34.0 pg   MCHC 31.9 30.0 - 36.0 g/dL   RDW 13.2 11.5 - 15.5 %   Platelets 358 150 - 400 K/uL   nRBC 0.0 0.0 - 0.2 %   Neutrophils Relative % 59 %   Neutro Abs 2.9 1.7 - 7.7 K/uL   Lymphocytes Relative 32 %   Lymphs Abs 1.6 0.7 - 4.0 K/uL   Monocytes Relative 7 %   Monocytes Absolute 0.4 0.1 - 1.0 K/uL   Eosinophils Relative 1 %   Eosinophils Absolute 0.1 0.0 - 0.5  K/uL   Basophils Relative 0 %   Basophils Absolute 0.0 0.0 - 0.1 K/uL   Immature Granulocytes 1 %   Abs Immature Granulocytes 0.06 0.00 - 0.07 K/uL    Comment: Performed at [facility], [address] [place], [place] [address]  Comprehensive metabolic panel     Status: Abnormal   Collection Time: 06/24/19  5:00 PM  Result Value Ref Range   Sodium 139 135 - 145 mmol/L   Potassium 2.9 (L) 3.5 - 5.1 mmol/L   Chloride 102 98 - 111 mmol/L   CO2 19 (L) 22 - 32 mmol/L   Glucose, Bld 125 (H) 70 - 99 mg/dL   BUN 53 (H) 8 - 23 mg/dL   Creatinine, Ser 2.77 (H) 0.44 - 1.00 mg/dL   Calcium 10.0 8.9 - 10.3 mg/dL   Total Protein 7.8 6.5 - 8.1 g/dL   Albumin 4.6 3.5 - 5.0 g/dL   AST 19 15 - 41 U/L   ALT 15 0 - 44 U/L   Alkaline Phosphatase 96 38 - 126 U/L   Total Bilirubin 0.6 0.3 - 1.2 mg/dL   GFR calc non Af Amer 18 (L) >60 mL/min   GFR calc Af Amer 21 (L) >60 mL/min   Anion gap 18 (H) 5 - 15    Comment: Performed at [facility], [address], [place], [place] [address]  Lipase, blood     Status: None   Collection Time: 06/24/19  5:00 PM  Result Value Ref Range   Lipase 42 11 - 51 U/L    Comment: Performed at [facility], [address] [place], [place] [address]  Troponin I (High Sensitivity)     Status: None   Collection Time: 06/24/19  5:00 PM  Result Value Ref Range   Troponin I (High Sensitivity) 5 <18 ng/L    Comment: (NOTE) Elevated high sensitivity troponin I (hsTnI) values and significant  changes across serial measurements may suggest ACS but many other  chronic and acute conditions are known to elevate hsTnI results.  Refer to the "Links" section for chest pain algorithms and additional  guidance. Performed at [facility], [address], [place], [place] [address]   Urinalysis, Routine w reflex microscopic     Status: None   Collection Time: 06/24/19  6:30 PM  Result Value Ref Range   Color, Urine YELLOW YELLOW   APPearance CLEAR CLEAR   Specific Gravity, Urine  1.013 1.005 - 1.030   pH 5.0 5.0 - 8.0   Glucose, UA NEGATIVE NEGATIVE mg/dL   Hgb urine dipstick NEGATIVE NEGATIVE   Bilirubin Urine NEGATIVE NEGATIVE   Ketones, ur NEGATIVE NEGATIVE mg/dL   Protein, ur NEGATIVE NEGATIVE mg/dL   Nitrite NEGATIVE NEGATIVE   Leukocytes,Ua NEGATIVE NEGATIVE    Comment: Performed at [facility], [address] [place], [place] [address]    Chemistries  Recent Labs  Lab 06/24/19 1700  NA 139  K 2.9*  CL 102  CO2 19*  GLUCOSE 125*  BUN 53*  CREATININE 2.77*  CALCIUM 10.0  AST 19  ALT 15  ALKPHOS 96  BILITOT 0.6   ------------------------------------------------------------------------------------------------------------------  ------------------------------------------------------------------------------------------------------------------ GFR: Estimated Creatinine Clearance: 16.9 mL/min (A) (by C-G formula based on SCr of 2.77 mg/dL (H)). Liver Function Tests: Recent Labs  Lab 06/24/19 1700  AST 19  ALT 15  ALKPHOS 96  BILITOT 0.6  PROT 7.8  ALBUMIN 4.6   Recent Labs  Lab 06/24/19 1700  LIPASE 42   No results for input(s): AMMONIA in the last 168 hours. Coagulation Profile: No results for input(s): INR, PROTIME in the last 168 hours. Cardiac Enzymes: No results for input(s): CKTOTAL, CKMB, CKMBINDEX, TROPONINI in the last 168 hours. BNP (last 3 results) No results for input(s): PROBNP in the last 8760 hours. HbA1C: No results for input(s): HGBA1C in the last 72 hours. CBG: No results for input(s): GLUCAP in the last 168 hours. Lipid Profile: No results for input(s): CHOL, HDL, LDLCALC, TRIG, CHOLHDL, LDLDIRECT in the last 72 hours. Thyroid Function Tests: No results for input(s): TSH, T4TOTAL, FREET4, T3FREE, THYROIDAB in the last 72 hours. Anemia Panel: No results for input(s): VITAMINB12, FOLATE, FERRITIN, TIBC, IRON, RETICCTPCT in the last 72  hours.  --------------------------------------------------------------------------------------------------------------- Urine analysis:    Component Value Date/Time   COLORURINE YELLOW 06/24/2019 1830   APPEARANCEUR CLEAR 06/24/2019 1830   LABSPEC 1.013 06/24/2019 1830   PHURINE 5.0 06/24/2019 1830   GLUCOSEU NEGATIVE 06/24/2019 1830   HGBUR NEGATIVE 06/24/2019 1830   BILIRUBINUR NEGATIVE 06/24/2019 [place] 06/24/2019 1830   PROTEINUR NEGATIVE 06/24/2019 1830   UROBILINOGEN 1.0 06/07/2012 2238   NITRITE NEGATIVE 06/24/2019 1830   LEUKOCYTESUR NEGATIVE 06/24/2019 1830      Imaging Results:    Ct Abdomen Pelvis Wo Contrast  Result Date: 06/24/2019 CLINICAL DATA:  Nausea and vomiting with  weight loss and abdominal pain, initial encounter EXAM: CT ABDOMEN AND PELVIS WITHOUT CONTRAST TECHNIQUE: Multidetector CT imaging of the abdomen and pelvis was performed following the standard protocol without IV contrast. COMPARISON:  10/19/2013 FINDINGS: Lower chest: No acute abnormality. Hepatobiliary: No focal liver abnormality is seen. Status post cholecystectomy. No biliary dilatation. Pancreas: Unremarkable. No pancreatic ductal dilatation or surrounding inflammatory changes. Spleen: Normal in size without focal abnormality. Adrenals/Urinary Tract: Adrenal glands are within normal limits. Kidneys are well visualized bilaterally. No renal calculi or obstructive changes are seen. The bladder is partially distended. Stomach/Bowel: Postsurgical changes are noted consistent with lower anterior colonic resection. No obstructive changes are seen. The anastomosis is widely patent. The appendix has been surgically removed. No small bowel or gastric abnormality is seen. Vascular/Lymphatic: Aortic atherosclerosis. No enlarged abdominal or pelvic lymph nodes. Reproductive: Status post hysterectomy. No adnexal masses. Other: No abdominal wall hernia or abnormality. No abdominopelvic ascites.  Musculoskeletal: Degenerative changes of lumbar spine are noted. IMPRESSION: No acute abnormality to correspond with patient's given clinical history is noted. Electronically Signed   By: Inez Catalina M.D.   On: 06/24/2019 20:38       Assessment & Plan:    Principal Problem:   ARF (acute renal failure) (HCC) Active Problems:   Diabetes (HCC)   Nausea & vomiting  ARF STOP Lotensin STOP Chlorthalidone NO NSAIDS Hydrate with ns iv Check cmp in am  Hypokalemia Replete Check cmp in am  Intractable nausea / vomitting, consider diabetic gastroparesis STOP Metformin  zofran 4mg  iv q6h prn  Gastroenterology consult   Gerd  cont PPI  Hypertension Cont Lotensin, Chlorthalidone as above Cont Amlodipine 10mg  po qday  DM2 fsbs ac and qhs, ISS  Parkinsons Cont Amantidine  Anxiety/ Bipolar disorder Cont Clonazepam 1mg  po bid Cont Lamictal Cont Keppra  DVT Prophylaxis-   Lovenox - SCDs   AM Labs Ordered, also please review Full Orders  Family Communication: Admission, patients condition and plan of care including tests being ordered have been discussed with the patient  who indicate understanding and agree with the plan and Code Status.  Code Status:  FULL CODE per patient  Admission status: Observation: Based on patients clinical presentation and evaluation of above clinical data, I have made determination that patient meets observation criteria at this time.  Time spent in minutes : 70 minutes   Jani Gravel M.D on 06/24/2019 at 9:04 PM

## 2019-06-24 NOTE — ED Provider Notes (Signed)
[facility] EMERGENCY DEPARTMENT Provider Note   CSN: [id number] Arrival date & time: 06/24/19  1615     History   Chief Complaint Chief Complaint  Patient presents with  . Nausea    HPI Linda English is a 63 y.o. female with a history of GERD, DM, h/o IBS, and history of rectal cancer with  Ileostomy and reversal, other significant surgeries including cholecystectomy, appendectomy and total hysterectomy who was recently diagnosed with Parkinsons,  presenting with nausea and vomiting for the past 20+ days.  She was seen by her GI specialist early this month and placed on zofran which has not completely improved her nausea symptoms, stating she takes this medicine every 8 hours, but is only effective for about 4-5 hours. She reports a 30 lb weight loss since April. She denies abdominal pain, but has vague upper abdominal soreness and chest wall soreness which she suspects is muscle soreness from vomiting, this has been present for days and worsened with movement.  She has had no fevers or chills.  She endorses generalized weakness.  Her last bm was yesterday and soft in character, nonbloody.      The history is provided by the patient.    Past Medical History:  Diagnosis Date  . Acid reflux   . Allergic rhinitis   . Anxiety   . Arthritis    osteoarthritis  . Bipolar disorder ([place])   . Chronic headache   . Depression   . Diverticula of colon   . DM (diabetes mellitus) ([place])   . Gastric erosions   . Gastric polyps    benign   . Hemorrhoids   . Hiatal hernia   . Hypertension   . IBS (irritable bowel syndrome)   . Lichen planus   . Obstructive sleep apnea   . Panic disorder   . Parkinson's disease ([place])   . Rectal cancer ([place]) 2003   ileostomy and reversal  . Tubular adenoma     Patient Active Problem List   Diagnosis Date Noted  . Nausea and vomiting 06/05/2019  . Diarrhea 07/27/2017  . Transient ischemic attack 08/09/2015  . Reflux esophagitis   .  Gastric polyp   . Dyspepsia 06/13/2015  . Dysphagia 06/13/2015  . Incisional irritation 06/18/2013  . Wound drainage 06/18/2013  . Bipolar 1 disorder ([place]) 06/08/2012    Class: Chronic  . Anorexia 05/23/2012  . Insomnia 05/23/2012  . Weight loss 05/23/2012  . Weight loss, abnormal 04/10/2012  . Esophageal dysphagia 04/10/2012  . Nausea 09/08/2011  . GERD (gastroesophageal reflux disease) 07/14/2011  . Epigastric pain 07/14/2011  . Constipation 07/14/2011  . Knee pain 02/09/2011  . OA (osteoarthritis) of knee 02/09/2011  . Family hx of colon cancer 11/30/2010  . Bowel habit changes 11/30/2010  . OSTEOARTHRITIS, KNEE 08/05/2010  . PES PLANUS 08/05/2010  . RECTAL CANCER 10/23/2007  . Diabetes ([place]) 10/23/2007  . HYPERLIPIDEMIA 10/23/2007  . ANXIETY 10/23/2007  . PANIC DISORDER 10/23/2007  . DEPRESSION 10/23/2007  . OBSTRUCTIVE SLEEP APNEA 10/23/2007  . Essential hypertension 10/23/2007  . ALLERGIC RHINITIS 10/23/2007  . IBS 10/23/2007  . HEADACHE, CHRONIC 10/23/2007    Past Surgical History:  Procedure Laterality Date  . ABDOMINAL HYSTERECTOMY    . APPENDECTOMY    . BACK SURGERY    . CESAREAN SECTION     X  2  . CHOLECYSTECTOMY    . COLONOSCOPY  08/2007   DR Gala Romney, friable anal canal hemorrhoids, surgical anastomosis at 3cm, distal scattered  tics  . COLONOSCOPY  12/15/2010   anal papilla and internal hemorrhoids/diminutive polyp in the base of the cecum. Past, tubular adenoma.. Next colonoscopy due in April 2017.  Marland Kitchen COLONOSCOPY  10/27/2005   RMR: Anal canal hemorrhoids. Surgical anastomosis at 3 cm from the anal verge appeared normal. Few scattered distal diverticula. The residual  colonic mucosa appeared normal. I suspect the patient bled from hemorrhoids  . COLONOSCOPY N/A 01/15/2015   [id number] residual rectum and colon. next tcs 12/2019  . ESOPHAGEAL DILATION N/A 06/27/2015   Procedure: ESOPHAGEAL DILATION;  Surgeon: Daneil Dolin, MD;  Location: [facility] ENDO SUITE;   Service: Endoscopy;  Laterality: N/A;  . ESOPHAGOGASTRODUODENOSCOPY  05/2002   DR ROURK, normal  . ESOPHAGOGASTRODUODENOSCOPY  08/03/2011   RMR: small HH/ gastric polyps  . ESOPHAGOGASTRODUODENOSCOPY N/A 06/27/2015   Dr. Gala Romney: Mild erosive reflux esophagitits. Status post passage of a Maloney dilator. Hiatal hernia. Gastric polyps and abnormal gastric mucosa of doubtful clinical significane. status post biopsy, benign fundic gland polyp, negative H.pylori  . low anterior rection  2003   RECTAL CANCER  . SPINE SURGERY  2001   L5,S1 HEMILAMINOTOMY AND DISCECTOMY/DR DEATON     OB History    Gravida      Para      Term      Preterm      AB      Living  2     SAB      TAB      Ectopic      Multiple      Live Births               Home Medications    Prior to Admission medications   Medication Sig Start Date End Date Taking? Authorizing Provider  ondansetron (ZOFRAN ODT) 4 MG disintegrating tablet Take 1 tablet (4 mg total) by mouth every 8 (eight) hours as needed for nausea or vomiting. 06/05/19  Yes Annitta Needs, NP  Amantadine HCl 100 MG tablet Take 1 tablet by mouth 2 (two) times daily. 05/03/19   [provider]  amLODipine (NORVASC) 10 MG tablet Take 10 mg by mouth daily.    [provider]  atorvastatin (LIPITOR) 10 MG tablet 1 tablet 3 (three) times a week. 06/15/18   [provider]  benazepril (LOTENSIN) 20 MG tablet Take 20 mg by mouth daily. 01/21/17   [provider]  chlorthalidone (HYGROTON) 25 MG tablet Take 25 mg by mouth daily. 09/20/16   [provider]  Cholecalciferol (VITAMIN D3) 1.25 MG (50000 UT) CAPS Take 1 tablet by mouth once a week. 350,000 IU 11/07/18   [provider]  clonazePAM (KLONOPIN) 1 MG tablet Take 1 mg by mouth 2 (two) times daily as needed for anxiety.     [provider]  lamoTRIgine (LAMICTAL) 200 MG tablet Take 200 mg by mouth 2 (two) times daily.  06/11/13   [provider]  levETIRAcetam (KEPPRA) 500 MG tablet Take 1,500 mg by mouth at bedtime.  09/15/15   [provider]  metFORMIN (GLUCOPHAGE-XR) 500 MG 24 hr tablet Take 500 mg by mouth 2 (two) times daily. 06/13/12   Darrol Jump, MD  pantoprazole (PROTONIX) 40 MG tablet TAKE 1 TABLET BY MOUTH TWICE DAILY 06/13/19   Erenest Rasher, PA-C  PRESCRIPTION MEDICATION Vitamin D 50000 by mouth once a week    [provider]    Family History Family History  Problem Relation Age of  Onset  . Colon cancer Paternal Grandfather 16  . Colon polyps Brother   . Colon polyps Cousin     Social History Social History   Tobacco Use  . Smoking status: Never Smoker  . Smokeless tobacco: Never Used  . Tobacco comment: Never smoked  Substance Use Topics  . Alcohol use: No  . Drug use: No     Allergies   Shellfish allergy, Lithium, and Excedrin extra strength [aspirin-acetaminophen-caffeine]   Review of Systems Review of Systems  Constitutional: Positive for appetite change and unexpected weight change.  HENT: Negative.   Respiratory: Negative.   Cardiovascular: Negative.  Negative for palpitations and leg swelling.  Gastrointestinal: Positive for nausea and vomiting.  Genitourinary: Negative.   Musculoskeletal: Negative.   Skin: Negative.      Physical Exam Updated Vital Signs BP 139/79   Pulse (!) 102   Temp 98.2 F (36.8 C) (Oral)   Resp 19   Ht 5\' 2"  (1.575 m)   Wt 54.4 kg   SpO2 97%   BMI 21.95 kg/m   Physical Exam Vitals signs and nursing note reviewed.  Constitutional:      Appearance: She is well-developed.  HENT:     Head: Normocephalic and atraumatic.  Eyes:     Conjunctiva/sclera: Conjunctivae normal.  Neck:     Musculoskeletal: Normal range of motion.  Cardiovascular:     Rate and Rhythm: Normal rate and regular rhythm.     Heart sounds: Normal heart sounds.  Pulmonary:     Effort: Pulmonary effort is normal.     Breath sounds: Normal  breath sounds. No wheezing.  Abdominal:     General: Bowel sounds are normal.     Palpations: Abdomen is soft.     Tenderness: There is no abdominal tenderness.  Musculoskeletal: Normal range of motion.  Skin:    General: Skin is warm and dry.  Neurological:     Mental Status: She is alert.      ED Treatments / Results  Labs (all labs ordered are listed, but only abnormal results are displayed) Labs Reviewed  CBC WITH DIFFERENTIAL/PLATELET - Abnormal; Notable for the following components:      Result Value   Hemoglobin 11.5 (*)    All other components within normal limits  COMPREHENSIVE METABOLIC PANEL - Abnormal; Notable for the following components:   Potassium 2.9 (*)    CO2 19 (*)    Glucose, Bld 125 (*)    BUN 53 (*)    Creatinine, Ser 2.77 (*)    GFR calc non Af Amer 18 (*)    GFR calc Af Amer 21 (*)    Anion gap 18 (*)    All other components within normal limits  SARS CORONAVIRUS 2 (TAT 6-24 HRS)  LIPASE, BLOOD  URINALYSIS, ROUTINE W REFLEX MICROSCOPIC  TROPONIN I (HIGH SENSITIVITY)    EKG None  Radiology Ct Abdomen Pelvis Wo Contrast  Result Date: 06/24/2019 CLINICAL DATA:  Nausea and vomiting with weight loss and abdominal pain, initial encounter EXAM: CT ABDOMEN AND PELVIS WITHOUT CONTRAST TECHNIQUE: Multidetector CT imaging of the abdomen and pelvis was performed following the standard protocol without IV contrast. COMPARISON:  10/19/2013 FINDINGS: Lower chest: No acute abnormality. Hepatobiliary: No focal liver abnormality is seen. Status post cholecystectomy. No biliary dilatation. Pancreas: Unremarkable. No pancreatic ductal dilatation or surrounding inflammatory changes. Spleen: Normal in size without focal abnormality. Adrenals/Urinary Tract: Adrenal glands are within normal limits. Kidneys are well visualized bilaterally. No renal calculi  or obstructive changes are seen. The bladder is partially distended. Stomach/Bowel: Postsurgical changes are noted  consistent with lower anterior colonic resection. No obstructive changes are seen. The anastomosis is widely patent. The appendix has been surgically removed. No small bowel or gastric abnormality is seen. Vascular/Lymphatic: Aortic atherosclerosis. No enlarged abdominal or pelvic lymph nodes. Reproductive: Status post hysterectomy. No adnexal masses. Other: No abdominal wall hernia or abnormality. No abdominopelvic ascites. Musculoskeletal: Degenerative changes of lumbar spine are noted. IMPRESSION: No acute abnormality to correspond with patient's given clinical history is noted. Electronically Signed   By: Inez Catalina M.D.   On: 06/24/2019 20:38    Procedures Procedures (including critical care time)  Medications Ordered in ED Medications  iohexol (OMNIPAQUE) 9 MG/ML oral solution (has no administration in time range)  sodium chloride 0.9 % bolus 1,000 mL (0 mLs Intravenous Stopped 06/24/19 1812)  metoCLOPramide (REGLAN) injection 10 mg (10 mg Intravenous Given 06/24/19 1726)  potassium chloride 10 mEq in 100 mL IVPB (0 mEq Intravenous Stopped 06/24/19 1916)  sodium chloride 0.9 % bolus 1,000 mL (0 mLs Intravenous Stopped 06/24/19 1941)     Initial Impression / Assessment and Plan / ED Course  I have reviewed the triage vital signs and the nursing notes.  Pertinent labs & imaging results that were available during my care of the patient were reviewed by me and considered in my medical decision making (see chart for details).        Pt with n/v, dehydration due to poor PO intake, with acute kidney failure - creatinine 2.77 with comparison creatinine 1.12 in 2018.  Also with acute hypokalemia, probably due to hyperemesis.  She was given IV fluids, IV potassium also given.  CT imaging obtained given nausea and sig 30 lb weight loss since April.  Negative Ct imaging, this was a noncontrast study (oral only) given elevated creatinine.  Pt will benefit from admission for correction of renal  function, further IV fluids. She was given Reglan here which improved the nausea.   Discussed with Dr. Maudie Mercury who agrees with admission.  Final Clinical Impressions(s) / ED Diagnoses   Final diagnoses:  Acute kidney injury (HCC)  Non-intractable vomiting with nausea, unspecified vomiting type  Dehydration  Hypokalemia    ED Discharge Orders    None       Landis Martins 06/24/19 2059    Milton Ferguson, MD 06/26/19 1154

## 2019-06-24 NOTE — ED Notes (Signed)
Purewick placed on pt. 

## 2019-06-24 NOTE — ED Notes (Signed)
Pt has not vomited since Reglan was given. Pt says nausea has resolved.

## 2019-06-24 NOTE — ED Notes (Signed)
Patient's daughter would look to be notified/contacted if she is needed or if there are any important changes in patient's care (admission). Montrese [phone].

## 2019-06-24 NOTE — ED Notes (Addendum)
Pt assisted with bedpan, pt walked to bathroom earlier with nurse, pt has unsteady gait and very shaky. Pt did well with using bedpan

## 2019-06-25 ENCOUNTER — Telehealth: Payer: Self-pay

## 2019-06-25 DIAGNOSIS — N179 Acute kidney failure, unspecified: Secondary | ICD-10-CM | POA: Diagnosis not present

## 2019-06-25 DIAGNOSIS — R112 Nausea with vomiting, unspecified: Secondary | ICD-10-CM | POA: Diagnosis not present

## 2019-06-25 LAB — COMPREHENSIVE METABOLIC PANEL
ALT: 14 U/L (ref 0–44)
AST: 12 U/L — ABNORMAL LOW (ref 15–41)
Albumin: 3.9 g/dL (ref 3.5–5.0)
Alkaline Phosphatase: 81 U/L (ref 38–126)
Anion gap: 12 (ref 5–15)
BUN: 30 mg/dL — ABNORMAL HIGH (ref 8–23)
CO2: 21 mmol/L — ABNORMAL LOW (ref 22–32)
Calcium: 9.2 mg/dL (ref 8.9–10.3)
Chloride: 109 mmol/L (ref 98–111)
Creatinine, Ser: 1.65 mg/dL — ABNORMAL HIGH (ref 0.44–1.00)
GFR calc Af Amer: 38 mL/min — ABNORMAL LOW (ref >60)
GFR calc non Af Amer: 33 mL/min — ABNORMAL LOW (ref >60)
Glucose, Bld: 79 mg/dL (ref 70–99)
Potassium: 3 mmol/L — ABNORMAL LOW (ref 3.5–5.1)
Sodium: 142 mmol/L (ref 135–145)
Total Bilirubin: 0.7 mg/dL (ref 0.3–1.2)
Total Protein: 6.8 g/dL (ref 6.5–8.1)

## 2019-06-25 LAB — CBC
HCT: 33.1 % — ABNORMAL LOW (ref 36.0–46.0)
Hemoglobin: 10.4 g/dL — ABNORMAL LOW (ref 12.0–15.0)
MCH: 27 pg (ref 26.0–34.0)
MCHC: 31.4 g/dL (ref 30.0–36.0)
MCV: 86 fL (ref 80.0–100.0)
Platelets: 289 10*3/uL (ref 150–400)
RBC: 3.85 MIL/uL — ABNORMAL LOW (ref 3.87–5.11)
RDW: 13.2 % (ref 11.5–15.5)
WBC: 4.8 10*3/uL (ref 4.0–10.5)
nRBC: 0 % (ref 0.0–0.2)

## 2019-06-25 LAB — SARS CORONAVIRUS 2 (TAT 6-24 HRS): SARS Coronavirus 2: NEGATIVE

## 2019-06-25 LAB — GLUCOSE, CAPILLARY
Glucose-Capillary: 101 mg/dL — ABNORMAL HIGH (ref 70–99)
Glucose-Capillary: 97 mg/dL (ref 70–99)
Glucose-Capillary: 128 mg/dL — ABNORMAL HIGH (ref 70–99)
Glucose-Capillary: 154 mg/dL — ABNORMAL HIGH (ref 70–99)
Glucose-Capillary: 69 mg/dL — ABNORMAL LOW (ref 70–99)
Glucose-Capillary: 84 mg/dL (ref 70–99)

## 2019-06-25 LAB — HIV ANTIBODY (ROUTINE TESTING W REFLEX): HIV Screen 4th Generation wRfx: NONREACTIVE

## 2019-06-25 MED ORDER — POTASSIUM CHLORIDE IN NACL 20-0.9 MEQ/L-% IV SOLN
INTRAVENOUS | Status: DC
  Administered 2019-06-25 – 2019-06-30 (×6): via INTRAVENOUS

## 2019-06-25 MED ORDER — POTASSIUM CHLORIDE CRYS ER 20 MEQ PO TBCR
40.0000 meq | EXTENDED_RELEASE_TABLET | ORAL | Status: AC
  Administered 2019-06-25 (×2): 40 meq via ORAL
  Filled 2019-06-25 (×2): qty 2

## 2019-06-25 NOTE — Telephone Encounter (Signed)
PA request received from [email or web address]. PA submitted. Waiting on an approval or denial.

## 2019-06-25 NOTE — Progress Notes (Signed)
Initial Nutrition Assessment  DOCUMENTATION CODES:      INTERVENTION:  Boost Breeze po TID, each supplement provides 250 kcal and 9 grams of protein  Follow diet advancement and adequacy of intake  NUTRITION DIAGNOSIS:   Inadequate oral intake related to nausea, vomiting(persisting the past month) as evidenced by energy intake < or equal to 50% for > or equal to 5 days, percent weight loss (noted below), AKI on admission.   GOAL:  Patient will meet greater than or equal to 90% of their needs   MONITOR:  Diet advancement, PO intake, Weight trends, Labs, Supplement acceptance   REASON FOR ASSESSMENT:   Malnutrition Screening Tool    ASSESSMENT: Patient is a pleasant 63 yo female with a history of DM, IBS, HTN,GERD, Gastric erosions, rectal cancer and Parkinson's.   Virtual visit with GI on 10/6-complaining of emesis x 2 weeks.  Patient presents with complaint of nausea and vomiting -dry mouth for the past month now. AKI and hypokalemia on admission. Denies vomiting since admission and nausea is improved per pt with medication change.  Intake of clear liquid meals- she has consumed her broth and [place]. Tolerating clears with no nausea.   Medications reviewed and include: Lipitor, SSI, Protonix  Weight history review shows significant loss of 28% compared to 14 months ago.  NFPE deferred to follow up. Suspect malnutrition given her recent hx of N/V and AKI-severe wt loss.  Labs: BMP Latest Ref Rng & Units 06/25/2019 06/24/2019 10/06/2016  Glucose 70 - 99 mg/dL 79 125(H) 145(H)  BUN 8 - 23 mg/dL 30(H) 53(H) 24(H)  Creatinine 0.44 - 1.00 mg/dL 1.65(H) 2.77(H) 1.12(H)  Sodium 135 - 145 mmol/L 142 139 137  Potassium 3.5 - 5.1 mmol/L 3.0(L) 2.9(L) 3.1(L)  Chloride 98 - 111 mmol/L 109 102 96(L)  CO2 22 - 32 mmol/L 21(L) 19(L) 29  Calcium 8.9 - 10.3 mg/dL 9.2 10.0 10.1    Diet Order:   Diet Order            Diet clear liquid Room service appropriate? Yes; Fluid  consistency: Thin  Diet effective now              EDUCATION NEEDS:  No education needs have been identified at this time Skin:  Skin Assessment: Reviewed RN Assessment  Last BM:  10/25  Height:   Ht Readings from Last 1 Encounters:  06/24/19 5\' 2"  (1.575 m)    Weight:   Wt Readings from Last 1 Encounters:  06/24/19 54.4 kg    Ideal Body Weight:  50 kg  BMI:  Body mass index is 21.95 kg/m.  Estimated Nutritional Needs:   Kcal:  [id number]  Protein:  75-81 (1.4-1.5 gr/kg/bw)  Fluid:  >1600 ml daily   Colman Cater MS,RD,CSG,LDN Office: [phone] Pager: [phone]

## 2019-06-25 NOTE — Progress Notes (Signed)
Patient Demographics:    Linda English, is a 63 y.o. female, DOB - 09/11/1955, [id number]  Admit date - 06/24/2019   Admitting Physician Jani Gravel, MD  Outpatient Primary MD for the patient is Linda Noble, MD  LOS - 0   Chief Complaint  Patient presents with  . Nausea        Subjective:    Linda English today has no fevers, no emesis,  No chest pain, nausea persist but no emesis  Assessment  & Plan :    Principal Problem:   ARF (acute renal failure) (HCC) Active Problems:   Diabetes (HCC)   Nausea & vomiting  Brief summary 63 y.o. female, Bipolar, hypertension, Dm2, Parkinsons disease, h/o rectal cancer, Gerd, Gastric erosions admitted with intractable emesis, AKI and electrolyte derangement  A/p 1)AKI----acute kidney injury due to intractable emesis creatinine on admission=2.77  ,   baseline creatinine =1.1    , creatinine is now= 1.65     , renally adjust medications, avoid nephrotoxic agents/dehydration/hypotension  Continue IV fluids  2)FEN-replace potassium,   3) intractable emesis--- liquid diet as ordered, as needed Zofran -Oral intake limited due to significant nausea  4) Parkinson's disease/generalized weakness-----May need home health at discharge  5)HTN-stable, continue amlodipine  Disposition/Need for in-Hospital Stay- patient unable to be discharged at this time due to --requiring electrolyte replacement, IV fluids, unable to tolerate solid foods for now--oral intake limited due to significant nausea, at risk for further worsening AKI and dehydration if IV fluids are stopped completely at this time  Code Status : Full code  Family Communication:   NA (patient is alert, awake and coherent)  Disposition Plan  : To be determined  Consults  :  Na  DVT Prophylaxis  :  Lovenox -   Lab Results  Component Value Date   PLT 289 06/25/2019    Inpatient  Medications  Scheduled Meds: . amantadine  100 mg Oral BID  . amLODipine  10 mg Oral Daily  . atorvastatin  10 mg Oral q1800  . enoxaparin (LOVENOX) injection  30 mg Subcutaneous Q24H  . feeding supplement  1 Container Oral TID BM  . insulin aspart  0-9 Units Subcutaneous Q4H  . lamoTRIgine  200 mg Oral BID  . levETIRAcetam  1,500 mg Oral QHS  . pantoprazole  40 mg Oral BID   Continuous Infusions: . 0.9 % NaCl with KCl 20 mEq / L 50 mL/hr at 06/25/19 0843   PRN Meds:.clonazePAM    Anti-infectives (From admission, onward)   None        Objective:   Vitals:   06/24/19 2228 06/25/19 0519 06/25/19 0818 06/25/19 1413  BP: (!) 147/84 125/82  118/74  Pulse: (!) 55 84  81  Resp: 16 16  18   Temp: 99.5 F (37.5 C) 98.8 F (37.1 C)  98.2 F (36.8 C)  TempSrc: Oral Oral  Oral  SpO2: (!) 87% 100% 99% 99%  Weight:      Height:        Wt Readings from Last 3 Encounters:  06/24/19 54.4 kg  03/30/18 75.1 kg  12/28/17 72.9 kg     Intake/Output Summary (Last 24 hours) at 06/25/2019 1854 Last data filed at 06/25/2019 1400 Gross per  24 hour  Intake 2184.14 ml  Output 2050 ml  Net 134.14 ml     Physical Exam  Gen:- Awake Alert,  In no apparent distress  HEENT:- [place].AT, No sclera icterus Neck-Supple Neck,No JVD,.  Lungs-  CTAB , fair symmetrical air movement CV- S1, S2 normal, regular  Abd-  +ve B.Sounds, Abd Soft, No tenderness,    Extremity/Skin:- No  edema, pedal pulses present  Psych-affect is appropriate, oriented x3 Neuro-generalized weakness, no new focal deficits, Parkinson's tremors   Data Review:   Micro Results No results found for this or any previous visit (from the past 240 hour(s)).  Radiology Reports Ct Abdomen Pelvis Wo Contrast  Result Date: 06/24/2019 CLINICAL DATA:  Nausea and vomiting with weight loss and abdominal pain, initial encounter EXAM: CT ABDOMEN AND PELVIS WITHOUT CONTRAST TECHNIQUE: Multidetector CT imaging of the abdomen and  pelvis was performed following the standard protocol without IV contrast. COMPARISON:  10/19/2013 FINDINGS: Lower chest: No acute abnormality. Hepatobiliary: No focal liver abnormality is seen. Status post cholecystectomy. No biliary dilatation. Pancreas: Unremarkable. No pancreatic ductal dilatation or surrounding inflammatory changes. Spleen: Normal in size without focal abnormality. Adrenals/Urinary Tract: Adrenal glands are within normal limits. Kidneys are well visualized bilaterally. No renal calculi or obstructive changes are seen. The bladder is partially distended. Stomach/Bowel: Postsurgical changes are noted consistent with lower anterior colonic resection. No obstructive changes are seen. The anastomosis is widely patent. The appendix has been surgically removed. No small bowel or gastric abnormality is seen. Vascular/Lymphatic: Aortic atherosclerosis. No enlarged abdominal or pelvic lymph nodes. Reproductive: Status post hysterectomy. No adnexal masses. Other: No abdominal wall hernia or abnormality. No abdominopelvic ascites. Musculoskeletal: Degenerative changes of lumbar spine are noted. IMPRESSION: No acute abnormality to correspond with patient's given clinical history is noted. Electronically Signed   By: Inez Catalina M.D.   On: 06/24/2019 20:38     CBC Recent Labs  Lab 06/24/19 1700 06/25/19 0547  WBC 5.0 4.8  HGB 11.5* 10.4*  HCT 36.1 33.1*  PLT 358 289  MCV 85.3 86.0  MCH 27.2 27.0  MCHC 31.9 31.4  RDW 13.2 13.2  LYMPHSABS 1.6  --   MONOABS 0.4  --   EOSABS 0.1  --   BASOSABS 0.0  --     Chemistries  Recent Labs  Lab 06/24/19 1700 06/25/19 0547  NA 139 142  K 2.9* 3.0*  CL 102 109  CO2 19* 21*  GLUCOSE 125* 79  BUN 53* 30*  CREATININE 2.77* 1.65*  CALCIUM 10.0 9.2  AST 19 12*  ALT 15 14  ALKPHOS 96 81  BILITOT 0.6 0.7   ------------------------------------------------------------------------------------------------------------------ No results for  input(s): CHOL, HDL, LDLCALC, TRIG, CHOLHDL, LDLDIRECT in the last 72 hours.  Lab Results  Component Value Date   HGBA1C 6.1 (H) 08/10/2015   ------------------------------------------------------------------------------------------------------------------ No results for input(s): TSH, T4TOTAL, T3FREE, THYROIDAB in the last 72 hours.  Invalid input(s): FREET3 ------------------------------------------------------------------------------------------------------------------ No results for input(s): VITAMINB12, FOLATE, FERRITIN, TIBC, IRON, RETICCTPCT in the last 72 hours.  Coagulation profile No results for input(s): INR, PROTIME in the last 168 hours.  No results for input(s): DDIMER in the last 72 hours.  Cardiac Enzymes No results for input(s): CKMB, TROPONINI, MYOGLOBIN in the last 168 hours.  Invalid input(s): CK ------------------------------------------------------------------------------------------------------------------ No results found for: BNP   Roxan Hockey M.D on 06/25/2019 at 6:54 PM  Go to [email or web address] - for contact info  [facility] - Office  [phone]

## 2019-06-25 NOTE — Care Management Obs Status (Signed)
[place] NOTIFICATION   Patient Details  Name: Linda English MRN: [id number] Date of Birth: October 20, 1955   Medicare Observation Status Notification Given:  Yes    Tommy Medal 06/25/2019, 4:06 PM

## 2019-06-26 DIAGNOSIS — I6200 Nontraumatic subdural hemorrhage, unspecified: Secondary | ICD-10-CM | POA: Diagnosis not present

## 2019-06-26 DIAGNOSIS — Z743 Need for continuous supervision: Secondary | ICD-10-CM | POA: Diagnosis not present

## 2019-06-26 DIAGNOSIS — I959 Hypotension, unspecified: Secondary | ICD-10-CM | POA: Diagnosis not present

## 2019-06-26 DIAGNOSIS — Z20828 Contact with and (suspected) exposure to other viral communicable diseases: Secondary | ICD-10-CM | POA: Diagnosis present

## 2019-06-26 DIAGNOSIS — F319 Bipolar disorder, unspecified: Secondary | ICD-10-CM | POA: Diagnosis present

## 2019-06-26 DIAGNOSIS — I69128 Other speech and language deficits following nontraumatic intracerebral hemorrhage: Secondary | ICD-10-CM | POA: Diagnosis not present

## 2019-06-26 DIAGNOSIS — Y92009 Unspecified place in unspecified non-institutional (private) residence as the place of occurrence of the external cause: Secondary | ICD-10-CM | POA: Diagnosis not present

## 2019-06-26 DIAGNOSIS — Z9049 Acquired absence of other specified parts of digestive tract: Secondary | ICD-10-CM | POA: Diagnosis not present

## 2019-06-26 DIAGNOSIS — R634 Abnormal weight loss: Secondary | ICD-10-CM | POA: Diagnosis present

## 2019-06-26 DIAGNOSIS — R110 Nausea: Secondary | ICD-10-CM | POA: Diagnosis present

## 2019-06-26 DIAGNOSIS — Z90710 Acquired absence of both cervix and uterus: Secondary | ICD-10-CM | POA: Diagnosis not present

## 2019-06-26 DIAGNOSIS — Z8719 Personal history of other diseases of the digestive system: Secondary | ICD-10-CM | POA: Diagnosis not present

## 2019-06-26 DIAGNOSIS — G20 Parkinson's disease: Secondary | ICD-10-CM | POA: Diagnosis not present

## 2019-06-26 DIAGNOSIS — R2689 Other abnormalities of gait and mobility: Secondary | ICD-10-CM | POA: Diagnosis not present

## 2019-06-26 DIAGNOSIS — F410 Panic disorder [episodic paroxysmal anxiety] without agoraphobia: Secondary | ICD-10-CM | POA: Diagnosis present

## 2019-06-26 DIAGNOSIS — R000 Tachycardia, unspecified: Secondary | ICD-10-CM | POA: Diagnosis not present

## 2019-06-26 DIAGNOSIS — Z8711 Personal history of peptic ulcer disease: Secondary | ICD-10-CM | POA: Diagnosis not present

## 2019-06-26 DIAGNOSIS — Z7401 Bed confinement status: Secondary | ICD-10-CM | POA: Diagnosis not present

## 2019-06-26 DIAGNOSIS — E878 Other disorders of electrolyte and fluid balance, not elsewhere classified: Secondary | ICD-10-CM | POA: Diagnosis not present

## 2019-06-26 DIAGNOSIS — K219 Gastro-esophageal reflux disease without esophagitis: Secondary | ICD-10-CM | POA: Diagnosis present

## 2019-06-26 DIAGNOSIS — S065X9A Traumatic subdural hemorrhage with loss of consciousness of unspecified duration, initial encounter: Secondary | ICD-10-CM | POA: Diagnosis present

## 2019-06-26 DIAGNOSIS — I10 Essential (primary) hypertension: Secondary | ICD-10-CM | POA: Diagnosis not present

## 2019-06-26 DIAGNOSIS — E860 Dehydration: Secondary | ICD-10-CM | POA: Diagnosis not present

## 2019-06-26 DIAGNOSIS — M158 Other polyosteoarthritis: Secondary | ICD-10-CM | POA: Diagnosis not present

## 2019-06-26 DIAGNOSIS — F29 Unspecified psychosis not due to a substance or known physiological condition: Secondary | ICD-10-CM | POA: Diagnosis not present

## 2019-06-26 DIAGNOSIS — R404 Transient alteration of awareness: Secondary | ICD-10-CM | POA: Diagnosis not present

## 2019-06-26 DIAGNOSIS — E876 Hypokalemia: Secondary | ICD-10-CM | POA: Diagnosis present

## 2019-06-26 DIAGNOSIS — F411 Generalized anxiety disorder: Secondary | ICD-10-CM | POA: Diagnosis present

## 2019-06-26 DIAGNOSIS — W19XXXA Unspecified fall, initial encounter: Secondary | ICD-10-CM | POA: Diagnosis not present

## 2019-06-26 DIAGNOSIS — N179 Acute kidney failure, unspecified: Secondary | ICD-10-CM | POA: Diagnosis not present

## 2019-06-26 DIAGNOSIS — Z23 Encounter for immunization: Secondary | ICD-10-CM | POA: Diagnosis not present

## 2019-06-26 DIAGNOSIS — S065X0D Traumatic subdural hemorrhage without loss of consciousness, subsequent encounter: Secondary | ICD-10-CM | POA: Diagnosis not present

## 2019-06-26 DIAGNOSIS — Z85048 Personal history of other malignant neoplasm of rectum, rectosigmoid junction, and anus: Secondary | ICD-10-CM | POA: Diagnosis not present

## 2019-06-26 DIAGNOSIS — R112 Nausea with vomiting, unspecified: Secondary | ICD-10-CM | POA: Diagnosis not present

## 2019-06-26 DIAGNOSIS — Z8371 Family history of colonic polyps: Secondary | ICD-10-CM | POA: Diagnosis not present

## 2019-06-26 DIAGNOSIS — E119 Type 2 diabetes mellitus without complications: Secondary | ICD-10-CM | POA: Diagnosis not present

## 2019-06-26 DIAGNOSIS — Z800 Family history of malignant neoplasm of digestive organs: Secondary | ICD-10-CM | POA: Diagnosis not present

## 2019-06-26 DIAGNOSIS — G4733 Obstructive sleep apnea (adult) (pediatric): Secondary | ICD-10-CM | POA: Diagnosis present

## 2019-06-26 DIAGNOSIS — M6281 Muscle weakness (generalized): Secondary | ICD-10-CM | POA: Diagnosis not present

## 2019-06-26 LAB — GLUCOSE, CAPILLARY
Glucose-Capillary: 104 mg/dL — ABNORMAL HIGH (ref 70–99)
Glucose-Capillary: 108 mg/dL — ABNORMAL HIGH (ref 70–99)
Glucose-Capillary: 114 mg/dL — ABNORMAL HIGH (ref 70–99)
Glucose-Capillary: 114 mg/dL — ABNORMAL HIGH (ref 70–99)
Glucose-Capillary: 137 mg/dL — ABNORMAL HIGH (ref 70–99)
Glucose-Capillary: 88 mg/dL (ref 70–99)
Glucose-Capillary: 97 mg/dL (ref 70–99)

## 2019-06-26 LAB — BASIC METABOLIC PANEL
Anion gap: 6 (ref 5–15)
BUN: 10 mg/dL (ref 8–23)
CO2: 22 mmol/L (ref 22–32)
Calcium: 9.3 mg/dL (ref 8.9–10.3)
Chloride: 112 mmol/L — ABNORMAL HIGH (ref 98–111)
Creatinine, Ser: 1.12 mg/dL — ABNORMAL HIGH (ref 0.44–1.00)
GFR calc Af Amer: >60 mL/min (ref >60)
GFR calc non Af Amer: 53 mL/min — ABNORMAL LOW (ref >60)
Glucose, Bld: 114 mg/dL — ABNORMAL HIGH (ref 70–99)
Potassium: 4.1 mmol/L (ref 3.5–5.1)
Sodium: 140 mmol/L (ref 135–145)

## 2019-06-26 NOTE — TOC Initial Note (Signed)
Transition of Care [facility]) - Initial/Assessment Note    Patient Details  Name: Linda English MRN: [id number] Date of Birth: [birth year]  Transition of [facility]) CM/SW Contact:    Ihor Gully, LCSW Phone Number: 06/26/2019, 1:27 PM  Clinical Narrative:                 Patient from home with adult and dependent daughters, ages 57 and 46. She has a walker in the home but does not use it because she says it's too heavy. Her oldest daughter assists her with ADLs when needed. Patient states that she has experienced falls recently and is usually able to get up or her daughters assist her in getting up. PT evaluation discussed and patient is unsure. She request that TOC speak with her daughter, Linda English. Patient and daughter discussed PT evaluation and discharge. Patient is agreeable to short term rehab. Choices provided form Medicare website. Referrals sent to choices.   Expected Discharge Plan: Skilled Nursing Facility Barriers to Discharge: Continued Medical Work up   Patient Goals and CMS Choice Patient states their goals for this hospitalization and ongoing recovery are:: Patient is unsure as to whether she desires SNF or [facility] CMS Medicare.gov Compare Post Acute Care list provided to:: Patient Choice offered to / list presented to : Patient, Adult Children  Expected Discharge Plan and Services Expected Discharge Plan: [place]       Living arrangements for the past 2 months: Apartment                                      Prior Living Arrangements/Services Living arrangements for the past 2 months: Apartment Lives with:: Adult Children, Minor Children Patient language and need for interpreter reviewed:: Yes Do you feel safe going back to the place where you live?: Yes      Need for Family Participation in Patient Care: Yes (Comment) Care giver support system in place?: Yes (comment) Current home services: DME Criminal Activity/Legal Involvement  Pertinent to Current Situation/Hospitalization: No - Comment as needed  Activities of Daily Living Home Assistive Devices/Equipment: Walker (specify type), CBG Meter, Cane (specify quad or straight) ADL Screening (condition at time of admission) Patient's cognitive ability adequate to safely complete daily activities?: Yes Is the patient deaf or have difficulty hearing?: No Does the patient have difficulty seeing, even when wearing glasses/contacts?: Yes Does the patient have difficulty concentrating, remembering, or making decisions?: No Patient able to express need for assistance with ADLs?: Yes Does the patient have difficulty dressing or bathing?: Yes Independently performs ADLs?: No Communication: Independent Dressing (OT): Needs assistance Is this a change from baseline?: Pre-admission baseline Grooming: Needs assistance Is this a change from baseline?: Pre-admission baseline Feeding: Independent Bathing: Needs assistance Is this a change from baseline?: Pre-admission baseline Toileting: Needs assistance Is this a change from baseline?: Pre-admission baseline In/Out Bed: Needs assistance Is this a change from baseline?: Pre-admission baseline Walks in Home: Needs assistance Is this a change from baseline?: Pre-admission baseline Does the patient have difficulty walking or climbing stairs?: Yes Weakness of Legs: Both Weakness of Arms/Hands: Both  Permission Sought/Granted            Permission granted to share info w Relationship: Daughter, Education officer, community     Emotional Assessment Appearance:: Appears stated age   Affect (typically observed): Appropriate Orientation: : Oriented to Self, Oriented to Place, Oriented to  Time,  Oriented to Situation Alcohol / Substance Use: Not Applicable Psych Involvement: No (comment)  Admission diagnosis:  Dehydration [E86.0] Hypokalemia [E87.6] Acute kidney injury ([place]) [N17.9] Non-intractable vomiting with nausea, unspecified vomiting  type [R11.2] ARF (acute renal failure) ([place]) [N17.9] Patient Active Problem List   Diagnosis Date Noted  . AKI (acute kidney injury) ([place]) 06/26/2019  . ARF (acute renal failure) ([place]) 06/24/2019  . Nausea & vomiting 06/05/2019  . Diarrhea 07/27/2017  . Transient ischemic attack 08/09/2015  . Reflux esophagitis   . Gastric polyp   . Dyspepsia 06/13/2015  . Dysphagia 06/13/2015  . Incisional irritation 06/18/2013  . Wound drainage 06/18/2013  . Bipolar 1 disorder ([place]) 06/08/2012    Class: Chronic  . Anorexia 05/23/2012  . Insomnia 05/23/2012  . Weight loss 05/23/2012  . Weight loss, abnormal 04/10/2012  . Esophageal dysphagia 04/10/2012  . Nausea 09/08/2011  . GERD (gastroesophageal reflux disease) 07/14/2011  . Epigastric pain 07/14/2011  . Constipation 07/14/2011  . Knee pain 02/09/2011  . OA (osteoarthritis) of knee 02/09/2011  . Family hx of colon cancer 11/30/2010  . Bowel habit changes 11/30/2010  . OSTEOARTHRITIS, KNEE 08/05/2010  . PES PLANUS 08/05/2010  . RECTAL CANCER 10/23/2007  . Diabetes ([place]) 10/23/2007  . HYPERLIPIDEMIA 10/23/2007  . ANXIETY 10/23/2007  . PANIC DISORDER 10/23/2007  . DEPRESSION 10/23/2007  . OBSTRUCTIVE SLEEP APNEA 10/23/2007  . Essential hypertension 10/23/2007  . ALLERGIC RHINITIS 10/23/2007  . IBS 10/23/2007  . HEADACHE, CHRONIC 10/23/2007   PCP:  [name], MD Pharmacy:   [facility] [place] [address] Phone: [phone] Fax: [phone]  [facility] [place] [address] [place] [address] Phone: [phone] Fax: [phone]     Social Determinants of Health ([place]) Interventions    Readmission Risk Interventions No flowsheet data found.

## 2019-06-26 NOTE — Progress Notes (Signed)
Patient Demographics:    Linda English, is a 63 y.o. female, DOB - [birth year], [id number]  Admit date - 06/24/2019   Admitting Physician Jani Gravel, MD  Outpatient Primary MD for the patient is [name], MD  LOS - 0   Chief Complaint  Patient presents with  . Nausea        Subjective:    Linda English today has no fevers,  No chest pain, c/o weakness, tolerating full liquids No BM Has nausea, no further emesis  Assessment  & Plan :    Principal Problem:   ARF (acute renal failure) (HCC) Active Problems:   Diabetes (HCC)   Nausea & vomiting  Brief summary 63 y.o. female, Bipolar, hypertension, Dm2, Parkinsons disease, h/o rectal cancer, Gerd, Gastric erosions admitted with intractable emesis, AKI and electrolyte derangement  A/p 1)AKI----acute kidney injury due to intractable emesis creatinine on admission=2.77  ,   baseline creatinine =1.1    , creatinine is now= 1.1     , renally adjust medications, avoid nephrotoxic agents/dehydration/hypotension - AKI resolved with IV fluids  2) intractable emesis /FEN-hypokalemia resolved with replacement r -Tolerating full liquid diet okay, will advance diet -Nausea persist no further emesis at this time  3) generalized weakness and deconditioning --very unsteady gait, high fall risk, physical therapy eval appreciated, -Recommend SNF rehab  4) Parkinson's disease- tremors and gait problems have worsened most likely due to subtherapeutic levels of medications in the setting of recurrent emesis PTA ------ continue current meds  5)HTN-stable, continue amlodipine  Disposition/Need for in-Hospital Stay- patient unable to be discharged at this time due to --- unsteady gait, high fall risk, unsafe discharge plan to home, awaiting placement to SNF rehab  Code Status : Full code  Family Communication:   (patient is alert, awake and  coherent) -Left Voicemail for Daughter- Ms Maripat [id number]  Disposition Plan  : To be determined  Consults  :  Na  DVT Prophylaxis  :  Lovenox -   Lab Results  Component Value Date   PLT 289 06/25/2019    Inpatient Medications  Scheduled Meds: . amantadine  100 mg Oral BID  . amLODipine  10 mg Oral Daily  . atorvastatin  10 mg Oral q1800  . enoxaparin (LOVENOX) injection  30 mg Subcutaneous Q24H  . feeding supplement  1 Container Oral TID BM  . insulin aspart  0-9 Units Subcutaneous Q4H  . lamoTRIgine  200 mg Oral BID  . levETIRAcetam  1,500 mg Oral QHS  . pantoprazole  40 mg Oral BID   Continuous Infusions: . 0.9 % NaCl with KCl 20 mEq / L 50 mL/hr at 06/26/19 0333   PRN Meds:.clonazePAM    Anti-infectives (From admission, onward)   None        Objective:   Vitals:   06/25/19 2122 06/26/19 0457 06/26/19 0644 06/26/19 0743  BP:  131/73    Pulse:  95    Resp:  16    Temp:  98.3 F (36.8 C)    TempSrc:  Oral    SpO2: 93% 95%  94%  Weight:   56.9 kg   Height:        Wt Readings from Last 3 Encounters:  06/26/19 56.9 kg  03/30/18 75.1 kg  12/28/17 72.9 kg     Intake/Output Summary (Last 24 hours) at 06/26/2019 1220 Last data filed at 06/26/2019 0605 Gross per 24 hour  Intake 1679.74 ml  Output 3000 ml  Net -1320.26 ml    Physical Exam Gen:- Awake Alert,  In no apparent distress  HEENT:- [place].AT, No sclera icterus Neck-Supple Neck,No JVD,.  Lungs-  CTAB , fair symmetrical air movement CV- S1, S2 normal, regular  Abd-  +ve B.Sounds, Abd Soft, No tenderness,    Extremity/Skin:- No  edema, pedal pulses present  Psych-affect is appropriate, oriented x3 Neuro-generalized weakness, no new focal deficits, worsening Parkinson's tremors   Data Review:   Micro Results Recent Results (from the past 240 hour(s))  SARS CORONAVIRUS 2 (TAT 6-24 HRS) Nasopharyngeal Nasopharyngeal Swab     Status: None   Collection Time: 06/24/19  8:51 PM    Specimen: Nasopharyngeal Swab  Result Value Ref Range Status   SARS Coronavirus 2 NEGATIVE NEGATIVE Final    Comment: (NOTE) SARS-CoV-2 target nucleic acids are NOT DETECTED. The SARS-CoV-2 RNA is generally detectable in upper and lower respiratory specimens during the acute phase of infection. Negative results do not preclude SARS-CoV-2 infection, do not rule out co-infections with other pathogens, and should not be used as the sole basis for treatment or other patient management decisions. Negative results must be combined with clinical observations, patient history, and epidemiological information. The expected result is Negative. Fact Sheet for Patients: SugarRoll.be Fact Sheet for Healthcare Providers: https://www.woods-mathews.com/ This test is not yet approved or cleared by the [place] FDA and  has been authorized for detection and/or diagnosis of SARS-CoV-2 by FDA under an Emergency Use Authorization (EUA). This EUA will remain  in effect (meaning this test can be used) for the duration of the COVID-19 declaration under Section 56 4(b)(1) of the Act, 21 U.S.C. section 360bbb-3(b)(1), unless the authorization is terminated or revoked sooner. Performed at [facility], [place] [address] [place],  [address]     Radiology Reports Ct Abdomen Pelvis Wo Contrast  Result Date: 06/24/2019 CLINICAL DATA:  Nausea and vomiting with weight loss and abdominal pain, initial encounter EXAM: CT ABDOMEN AND PELVIS WITHOUT CONTRAST TECHNIQUE: Multidetector CT imaging of the abdomen and pelvis was performed following the standard protocol without IV contrast. COMPARISON:  10/19/2013 FINDINGS: Lower chest: No acute abnormality. Hepatobiliary: No focal liver abnormality is seen. Status post cholecystectomy. No biliary dilatation. Pancreas: Unremarkable. No pancreatic ductal dilatation or surrounding inflammatory changes. Spleen: Normal in  size without focal abnormality. Adrenals/Urinary Tract: Adrenal glands are within normal limits. Kidneys are well visualized bilaterally. No renal calculi or obstructive changes are seen. The bladder is partially distended. Stomach/Bowel: Postsurgical changes are noted consistent with lower anterior colonic resection. No obstructive changes are seen. The anastomosis is widely patent. The appendix has been surgically removed. No small bowel or gastric abnormality is seen. Vascular/Lymphatic: Aortic atherosclerosis. No enlarged abdominal or pelvic lymph nodes. Reproductive: Status post hysterectomy. No adnexal masses. Other: No abdominal wall hernia or abnormality. No abdominopelvic ascites. Musculoskeletal: Degenerative changes of lumbar spine are noted. IMPRESSION: No acute abnormality to correspond with patient's given clinical history is noted. Electronically Signed   By: Inez Catalina M.D.   On: 06/24/2019 20:38    CBC Recent Labs  Lab 06/24/19 1700 06/25/19 0547  WBC 5.0 4.8  HGB 11.5* 10.4*  HCT 36.1 33.1*  PLT 358 289  MCV 85.3 86.0  MCH 27.2 27.0  MCHC 31.9  31.4  RDW 13.2 13.2  LYMPHSABS 1.6  --   MONOABS 0.4  --   EOSABS 0.1  --   BASOSABS 0.0  --     Chemistries  Recent Labs  Lab 06/24/19 1700 06/25/19 0547 06/26/19 0610  NA 139 142 140  K 2.9* 3.0* 4.1  CL 102 109 112*  CO2 19* 21* 22  GLUCOSE 125* 79 114*  BUN 53* 30* 10  CREATININE 2.77* 1.65* 1.12*  CALCIUM 10.0 9.2 9.3  AST 19 12*  --   ALT 15 14  --   ALKPHOS 96 81  --   BILITOT 0.6 0.7  --    ------------------------------------------------------------------------------------------------------------------ No results for input(s): CHOL, HDL, LDLCALC, TRIG, CHOLHDL, LDLDIRECT in the last 72 hours.  Lab Results  Component Value Date   HGBA1C 6.1 (H) 08/10/2015   ------------------------------------------------------------------------------------------------------------------ No results for input(s): TSH,  T4TOTAL, T3FREE, THYROIDAB in the last 72 hours.  Invalid input(s): FREET3 ------------------------------------------------------------------------------------------------------------------ No results for input(s): VITAMINB12, FOLATE, FERRITIN, TIBC, IRON, RETICCTPCT in the last 72 hours.  Coagulation profile No results for input(s): INR, PROTIME in the last 168 hours.  No results for input(s): DDIMER in the last 72 hours.  Cardiac Enzymes No results for input(s): CKMB, TROPONINI, MYOGLOBIN in the last 168 hours.  Invalid input(s): CK ------------------------------------------------------------------------------------------------------------------ No results found for: BNP   Roxan Hockey M.D on 06/26/2019 at 12:20 PM  Go to [email or web address] - for contact info  [facility] - Office  [phone]

## 2019-06-26 NOTE — Plan of Care (Signed)
  Problem: Acute Rehab PT Goals(only PT should resolve) Goal: Pt Will Go Supine/Side To Sit Outcome: Progressing Flowsheets (Taken 06/26/2019 1239) Pt will go Supine/Side to Sit:  with min guard assist  with minimal assist Goal: Patient Will Transfer Sit To/From Stand Outcome: Progressing Flowsheets (Taken 06/26/2019 1239) Patient will transfer sit to/from stand: with minimal assist Goal: Pt Will Transfer Bed To Chair/Chair To Bed Outcome: Progressing Flowsheets (Taken 06/26/2019 1239) Pt will Transfer Bed to Chair/Chair to Bed: with min assist Goal: Pt Will Ambulate Outcome: Progressing Flowsheets (Taken 06/26/2019 1239) Pt will Ambulate:  25 feet  with minimal assist  with rolling walker   12:40 PM, 06/26/19 Lonell Grandchild, MPT Physical Therapist with [facility] [phone] office [phone] mobile phone

## 2019-06-26 NOTE — NC FL2 (Signed)
[place] MEDICAID FL2 LEVEL OF CARE SCREENING TOOL     IDENTIFICATION  Patient Name: Linda English Birthdate: [birth year] Sex: female Admission Date (Current Location): 06/24/2019  [facility] and [place] Number:  [employer] and Address:  [facility] [address] [place]      Provider Number: [id number]  Attending Physician Name and Address:  Roxan Hockey, MD  Relative Name and Phone Number:  Yasmin Sharaf, daughter, [phone]    Current Level of Care: Hospital Recommended Level of Care: [place] Prior Approval Number:    Date Approved/Denied:   PASRR Number:    Discharge Plan: SNF    Current Diagnoses: Patient Active Problem List   Diagnosis Date Noted  . AKI (acute kidney injury) ([place]) 06/26/2019  . ARF (acute renal failure) ([place]) 06/24/2019  . Nausea & vomiting 06/05/2019  . Diarrhea 07/27/2017  . Transient ischemic attack 08/09/2015  . Reflux esophagitis   . Gastric polyp   . Dyspepsia 06/13/2015  . Dysphagia 06/13/2015  . Incisional irritation 06/18/2013  . Wound drainage 06/18/2013  . Bipolar 1 disorder ([place]) 06/08/2012    Class: Chronic  . Anorexia 05/23/2012  . Insomnia 05/23/2012  . Weight loss 05/23/2012  . Weight loss, abnormal 04/10/2012  . Esophageal dysphagia 04/10/2012  . Nausea 09/08/2011  . GERD (gastroesophageal reflux disease) 07/14/2011  . Epigastric pain 07/14/2011  . Constipation 07/14/2011  . Knee pain 02/09/2011  . OA (osteoarthritis) of knee 02/09/2011  . Family hx of colon cancer 11/30/2010  . Bowel habit changes 11/30/2010  . OSTEOARTHRITIS, KNEE 08/05/2010  . PES PLANUS 08/05/2010  . RECTAL CANCER 10/23/2007  . Diabetes ([place]) 10/23/2007  . HYPERLIPIDEMIA 10/23/2007  . ANXIETY 10/23/2007  . PANIC DISORDER 10/23/2007  . DEPRESSION 10/23/2007  . OBSTRUCTIVE SLEEP APNEA 10/23/2007  . Essential hypertension 10/23/2007  . ALLERGIC RHINITIS 10/23/2007   . IBS 10/23/2007  . HEADACHE, CHRONIC 10/23/2007    Orientation RESPIRATION BLADDER Height & Weight     Time, Situation, Place, Self  Normal Continent Weight: 125 lb 7.1 oz (56.9 kg) Height:  5\' 2"  (157.5 cm)  BEHAVIORAL SYMPTOMS/MOOD NEUROLOGICAL BOWEL NUTRITION STATUS      Continent Diet(see discharge summary)  AMBULATORY STATUS COMMUNICATION OF NEEDS Skin   Extensive Assist Verbally Normal                       Personal Care Assistance Level of Assistance  Bathing, Feeding, Dressing Bathing Assistance: Limited assistance Feeding assistance: Independent Dressing Assistance: Limited assistance     Functional Limitations Info  Sight, Hearing, Speech Sight Info: Adequate Hearing Info: Adequate Speech Info: Adequate    SPECIAL CARE FACTORS FREQUENCY  PT (By licensed PT)     PT Frequency: 5x/week              Contractures Contractures Info: Not present    Additional Factors Info  Code Status, Allergies, Psychotropic Code Status Info: Full Code Allergies Info: Shellfish Allergy, Lithium, Excedrin Extra Strength Psychotropic Info: Klonopin, Lamictal         Current Medications (06/26/2019):  This is the current hospital active medication list Current Facility-Administered Medications  Medication Dose Route Frequency Provider Last Rate Last Dose  . 0.9 % NaCl with KCl 20 mEq/ L  infusion   Intravenous Continuous Roxan Hockey, MD 50 mL/hr at 06/26/19 0333    . amantadine (SYMMETREL) capsule 100 mg  100 mg Oral BID Jani Gravel, MD   100  mg at 06/26/19 0821  . amLODipine (NORVASC) tablet 10 mg  10 mg Oral Daily Jani Gravel, MD   10 mg at 06/26/19 [id number]  . atorvastatin (LIPITOR) tablet 10 mg  10 mg Oral q1800 Jani Gravel, MD   10 mg at 06/25/19 1654  . clonazePAM (KLONOPIN) tablet 1 mg  1 mg Oral BID PRN Jani Gravel, MD      . enoxaparin (LOVENOX) injection 30 mg  30 mg Subcutaneous Q24H Jani Gravel, MD   30 mg at 06/26/19 0827  . feeding supplement (BOOST /  RESOURCE BREEZE) liquid 1 Container  1 Container Oral TID BM Jani Gravel, MD   1 Container at 06/26/19 1442  . insulin aspart (novoLOG) injection 0-9 Units  0-9 Units Subcutaneous Q4H Jani Gravel, MD   1 Units at 06/26/19 1217  . lamoTRIgine (LAMICTAL) tablet 200 mg  200 mg Oral BID Jani Gravel, MD   200 mg at 06/26/19 [id number]  . levETIRAcetam (KEPPRA) tablet 1,500 mg  1,500 mg Oral QHS Jani Gravel, MD   1,500 mg at 06/25/19 2123  . pantoprazole (PROTONIX) EC tablet 40 mg  40 mg Oral BID Jani Gravel, MD   40 mg at 06/26/19 0825     Discharge Medications: Please see discharge summary for a list of discharge medications.  Relevant Imaging Results:  Relevant Lab Results:   Additional Information SSN [id number], Clydene Pugh, LCSW

## 2019-06-26 NOTE — Evaluation (Signed)
Physical Therapy Evaluation Patient Details Name: Linda English MRN: [id number] DOB: [birth year] Today's Date: 06/26/2019   History of Present Illness  Linda English  is a 63 y.o. female, Bipolar, hypertension, Dm2, Parkinsons disease, h/o rectal cancer, Gerd, Gastric erosions, presents with intractable n/v,  For the past 2 months .  At least 2 x per day.  Pt notes taking ibuprofen at times.  Pt noted diarrhea about 3 days ago.  Pt denies fever, chills, abd pain, brbpr,    Clinical Impression  Patient very shaky and unable to ambulate away from bedside due to fall risk, required Max assist to take a few steps at bedside to transfer to chair and tolerated sitting up after therapy - RN notified.  Patient will benefit from continued physical therapy in hospital and recommended venue below to increase strength, balance, endurance for safe ADLs and gait.    Follow Up Recommendations SNF;Supervision for mobility/OOB;Supervision/Assistance - 24 hour    Equipment Recommendations  None recommended by PT    Recommendations for Other Services       Precautions / Restrictions Precautions Precautions: Fall Restrictions Weight Bearing Restrictions: No      Mobility  Bed Mobility Overal bed mobility: Needs Assistance Bed Mobility: Supine to Sit     Supine to sit: Min assist;Mod assist     General bed mobility comments: slow labored movement  Transfers Overall transfer level: Needs assistance Equipment used: Rolling walker (2 wheeled) Transfers: Sit to/from [facility] Sit to Stand: Mod assist Stand pivot transfers: Mod assist;Max assist       General transfer comment: labored movement, unsteady due to severe tremors  Ambulation/Gait Ambulation/Gait assistance: Max assist Gait Distance (Feet): 3 Feet Assistive device: Rolling walker (2 wheeled) Gait Pattern/deviations: Decreased step length - right;Decreased step length - left;Decreased stride  length Gait velocity: slow   General Gait Details: limited to 3-4 slow unsteady steps due to severe tremors resulting in poor standing balance  Stairs            Wheelchair Mobility    Modified Rankin (Stroke Patients Only)       Balance Overall balance assessment: Needs assistance Sitting-balance support: Feet supported;No upper extremity supported Sitting balance-Leahy Scale: Fair Sitting balance - Comments: seated at EOB   Standing balance support: During functional activity;Bilateral upper extremity supported Standing balance-Leahy Scale: Poor Standing balance comment: using RW                             Pertinent Vitals/Pain Pain Assessment: No/denies pain    Home Living Family/patient expects to be discharged to:: Private residence Living Arrangements: Children Available Help at Discharge: Family;Available PRN/intermittently Type of Home: Apartment Home Access: Stairs to enter;Level entry Entrance Stairs-Rails: None Entrance Stairs-Number of Steps: 3 steps, patient states she has level entry, but have walk up a hill to go through that entrance Home Layout: One level Home Equipment: Walker - 2 wheels;Cane - single point;Shower seat      Prior Function Level of Independence: Needs assistance   Gait / Transfers Assistance Needed: supervised household ambulation with SPC or RW  ADL's / Homemaking Assistance Needed: assisted by older daughter        Hand Dominance        Extremity/Trunk Assessment   Upper Extremity Assessment Upper Extremity Assessment: Generalized weakness    Lower Extremity Assessment Lower Extremity Assessment: Generalized weakness    Cervical / Trunk Assessment Cervical / Trunk  Assessment: Normal;Other exceptions Cervical / Trunk Exceptions: severe tremors  Communication   Communication: No difficulties  Cognition Arousal/Alertness: Awake/alert Behavior During Therapy: WFL for tasks  assessed/performed Overall Cognitive Status: Within Functional Limits for tasks assessed                                        General Comments      Exercises     Assessment/Plan    PT Assessment Patient needs continued PT services  PT Problem List Decreased strength;Decreased activity tolerance;Decreased balance;Decreased mobility       PT Treatment Interventions Gait training;Functional mobility training;Therapeutic activities;Stair training;Therapeutic exercise;Patient/family education    PT Goals (Current goals can be found in the Care Plan section)  Acute Rehab PT Goals Patient Stated Goal: return home with family to assist PT Goal Formulation: With patient Time For Goal Achievement: 07/10/19 Potential to Achieve Goals: Good    Frequency Min 3X/week   Barriers to discharge        Co-evaluation               AM-PAC PT "6 Clicks" Mobility  Outcome Measure Help needed turning from your back to your side while in a flat bed without using bedrails?: A Little Help needed moving from lying on your back to sitting on the side of a flat bed without using bedrails?: A Lot Help needed moving to and from a bed to a chair (including a wheelchair)?: A Lot Help needed standing up from a chair using your arms (e.g., wheelchair or bedside chair)?: A Lot Help needed to walk in hospital room?: Total Help needed climbing 3-5 steps with a railing? : Total 6 Click Score: 11    End of Session Equipment Utilized During Treatment: Gait belt Activity Tolerance: Patient limited by fatigue;Patient tolerated treatment well Patient left: in chair;with call bell/phone within reach;with chair alarm set Nurse Communication: Mobility status PT Visit Diagnosis: Unsteadiness on feet (R26.81);Other abnormalities of gait and mobility (R26.89);Muscle weakness (generalized) (M62.81)    Time: [id number] PT Time Calculation (min) (ACUTE ONLY): 26 min   Charges:   PT  Evaluation $PT Eval Moderate Complexity: 1 Mod PT Treatments $Therapeutic Activity: 23-37 mins        12:34 PM, 06/26/19 Lonell Grandchild, MPT Physical Therapist with [facility] [phone] office [phone] mobile phone

## 2019-06-26 NOTE — Progress Notes (Signed)
Patient is having hallucinations at this time. Convinced family is in the television. Notified midlevel

## 2019-06-27 LAB — GLUCOSE, CAPILLARY
Glucose-Capillary: 107 mg/dL — ABNORMAL HIGH (ref 70–99)
Glucose-Capillary: 74 mg/dL (ref 70–99)
Glucose-Capillary: 77 mg/dL (ref 70–99)
Glucose-Capillary: 72 mg/dL (ref 70–99)
Glucose-Capillary: 99 mg/dL (ref 70–99)

## 2019-06-27 NOTE — Progress Notes (Signed)
Patietnt's HR sustaining sustaining in the 140s and she's having halucination and psychosis right now but no acute distress.  Administered Klonapin 1mg  oral as neededd for anxiety. Notified Dr. Maudie Mercury to see if he would order any other drug in case the [facility] doesn't work. He stated her HR has been elevated the whole day and went ahead and ordered D dimer, Troponin and ekg. Will admionster and continue to monitor,

## 2019-06-27 NOTE — Progress Notes (Signed)
Patient Demographics:    Linda English, is a 63 y.o. female, DOB - 10-09-1955, [id number]  Admit date - 06/24/2019   Admitting Physician Jani Gravel, MD  Outpatient Primary MD for the patient is [name], MD  LOS - 1   Chief Complaint  Patient presents with  . Nausea        Subjective:    Linda English today has no fevers,  No chest pain, c/o weakness,  -No further emesis, nausea improved significantly, patient requesting that we advance her diet  -Had episodes of confusion/psychosis overnight -Back to baseline now  Assessment  & Plan :    Principal Problem:   ARF (acute renal failure) (HCC) Active Problems:   Diabetes ([place])   Nausea & vomiting   AKI (acute kidney injury) ([place])  Brief summary 63 y.o. female, Bipolar, hypertension, Dm2, Parkinsons disease, h/o rectal cancer, Gerd, Gastric erosions admitted with intractable emesis, AKI and electrolyte derangement  A/p 1)AKI----acute kidney injury due to intractable emesis creatinine on admission=2.77  ,   baseline creatinine =1.1    , creatinine is now= 1.1     , renally adjust medications, avoid nephrotoxic agents/dehydration/hypotension - AKI resolved with IV fluids  2) intractable emesis /FEN-hypokalemia resolved with replacement  --No further emesis, change diet to carb modified  3) generalized weakness and deconditioning --very unsteady gait, high fall risk, physical therapy eval appreciated, -Recommend SNF rehab  4) Parkinson's disease- tremors and gait problems have worsened most likely due to subtherapeutic levels of medications in the setting of recurrent emesis PTA ------ continue current meds  5)HTN-stable, continue amlodipine  Disposition/Need for in-Hospital Stay- patient unable to be discharged at this time due to --- unsteady gait, high fall risk, unsafe discharge plan to home, awaiting placement to SNF  rehab  Code Status : Full code  Family Communication:   (patient is alert, awake and coherent) -Left Voicemail for Daughter- Ms Yanaira [id number]  Disposition Plan  : Awaiting transfer to SNF rehab Consults  :  Na  DVT Prophylaxis  :  Lovenox -   Lab Results  Component Value Date   PLT 289 06/25/2019    Inpatient Medications  Scheduled Meds: . amantadine  100 mg Oral BID  . amLODipine  10 mg Oral Daily  . atorvastatin  10 mg Oral q1800  . enoxaparin (LOVENOX) injection  30 mg Subcutaneous Q24H  . feeding supplement  1 Container Oral TID BM  . insulin aspart  0-9 Units Subcutaneous Q4H  . lamoTRIgine  200 mg Oral BID  . levETIRAcetam  1,500 mg Oral QHS  . pantoprazole  40 mg Oral BID   Continuous Infusions: . 0.9 % NaCl with KCl 20 mEq / L 50 mL/hr at 06/27/19 0223   PRN Meds:.clonazePAM   Anti-infectives (From admission, onward)   None        Objective:   Vitals:   06/26/19 0743 06/26/19 2021 06/26/19 2117 06/27/19 0526  BP:   107/61 (!) 114/51  Pulse:   (!) 110 (!) 104  Resp:   16 20  Temp:   98.9 F (37.2 C) 98.6 F (37 C)  TempSrc:   Oral Oral  SpO2: 94% 94% 96% 97%  Weight:  Height:        Wt Readings from Last 3 Encounters:  06/26/19 56.9 kg  03/30/18 75.1 kg  12/28/17 72.9 kg     Intake/Output Summary (Last 24 hours) at 06/27/2019 1051 Last data filed at 06/27/2019 0900 Gross per 24 hour  Intake 100 ml  Output -  Net 100 ml    Physical Exam Gen:- Awake Alert,  In no apparent distress  HEENT:- [place].AT, No sclera icterus Neck-Supple Neck,No JVD,.  Lungs-  CTAB , fair symmetrical air movement CV- S1, S2 normal, regular  Abd-  +ve B.Sounds, Abd Soft, No tenderness,    Extremity/Skin:- No  edema, pedal pulses present  Psych-affect is appropriate, oriented x3 Neuro-generalized weakness, no new focal deficits, worsening Parkinson's tremors   Data Review:   Micro Results Recent Results (from the past 240 hour(s))   SARS CORONAVIRUS 2 (TAT 6-24 HRS) Nasopharyngeal Nasopharyngeal Swab     Status: None   Collection Time: 06/24/19  8:51 PM   Specimen: Nasopharyngeal Swab  Result Value Ref Range Status   SARS Coronavirus 2 NEGATIVE NEGATIVE Final    Comment: (NOTE) SARS-CoV-2 target nucleic acids are NOT DETECTED. The SARS-CoV-2 RNA is generally detectable in upper and lower respiratory specimens during the acute phase of infection. Negative results do not preclude SARS-CoV-2 infection, do not rule out co-infections with other pathogens, and should not be used as the sole basis for treatment or other patient management decisions. Negative results must be combined with clinical observations, patient history, and epidemiological information. The expected result is Negative. Fact Sheet for Patients: SugarRoll.be Fact Sheet for Healthcare Providers: https://www.woods-mathews.com/ This test is not yet approved or cleared by the [place] FDA and  has been authorized for detection and/or diagnosis of SARS-CoV-2 by FDA under an Emergency Use Authorization (EUA). This EUA will remain  in effect (meaning this test can be used) for the duration of the COVID-19 declaration under Section 56 4(b)(1) of the Act, 21 U.S.C. section 360bbb-3(b)(1), unless the authorization is terminated or revoked sooner. Performed at [facility], [place] [address] [place],  [address]     Radiology Reports Ct Abdomen Pelvis Wo Contrast  Result Date: 06/24/2019 CLINICAL DATA:  Nausea and vomiting with weight loss and abdominal pain, initial encounter EXAM: CT ABDOMEN AND PELVIS WITHOUT CONTRAST TECHNIQUE: Multidetector CT imaging of the abdomen and pelvis was performed following the standard protocol without IV contrast. COMPARISON:  10/19/2013 FINDINGS: Lower chest: No acute abnormality. Hepatobiliary: No focal liver abnormality is seen. Status post cholecystectomy. No  biliary dilatation. Pancreas: Unremarkable. No pancreatic ductal dilatation or surrounding inflammatory changes. Spleen: Normal in size without focal abnormality. Adrenals/Urinary Tract: Adrenal glands are within normal limits. Kidneys are well visualized bilaterally. No renal calculi or obstructive changes are seen. The bladder is partially distended. Stomach/Bowel: Postsurgical changes are noted consistent with lower anterior colonic resection. No obstructive changes are seen. The anastomosis is widely patent. The appendix has been surgically removed. No small bowel or gastric abnormality is seen. Vascular/Lymphatic: Aortic atherosclerosis. No enlarged abdominal or pelvic lymph nodes. Reproductive: Status post hysterectomy. No adnexal masses. Other: No abdominal wall hernia or abnormality. No abdominopelvic ascites. Musculoskeletal: Degenerative changes of lumbar spine are noted. IMPRESSION: No acute abnormality to correspond with patient's given clinical history is noted. Electronically Signed   By: Inez Catalina M.D.   On: 06/24/2019 20:38    CBC Recent Labs  Lab 06/24/19 1700 06/25/19 0547  WBC 5.0 4.8  HGB 11.5* 10.4*  HCT 36.1 33.1*  PLT 358 289  MCV 85.3 86.0  MCH 27.2 27.0  MCHC 31.9 31.4  RDW 13.2 13.2  LYMPHSABS 1.6  --   MONOABS 0.4  --   EOSABS 0.1  --   BASOSABS 0.0  --     Chemistries  Recent Labs  Lab 06/24/19 1700 06/25/19 0547 06/26/19 0610  NA 139 142 140  K 2.9* 3.0* 4.1  CL 102 109 112*  CO2 19* 21* 22  GLUCOSE 125* 79 114*  BUN 53* 30* 10  CREATININE 2.77* 1.65* 1.12*  CALCIUM 10.0 9.2 9.3  AST 19 12*  --   ALT 15 14  --   ALKPHOS 96 81  --   BILITOT 0.6 0.7  --    ------------------------------------------------------------------------------------------------------------------ No results for input(s): CHOL, HDL, LDLCALC, TRIG, CHOLHDL, LDLDIRECT in the last 72 hours.  Lab Results  Component Value Date   HGBA1C 6.1 (H) 08/10/2015    ------------------------------------------------------------------------------------------------------------------ No results for input(s): TSH, T4TOTAL, T3FREE, THYROIDAB in the last 72 hours.  Invalid input(s): FREET3 ------------------------------------------------------------------------------------------------------------------ No results for input(s): VITAMINB12, FOLATE, FERRITIN, TIBC, IRON, RETICCTPCT in the last 72 hours.  Coagulation profile No results for input(s): INR, PROTIME in the last 168 hours.  No results for input(s): DDIMER in the last 72 hours.  Cardiac Enzymes No results for input(s): CKMB, TROPONINI, MYOGLOBIN in the last 168 hours.  Invalid input(s): CK ------------------------------------------------------------------------------------------------------------------ No results found for: BNP   Roxan Hockey M.D on 06/27/2019 at 10:51 AM  Go to [email or web address] - for contact info  [facility] - Office  [phone]

## 2019-06-27 NOTE — Progress Notes (Signed)
Pt has had fair day. Alert, oriented to person and place, but has periods of hallucinations and paranoid thoughts. Earlier today, adamantly stating that a man was in the hallway beating the children and he was also a wife beater. Pt could not identify who "the man" was, but kept looking out of the door of her room and whispering to staff. Unable to orient patient and assure her there was no man in the hall beating anyone. Pt then began to talk to the television and again was fussing about "that man" who was hiding on the TV. Again, unable to reassure pt that there was no one hiding on the TV. Pt's daughter here to visit after lunch, states that patient has been having hallucinations for a few months now. Currently, pt remains up in chair watching TV. Answers most questions about herself and her children correctly, still has to be prompted for place and time.

## 2019-06-28 LAB — TROPONIN I (HIGH SENSITIVITY)
Troponin I (High Sensitivity): 7 ng/L (ref <18)
Troponin I (High Sensitivity): 9 ng/L (ref <18)

## 2019-06-28 LAB — GLUCOSE, CAPILLARY
Glucose-Capillary: 100 mg/dL — ABNORMAL HIGH (ref 70–99)
Glucose-Capillary: 106 mg/dL — ABNORMAL HIGH (ref 70–99)
Glucose-Capillary: 120 mg/dL — ABNORMAL HIGH (ref 70–99)
Glucose-Capillary: 138 mg/dL — ABNORMAL HIGH (ref 70–99)
Glucose-Capillary: 223 mg/dL — ABNORMAL HIGH (ref 70–99)
Glucose-Capillary: 90 mg/dL (ref 70–99)
Glucose-Capillary: 90 mg/dL (ref 70–99)

## 2019-06-28 LAB — CBC
HCT: 31.4 % — ABNORMAL LOW (ref 36.0–46.0)
Hemoglobin: 9.8 g/dL — ABNORMAL LOW (ref 12.0–15.0)
MCH: 27.1 pg (ref 26.0–34.0)
MCHC: 31.2 g/dL (ref 30.0–36.0)
MCV: 86.7 fL (ref 80.0–100.0)
Platelets: 263 10*3/uL (ref 150–400)
RBC: 3.62 MIL/uL — ABNORMAL LOW (ref 3.87–5.11)
RDW: 13.7 % (ref 11.5–15.5)
WBC: 4.1 10*3/uL (ref 4.0–10.5)
nRBC: 0 % (ref 0.0–0.2)

## 2019-06-28 LAB — BASIC METABOLIC PANEL
Anion gap: 12 (ref 5–15)
BUN: 9 mg/dL (ref 8–23)
CO2: 21 mmol/L — ABNORMAL LOW (ref 22–32)
Calcium: 9.5 mg/dL (ref 8.9–10.3)
Chloride: 109 mmol/L (ref 98–111)
Creatinine, Ser: 1.3 mg/dL — ABNORMAL HIGH (ref 0.44–1.00)
GFR calc Af Amer: 51 mL/min — ABNORMAL LOW (ref >60)
GFR calc non Af Amer: 44 mL/min — ABNORMAL LOW (ref >60)
Glucose, Bld: 91 mg/dL (ref 70–99)
Potassium: 3.9 mmol/L (ref 3.5–5.1)
Sodium: 142 mmol/L (ref 135–145)

## 2019-06-28 LAB — MAGNESIUM: Magnesium: 1.6 mg/dL — ABNORMAL LOW (ref 1.7–2.4)

## 2019-06-28 LAB — D-DIMER, QUANTITATIVE: D-Dimer, Quant: 0.4 ug/mL-FEU (ref 0.00–0.50)

## 2019-06-28 MED ORDER — MAGNESIUM SULFATE 2 GM/50ML IV SOLN
2.0000 g | Freq: Once | INTRAVENOUS | Status: AC
  Administered 2019-06-28: 2 g via INTRAVENOUS
  Filled 2019-06-28: qty 50

## 2019-06-28 MED ORDER — TRAZODONE HCL 50 MG PO TABS
100.0000 mg | ORAL_TABLET | Freq: Every day | ORAL | Status: DC
  Administered 2019-06-28 – 2019-07-02 (×5): 100 mg via ORAL
  Filled 2019-06-28 (×5): qty 2

## 2019-06-28 MED ORDER — METOPROLOL TARTRATE 25 MG PO TABS
25.0000 mg | ORAL_TABLET | Freq: Two times a day (BID) | ORAL | Status: DC
  Administered 2019-06-28 – 2019-07-03 (×10): 25 mg via ORAL
  Filled 2019-06-28 (×11): qty 1

## 2019-06-28 MED ORDER — ALPRAZOLAM 0.5 MG PO TABS
0.5000 mg | ORAL_TABLET | Freq: Three times a day (TID) | ORAL | Status: DC | PRN
  Administered 2019-06-28 – 2019-07-01 (×4): 0.5 mg via ORAL
  Filled 2019-06-28 (×4): qty 1

## 2019-06-28 NOTE — Progress Notes (Signed)
Patient Demographics:    Linda English, is a 63 y.o. female, DOB - [birth year], [id number]  Admit date - 06/24/2019   Admitting Physician Jani Gravel, MD  Outpatient Primary MD for the patient is [name], MD  LOS - 2   Chief Complaint  Patient presents with  . Nausea        Subjective:    Chanin Wax today has no fevers,  No chest pain, c/o weakness,  -Episodes of occasional confusion and disorientation with hallucinations, -Had tachycardia at the EKG with sinus reading -Patient daughter visited at bedside today, plan of care  discussed with daughter,  Assessment  & Plan :    Principal Problem:   ARF (acute renal failure) (HCC) Active Problems:   Diabetes ([place])   Nausea & vomiting   AKI (acute kidney injury) ([place])  Brief Summary 63 y.o. female, Bipolar, hypertension, Dm2, Parkinsons disease, h/o rectal cancer, Gerd, Gastric erosions admitted with intractable emesis, AKI and electrolyte derangement -Patient with occasional episodes of confusion  A/p 1)AKI----acute kidney injury due to intractable emesis creatinine on admission=2.77  ,   baseline creatinine =1.1    , creatinine is now= 1.3     , renally adjust medications, avoid nephrotoxic agents/dehydration/hypotension - AKI resolving with IV fluids  2) intractable emesis /FEN-hypokalemia resolved with replacement  --No further emesis,  C/n  carb modified  3) generalized weakness and deconditioning --very unsteady gait, high fall risk, physical therapy eval appreciated, -Recommend SNF rehab  4) Parkinson's disease- tremors and gait problems have worsened most likely due to subtherapeutic levels of medications in the setting of recurrent emesis PTA ------ continue current meds -Continue amantadine -Patient sees Dr. Merlene Laughter  5)HTN-stable, continue amlodipine  6) sinus tachycardia with anxiety--- EKG with sinus  rhythm, serial troponins and D-dimer not elevated, -use metoprolol 25 mg twice daily, and Xanax as needed, continue Lamictal  Disposition/Need for in-Hospital Stay- patient unable to be discharged at this time due to --- unsteady gait, high fall risk, unsafe discharge plan to home, awaiting placement to SNF rehab  Code Status : Full code  Family Communication:   (patient is alert, awake and coherent) -Left Voicemail for Daughter- Ms Nashali [id number]  Disposition Plan  : Awaiting transfer to SNF rehab Consults  :  Na  DVT Prophylaxis  :  Lovenox -   Lab Results  Component Value Date   PLT 263 06/28/2019    Inpatient Medications  Scheduled Meds: . amantadine  100 mg Oral BID  . amLODipine  10 mg Oral Daily  . atorvastatin  10 mg Oral q1800  . enoxaparin (LOVENOX) injection  30 mg Subcutaneous Q24H  . feeding supplement  1 Container Oral TID BM  . insulin aspart  0-9 Units Subcutaneous Q4H  . lamoTRIgine  200 mg Oral BID  . levETIRAcetam  1,500 mg Oral QHS  . metoprolol tartrate  25 mg Oral BID  . pantoprazole  40 mg Oral BID  . traZODone  100 mg Oral QHS   Continuous Infusions: . 0.9 % NaCl with KCl 20 mEq / L 50 mL/hr at 06/27/19 2248   PRN Meds:.ALPRAZolam   Anti-infectives (From admission, onward)   None        Objective:  Vitals:   06/28/19 0500 06/28/19 1232 06/28/19 1242 06/28/19 1333  BP:  126/74  91/61  Pulse:   (!) 117 (!) 128  Resp:    20  Temp:    99.1 F (37.3 C)  TempSrc:    Oral  SpO2:      Weight: 58.5 kg     Height:        Wt Readings from Last 3 Encounters:  06/28/19 58.5 kg  03/30/18 75.1 kg  12/28/17 72.9 kg     Intake/Output Summary (Last 24 hours) at 06/28/2019 1402 Last data filed at 06/28/2019 0700 Gross per 24 hour  Intake 600 ml  Output -  Net 600 ml    Physical Exam Gen:- Awake Alert,  In no apparent distress  HEENT:- [place].AT, No sclera icterus Neck-Supple Neck,No JVD,.  Lungs-  CTAB , fair  symmetrical air movement CV- S1, S2 normal, regular  Abd-  +ve B.Sounds, Abd Soft, No tenderness,    Extremity/Skin:- No  edema, pedal pulses present  Psych-affect is appropriate, oriented x3 Neuro-generalized weakness, no new focal deficits, worsening Parkinson's tremors   Data Review:   Micro Results Recent Results (from the past 240 hour(s))  SARS CORONAVIRUS 2 (TAT 6-24 HRS) Nasopharyngeal Nasopharyngeal Swab     Status: None   Collection Time: 06/24/19  8:51 PM   Specimen: Nasopharyngeal Swab  Result Value Ref Range Status   SARS Coronavirus 2 NEGATIVE NEGATIVE Final    Comment: (NOTE) SARS-CoV-2 target nucleic acids are NOT DETECTED. The SARS-CoV-2 RNA is generally detectable in upper and lower respiratory specimens during the acute phase of infection. Negative results do not preclude SARS-CoV-2 infection, do not rule out co-infections with other pathogens, and should not be used as the sole basis for treatment or other patient management decisions. Negative results must be combined with clinical observations, patient history, and epidemiological information. The expected result is Negative. Fact Sheet for Patients: SugarRoll.be Fact Sheet for Healthcare Providers: https://www.woods-mathews.com/ This test is not yet approved or cleared by the [place] FDA and  has been authorized for detection and/or diagnosis of SARS-CoV-2 by FDA under an Emergency Use Authorization (EUA). This EUA will remain  in effect (meaning this test can be used) for the duration of the COVID-19 declaration under Section 56 4(b)(1) of the Act, 21 U.S.C. section 360bbb-3(b)(1), unless the authorization is terminated or revoked sooner. Performed at [facility], [place] [address] [place], [place] [address]     Radiology Reports Ct Abdomen Pelvis Wo Contrast  Result Date: 06/24/2019 CLINICAL DATA:  Nausea and vomiting with weight loss and  abdominal pain, initial encounter EXAM: CT ABDOMEN AND PELVIS WITHOUT CONTRAST TECHNIQUE: Multidetector CT imaging of the abdomen and pelvis was performed following the standard protocol without IV contrast. COMPARISON:  10/19/2013 FINDINGS: Lower chest: No acute abnormality. Hepatobiliary: No focal liver abnormality is seen. Status post cholecystectomy. No biliary dilatation. Pancreas: Unremarkable. No pancreatic ductal dilatation or surrounding inflammatory changes. Spleen: Normal in size without focal abnormality. Adrenals/Urinary Tract: Adrenal glands are within normal limits. Kidneys are well visualized bilaterally. No renal calculi or obstructive changes are seen. The bladder is partially distended. Stomach/Bowel: Postsurgical changes are noted consistent with lower anterior colonic resection. No obstructive changes are seen. The anastomosis is widely patent. The appendix has been surgically removed. No small bowel or gastric abnormality is seen. Vascular/Lymphatic: Aortic atherosclerosis. No enlarged abdominal or pelvic lymph nodes. Reproductive: Status post hysterectomy. No adnexal masses. Other: No abdominal wall hernia or  abnormality. No abdominopelvic ascites. Musculoskeletal: Degenerative changes of lumbar spine are noted. IMPRESSION: No acute abnormality to correspond with patient's given clinical history is noted. Electronically Signed   By: Inez Catalina M.D.   On: 06/24/2019 20:38    CBC Recent Labs  Lab 06/24/19 1700 06/25/19 0547 06/28/19 0347  WBC 5.0 4.8 4.1  HGB 11.5* 10.4* 9.8*  HCT 36.1 33.1* 31.4*  PLT 358 289 263  MCV 85.3 86.0 86.7  MCH 27.2 27.0 27.1  MCHC 31.9 31.4 31.2  RDW 13.2 13.2 13.7  LYMPHSABS 1.6  --   --   MONOABS 0.4  --   --   EOSABS 0.1  --   --   BASOSABS 0.0  --   --     Chemistries  Recent Labs  Lab 06/24/19 1700 06/25/19 0547 06/26/19 0610 06/28/19 0347  NA 139 142 140 142  K 2.9* 3.0* 4.1 3.9  CL 102 109 112* 109  CO2 19* 21* 22 21*   GLUCOSE 125* 79 114* 91  BUN 53* 30* 10 9  CREATININE 2.77* 1.65* 1.12* 1.30*  CALCIUM 10.0 9.2 9.3 9.5  MG  --   --   --  1.6*  AST 19 12*  --   --   ALT 15 14  --   --   ALKPHOS 96 81  --   --   BILITOT 0.6 0.7  --   --    ------------------------------------------------------------------------------------------------------------------ No results for input(s): CHOL, HDL, LDLCALC, TRIG, CHOLHDL, LDLDIRECT in the last 72 hours.  Lab Results  Component Value Date   HGBA1C 6.1 (H) 08/10/2015   ------------------------------------------------------------------------------------------------------------------ No results for input(s): TSH, T4TOTAL, T3FREE, THYROIDAB in the last 72 hours.  Invalid input(s): FREET3 ------------------------------------------------------------------------------------------------------------------ No results for input(s): VITAMINB12, FOLATE, FERRITIN, TIBC, IRON, RETICCTPCT in the last 72 hours.  Coagulation profile No results for input(s): INR, PROTIME in the last 168 hours.  Recent Labs    06/28/19 0133  DDIMER 0.40    Cardiac Enzymes No results for input(s): CKMB, TROPONINI, MYOGLOBIN in the last 168 hours.  Invalid input(s): CK ------------------------------------------------------------------------------------------------------------------ No results found for: BNP   Roxan Hockey M.D on 06/28/2019 at 2:02 PM  Go to [email or web address] - for contact info  [facility] - Office  [phone]

## 2019-06-28 NOTE — Progress Notes (Signed)
Central Tele reports pt's heartrate sustaining in 140's ST. Upon assessment, pt up on bedside commode having BM. Apical heart rate validated at 134 bpm.  Pt with generalized tremors of entire body, but arms and legs restless. Pt talking/mumbling to no one in particular, even though daughter is at bedside. When asked who she is talking to, pt just smiles and darts her eyes side to side. Pt states, "I don't care what she said, there ain't nothing going on in this hospital!". When asked pt what she meant, pt again just smiled and continued to dart eyes side to side. Pt's daughter states pt has not been making sense in her conversation, which is supported by my multiple interactions with patient today. Other VSS, Alparazolam 0.5 mg po given per order for anxiety. MD notified of elevated heart rate. Will reevaluate heart rate once pt back in chair (off BSC) and after medication.

## 2019-06-28 NOTE — Progress Notes (Signed)
Pt back in chair, had large formed BM. Taking Ensure at this time, daughter at bedside. Current heart rate by tele and verified apically is 113 bpm. Pt still oriented to person and place only, continues to mumble to self. Door to room closed per daughter's request to decrease outside stimulation from noise in hallway. Daughter requested to talk with both MD and case Freight forwarder. Both contacted and notified of daughter's request.

## 2019-06-28 NOTE — Progress Notes (Signed)
MD notified, pt up on bedside commode and hallucinations.

## 2019-06-29 LAB — RENAL FUNCTION PANEL
Albumin: 3.6 g/dL (ref 3.5–5.0)
Anion gap: 9 (ref 5–15)
BUN: 15 mg/dL (ref 8–23)
CO2: 21 mmol/L — ABNORMAL LOW (ref 22–32)
Calcium: 9.2 mg/dL (ref 8.9–10.3)
Chloride: 109 mmol/L (ref 98–111)
Creatinine, Ser: 1.25 mg/dL — ABNORMAL HIGH (ref 0.44–1.00)
GFR calc Af Amer: 54 mL/min — ABNORMAL LOW (ref >60)
GFR calc non Af Amer: 46 mL/min — ABNORMAL LOW (ref >60)
Glucose, Bld: 150 mg/dL — ABNORMAL HIGH (ref 70–99)
Phosphorus: 2.4 mg/dL — ABNORMAL LOW (ref 2.5–4.6)
Potassium: 3.5 mmol/L (ref 3.5–5.1)
Sodium: 139 mmol/L (ref 135–145)

## 2019-06-29 LAB — GLUCOSE, CAPILLARY
Glucose-Capillary: 101 mg/dL — ABNORMAL HIGH (ref 70–99)
Glucose-Capillary: 117 mg/dL — ABNORMAL HIGH (ref 70–99)
Glucose-Capillary: 112 mg/dL — ABNORMAL HIGH (ref 70–99)
Glucose-Capillary: 113 mg/dL — ABNORMAL HIGH (ref 70–99)
Glucose-Capillary: 138 mg/dL — ABNORMAL HIGH (ref 70–99)

## 2019-06-29 MED ORDER — POTASSIUM CHLORIDE CRYS ER 20 MEQ PO TBCR
40.0000 meq | EXTENDED_RELEASE_TABLET | Freq: Once | ORAL | Status: AC
  Administered 2019-06-29: 40 meq via ORAL
  Filled 2019-06-29: qty 2

## 2019-06-29 NOTE — Care Management Important Message (Signed)
Important Message  Patient Details  Name: Linda English MRN: [id number] Date of Birth: 1955/12/31   Medicare Important Message Given:  Yes     Tommy Medal 06/29/2019, 3:50 PM

## 2019-06-29 NOTE — Progress Notes (Signed)
Patient Demographics:    Linda English, is a 63 y.o. female, DOB - May 22, 1956, [id number]  Admit date - 06/24/2019   Admitting Physician Jani [place]vel, MD  Outpatient Primary MD for the patient is [name], MD  LOS - 3   Chief Complaint  Patient presents with  . Nausea        Subjective:    Linda English today has no fevers,  No chest pain,  - More cooperative, less confusional episodes -No new complaints, oral intake is fair  Assessment  & Plan :    Principal Problem:   ARF (acute renal failure) (HCC) Active Problems:   Diabetes (HCC)   Nausea & vomiting   AKI (acute kidney injury) ([place])  Brief Summary 63 y.o. female, Bipolar, hypertension, Dm2, Parkinsons disease, h/o rectal cancer, Gerd, Gastric erosions admitted with intractable emesis, AKI and electrolyte derangement-resolved -Patient with occasional episodes of confusion --awaiting insurance approval to go to [facility] SNF  A/p 1)AKI----acute kidney injury due to intractable emesis creatinine on admission=2.77  ,   baseline creatinine =1.1    , creatinine is now= 1.25     , renally adjust medications, avoid nephrotoxic agents/dehydration/hypotension - AKI resolving with IV fluids -Continue to encourage adequate oral intake  2) intractable emesis /FEN- hypokalemia resolved with replacement  --No further emesis,  C/n  carb modified  3)Generalized weakness and deconditioning --very unsteady gait, high fall risk, physical therapy eval appreciated, -Recommend SNF rehab  4)Parkinson's disease- tremors and gait problems have worsened most likely due to subtherapeutic levels of medications in the setting of recurrent emesis PTA ------ continue current meds -Continue amantadine -Patient sees Dr. Merlene Laughter  5)HTN-stable, continue amlodipine  6) sinus tachycardia with anxiety--- EKG with sinus rhythm, serial troponins and  D-dimer not elevated, -use metoprolol 25 mg twice daily, and Xanax as needed, continue Lamictal  Disposition/Need for in-Hospital Stay- patient unable to be discharged at this time due to --- unsteady gait, high fall risk, unsafe discharge plan to home --awaiting insurance approval to go to [place] SNF  Code Status : Full code  Family Communication:   (patient is alert, awake and coherent) Discussed with Daughter- Ms Darrell [id number]  Disposition Plan  : -awaiting insurance approval to go to [facility] SNF  Consults  :  Na  DVT Prophylaxis  :  Lovenox -   Lab Results  Component Value Date   PLT 263 06/28/2019    Inpatient Medications  Scheduled Meds: . amantadine  100 mg Oral BID  . amLODipine  10 mg Oral Daily  . atorvastatin  10 mg Oral q1800  . enoxaparin (LOVENOX) injection  30 mg Subcutaneous Q24H  . feeding supplement  1 Container Oral TID BM  . insulin aspart  0-9 Units Subcutaneous Q4H  . lamoTRIgine  200 mg Oral BID  . levETIRAcetam  1,500 mg Oral QHS  . metoprolol tartrate  25 mg Oral BID  . pantoprazole  40 mg Oral BID  . potassium chloride  40 mEq Oral Once  . traZODone  100 mg Oral QHS   Continuous Infusions: . 0.9 % NaCl with KCl 20 mEq / L 50 mL/hr at 06/28/19 2132   PRN Meds:.ALPRAZolam   Anti-infectives (From admission, onward)   None  Objective:   Vitals:   06/29/19 0454 06/29/19 0700 06/29/19 1400 06/29/19 1600  BP: 117/69 115/68 112/64 (!) 104/58  Pulse: 96 (!) 101 87 75  Resp: 18 18 16 16   Temp: 98.7 F (37.1 C) 98.2 F (36.8 C) 98.5 F (36.9 C) 98.4 F (36.9 C)  TempSrc:  Oral Oral Oral  SpO2: 100% 100% 93% 100%  Weight:      Height:        Wt Readings from Last 3 Encounters:  06/28/19 58.5 kg  03/30/18 75.1 kg  12/28/17 72.9 kg     Intake/Output Summary (Last 24 hours) at 06/29/2019 1829 Last data filed at 06/29/2019 1400 Gross per 24 hour  Intake 590 ml  Output 1100 ml  Net -510 ml    Physical  Exam Gen:- Awake Alert,  In no apparent distress  HEENT:- [place].AT, No sclera icterus Neck-Supple Neck,No JVD,.  Lungs-  CTAB , fair symmetrical air movement CV- S1, S2 normal, regular  Abd-  +ve B.Sounds, Abd Soft, No tenderness,    Extremity/Skin:- No  edema, pedal pulses present  Psych-affect is appropriate, oriented x3 (occasional confusional episodes) Neuro-generalized weakness, no new focal deficits, +ve  Parkinson's tremors   Data Review:   Micro Results Recent Results (from the past 240 hour(s))  SARS CORONAVIRUS 2 (TAT 6-24 HRS) Nasopharyngeal Nasopharyngeal Swab     Status: None   Collection Time: 06/24/19  8:51 PM   Specimen: Nasopharyngeal Swab  Result Value Ref Range Status   SARS Coronavirus 2 NEGATIVE NEGATIVE Final    Comment: (NOTE) SARS-CoV-2 target nucleic acids are NOT DETECTED. The SARS-CoV-2 RNA is generally detectable in upper and lower respiratory specimens during the acute phase of infection. Negative results do not preclude SARS-CoV-2 infection, do not rule out co-infections with other pathogens, and should not be used as the sole basis for treatment or other patient management decisions. Negative results must be combined with clinical observations, patient history, and epidemiological information. The expected result is Negative. Fact Sheet for Patients: SugarRoll.be Fact Sheet for Healthcare Providers: https://www.woods-mathews.com/ This test is not yet approved or cleared by the [place] FDA and  has been authorized for detection and/or diagnosis of SARS-CoV-2 by FDA under an Emergency Use Authorization (EUA). This EUA will remain  in effect (meaning this test can be used) for the duration of the COVID-19 declaration under Section 56 4(b)(1) of the Act, 21 U.S.C. section 360bbb-3(b)(1), unless the authorization is terminated or revoked sooner. Performed at [facility], [place] [address],  [place], [place] [address]     Radiology Reports Ct Abdomen Pelvis Wo Contrast  Result Date: 06/24/2019 CLINICAL DATA:  Nausea and vomiting with weight loss and abdominal pain, initial encounter EXAM: CT ABDOMEN AND PELVIS WITHOUT CONTRAST TECHNIQUE: Multidetector CT imaging of the abdomen and pelvis was performed following the standard protocol without IV contrast. COMPARISON:  10/19/2013 FINDINGS: Lower chest: No acute abnormality. Hepatobiliary: No focal liver abnormality is seen. Status post cholecystectomy. No biliary dilatation. Pancreas: Unremarkable. No pancreatic ductal dilatation or surrounding inflammatory changes. Spleen: Normal in size without focal abnormality. Adrenals/Urinary Tract: Adrenal glands are within normal limits. Kidneys are well visualized bilaterally. No renal calculi or obstructive changes are seen. The bladder is partially distended. Stomach/Bowel: Postsurgical changes are noted consistent with lower anterior colonic resection. No obstructive changes are seen. The anastomosis is widely patent. The appendix has been surgically removed. No small bowel or gastric abnormality is seen. Vascular/Lymphatic: Aortic atherosclerosis. No enlarged abdominal or  pelvic lymph nodes. Reproductive: Status post hysterectomy. No adnexal masses. Other: No abdominal wall hernia or abnormality. No abdominopelvic ascites. Musculoskeletal: Degenerative changes of lumbar spine are noted. IMPRESSION: No acute abnormality to correspond with patient's given clinical history is noted. Electronically Signed   By: Inez Catalina M.D.   On: 06/24/2019 20:38    CBC Recent Labs  Lab 06/24/19 1700 06/25/19 0547 06/28/19 0347  WBC 5.0 4.8 4.1  HGB 11.5* 10.4* 9.8*  HCT 36.1 33.1* 31.4*  PLT 358 289 263  MCV 85.3 86.0 86.7  MCH 27.2 27.0 27.1  MCHC 31.9 31.4 31.2  RDW 13.2 13.2 13.7  LYMPHSABS 1.6  --   --   MONOABS 0.4  --   --   EOSABS 0.1  --   --   BASOSABS 0.0  --   --     Chemistries  Recent  Labs  Lab 06/24/19 1700 06/25/19 0547 06/26/19 0610 06/28/19 0347 06/29/19 0625  NA 139 142 140 142 139  K 2.9* 3.0* 4.1 3.9 3.5  CL 102 109 112* 109 109  CO2 19* 21* 22 21* 21*  GLUCOSE 125* 79 114* 91 150*  BUN 53* 30* 10 9 15   CREATININE 2.77* 1.65* 1.12* 1.30* 1.25*  CALCIUM 10.0 9.2 9.3 9.5 9.2  MG  --   --   --  1.6*  --   AST 19 12*  --   --   --   ALT 15 14  --   --   --   ALKPHOS 96 81  --   --   --   BILITOT 0.6 0.7  --   --   --    ------------------------------------------------------------------------------------------------------------------ No results for input(s): CHOL, HDL, LDLCALC, TRIG, CHOLHDL, LDLDIRECT in the last 72 hours.  Lab Results  Component Value Date   HGBA1C 6.1 (H) 08/10/2015   ------------------------------------------------------------------------------------------------------------------ No results for input(s): TSH, T4TOTAL, T3FREE, THYROIDAB in the last 72 hours.  Invalid input(s): FREET3 ------------------------------------------------------------------------------------------------------------------ No results for input(s): VITAMINB12, FOLATE, FERRITIN, TIBC, IRON, RETICCTPCT in the last 72 hours.  Coagulation profile No results for input(s): INR, PROTIME in the last 168 hours.  Recent Labs    06/28/19 0133  DDIMER 0.40    Cardiac Enzymes No results for input(s): CKMB, TROPONINI, MYOGLOBIN in the last 168 hours.  Invalid input(s): CK ------------------------------------------------------------------------------------------------------------------ No results found for: BNP   Roxan Hockey M.D on 06/29/2019 at 6:29 PM  Go to [email or web address] - for contact info  [facility] - Office  [phone]

## 2019-06-29 NOTE — Progress Notes (Signed)
Physical Therapy Treatment Patient Details Name: Linda English MRN: [id number] DOB: 01/09/1956 Today's Date: 06/29/2019    History of Present Illness Linda English  is a 63 y.o. female, Bipolar, hypertension, Dm2, Parkinsons disease, h/o rectal cancer, Gerd, Gastric erosions, presents with intractable n/v,  For the past 2 months .  At least 2 x per day.  Pt notes taking ibuprofen at times.  Pt noted diarrhea about 3 days ago.  Pt denies fever, chills, abd pain, brbpr,    PT Comments    Patient tolerated sitting up at bedside for approximately 20 minutes while performing BLE ROM/strengthening exercises, frequent leaning to the left requiring verbal/tactile cueing to keep trunk in midline, has difficulty with sequencing and completing multiple tasks while completing exercises, has excess tremors when standing requiring support downward at shoulders to decrease and able to take a few steps at bedside requiring much support from behind to avoid falling backwards.  Patient tolerated sitting up in chair after therapy - RN/NT notified.  Patient will benefit from continued physical therapy in hospital and recommended venue below to increase strength, balance, endurance for safe ADLs and gait.   Follow Up Recommendations  SNF;Supervision for mobility/OOB;Supervision/Assistance - 24 hour     Equipment Recommendations  None recommended by PT    Recommendations for Other Services       Precautions / Restrictions Precautions Precautions: Fall Restrictions Weight Bearing Restrictions: No    Mobility  Bed Mobility Overal bed mobility: Needs Assistance Bed Mobility: Supine to Sit     Supine to sit: Mod assist     General bed mobility comments: slow labored shaky movement  Transfers Overall transfer level: Needs assistance Equipment used: Rolling walker (2 wheeled) Transfers: Sit to/from [facility] Sit to Stand: Mod assist Stand pivot transfers: Mod  assist;Max assist       General transfer comment: labored movement, unsteady due to severe tremors  Ambulation/Gait Ambulation/Gait assistance: Max assist Gait Distance (Feet): 5 Feet Assistive device: Rolling walker (2 wheeled) Gait Pattern/deviations: Decreased step length - right;Decreased step length - left;Decreased stride length Gait velocity: slow   General Gait Details: limited to 5-6 slow unsteady shaky steps, able to take 2-3 steps forward before leaning backwards, had to supported from behind to avoid falling   Stairs             Wheelchair Mobility    Modified Rankin (Stroke Patients Only)       Balance Overall balance assessment: Needs assistance Sitting-balance support: Feet supported;No upper extremity supported Sitting balance-Leahy Scale: Poor Sitting balance - Comments: fair with BUE supported Postural control: Left lateral lean Standing balance support: During functional activity;Bilateral upper extremity supported Standing balance-Leahy Scale: Poor Standing balance comment: using RW                            Cognition Arousal/Alertness: Awake/alert Behavior During Therapy: WFL for tasks assessed/performed Overall Cognitive Status: Impaired/Different from baseline Area of Impairment: Awareness;Problem solving                             Problem Solving: Slow processing;Requires verbal cues;Requires tactile cues General Comments: Appears slightly confused but cooperative      Exercises General Exercises - Lower Extremity Long Arc Quad: Seated;AROM;Strengthening;Both;10 reps Hip Flexion/Marching: Seated;AROM;Strengthening;Both;10 reps Toe Raises: Seated;AROM;Strengthening;Both;10 reps Heel Raises: Seated;AROM;Strengthening;Both;10 reps    General Comments  Pertinent Vitals/Pain Pain Assessment: No/denies pain    Home Living                      Prior Function            PT Goals (current  goals can now be found in the care plan section) Acute Rehab PT Goals Patient Stated Goal: return home with family to assist PT Goal Formulation: With patient Time For Goal Achievement: 07/10/19 Potential to Achieve Goals: Good Progress towards PT goals: Progressing toward goals    Frequency    Min 3X/week      PT Plan Current plan remains appropriate    Co-evaluation              AM-PAC PT "6 Clicks" Mobility   Outcome Measure  Help needed turning from your back to your side while in a flat bed without using bedrails?: A Little Help needed moving from lying on your back to sitting on the side of a flat bed without using bedrails?: A Lot Help needed moving to and from a bed to a chair (including a wheelchair)?: A Lot Help needed standing up from a chair using your arms (e.g., wheelchair or bedside chair)?: A Lot Help needed to walk in hospital room?: A Lot Help needed climbing 3-5 steps with a railing? : Total 6 Click Score: 12    End of Session Equipment Utilized During Treatment: Gait belt Activity Tolerance: Patient limited by fatigue;Patient tolerated treatment well Patient left: in chair;with call bell/phone within reach;with chair alarm set Nurse Communication: Mobility status PT Visit Diagnosis: Unsteadiness on feet (R26.81);Other abnormalities of gait and mobility (R26.89);Muscle weakness (generalized) (M62.81)     Time: [id number] PT Time Calculation (min) (ACUTE ONLY): 30 min  Charges:  $Therapeutic Exercise: 8-22 mins $Therapeutic Activity: 8-22 mins                     11:29 AM, 06/29/19 Lonell Grandchild, MPT Physical Therapist with [facility] [phone] office [phone] mobile phone

## 2019-06-30 LAB — GLUCOSE, CAPILLARY
Glucose-Capillary: 102 mg/dL — ABNORMAL HIGH (ref 70–99)
Glucose-Capillary: 102 mg/dL — ABNORMAL HIGH (ref 70–99)
Glucose-Capillary: 131 mg/dL — ABNORMAL HIGH (ref 70–99)
Glucose-Capillary: 75 mg/dL (ref 70–99)
Glucose-Capillary: 83 mg/dL (ref 70–99)
Glucose-Capillary: 94 mg/dL (ref 70–99)
Glucose-Capillary: 98 mg/dL (ref 70–99)

## 2019-06-30 NOTE — Progress Notes (Signed)
Patient's daughter is the room and is concerned because when she came to the ER on Sunday she stated that the patient had fallen and hit her head on an end table at home. According to the daughter the knot she sustained is still present on the patient's head and her mother is still "not acting like herself and talking out of her head". Her daughter wants to know if there is any way MD can look at her head. MD is made aware of daughter's requests.

## 2019-06-30 NOTE — Progress Notes (Signed)
Subjective: Patient no longer has vomiting or nausea.  She appears to be intermittently confused.  She is awaiting disposition to skilled nursing facility.   Objective: Vital signs in last 24 hours: Temp:  [98.4 F (36.9 C)-98.5 F (36.9 C)] 98.4 F (36.9 C) (10/30 1600) Pulse Rate:  [75-94] 80 (10/31 0405) Resp:  [16-18] 18 (10/31 0405) BP: (104-122)/(58-72) 122/72 (10/31 0405) SpO2:  [92 %-100 %] 100 % (10/31 0405) Weight:  [58.1 kg] 58.1 kg (10/31 0405) She is alert, hemodynamically stable.  She is afebrile.  She knows where she is and what date is but she does appear to be somewhat confused. Intake/Output from previous day: 10/30 0701 - 10/31 0700 In: 1070 [P.O.:720; I.V.:350] Out: 1300 [Urine:1300] Intake/Output this shift: No intake/output data recorded.  Recent Labs    06/28/19 0347  HGB 9.8*   Recent Labs    06/28/19 0347  WBC 4.1  RBC 3.62*  HCT 31.4*  PLT 263   Recent Labs    06/28/19 0347 06/29/19 0625  NA 142 139  K 3.9 3.5  CL 109 109  CO2 21* 21*  BUN 9 15  CREATININE 1.30* 1.25*  GLUCOSE 91 150*  CALCIUM 9.5 9.2     Assessment/Plan: 1.  Acute kidney injury with resolving creatinine.  She may be at her baseline.  Continue with IV fluids for the time being and encourage oral intake. 2.  Generalized weakness/deconditioning with unsteady gait and a high fall risk according to physical therapy.  Await skilled nursing facility disposition.   Nimish C Gosrani 06/30/2019, 11:23 AM

## 2019-07-01 ENCOUNTER — Inpatient Hospital Stay (HOSPITAL_COMMUNITY): Payer: Medicare Other

## 2019-07-01 LAB — BASIC METABOLIC PANEL
Anion gap: 10 (ref 5–15)
BUN: 13 mg/dL (ref 8–23)
CO2: 23 mmol/L (ref 22–32)
Calcium: 9.8 mg/dL (ref 8.9–10.3)
Chloride: 107 mmol/L (ref 98–111)
Creatinine, Ser: 1 mg/dL (ref 0.44–1.00)
GFR calc Af Amer: >60 mL/min (ref >60)
GFR calc non Af Amer: >60 mL/min (ref >60)
Glucose, Bld: 101 mg/dL — ABNORMAL HIGH (ref 70–99)
Potassium: 4 mmol/L (ref 3.5–5.1)
Sodium: 140 mmol/L (ref 135–145)

## 2019-07-01 LAB — GLUCOSE, CAPILLARY
Glucose-Capillary: 99 mg/dL (ref 70–99)
Glucose-Capillary: 84 mg/dL (ref 70–99)
Glucose-Capillary: 88 mg/dL (ref 70–99)
Glucose-Capillary: 69 mg/dL — ABNORMAL LOW (ref 70–99)
Glucose-Capillary: 198 mg/dL — ABNORMAL HIGH (ref 70–99)
Glucose-Capillary: 99 mg/dL (ref 70–99)
Glucose-Capillary: 78 mg/dL (ref 70–99)

## 2019-07-01 MED ORDER — SODIUM CHLORIDE 0.9 % IV SOLN
INTRAVENOUS | Status: DC
  Administered 2019-07-01 – 2019-07-02 (×2): via INTRAVENOUS

## 2019-07-01 MED ORDER — DEXTROSE 50 % IV SOLN
INTRAVENOUS | Status: AC
  Administered 2019-07-01: 12:00:00
  Filled 2019-07-01: qty 50

## 2019-07-01 MED ORDER — DEXTROSE 50 % IV SOLN: 50.0000 mL | Freq: Once | INTRAVENOUS | Status: AC
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
## 2019-07-01 NOTE — Progress Notes (Signed)
CT Brain scan shows 8.6mm subdural.I have spoken with Neurosurgery and they have recommended observation at [facility] and  repeat CT Brain scan tomorrow. Have attempted to reach patient's daughter without success so far.Will try again later.

## 2019-07-01 NOTE — Progress Notes (Signed)
MD from Radiology contacted me to state that patient had 8 mm right subdural hematoma. MD has been notified. Patient is alert Will continue to monitor patient.

## 2019-07-01 NOTE — Progress Notes (Signed)
Subjective: Still remains somewhat confused.  According to patient's daughter, the patient had fallen and had a head injury at the end table at home prior to admission.  Renal function is back to normal and family is concerned that she is not acting her usual self.   Objective: Vital signs in last 24 hours: Temp:  [98.3 F (36.8 C)-99.1 F (37.3 C)] 98.3 F (36.8 C) (11/01 0628) Pulse Rate:  [74-89] 74 (11/01 0628) Resp:  [16-18] 16 (11/01 0628) BP: (108-130)/(60-75) 130/65 (11/01 0628) SpO2:  [100 %] 100 % (11/01 0628) Weight:  [57.5 kg] 57.5 kg (11/01 [id number]) Patient is by herself in the exam room without any family members this morning.  I cannot find any swelling/bump on the patient's scalp this morning but she does appear to be somewhat delirious/confused.  I do not find any focal neurological signs.  She is hemodynamically stable. Intake/Output from previous day: 10/31 0701 - 11/01 0700 In: 600 [P.O.:600] Out: 1800 [Urine:1800] Intake/Output this shift: No intake/output data recorded.  No results for input(s): HGB in the last 72 hours. No results for input(s): WBC, RBC, HCT, PLT in the last 72 hours. Recent Labs    06/29/19 0625 07/01/19 0657  NA 139 140  K 3.5 4.0  CL 109 107  CO2 21* 23  BUN 15 13  CREATININE 1.25* 1.00  GLUCOSE 150* 101*  CALCIUM 9.2 9.8     Assessment/Plan: 1.  Altered mental status.  It was felt that this may be related to metabolic abnormalities with acute renal failure and this is now normalized.  I will order CT scan of the brain this morning to make sure there are no other major issues from  head injury at home. 2.  Acute kidney injury.  Creatinine now normal.  Continue with baseline IV fluids as the patient is not really drinking much fluids orally. 3.  Generalized weakness/deconditioning with unsteady gait.  Await skilled nursing facility disposition.   Yoskar Murrillo C Nandan Willems 07/01/2019, 9:40 AM

## 2019-07-01 NOTE — Progress Notes (Signed)
Patient's CBG 69. Patient has poor po intake and Dextrose 50 % 59mL administered to patient per protocol. Patient is alert and oriented to self and place. Patient denies any distress or discomfort at this time. Patient does express tenderness to right side of her head. Swelling observed at site of tenderness. Vital signs are as follows     07/01/19 1130  Vitals  Temp 98.1 F (36.7 C)  Temp Source Oral  BP 116/71  BP Location Right Arm  BP Method Automatic  Patient Position (if appropriate) Lying  Pulse Rate 82  Pulse Rate Source Monitor  Resp 16  Level of Consciousness  Level of Consciousness Alert  Oxygen Therapy  SpO2 100 %  O2 Device Room Air  Pain Assessment  Pain Scale 0-10  Pain Score 0  MEWS Score  MEWS RR 0  MEWS Pulse 0  MEWS Systolic 0  MEWS LOC 0  MEWS Temp 0  MEWS Score 0  MEWS Score Color Green   Will recheck CBGs and continue to monitor patient.

## 2019-07-01 NOTE — Progress Notes (Signed)
Pat CBG now 198.

## 2019-07-02 ENCOUNTER — Inpatient Hospital Stay (HOSPITAL_COMMUNITY): Payer: Medicare Other

## 2019-07-02 DIAGNOSIS — S065X9A Traumatic subdural hemorrhage with loss of consciousness of unspecified duration, initial encounter: Secondary | ICD-10-CM | POA: Diagnosis present

## 2019-07-02 DIAGNOSIS — E119 Type 2 diabetes mellitus without complications: Secondary | ICD-10-CM

## 2019-07-02 DIAGNOSIS — N179 Acute kidney failure, unspecified: Principal | ICD-10-CM

## 2019-07-02 DIAGNOSIS — S065XAA Traumatic subdural hemorrhage with loss of consciousness status unknown, initial encounter: Secondary | ICD-10-CM | POA: Diagnosis present

## 2019-07-02 LAB — GLUCOSE, CAPILLARY
Glucose-Capillary: 105 mg/dL — ABNORMAL HIGH (ref 70–99)
Glucose-Capillary: 105 mg/dL — ABNORMAL HIGH (ref 70–99)
Glucose-Capillary: 118 mg/dL — ABNORMAL HIGH (ref 70–99)
Glucose-Capillary: 119 mg/dL — ABNORMAL HIGH (ref 70–99)
Glucose-Capillary: 129 mg/dL — ABNORMAL HIGH (ref 70–99)
Glucose-Capillary: 153 mg/dL — ABNORMAL HIGH (ref 70–99)
Glucose-Capillary: 79 mg/dL (ref 70–99)

## 2019-07-02 MED ORDER — NYSTATIN 100000 UNIT/GM EX POWD
Freq: Two times a day (BID) | CUTANEOUS | Status: DC
  Administered 2019-07-02 – 2019-07-03 (×3): via TOPICAL
  Filled 2019-07-02 (×3): qty 15

## 2019-07-02 MED ORDER — AMLODIPINE BESYLATE 5 MG PO TABS
5.0000 mg | ORAL_TABLET | Freq: Every day | ORAL | Status: DC
  Administered 2019-07-03: 5 mg via ORAL
  Filled 2019-07-02: qty 1

## 2019-07-02 NOTE — Progress Notes (Addendum)
Patient Demographics:    Linda English, is a 63 y.o. female, DOB - 1955/10/20, [id number]  Admit date - 06/24/2019   Admitting Physician Jani Gravel, MD  Outpatient Primary MD for the patient is [name], MD  LOS - 6   Chief Complaint  Patient presents with   Nausea        Subjective:    Linda English today has no fevers,  No chest pain,  - More cooperative, less confusional episodes -No new complaints, oral intake is fair  Assessment  & Plan :    Principal Problem:   ARF (acute renal failure) ([place]) Active Problems:   Rt Sided Subdural hematoma, post-traumatic    Diabetes ([place])   Anxiety state   Bipolar 1 disorder ([place])   AKI (acute kidney injury) ([place])   Essential hypertension   Nausea & vomiting   07/02/2019- CT Head IMPRESSION: 1. Unchanged size of a mixed density acute/subacute right subdural hematoma measuring up to 9 mm in thickness. 2. Unchanged mild mass effect with 2 mm leftward midline shift.   Brief Summary 63 y.o. female, Bipolar, hypertension, Dm2, Parkinsons disease, h/o rectal cancer, Gerd, Gastric erosions admitted with intractable emesis, AKI and electrolyte derangement-resolved -Patient with occasional episodes of confusion -Subsequently found to have stable presumed from acute right subdural hematoma (head trauma PTA) --awaiting insurance approval to go to [facility] SNF  A/p 1)Rt Subacute traumatic subdural hemorrhage-  -Due to persistent confusional episodes-but normalization of renal electrolyte derangement, and due to patient's daughter volunteering information about fall at home with possible head injury PTA--CT head was done on 07/01/2019 showing subdural hemorrhage, neurosurgical team advised repeat CT head on 07/02/2019 -I reviewed the report of the CT head from 07/02/2019 with neurosurgical nurse practitioner Joelene Millin--- she advised no  intervention at this time, patient may follow-up in about a week post discharge from the hospital with neurosurgery as outpatient  2)AKI----acute kidney injury due to intractable emesis (dehydration and poor oral intake) creatinine on admission=2.77  ,   baseline creatinine =1.1    , creatinine is now= 1.0     , renally adjust medications, avoid nephrotoxic agents/dehydration/hypotension - AKI resolved with IV fluids -Continue to encourage adequate oral intake  3)Generalized weakness and deconditioning --very unsteady gait, high fall risk, physical therapy eval appreciated, -Recommend SNF rehab -Awaiting insurance approval for transfer to SNF rehab  4)Parkinson's disease- tremors and gait problems persist---- continue current meds -Continue Amantadine -Patient sees Dr. Merlene Laughter  5)HTN-stable, blood pressure soft, continue metoprolol 25 mg twice daily, decrease amlodipine to 5 mg daily  6) intractable emesis /FEN- hypokalemia resolved with replacement  --No further emesis,  C/n  carb modified    Disposition/Need for in-Hospital Stay- patient unable to be discharged at this time due to --- unsteady gait, high fall risk, unsafe discharge plan to home (fell at home with subdural hemorrhage PTA) --awaiting insurance approval to go to [place] SNF  Code Status : Full code  Family Communication:   (patient is alert, awake and coherent) Discussed with Daughter- Ms Ellan [id number]  Disposition Plan  : -awaiting insurance approval to go to [facility]  Consults  : Phone consult with neurosurgical associates  DVT Prophylaxis  :  SCD (subdural hemorrhage)  Lab  Results  Component Value Date   PLT 263 06/28/2019    Inpatient Medications  Scheduled Meds:  amantadine  100 mg Oral BID   [START ON 07/03/2019] amLODipine  5 mg Oral Daily   atorvastatin  10 mg Oral q1800   feeding supplement  1 Container Oral TID BM   insulin aspart  0-9 Units Subcutaneous Q4H    lamoTRIgine  200 mg Oral BID   levETIRAcetam  1,500 mg Oral QHS   metoprolol tartrate  25 mg Oral BID   nystatin   Topical BID   pantoprazole  40 mg Oral BID   traZODone  100 mg Oral QHS   Continuous Infusions:  sodium chloride 50 mL/hr at 07/01/19 1539   PRN Meds:.ALPRAZolam  Anti-infectives (From admission, onward)   None       Objective:   Vitals:   07/01/19 2011 07/02/19 0412 07/02/19 0500 07/02/19 0952  BP: 122/71 130/74  (!) 109/52  Pulse: 92 74  76  Resp: 17 18  18   Temp: 99.7 F (37.6 C) 98.9 F (37.2 C)    TempSrc:      SpO2: 100% 100%  100%  Weight:  57.2 kg 57.5 kg   Height:        Wt Readings from Last 3 Encounters:  07/02/19 57.5 kg  03/30/18 75.1 kg  12/28/17 72.9 kg     Intake/Output Summary (Last 24 hours) at 07/02/2019 1207 Last data filed at 07/02/2019 0500 Gross per 24 hour  Intake 807.2 ml  Output 1200 ml  Net -392.8 ml    Physical Exam Gen:- Awake Alert,  In no apparent distress  HEENT:- [place].AT, No sclera icterus Neck-Supple Neck,No JVD,.  Lungs-  CTAB , fair symmetrical air movement CV- S1, S2 normal, regular  Abd-  +ve B.Sounds, Abd Soft, No tenderness,    Extremity/Skin:- No  edema, pedal pulses present  Psych-affect is appropriate, oriented x3 (continues to have occasional confusional episodes) Neuro-generalized weakness, no new focal deficits, +ve  Parkinson's tremors   Data Review:   Micro Results Recent Results (from the past 240 hour(s))  SARS CORONAVIRUS 2 (TAT 6-24 HRS) Nasopharyngeal Nasopharyngeal Swab     Status: None   Collection Time: 06/24/19  8:51 PM   Specimen: Nasopharyngeal Swab  Result Value Ref Range Status   SARS Coronavirus 2 NEGATIVE NEGATIVE Final    Comment: (NOTE) SARS-CoV-2 target nucleic acids are NOT DETECTED. The SARS-CoV-2 RNA is generally detectable in upper and lower respiratory specimens during the acute phase of infection. Negative results do not preclude SARS-CoV-2 infection, do not  rule out co-infections with other pathogens, and should not be used as the sole basis for treatment or other patient management decisions. Negative results must be combined with clinical observations, patient history, and epidemiological information. The expected result is Negative. Fact Sheet for Patients: SugarRoll.be Fact Sheet for Healthcare Providers: https://www.woods-mathews.com/ This test is not yet approved or cleared by the [place] FDA and  has been authorized for detection and/or diagnosis of SARS-CoV-2 by FDA under an Emergency Use Authorization (EUA). This EUA will remain  in effect (meaning this test can be used) for the duration of the COVID-19 declaration under Section 56 4(b)(1) of the Act, 21 U.S.C. section 360bbb-3(b)(1), unless the authorization is terminated or revoked sooner. Performed at [facility], [place] [address] [place], [place] [address]     Radiology Reports Ct Abdomen Pelvis Wo Contrast  Result Date: 06/24/2019 CLINICAL DATA:  Nausea and vomiting with  weight loss and abdominal pain, initial encounter EXAM: CT ABDOMEN AND PELVIS WITHOUT CONTRAST TECHNIQUE: Multidetector CT imaging of the abdomen and pelvis was performed following the standard protocol without IV contrast. COMPARISON:  10/19/2013 FINDINGS: Lower chest: No acute abnormality. Hepatobiliary: No focal liver abnormality is seen. Status post cholecystectomy. No biliary dilatation. Pancreas: Unremarkable. No pancreatic ductal dilatation or surrounding inflammatory changes. Spleen: Normal in size without focal abnormality. Adrenals/Urinary Tract: Adrenal glands are within normal limits. Kidneys are well visualized bilaterally. No renal calculi or obstructive changes are seen. The bladder is partially distended. Stomach/Bowel: Postsurgical changes are noted consistent with lower anterior colonic resection. No obstructive changes are seen. The  anastomosis is widely patent. The appendix has been surgically removed. No small bowel or gastric abnormality is seen. Vascular/Lymphatic: Aortic atherosclerosis. No enlarged abdominal or pelvic lymph nodes. Reproductive: Status post hysterectomy. No adnexal masses. Other: No abdominal wall hernia or abnormality. No abdominopelvic ascites. Musculoskeletal: Degenerative changes of lumbar spine are noted. IMPRESSION: No acute abnormality to correspond with patient's given clinical history is noted. Electronically Signed   By: Inez Catalina M.D.   On: 06/24/2019 20:38   Ct Head Wo Contrast  Result Date: 07/02/2019 CLINICAL DATA:  Subdural hemorrhage, follow-up. EXAM: CT HEAD WITHOUT CONTRAST TECHNIQUE: Contiguous axial images were obtained from the base of the skull through the vertex without intravenous contrast. COMPARISON:  Head CT 07/01/2019 FINDINGS: Brain: No significant interval change in size of an acute/subacute mixed density right subdural hematoma overlying the right frontoparietal convexity, again measuring up to 9 mm in greatest thickness. As before, there is mild mass effect upon the underlying right cerebral hemisphere. Unchanged 2 mm leftward midline shift at the level of the septum pellucidum. No demarcated cortical infarction. No intracranial mass. Stable mild generalized parenchymal atrophy. Vascular: No hyperdense vessel Skull: No calvarial fracture Sinuses/Orbits: Visualized orbits demonstrate no acute abnormality. No significant paranasal sinus disease or mastoid effusion at the imaged levels IMPRESSION: 1. Unchanged size of a mixed density acute/subacute right subdural hematoma measuring up to 9 mm in thickness. 2. Unchanged mild mass effect with 2 mm leftward midline shift. 3. Stable generalized parenchymal atrophy. Electronically Signed   By: Kellie Simmering DO   On: 07/02/2019 07:46   Ct Head Wo Contrast  Result Date: 07/01/2019 CLINICAL DATA:  Fall at home. Altered mental status.  Resolution of scalp hematoma with persistent altered mental status and confusion. EXAM: CT HEAD WITHOUT CONTRAST TECHNIQUE: Contiguous axial images were obtained from the base of the skull through the vertex without intravenous contrast. COMPARISON:  MR head without contrast 11/09/2018 FINDINGS: Brain: A mixed density extra-axial hemorrhage is present over the right frontal parietal convexity. Maximal coronal measurement is 8.5 mm. There is 1 mm right to left midline shift. There is partial effacement of the subjacent sulci. No definite subarachnoid hemorrhage is present. There is no intraventricular hemorrhage. No acute or focal cortical abnormality is present. There is no parenchymal enhancement. Basal ganglia and insular ribbon are normal. The brainstem and cerebellum are normal. Vascular: No hyperdense vessel or unexpected calcification. Skull: Calvarium is intact. No focal lytic or blastic lesions are present. Sinuses/Orbits: The paranasal sinuses and mastoid air cells are clear. The globes and orbits are within normal limits. IMPRESSION: 1. Acute/subacute mixed density right subdural hematoma measuring up to 8.5 mm on coronal imaging. 2. Focal mass effect with minimal midline shift. 3. Moderate atrophy, predominantly within the temporal lobe is stable. These results were called by telephone at the time of  interpretation on 07/01/2019 at 10:54 am to the patient's nurse, Lattie Haw. Who verbally acknowledged these results. Electronically Signed   By: San Morelle M.D.   On: 07/01/2019 10:56    CBC Recent Labs  Lab 06/28/19 0347  WBC 4.1  HGB 9.8*  HCT 31.4*  PLT 263  MCV 86.7  MCH 27.1  MCHC 31.2  RDW 13.7    Chemistries  Recent Labs  Lab 06/26/19 0610 06/28/19 0347 06/29/19 0625 07/01/19 0657  NA 140 142 139 140  K 4.1 3.9 3.5 4.0  CL 112* 109 109 107  CO2 22 21* 21* 23  GLUCOSE 114* 91 150* 101*  BUN 10 9 15 13   CREATININE 1.12* 1.30* 1.25* 1.00  CALCIUM 9.3 9.5 9.2 9.8  MG  --   1.6*  --   --    ------------------------------------------------------------------------------------------------------------------ No results for input(s): CHOL, HDL, LDLCALC, TRIG, CHOLHDL, LDLDIRECT in the last 72 hours.  Lab Results  Component Value Date   HGBA1C 6.1 (H) 08/10/2015   ------------------------------------------------------------------------------------------------------------------ No results for input(s): TSH, T4TOTAL, T3FREE, THYROIDAB in the last 72 hours.  Invalid input(s): FREET3 ------------------------------------------------------------------------------------------------------------------ No results for input(s): VITAMINB12, FOLATE, FERRITIN, TIBC, IRON, RETICCTPCT in the last 72 hours.  Coagulation profile No results for input(s): INR, PROTIME in the last 168 hours.  No results for input(s): DDIMER in the last 72 hours.  Cardiac Enzymes No results for input(s): CKMB, TROPONINI, MYOGLOBIN in the last 168 hours.  Invalid input(s): CK ------------------------------------------------------------------------------------------------------------------ No results found for: BNP   Roxan Hockey M.D on 07/02/2019 at 12:07 PM  Go to [email or web address] - for contact info  [facility] - Office  [phone]

## 2019-07-02 NOTE — Care Management Important Message (Signed)
Important Message  Patient Details  Name: Linda English MRN: [id number] Date of Birth: [birth year]   Medicare Important Message Given:  Yes     Tommy Medal 07/02/2019, 1:14 PM

## 2019-07-02 NOTE — TOC Progression Note (Signed)
Transition of Care [facility]) - Progression Note    Patient Details  Name: DEVI KUJAWA MRN: [id number] Date of Birth: [birth year]  [facility]) CM/SW Contact  Ihor Gully, LCSW Phone Number: 07/02/2019, 3:34 PM  Clinical Narrative:    Fortunato Curling started auth today. Patient remains agreeable to placement. PASRR number remains pending.    Expected Discharge Plan: [place] Barriers to Discharge: Continued Medical Work up  Expected Discharge Plan and Services Expected Discharge Plan: [place] arrangements for the past 2 months: Apartment                                       Social Determinants of Health (SDOH) Interventions    Readmission Risk Interventions No flowsheet data found.

## 2019-07-02 NOTE — Progress Notes (Signed)
Physical Therapy Treatment Patient Details Name: Linda English MRN: [id number] DOB: 03-14-1956 Today's Date: 07/02/2019    History of Present Illness Dawnyel Meckstroth  is a 63 y.o. female, Bipolar, hypertension, Dm2, Parkinsons disease, h/o rectal cancer, Gerd, Gastric erosions, presents with intractable n/v,  For the past 2 months .  At least 2 x per day.  Pt notes taking ibuprofen at times.  Pt noted diarrhea about 3 days ago.  Pt denies fever, chills, abd pain, brbpr,    PT Comments    Patient presents alert and appears more orientated, less tremors in extremities, demonstrates increased endurance/distance for gait training without loss of balance and limited secondary to c/o fatigue.  Patient tolerated sitting up in chair after therapy - RN aware.  Patient will benefit from continued physical therapy in hospital and recommended venue below to increase strength, balance, endurance for safe ADLs and gait.    Follow Up Recommendations  SNF;Supervision for mobility/OOB;Supervision/Assistance - 24 hour     Equipment Recommendations  None recommended by PT    Recommendations for Other Services       Precautions / Restrictions Precautions Precautions: Fall Restrictions Weight Bearing Restrictions: No    Mobility  Bed Mobility Overal bed mobility: Needs Assistance Bed Mobility: Supine to Sit     Supine to sit: Min assist     General bed mobility comments: increased time, labored movement  Transfers Overall transfer level: Needs assistance Equipment used: Rolling walker (2 wheeled) Transfers: Sit to/from [facility] Sit to Stand: Min assist Stand pivot transfers: Min assist       General transfer comment: less shaky and requires less assistance  Ambulation/Gait Ambulation/Gait assistance: Min assist;Mod assist Gait Distance (Feet): 30 Feet Assistive device: Rolling walker (2 wheeled) Gait Pattern/deviations: Decreased step length -  right;Decreased step length - left;Decreased stride length Gait velocity: decreased   General Gait Details: increased endurance/distance for ambulation with unsteady cadence, less tremors and less assistance required   Stairs             Wheelchair Mobility    Modified Rankin (Stroke Patients Only)       Balance Overall balance assessment: Needs assistance Sitting-balance support: Feet supported;No upper extremity supported Sitting balance-Leahy Scale: Fair Sitting balance - Comments: fair/good seated at bedside   Standing balance support: During functional activity;Bilateral upper extremity supported Standing balance-Leahy Scale: Poor Standing balance comment: fair/poor using RW                            Cognition Arousal/Alertness: Awake/alert Behavior During Therapy: WFL for tasks assessed/performed Overall Cognitive Status: Within Functional Limits for tasks assessed                                        Exercises General Exercises - Lower Extremity Long Arc Quad: Seated;AROM;Strengthening;Both;10 reps Hip Flexion/Marching: Seated;AROM;Strengthening;Both;10 reps Toe Raises: Seated;AROM;Strengthening;Both;10 reps Heel Raises: Seated;AROM;Strengthening;Both;10 reps    General Comments        Pertinent Vitals/Pain Pain Assessment: No/denies pain    Home Living                      Prior Function            PT Goals (current goals can now be found in the care plan section) Acute Rehab PT Goals Patient Stated Goal: return  home with family to assist PT Goal Formulation: With patient Time For Goal Achievement: 07/10/19 Potential to Achieve Goals: Good Progress towards PT goals: Progressing toward goals    Frequency    Min 3X/week      PT Plan Current plan remains appropriate    Co-evaluation              AM-PAC PT "6 Clicks" Mobility   Outcome Measure  Help needed turning from your back to your  side while in a flat bed without using bedrails?: A Little Help needed moving from lying on your back to sitting on the side of a flat bed without using bedrails?: A Lot Help needed moving to and from a bed to a chair (including a wheelchair)?: A Lot Help needed standing up from a chair using your arms (e.g., wheelchair or bedside chair)?: A Lot Help needed to walk in hospital room?: A Lot Help needed climbing 3-5 steps with a railing? : A Lot 6 Click Score: 13    End of Session Equipment Utilized During Treatment: Gait belt Activity Tolerance: Patient tolerated treatment well;Patient limited by fatigue Patient left: in chair;with call bell/phone within reach;with chair alarm set Nurse Communication: Mobility status PT Visit Diagnosis: Unsteadiness on feet (R26.81);Other abnormalities of gait and mobility (R26.89);Muscle weakness (generalized) (M62.81)     Time: [id number] PT Time Calculation (min) (ACUTE ONLY): 27 min  Charges:  $Gait Training: 8-22 mins $Therapeutic Exercise: 8-22 mins                     4:21 PM, 07/02/19 Lonell Grandchild, MPT Physical Therapist with [facility] [phone] office [phone] mobile phone

## 2019-07-02 NOTE — Progress Notes (Signed)
Patient had uneventful night w/o complaints of pain.  Patient did have two episodes of seeing "boys' in her room and a "fat man, that would not leave her alone'.  Patient was alert & oriented x4 with both episodes. Patient had no complaints of N/V.

## 2019-07-03 DIAGNOSIS — R7989 Other specified abnormal findings of blood chemistry: Secondary | ICD-10-CM | POA: Diagnosis not present

## 2019-07-03 DIAGNOSIS — E878 Other disorders of electrolyte and fluid balance, not elsewhere classified: Secondary | ICD-10-CM | POA: Diagnosis not present

## 2019-07-03 DIAGNOSIS — S065X0D Traumatic subdural hemorrhage without loss of consciousness, subsequent encounter: Secondary | ICD-10-CM | POA: Diagnosis not present

## 2019-07-03 DIAGNOSIS — F250 Schizoaffective disorder, bipolar type: Secondary | ICD-10-CM | POA: Insufficient documentation

## 2019-07-03 DIAGNOSIS — N179 Acute kidney failure, unspecified: Secondary | ICD-10-CM | POA: Diagnosis not present

## 2019-07-03 DIAGNOSIS — Z23 Encounter for immunization: Secondary | ICD-10-CM | POA: Diagnosis not present

## 2019-07-03 DIAGNOSIS — E860 Dehydration: Secondary | ICD-10-CM | POA: Diagnosis not present

## 2019-07-03 DIAGNOSIS — R4182 Altered mental status, unspecified: Secondary | ICD-10-CM | POA: Diagnosis present

## 2019-07-03 DIAGNOSIS — M6281 Muscle weakness (generalized): Secondary | ICD-10-CM | POA: Diagnosis not present

## 2019-07-03 DIAGNOSIS — I10 Essential (primary) hypertension: Secondary | ICD-10-CM | POA: Diagnosis not present

## 2019-07-03 DIAGNOSIS — S065X0A Traumatic subdural hemorrhage without loss of consciousness, initial encounter: Secondary | ICD-10-CM | POA: Diagnosis not present

## 2019-07-03 DIAGNOSIS — Z7401 Bed confinement status: Secondary | ICD-10-CM | POA: Diagnosis not present

## 2019-07-03 DIAGNOSIS — R404 Transient alteration of awareness: Secondary | ICD-10-CM | POA: Diagnosis not present

## 2019-07-03 DIAGNOSIS — W19XXXA Unspecified fall, initial encounter: Secondary | ICD-10-CM | POA: Diagnosis not present

## 2019-07-03 DIAGNOSIS — R29898 Other symptoms and signs involving the musculoskeletal system: Secondary | ICD-10-CM | POA: Diagnosis not present

## 2019-07-03 DIAGNOSIS — R112 Nausea with vomiting, unspecified: Secondary | ICD-10-CM | POA: Diagnosis not present

## 2019-07-03 DIAGNOSIS — R319 Hematuria, unspecified: Secondary | ICD-10-CM | POA: Diagnosis not present

## 2019-07-03 DIAGNOSIS — G20 Parkinson's disease: Secondary | ICD-10-CM | POA: Diagnosis not present

## 2019-07-03 DIAGNOSIS — R2689 Other abnormalities of gait and mobility: Secondary | ICD-10-CM | POA: Diagnosis not present

## 2019-07-03 DIAGNOSIS — D649 Anemia, unspecified: Secondary | ICD-10-CM | POA: Diagnosis not present

## 2019-07-03 DIAGNOSIS — Z743 Need for continuous supervision: Secondary | ICD-10-CM | POA: Diagnosis not present

## 2019-07-03 DIAGNOSIS — R5381 Other malaise: Secondary | ICD-10-CM | POA: Diagnosis not present

## 2019-07-03 DIAGNOSIS — I69128 Other speech and language deficits following nontraumatic intracerebral hemorrhage: Secondary | ICD-10-CM | POA: Diagnosis not present

## 2019-07-03 DIAGNOSIS — E119 Type 2 diabetes mellitus without complications: Secondary | ICD-10-CM | POA: Diagnosis not present

## 2019-07-03 DIAGNOSIS — Z7984 Long term (current) use of oral hypoglycemic drugs: Secondary | ICD-10-CM | POA: Diagnosis not present

## 2019-07-03 DIAGNOSIS — M158 Other polyosteoarthritis: Secondary | ICD-10-CM | POA: Diagnosis not present

## 2019-07-03 DIAGNOSIS — Z79899 Other long term (current) drug therapy: Secondary | ICD-10-CM | POA: Diagnosis not present

## 2019-07-03 DIAGNOSIS — N390 Urinary tract infection, site not specified: Secondary | ICD-10-CM | POA: Diagnosis not present

## 2019-07-03 DIAGNOSIS — M4722 Other spondylosis with radiculopathy, cervical region: Secondary | ICD-10-CM | POA: Diagnosis not present

## 2019-07-03 DIAGNOSIS — F419 Anxiety disorder, unspecified: Secondary | ICD-10-CM | POA: Insufficient documentation

## 2019-07-03 DIAGNOSIS — R410 Disorientation, unspecified: Secondary | ICD-10-CM | POA: Diagnosis not present

## 2019-07-03 DIAGNOSIS — I959 Hypotension, unspecified: Secondary | ICD-10-CM | POA: Diagnosis not present

## 2019-07-03 DIAGNOSIS — Y92009 Unspecified place in unspecified non-institutional (private) residence as the place of occurrence of the external cause: Secondary | ICD-10-CM | POA: Diagnosis not present

## 2019-07-03 LAB — CBC
HCT: 31.3 % — ABNORMAL LOW (ref 36.0–46.0)
Hemoglobin: 9.7 g/dL — ABNORMAL LOW (ref 12.0–15.0)
MCH: 27.3 pg (ref 26.0–34.0)
MCHC: 31 g/dL (ref 30.0–36.0)
MCV: 88.2 fL (ref 80.0–100.0)
Platelets: 296 10*3/uL (ref 150–400)
RBC: 3.55 MIL/uL — ABNORMAL LOW (ref 3.87–5.11)
RDW: 14 % (ref 11.5–15.5)
WBC: 4.3 10*3/uL (ref 4.0–10.5)
nRBC: 0 % (ref 0.0–0.2)

## 2019-07-03 LAB — RENAL FUNCTION PANEL
Albumin: 3.4 g/dL — ABNORMAL LOW (ref 3.5–5.0)
Anion gap: 7 (ref 5–15)
BUN: 18 mg/dL (ref 8–23)
CO2: 23 mmol/L (ref 22–32)
Calcium: 9.4 mg/dL (ref 8.9–10.3)
Chloride: 110 mmol/L (ref 98–111)
Creatinine, Ser: 1.09 mg/dL — ABNORMAL HIGH (ref 0.44–1.00)
GFR calc Af Amer: >60 mL/min (ref >60)
GFR calc non Af Amer: 55 mL/min — ABNORMAL LOW (ref >60)
Glucose, Bld: 114 mg/dL — ABNORMAL HIGH (ref 70–99)
Phosphorus: 3.1 mg/dL (ref 2.5–4.6)
Potassium: 3.5 mmol/L (ref 3.5–5.1)
Sodium: 140 mmol/L (ref 135–145)

## 2019-07-03 LAB — GLUCOSE, CAPILLARY
Glucose-Capillary: 118 mg/dL — ABNORMAL HIGH (ref 70–99)
Glucose-Capillary: 101 mg/dL — ABNORMAL HIGH (ref 70–99)
Glucose-Capillary: 114 mg/dL — ABNORMAL HIGH (ref 70–99)
Glucose-Capillary: 128 mg/dL — ABNORMAL HIGH (ref 70–99)

## 2019-07-03 MED ORDER — POTASSIUM CHLORIDE CRYS ER 20 MEQ PO TBCR: 40.0000 meq | EXTENDED_RELEASE_TABLET | Freq: Once | ORAL | Status: DC

## 2019-07-03 MED ORDER — TRAZODONE HCL 100 MG PO TABS: 100.0000 mg | ORAL_TABLET | Freq: Every day | ORAL | 5 refills | Status: DC

## 2019-07-03 MED ORDER — INSULIN ASPART 100 UNIT/ML ~~LOC~~ SOLN: SUBCUTANEOUS | 3 refills | Status: DC

## 2019-07-03 MED ORDER — ALPRAZOLAM 0.5 MG PO TABS: 0.5000 mg | ORAL_TABLET | Freq: Three times a day (TID) | ORAL | 0 refills | Status: DC | PRN

## 2019-07-03 MED ORDER — METOPROLOL TARTRATE 25 MG PO TABS: 25.0000 mg | ORAL_TABLET | Freq: Two times a day (BID) | ORAL | 0 refills | Status: DC

## 2019-07-03 MED ORDER — AMLODIPINE BESYLATE 5 MG PO TABS: 5.0000 mg | ORAL_TABLET | Freq: Every day | ORAL | 2 refills | Status: DC

## 2019-07-03 MED ORDER — PANTOPRAZOLE SODIUM 40 MG PO TBEC: 40.0000 mg | DELAYED_RELEASE_TABLET | Freq: Every day | ORAL | 11 refills | Status: DC

## 2019-07-03 MED ORDER — NYSTATIN 100000 UNIT/GM EX POWD: Freq: Two times a day (BID) | CUTANEOUS | 0 refills | Status: DC

## 2019-07-03 NOTE — Care Plan (Signed)
Report called to [facility]. All questions and concerns answered. [facility] has been notified for transfer.

## 2019-07-03 NOTE — Discharge Instructions (Signed)
1)Follow- up with neurosurgeon Dr. Christella Noa ([facility])-- [phone] 1 week (around 07/10/2019) for recheck and reevaluation due to intracranial hemorrhage 2)Avoid ibuprofen/Advil/Aleve/Motrin/Goody Powders/Naproxen/BC powders/Meloxicam/Diclofenac/Indomethacin and other Nonsteroidal anti-inflammatory medications as these will make you more likely to bleed and can cause stomach ulcers, can also cause Kidney problems.  3)Fall Precautions

## 2019-07-03 NOTE — TOC Transition Note (Signed)
Transition of Care [facility]) - CM/SW Discharge Note   Patient Details  Name: CHADAE MARINA MRN: [id number] Date of Birth: 1955-10-23  [facility]) CM/SW Contact:  Shade Flood, LCSW Phone Number: 07/03/2019, 3:32 PM   Clinical Narrative:     Received PASRR approval and Insurance Authorization for SNF rehab at [facility]. Updated MD who plans to dc pt. Pt aware and in agreement. EMS form printed to floor. RN to call report. DC clinical will be sent electronically. Will call EMS once dc orders are in.  There are no other TOC needs for dc.  Final next level of care: Skilled Nursing Facility Barriers to Discharge: Barriers Resolved   Patient Goals and CMS Choice Patient states their goals for this hospitalization and ongoing recovery are:: Patient is unsure as to whether she desires SNF or [facility] CMS Medicare.gov Compare Post Acute Care list provided to:: Patient Choice offered to / list presented to : Patient, Adult Children  Discharge Placement PASRR number recieved: 07/03/19            Patient chooses bed at: Other - please specify in the comment section below:([facility]) Patient to be transferred to facility by: EMS Name of family member notified: Montrese (h[place]a complian voicemail message) Patient and family notified of of transfer: 07/03/19  Discharge Plan and Services                                     Social Determinants of Health (SDOH) Interventions     Readmission Risk Interventions Readmission Risk Prevention Plan 07/03/2019  Transportation Screening Complete  PCP or Specialist Appt within 5-7 Days Not Complete  Not Complete comments SNF MD will follow  Home Care Screening Not Complete  Home Care Screening Not Completed Comments Pt going to SNF  Medication Review (RN CM) Complete  Some recent data might be hidden

## 2019-07-03 NOTE — Discharge Summary (Signed)
Linda English, is a 63 y.o. female  DOB [birth year]  MRN [id number].  Admission date:  06/24/2019  Admitting Physician  Jani Gravel, MD  Discharge Date:  07/03/2019   Primary MD  [name], MD  Recommendations for primary care physician for things to follow:   1)Follow- up with neurosurgeon Dr. Christella Noa ([facility])-- [phone] 1 week (around 07/10/2019) for recheck and reevaluation due to intracranial hemorrhage 2)Avoid ibuprofen/Advil/Aleve/Motrin/Goody Powders/Naproxen/BC powders/Meloxicam/Diclofenac/Indomethacin and other Nonsteroidal anti-inflammatory medications as these will make you more likely to bleed and can cause stomach ulcers, can also cause Kidney problems.  3)Fall Precautions  Admission Diagnosis  Dehydration [E86.0] Hypokalemia [E87.6] Acute kidney injury ([place]) [N17.9] Non-intractable vomiting with nausea, unspecified vomiting type [R11.2] ARF (acute renal failure) (HCC) [N17.9]   Discharge Diagnosis  Dehydration [E86.0] Hypokalemia [E87.6] Acute kidney injury ([place]) [N17.9] Non-intractable vomiting with nausea, unspecified vomiting type [R11.2] ARF (acute renal failure) ([place]) [N17.9]    Principal Problem:   ARF (acute renal failure) ([place]) Active Problems:   Rt Sided Subdural hematoma, post-traumatic    Diabetes ([place])   Anxiety state   Bipolar 1 disorder (HCC)   AKI (acute kidney injury) ([place])   Essential hypertension   Nausea & vomiting      Past Medical History:  Diagnosis Date   Acid reflux    Allergic rhinitis    Anxiety    Arthritis    osteoarthritis   Bipolar disorder (HCC)    Chronic headache    Depression    Diverticula of colon    DM (diabetes mellitus) (HCC)    Gastric erosions    Gastric polyps    benign    Hemorrhoids    Hiatal hernia    Hypertension    IBS (irritable bowel syndrome)    Lichen planus     Obstructive sleep apnea    Panic disorder    Parkinson's disease ([place])    Rectal cancer ([place]) 2003   ileostomy and reversal   Tubular adenoma     Past Surgical History:  Procedure Laterality Date   ABDOMINAL HYSTERECTOMY     APPENDECTOMY     BACK SURGERY     CESAREAN SECTION     X  2   CHOLECYSTECTOMY     COLONOSCOPY  08/2007   DR Gala Romney, friable anal canal hemorrhoids, surgical anastomosis at 3cm, distal scattered tics   COLONOSCOPY  12/15/2010   anal papilla and internal hemorrhoids/diminutive polyp in the base of the cecum. Past, tubular adenoma.. Next colonoscopy due in April 2017.   COLONOSCOPY  10/27/2005   RMR: Anal canal hemorrhoids. Surgical anastomosis at 3 cm from the anal verge appeared normal. Few scattered distal diverticula. The residual  colonic mucosa appeared normal. I suspect the patient bled from hemorrhoids   COLONOSCOPY N/A 01/15/2015   [id number] residual rectum and colon. next tcs 12/2019   ESOPHAGEAL DILATION N/A 06/27/2015   Procedure: ESOPHAGEAL DILATION;  Surgeon: Daneil Dolin, MD;  [facility];  Service: Endoscopy;  Laterality: N/A;  ESOPHAGOGASTRODUODENOSCOPY  05/2002   DR Gala Romney, normal   ESOPHAGOGASTRODUODENOSCOPY  08/03/2011   RMR: small HH/ gastric polyps   ESOPHAGOGASTRODUODENOSCOPY N/A 06/27/2015   Dr. Gala Romney: Mild erosive reflux esophagitits. Status post passage of a Maloney dilator. Hiatal hernia. Gastric polyps and abnormal gastric mucosa of doubtful clinical significane. status post biopsy, benign fundic gland polyp, negative H.pylori   low anterior rection  2003   RECTAL CANCER   SPINE SURGERY  2001   L5,S1 HEMILAMINOTOMY AND DISCECTOMY/DR DEATON       HPI  from the history and physical done on the day of admission:    - Linda English  is a 63 y.o. female, Bipolar, hypertension, Dm2, Parkinsons disease, h/o rectal cancer, Gerd, Gastric erosions, presents with intractable n/v,  For the past 2 months .   At least 2 x per day.  Pt notes taking ibuprofen at times.  Pt noted diarrhea about 3 days ago.  Pt denies fever, chills, abd pain, brbpr,   In ED,  T 98.3, P 111, R 18, Bp 131/72 pox 99% on RA Wt 54.4  Na 139, K 2.9, Bun 53, Creatine 2.77 Wbc 5.0, Hgb 11.5, Plt 358 Lipase 42 Trop 5 Urinalysis negative  CT abd/ pelvis IMPRESSION: No acute abnormality to correspond with patient's given clinical history is noted.   Pt given ns 2L iv x1 in the ED. reglan 10mg  iv x1  Pt will be admitted for intractable n/v     Hospital Course:    -07/02/2019- CT Head IMPRESSION: 1. Unchanged size of a mixed density acute/subacute right subdural hematoma measuring up to 9 mm in thickness. 2. Unchanged mild mass effect with 2 mm leftward midline shift.   Brief Summary 63 y.o.female,Bipolar, hypertension, Dm2, Parkinsons disease, h/o rectal cancer, Gerd, Gastric erosions admitted with intractable emesis, AKI and electrolyte derangement-resolved -Patient with occasional episodes of confusion -Subsequently found to have stable presumed from acute right subdural hematoma (head trauma PTA)   A/p 1)Rt Subacute traumatic subdural hemorrhage-  -Due to persistent confusional episodes- even after normalization of renal electrolyte derangement, and due to patient's daughter volunteering information about fall at home with possible head injury PTA--CT head was done on 07/01/2019 showing subdural hemorrhage, neurosurgical team advised repeat CT head on 07/02/2019 -I reviewed the report of the CT head from 07/02/2019 with neurosurgical nurse practitioner Joelene Millin (Dr Christella Noa)--- she advised no intervention at this time, patient may follow-up in about a week post discharge from the hospital with neurosurgery as outpatient -Follow- up with neurosurgeon Dr. Christella Noa ([facility])-- [phone] 1 week (around 07/10/2019) for recheck and reevaluation due to intracranial  hemorrhage   2)AKI----acute kidney injury due to intractable emesis (dehydration and poor oral intake) creatinine on admission=2.77  ,   baseline creatinine =1.1    , creatinine is now= 1.0     , renally adjust medications, avoid nephrotoxic agents/dehydration/hypotension - AKI resolved with IV fluids -Continue to encourage adequate oral intake -Discontinued benazepril and chlorthalidone  3)Generalized weakness and deconditioning --very unsteady gait, high fall risk, physical therapy eval appreciated, -Recommend SNF rehab  4)Parkinson's disease- tremors and gait problems persist---- continue current meds -Continue Amantadine -Patient sees Dr. Gareth Eagle follow-up advised  5)HTN-stable, blood pressure soft, continue metoprolol 25 mg twice daily, decrease amlodipine to 5 mg daily -Discontinue benazepril and chlorthalidone  6) intractable emesis /FEN- hypokalemia resolved with replacement  --No further emesis,  -Tolerating solid food well   Disposition--[facility]  Code Status : Full code  Family Communication:   (  patient is alert, awake and coherent) Discussed with Daughter- Ms Colbie [id number]  Consults  : Phone consult with neurosurgical associates- Dr Christella Noa [facility] practitioner)  Discharge Condition: stable  Follow UP  Contact information for after-discharge care    [facility] .   Service: Skilled Nursing Contact information: [address] [place] [phone]             Diet and Activity recommendation:  As advised  Discharge Instructions    Discharge Instructions    Call MD for:  difficulty breathing, headache or visual disturbances   Complete by: As directed    Call MD for:  persistant dizziness or light-headedness   Complete by: As directed    Call MD for:  persistant nausea and vomiting   Complete by: As directed    Call MD  for:  severe uncontrolled pain   Complete by: As directed    Call MD for:  temperature >100.4   Complete by: As directed    Diet - low sodium heart healthy   Complete by: As directed    Discharge instructions   Complete by: As directed    1)Follow- up with neurosurgeon Dr. Christella Noa ([facility])-- [phone] 1 week (around 07/10/2019) for recheck and reevaluation due to intracranial hemorrhage 2)Avoid ibuprofen/Advil/Aleve/Motrin/Goody Powders/Naproxen/BC powders/Meloxicam/Diclofenac/Indomethacin and other Nonsteroidal anti-inflammatory medications as these will make you more likely to bleed and can cause stomach ulcers, can also cause Kidney problems.  3)Fall Precautions   Increase activity slowly   Complete by: As directed         Discharge Medications     Allergies as of 07/03/2019      Reactions   Shellfish Allergy Anaphylaxis   Lithium Nausea And Vomiting   Excedrin Extra Strength [aspirin-acetaminophen-caffeine] Other (See Comments)   Makes her nervous.      Medication List    STOP taking these medications   benazepril 20 MG tablet Commonly known as: LOTENSIN   chlorthalidone 25 MG tablet Commonly known as: HYGROTON   clonazePAM 1 MG tablet Commonly known as: KLONOPIN     TAKE these medications   ALPRAZolam 0.5 MG tablet Commonly known as: XANAX Take 1 tablet (0.5 mg total) by mouth 3 (three) times daily as needed for anxiety or sleep.   Amantadine HCl 100 MG tablet Take 1 tablet by mouth 2 (two) times daily.   amLODipine 5 MG tablet Commonly known as: NORVASC Take 1 tablet (5 mg total) by mouth daily. Start taking on: July 04, 2019 What changed:   medication strength  how much to take   atorvastatin 10 MG tablet Commonly known as: LIPITOR Take 10 mg by mouth 3 (three) times a week.   insulin aspart 100 UNIT/ML injection Commonly known as: NovoLOG insulin aspart (novoLOG) injection 0-9 Units 0-9 Units, Subcutaneous, 3 times  daily with meals, CBG < 70: Implement Hypoglycemia Standing Orders and refer to Hypoglycemia Standing Orders sidebar report  CBG 70 - 120: 0 units CBG 121 - 150: 1 unit CBG 151 - 200: 2 units CBG 201 - 250: 3 units CBG 251 - 300: 5 units CBG 301 - 350: 7 units CBG 351 - 400: 9 units CBG > 400:   lamoTRIgine 200 MG tablet Commonly known as: LAMICTAL Take 200 mg by mouth 2 (two) times daily.   levETIRAcetam 500 MG tablet Commonly known as: KEPPRA Take 500 mg by mouth at bedtime.   metFORMIN 500 MG  24 hr tablet Commonly known as: GLUCOPHAGE-XR Take 500 mg by mouth 2 (two) times daily.   metoprolol tartrate 25 MG tablet Commonly known as: LOPRESSOR Take 1 tablet (25 mg total) by mouth 2 (two) times daily.   nystatin powder Commonly known as: MYCOSTATIN/NYSTOP Apply topically 2 (two) times daily.   ondansetron 4 MG disintegrating tablet Commonly known as: Zofran ODT Take 1 tablet (4 mg total) by mouth every 8 (eight) hours as needed for nausea or vomiting.   pantoprazole 40 MG tablet Commonly known as: PROTONIX Take 1 tablet (40 mg total) by mouth daily. What changed: when to take this   traZODone 100 MG tablet Commonly known as: DESYREL Take 1 tablet (100 mg total) by mouth at bedtime.   Vitamin D3 1.25 MG (50000 UT) Caps Take 1 tablet by mouth once a week.       Major procedures and Radiology Reports - PLEASE review detailed and final reports for all details, in brief -   Ct Abdomen Pelvis Wo Contrast  Result Date: 06/24/2019 CLINICAL DATA:  Nausea and vomiting with weight loss and abdominal pain, initial encounter EXAM: CT ABDOMEN AND PELVIS WITHOUT CONTRAST TECHNIQUE: Multidetector CT imaging of the abdomen and pelvis was performed following the standard protocol without IV contrast. COMPARISON:  10/19/2013 FINDINGS: Lower chest: No acute abnormality. Hepatobiliary: No focal liver abnormality is seen. Status post cholecystectomy. No biliary dilatation. Pancreas:  Unremarkable. No pancreatic ductal dilatation or surrounding inflammatory changes. Spleen: Normal in size without focal abnormality. Adrenals/Urinary Tract: Adrenal glands are within normal limits. Kidneys are well visualized bilaterally. No renal calculi or obstructive changes are seen. The bladder is partially distended. Stomach/Bowel: Postsurgical changes are noted consistent with lower anterior colonic resection. No obstructive changes are seen. The anastomosis is widely patent. The appendix has been surgically removed. No small bowel or gastric abnormality is seen. Vascular/Lymphatic: Aortic atherosclerosis. No enlarged abdominal or pelvic lymph nodes. Reproductive: Status post hysterectomy. No adnexal masses. Other: No abdominal wall hernia or abnormality. No abdominopelvic ascites. Musculoskeletal: Degenerative changes of lumbar spine are noted. IMPRESSION: No acute abnormality to correspond with patient's given clinical history is noted. Electronically Signed   By: Inez Catalina M.D.   On: 06/24/2019 20:38   Ct Head Wo Contrast  Result Date: 07/02/2019 CLINICAL DATA:  Subdural hemorrhage, follow-up. EXAM: CT HEAD WITHOUT CONTRAST TECHNIQUE: Contiguous axial images were obtained from the base of the skull through the vertex without intravenous contrast. COMPARISON:  Head CT 07/01/2019 FINDINGS: Brain: No significant interval change in size of an acute/subacute mixed density right subdural hematoma overlying the right frontoparietal convexity, again measuring up to 9 mm in greatest thickness. As before, there is mild mass effect upon the underlying right cerebral hemisphere. Unchanged 2 mm leftward midline shift at the level of the septum pellucidum. No demarcated cortical infarction. No intracranial mass. Stable mild generalized parenchymal atrophy. Vascular: No hyperdense vessel Skull: No calvarial fracture Sinuses/Orbits: Visualized orbits demonstrate no acute abnormality. No significant paranasal sinus  disease or mastoid effusion at the imaged levels IMPRESSION: 1. Unchanged size of a mixed density acute/subacute right subdural hematoma measuring up to 9 mm in thickness. 2. Unchanged mild mass effect with 2 mm leftward midline shift. 3. Stable generalized parenchymal atrophy. Electronically Signed   By: Kellie Simmering DO   On: 07/02/2019 07:46   Ct Head Wo Contrast  Result Date: 07/01/2019 CLINICAL DATA:  Fall at home. Altered mental status. Resolution of scalp hematoma with persistent altered mental status and  confusion. EXAM: CT HEAD WITHOUT CONTRAST TECHNIQUE: Contiguous axial images were obtained from the base of the skull through the vertex without intravenous contrast. COMPARISON:  MR head without contrast 11/09/2018 FINDINGS: Brain: A mixed density extra-axial hemorrhage is present over the right frontal parietal convexity. Maximal coronal measurement is 8.5 mm. There is 1 mm right to left midline shift. There is partial effacement of the subjacent sulci. No definite subarachnoid hemorrhage is present. There is no intraventricular hemorrhage. No acute or focal cortical abnormality is present. There is no parenchymal enhancement. Basal ganglia and insular ribbon are normal. The brainstem and cerebellum are normal. Vascular: No hyperdense vessel or unexpected calcification. Skull: Calvarium is intact. No focal lytic or blastic lesions are present. Sinuses/Orbits: The paranasal sinuses and mastoid air cells are clear. The globes and orbits are within normal limits. IMPRESSION: 1. Acute/subacute mixed density right subdural hematoma measuring up to 8.5 mm on coronal imaging. 2. Focal mass effect with minimal midline shift. 3. Moderate atrophy, predominantly within the temporal lobe is stable. These results were called by telephone at the time of interpretation on 07/01/2019 at 10:54 am to the patient's nurse, Lattie Haw. Who verbally acknowledged these results. Electronically Signed   By: San Morelle M.D.    On: 07/01/2019 10:56    Micro Results    Recent Results (from the past 240 hour(s))  SARS CORONAVIRUS 2 (TAT 6-24 HRS) Nasopharyngeal Nasopharyngeal Swab     Status: None   Collection Time: 06/24/19  8:51 PM   Specimen: Nasopharyngeal Swab  Result Value Ref Range Status   SARS Coronavirus 2 NEGATIVE NEGATIVE Final    Comment: (NOTE) SARS-CoV-2 target nucleic acids are NOT DETECTED. The SARS-CoV-2 RNA is generally detectable in upper and lower respiratory specimens during the acute phase of infection. Negative results do not preclude SARS-CoV-2 infection, do not rule out co-infections with other pathogens, and should not be used as the sole basis for treatment or other patient management decisions. Negative results must be combined with clinical observations, patient history, and epidemiological information. The expected result is Negative. Fact Sheet for Patients: SugarRoll.be Fact Sheet for Healthcare Providers: https://www.woods-mathews.com/ This test is not yet approved or cleared by the [place] FDA and  has been authorized for detection and/or diagnosis of SARS-CoV-2 by FDA under an Emergency Use Authorization (EUA). This EUA will remain  in effect (meaning this test can be used) for the duration of the COVID-19 declaration under Section 56 4(b)(1) of the Act, 21 U.S.C. section 360bbb-3(b)(1), unless the authorization is terminated or revoked sooner. Performed at [facility], [place] [address], [place], [place] [address]        Today   Subjective    Ronique Cito today has no new complaints -Eating and drinking well, no vomiting or diarrhea          Patient has been seen and examined prior to discharge   Objective   Blood pressure 116/72, pulse 66, temperature 98.2 F (36.8 C), temperature source Oral, resp. rate 17, height 5\' 2"  (1.575 m), weight 60.1 kg, SpO2 100 %.   Intake/Output Summary (Last  24 hours) at 07/03/2019 1542 Last data filed at 07/03/2019 1300 Gross per 24 hour  Intake 480 ml  Output 350 ml  Net 130 ml    Exam Gen:- Awake Alert,  In no apparent distress  HEENT:- [place].AT, No sclera icterus Neck-Supple Neck,No JVD,.  Lungs-  CTAB , fair symmetrical air movement CV- S1, S2 normal, regular  Abd-  +  ve B.Sounds, Abd Soft, No tenderness,    Extremity/Skin:- No  edema, pedal pulses present  Psych-affect is appropriate, oriented x3 (a lot more coherent, occasional confusional episodes) Neuro-generalized weakness, no new focal deficits, +ve  Parkinson's tremors   Data Review   CBC w Diff:  Lab Results  Component Value Date   WBC 4.3 07/03/2019   HGB 9.7 (L) 07/03/2019   HCT 31.3 (L) 07/03/2019   PLT 296 07/03/2019   LYMPHOPCT 32 06/24/2019   MONOPCT 7 06/24/2019   EOSPCT 1 06/24/2019   BASOPCT 0 06/24/2019    CMP:  Lab Results  Component Value Date   NA 140 07/03/2019   NA 143 06/26/2010   K 3.5 07/03/2019   K 4.6 06/26/2010   CL 110 07/03/2019   CO2 23 07/03/2019   BUN 18 07/03/2019   BUN 13 06/26/2010   CREATININE 1.09 (H) 07/03/2019   CREATININE 0.64 10/03/2013   GLU 130 06/26/2010   PROT 6.8 06/25/2019   ALBUMIN 3.4 (L) 07/03/2019   ALBUMIN 4.1 06/26/2010   BILITOT 0.7 06/25/2019   BILITOT 0.3 06/26/2010   ALKPHOS 81 06/25/2019   ALKPHOS 92 06/26/2010   AST 12 (L) 06/25/2019   AST 17 06/26/2010   ALT 14 06/25/2019  . -- Follow- up with neurosurgeon Dr. Christella Noa ([facility])-- [phone]----in 1 week (around 07/10/2019) for recheck and reevaluation due to intracranial hemorrhage   Total Discharge time is about 33 minutes  Roxan Hockey M.D on 07/03/2019 at 3:42 PM  Go to [email or web address] -  for contact info  [facility] - Office  [phone]

## 2019-07-05 DIAGNOSIS — E119 Type 2 diabetes mellitus without complications: Secondary | ICD-10-CM | POA: Diagnosis not present

## 2019-07-05 DIAGNOSIS — R5381 Other malaise: Secondary | ICD-10-CM | POA: Diagnosis not present

## 2019-07-05 DIAGNOSIS — N179 Acute kidney failure, unspecified: Secondary | ICD-10-CM | POA: Diagnosis not present

## 2019-07-05 DIAGNOSIS — S065X0D Traumatic subdural hemorrhage without loss of consciousness, subsequent encounter: Secondary | ICD-10-CM | POA: Diagnosis not present

## 2019-07-09 DIAGNOSIS — M4722 Other spondylosis with radiculopathy, cervical region: Secondary | ICD-10-CM | POA: Diagnosis not present

## 2019-07-10 DIAGNOSIS — R112 Nausea with vomiting, unspecified: Secondary | ICD-10-CM | POA: Diagnosis not present

## 2019-07-10 DIAGNOSIS — G20 Parkinson's disease: Secondary | ICD-10-CM | POA: Diagnosis not present

## 2019-07-10 DIAGNOSIS — S065X0D Traumatic subdural hemorrhage without loss of consciousness, subsequent encounter: Secondary | ICD-10-CM | POA: Diagnosis not present

## 2019-07-10 DIAGNOSIS — I10 Essential (primary) hypertension: Secondary | ICD-10-CM | POA: Diagnosis not present

## 2019-07-11 ENCOUNTER — Emergency Department (HOSPITAL_COMMUNITY): Payer: Medicare Other

## 2019-07-11 ENCOUNTER — Other Ambulatory Visit: Payer: Self-pay

## 2019-07-11 ENCOUNTER — Emergency Department (HOSPITAL_COMMUNITY)
Admission: EM | Admit: 2019-07-11 | Discharge: 2019-07-11 | Disposition: A | Discharge status: Home / Self Care | Payer: Medicare Other | Attending: Emergency Medicine | Admitting: Emergency Medicine

## 2019-07-11 ENCOUNTER — Encounter (HOSPITAL_COMMUNITY): Payer: Self-pay | Admitting: Emergency Medicine

## 2019-07-11 DIAGNOSIS — Z79899 Other long term (current) drug therapy: Secondary | ICD-10-CM | POA: Diagnosis not present

## 2019-07-11 DIAGNOSIS — E119 Type 2 diabetes mellitus without complications: Secondary | ICD-10-CM | POA: Diagnosis not present

## 2019-07-11 DIAGNOSIS — S065X0A Traumatic subdural hemorrhage without loss of consciousness, initial encounter: Secondary | ICD-10-CM | POA: Diagnosis not present

## 2019-07-11 DIAGNOSIS — Z7984 Long term (current) use of oral hypoglycemic drugs: Secondary | ICD-10-CM | POA: Insufficient documentation

## 2019-07-11 DIAGNOSIS — R410 Disorientation, unspecified: Secondary | ICD-10-CM | POA: Diagnosis not present

## 2019-07-11 DIAGNOSIS — N179 Acute kidney failure, unspecified: Secondary | ICD-10-CM | POA: Diagnosis not present

## 2019-07-11 DIAGNOSIS — R4182 Altered mental status, unspecified: Secondary | ICD-10-CM | POA: Insufficient documentation

## 2019-07-11 DIAGNOSIS — G20 Parkinson's disease: Secondary | ICD-10-CM | POA: Diagnosis not present

## 2019-07-11 DIAGNOSIS — Z8679 Personal history of other diseases of the circulatory system: Secondary | ICD-10-CM

## 2019-07-11 DIAGNOSIS — S065X0D Traumatic subdural hemorrhage without loss of consciousness, subsequent encounter: Secondary | ICD-10-CM | POA: Diagnosis not present

## 2019-07-11 DIAGNOSIS — I10 Essential (primary) hypertension: Secondary | ICD-10-CM | POA: Insufficient documentation

## 2019-07-11 HISTORY — DX: Traumatic subdural hemorrhage with loss of consciousness of unspecified duration, initial encounter: S06.5X9A

## 2019-07-11 HISTORY — DX: Traumatic subdural hemorrhage with loss of consciousness status unknown, initial encounter: S06.5XAA

## 2019-07-11 LAB — CBC WITH DIFFERENTIAL/PLATELET
Abs Immature Granulocytes: 0.03 10*3/uL (ref 0.00–0.07)
Basophils Absolute: 0 10*3/uL (ref 0.0–0.1)
Basophils Relative: 0 %
Eosinophils Absolute: 0.1 10*3/uL (ref 0.0–0.5)
Eosinophils Relative: 2 %
HCT: 33.3 % — ABNORMAL LOW (ref 36.0–46.0)
Hemoglobin: 10.4 g/dL — ABNORMAL LOW (ref 12.0–15.0)
Immature Granulocytes: 1 %
Lymphocytes Relative: 30 %
Lymphs Abs: 1.5 10*3/uL (ref 0.7–4.0)
MCH: 27.6 pg (ref 26.0–34.0)
MCHC: 31.2 g/dL (ref 30.0–36.0)
MCV: 88.3 fL (ref 80.0–100.0)
Monocytes Absolute: 0.3 10*3/uL (ref 0.1–1.0)
Monocytes Relative: 6 %
Neutro Abs: 3.2 10*3/uL (ref 1.7–7.7)
Neutrophils Relative %: 61 %
Platelets: 339 10*3/uL (ref 150–400)
RBC: 3.77 MIL/uL — ABNORMAL LOW (ref 3.87–5.11)
RDW: 14.3 % (ref 11.5–15.5)
WBC: 5.1 10*3/uL (ref 4.0–10.5)
nRBC: 0 % (ref 0.0–0.2)

## 2019-07-11 LAB — BASIC METABOLIC PANEL
Anion gap: 7 (ref 5–15)
BUN: 10 mg/dL (ref 8–23)
CO2: 27 mmol/L (ref 22–32)
Calcium: 9.4 mg/dL (ref 8.9–10.3)
Chloride: 104 mmol/L (ref 98–111)
Creatinine, Ser: 0.93 mg/dL (ref 0.44–1.00)
GFR calc Af Amer: >60 mL/min (ref >60)
GFR calc non Af Amer: >60 mL/min (ref >60)
Glucose, Bld: 107 mg/dL — ABNORMAL HIGH (ref 70–99)
Potassium: 3.7 mmol/L (ref 3.5–5.1)
Sodium: 138 mmol/L (ref 135–145)

## 2019-07-11 NOTE — ED Notes (Signed)
Spoke with SNF and was told patient started developing confusion and halucinations yesterday. Patient was seen by Dr. Arnoldo Morale and asked to have pt evaluated at ED.

## 2019-07-11 NOTE — ED Triage Notes (Signed)
Per EMS, pt from [place]. Pt fell a couple of weeks ago and diagnosed with a subdural hematoma. Pt has been experiencing dizziness, blurred vision, nausea, and headache since her diagnosis, but symptoms have worsened so facility sent her here for further evaluation. AOx4 at this time.

## 2019-07-11 NOTE — ED Provider Notes (Signed)
[facility] EMERGENCY DEPARTMENT Provider Note   CSN: [id number] Arrival date & time: 07/11/19  1312     History   Chief Complaint Chief Complaint  Patient presents with  . Dizziness    HPI Linda English is a 63 y.o. female.     Patient is a 63 year old female with past medical history of bipolar disorder, diabetes, hypertension, irritable bowel, and recent diagnosis of subdural hematoma.  This was treated expectantly and did not require intervention.  Patient has been at the [facility] facility for the past 2 weeks.  According to staff there, she developed confusion and reports hallucinations.  Patient tells me that she saw a "cat jump off the sink and under her bed".  She denies to me that she is having worsening headache, visual disturbances, numbness, weakness, or other neurologic issues.  The history is provided by the patient.    Past Medical History:  Diagnosis Date  . Acid reflux   . Allergic rhinitis   . Anxiety   . Arthritis    osteoarthritis  . Bipolar disorder ([place])   . Chronic headache   . Depression   . Diverticula of colon   . DM (diabetes mellitus) ([place])   . Gastric erosions   . Gastric polyps    benign   . Hemorrhoids   . Hiatal hernia   . Hypertension   . IBS (irritable bowel syndrome)   . Lichen planus   . Obstructive sleep apnea   . Panic disorder   . Parkinson's disease ([place])   . Rectal cancer ([place]) 2003   ileostomy and reversal  . Subdural hematoma ([place])   . Tubular adenoma     Patient Active Problem List   Diagnosis Date Noted  . Rt Sided Subdural hematoma, post-traumatic  07/02/2019  . AKI (acute kidney injury) ([place]) 06/26/2019  . ARF (acute renal failure) ([place]) 06/24/2019  . Nausea & vomiting 06/05/2019  . Diarrhea 07/27/2017  . Transient ischemic attack 08/09/2015  . Reflux esophagitis   . Gastric polyp   . Dyspepsia 06/13/2015  . Dysphagia 06/13/2015  . Incisional irritation 06/18/2013  . Wound  drainage 06/18/2013  . Bipolar 1 disorder ([place]) 06/08/2012    Class: Chronic  . Anorexia 05/23/2012  . Insomnia 05/23/2012  . Weight loss 05/23/2012  . Weight loss, abnormal 04/10/2012  . Esophageal dysphagia 04/10/2012  . Nausea 09/08/2011  . GERD (gastroesophageal reflux disease) 07/14/2011  . Epigastric pain 07/14/2011  . Constipation 07/14/2011  . Knee pain 02/09/2011  . OA (osteoarthritis) of knee 02/09/2011  . Family hx of colon cancer 11/30/2010  . Bowel habit changes 11/30/2010  . OSTEOARTHRITIS, KNEE 08/05/2010  . PES PLANUS 08/05/2010  . RECTAL CANCER 10/23/2007  . Diabetes ([place]) 10/23/2007  . HYPERLIPIDEMIA 10/23/2007  . Anxiety state 10/23/2007  . PANIC DISORDER 10/23/2007  . DEPRESSION 10/23/2007  . OBSTRUCTIVE SLEEP APNEA 10/23/2007  . Essential hypertension 10/23/2007  . ALLERGIC RHINITIS 10/23/2007  . IBS 10/23/2007  . HEADACHE, CHRONIC 10/23/2007    Past Surgical History:  Procedure Laterality Date  . ABDOMINAL HYSTERECTOMY    . APPENDECTOMY    . BACK SURGERY    . CESAREAN SECTION     X  2  . CHOLECYSTECTOMY    . COLONOSCOPY  08/2007   DR Gala Romney, friable anal canal hemorrhoids, surgical anastomosis at 3cm, distal scattered tics  . COLONOSCOPY  12/15/2010   anal papilla and internal hemorrhoids/diminutive polyp in the base of the cecum. Past, tubular  adenoma.. Next colonoscopy due in April 2017.  Marland Kitchen COLONOSCOPY  10/27/2005   RMR: Anal canal hemorrhoids. Surgical anastomosis at 3 cm from the anal verge appeared normal. Few scattered distal diverticula. The residual  colonic mucosa appeared normal. I suspect the patient bled from hemorrhoids  . COLONOSCOPY N/A 01/15/2015   [id number] residual rectum and colon. next tcs 12/2019  . ESOPHAGEAL DILATION N/A 06/27/2015   Procedure: ESOPHAGEAL DILATION;  Surgeon: Daneil Dolin, MD;  Location: [facility] ENDO SUITE;  Service: Endoscopy;  Laterality: N/A;  . ESOPHAGOGASTRODUODENOSCOPY  05/2002   DR ROURK, normal  .  ESOPHAGOGASTRODUODENOSCOPY  08/03/2011   RMR: small HH/ gastric polyps  . ESOPHAGOGASTRODUODENOSCOPY N/A 06/27/2015   Dr. Gala Romney: Mild erosive reflux esophagitits. Status post passage of a Maloney dilator. Hiatal hernia. Gastric polyps and abnormal gastric mucosa of doubtful clinical significane. status post biopsy, benign fundic gland polyp, negative H.pylori  . low anterior rection  2003   RECTAL CANCER  . SPINE SURGERY  2001   L5,S1 HEMILAMINOTOMY AND DISCECTOMY/DR DEATON     OB History    Gravida      Para      Term      Preterm      AB      Living  2     SAB      TAB      Ectopic      Multiple      Live Births               Home Medications    Prior to Admission medications   Medication Sig Start Date End Date Taking? Authorizing Provider  ALPRAZolam Duanne Moron) 0.5 MG tablet Take 1 tablet (0.5 mg total) by mouth 3 (three) times daily as needed for anxiety or sleep. 07/03/19   Roxan Hockey, MD  Amantadine HCl 100 MG tablet Take 1 tablet by mouth 2 (two) times daily. 05/03/19   [provider]  amLODipine (NORVASC) 5 MG tablet Take 1 tablet (5 mg total) by mouth daily. 07/04/19   Roxan Hockey, MD  atorvastatin (LIPITOR) 10 MG tablet Take 10 mg by mouth 3 (three) times a week.  06/15/18   [provider]  Cholecalciferol (VITAMIN D3) 1.25 MG (50000 UT) CAPS Take 1 tablet by mouth once a week.  11/07/18   [provider]  insulin aspart (NOVOLOG) 100 UNIT/ML injection insulin aspart (novoLOG) injection 0-9 Units 0-9 Units, Subcutaneous, 3 times daily with meals, CBG < 70: Implement Hypoglycemia Standing Orders and refer to Hypoglycemia Standing Orders sidebar report  CBG 70 - 120: 0 units CBG 121 - 150: 1 unit CBG 151 - 200: 2 units CBG 201 - 250: 3 units CBG 251 - 300: 5 units CBG 301 - 350: 7 units CBG 351 - 400: 9 units CBG > 400: 07/03/19 07/02/20  Roxan Hockey, MD  lamoTRIgine (LAMICTAL) 200 MG tablet Take 200 mg by mouth 2 (two)  times daily.  06/11/13   [provider]  levETIRAcetam (KEPPRA) 500 MG tablet Take 500 mg by mouth at bedtime.  09/15/15   [provider]  metFORMIN (GLUCOPHAGE-XR) 500 MG 24 hr tablet Take 500 mg by mouth 2 (two) times daily. 06/13/12   Darrol Jump, MD  metoprolol tartrate (LOPRESSOR) 25 MG tablet Take 1 tablet (25 mg total) by mouth 2 (two) times daily. 07/03/19   Roxan Hockey, MD  nystatin (MYCOSTATIN/NYSTOP) powder Apply topically 2 (two) times daily. 07/03/19   Roxan Hockey, MD  ondansetron (ZOFRAN ODT) 4 MG disintegrating tablet Take 1 tablet (4 mg total) by mouth every 8 (eight) hours as needed for nausea or vomiting. 06/05/19   Annitta Needs, NP  pantoprazole (PROTONIX) 40 MG tablet Take 1 tablet (40 mg total) by mouth daily. 07/03/19   Roxan Hockey, MD  traZODone (DESYREL) 100 MG tablet Take 1 tablet (100 mg total) by mouth at bedtime. 07/03/19   Roxan Hockey, MD    Family History Family History  Problem Relation Age of Onset  . Colon cancer Paternal Grandfather 60  . Colon polyps Brother   . Colon polyps Cousin     Social History Social History   Tobacco Use  . Smoking status: Never Smoker  . Smokeless tobacco: Never Used  . Tobacco comment: Never smoked  Substance Use Topics  . Alcohol use: No  . Drug use: No     Allergies   Shellfish allergy, Lithium, and Excedrin extra strength [aspirin-acetaminophen-caffeine]   Review of Systems Review of Systems  All other systems reviewed and are negative.    Physical Exam Updated Vital Signs BP 123/83 (BP Location: Right Arm)   Pulse 73   Temp 98.6 F (37 C) (Oral)   Resp 16   Ht 5\' 1"  (1.549 m)   Wt 54.9 kg   SpO2 100%   BMI 22.86 kg/m   Physical Exam Vitals signs and nursing note reviewed.  Constitutional:      General: She is not in acute distress.    Appearance: She is well-developed. She is not diaphoretic.  HENT:     Head: Normocephalic and atraumatic.  Eyes:      Extraocular Movements: Extraocular movements intact.     Pupils: Pupils are equal, round, and reactive to light.  Neck:     Musculoskeletal: Normal range of motion and neck supple.  Cardiovascular:     Rate and Rhythm: Normal rate and regular rhythm.     Heart sounds: No murmur. No friction rub. No gallop.   Pulmonary:     Effort: Pulmonary effort is normal. No respiratory distress.     Breath sounds: Normal breath sounds. No wheezing.  Abdominal:     General: Bowel sounds are normal. There is no distension.     Palpations: Abdomen is soft.     Tenderness: There is no abdominal tenderness.  Musculoskeletal: Normal range of motion.  Skin:    General: Skin is warm and dry.  Neurological:     General: No focal deficit present.     Mental Status: She is alert and oriented to person, place, and time.     Cranial Nerves: No cranial nerve deficit.     Sensory: No sensory deficit.     Motor: No weakness.     Coordination: Coordination normal.      ED Treatments / Results  Labs (all labs ordered are listed, but only abnormal results are displayed) Labs Reviewed  BASIC METABOLIC PANEL  CBC WITH DIFFERENTIAL/PLATELET  URINALYSIS, ROUTINE W REFLEX MICROSCOPIC    EKG None  Radiology No results found.  Procedures Procedures (including critical care time)  Medications Ordered in ED Medications - No data to display   Initial Impression / Assessment and Plan / ED Course  I have reviewed the triage vital signs and the nursing notes.  Pertinent labs & imaging results that were available during my care of the patient were reviewed by me and considered in my medical decision making (see chart for details).  Patient sent  from her rehab facility for evaluation of confusion and hallucinations.  Patient recently diagnosed with subdural hematoma that based on CT scan today appears to be shrinking.  Patient's laboratory studies are unremarkable and urinalysis performed yesterday at her  facility was unremarkable as well.  Patient's daughter is present at bedside and feels as though she has at her baseline.  Patient will be discharged back to her extended care facility and is to follow-up as needed.  Final Clinical Impressions(s) / ED Diagnoses   Final diagnoses:  None    ED Discharge Orders    None       Veryl Speak, MD 07/11/19 1535

## 2019-07-11 NOTE — ED Notes (Signed)
EMS arrived to transport pt back to [facility]  

## 2019-07-11 NOTE — Discharge Instructions (Addendum)
Continue medications as previously prescribed.  Return to the emergency department for severe headache, difficulty waking, high fever, or other new and concerning symptoms.

## 2019-07-11 NOTE — ED Notes (Signed)
Report called to Roselyn Reef at [facility]. EMS scheduled to take patient back to Nursing home.

## 2019-07-12 NOTE — Telephone Encounter (Signed)
PA for Ondansetron ODT was denied. Medication was denied because the policy states that the medication may be approved if pt has Hyperemesis Gravidarum, is receiving radiation therapy, or are not receiving moderate to highly emetogenic chemotherapy.   I called the facility that pt is currently in. Spoke with [place], pt's nurse. Pt is receiving Zofran medication at this time with no problems. If they have problems with getting it, they are going to contact our office.

## 2019-07-19 DIAGNOSIS — R5381 Other malaise: Secondary | ICD-10-CM | POA: Diagnosis not present

## 2019-07-19 DIAGNOSIS — G20 Parkinson's disease: Secondary | ICD-10-CM | POA: Diagnosis not present

## 2019-07-19 DIAGNOSIS — S065X0D Traumatic subdural hemorrhage without loss of consciousness, subsequent encounter: Secondary | ICD-10-CM | POA: Diagnosis not present

## 2019-07-19 DIAGNOSIS — N179 Acute kidney failure, unspecified: Secondary | ICD-10-CM | POA: Diagnosis not present

## 2019-07-24 DIAGNOSIS — Z7984 Long term (current) use of oral hypoglycemic drugs: Secondary | ICD-10-CM | POA: Diagnosis not present

## 2019-07-24 DIAGNOSIS — I10 Essential (primary) hypertension: Secondary | ICD-10-CM | POA: Diagnosis not present

## 2019-07-24 DIAGNOSIS — K589 Irritable bowel syndrome without diarrhea: Secondary | ICD-10-CM | POA: Diagnosis not present

## 2019-07-24 DIAGNOSIS — K219 Gastro-esophageal reflux disease without esophagitis: Secondary | ICD-10-CM | POA: Diagnosis not present

## 2019-07-24 DIAGNOSIS — S065X0D Traumatic subdural hemorrhage without loss of consciousness, subsequent encounter: Secondary | ICD-10-CM | POA: Diagnosis not present

## 2019-07-24 DIAGNOSIS — Z85048 Personal history of other malignant neoplasm of rectum, rectosigmoid junction, and anus: Secondary | ICD-10-CM | POA: Diagnosis not present

## 2019-07-24 DIAGNOSIS — G20 Parkinson's disease: Secondary | ICD-10-CM | POA: Diagnosis not present

## 2019-07-24 DIAGNOSIS — Z9181 History of falling: Secondary | ICD-10-CM | POA: Diagnosis not present

## 2019-07-24 DIAGNOSIS — M158 Other polyosteoarthritis: Secondary | ICD-10-CM | POA: Diagnosis not present

## 2019-07-24 DIAGNOSIS — E119 Type 2 diabetes mellitus without complications: Secondary | ICD-10-CM | POA: Diagnosis not present

## 2019-07-24 DIAGNOSIS — G4730 Sleep apnea, unspecified: Secondary | ICD-10-CM | POA: Diagnosis not present

## 2019-07-30 DIAGNOSIS — K219 Gastro-esophageal reflux disease without esophagitis: Secondary | ICD-10-CM | POA: Diagnosis not present

## 2019-07-30 DIAGNOSIS — M158 Other polyosteoarthritis: Secondary | ICD-10-CM | POA: Diagnosis not present

## 2019-07-30 DIAGNOSIS — S065X0D Traumatic subdural hemorrhage without loss of consciousness, subsequent encounter: Secondary | ICD-10-CM | POA: Diagnosis not present

## 2019-07-30 DIAGNOSIS — E119 Type 2 diabetes mellitus without complications: Secondary | ICD-10-CM | POA: Diagnosis not present

## 2019-07-30 DIAGNOSIS — G20 Parkinson's disease: Secondary | ICD-10-CM | POA: Diagnosis not present

## 2019-07-30 DIAGNOSIS — Z7984 Long term (current) use of oral hypoglycemic drugs: Secondary | ICD-10-CM | POA: Diagnosis not present

## 2019-07-30 DIAGNOSIS — Z9181 History of falling: Secondary | ICD-10-CM | POA: Diagnosis not present

## 2019-07-30 DIAGNOSIS — G4730 Sleep apnea, unspecified: Secondary | ICD-10-CM | POA: Diagnosis not present

## 2019-07-30 DIAGNOSIS — Z85048 Personal history of other malignant neoplasm of rectum, rectosigmoid junction, and anus: Secondary | ICD-10-CM | POA: Diagnosis not present

## 2019-07-30 DIAGNOSIS — K589 Irritable bowel syndrome without diarrhea: Secondary | ICD-10-CM | POA: Diagnosis not present

## 2019-07-30 DIAGNOSIS — I10 Essential (primary) hypertension: Secondary | ICD-10-CM | POA: Diagnosis not present

## 2019-08-02 DIAGNOSIS — Z85048 Personal history of other malignant neoplasm of rectum, rectosigmoid junction, and anus: Secondary | ICD-10-CM | POA: Diagnosis not present

## 2019-08-02 DIAGNOSIS — K219 Gastro-esophageal reflux disease without esophagitis: Secondary | ICD-10-CM | POA: Diagnosis not present

## 2019-08-02 DIAGNOSIS — M158 Other polyosteoarthritis: Secondary | ICD-10-CM | POA: Diagnosis not present

## 2019-08-02 DIAGNOSIS — K589 Irritable bowel syndrome without diarrhea: Secondary | ICD-10-CM | POA: Diagnosis not present

## 2019-08-02 DIAGNOSIS — G4730 Sleep apnea, unspecified: Secondary | ICD-10-CM | POA: Diagnosis not present

## 2019-08-02 DIAGNOSIS — I10 Essential (primary) hypertension: Secondary | ICD-10-CM | POA: Diagnosis not present

## 2019-08-02 DIAGNOSIS — Z7984 Long term (current) use of oral hypoglycemic drugs: Secondary | ICD-10-CM | POA: Diagnosis not present

## 2019-08-02 DIAGNOSIS — E119 Type 2 diabetes mellitus without complications: Secondary | ICD-10-CM | POA: Diagnosis not present

## 2019-08-02 DIAGNOSIS — G20 Parkinson's disease: Secondary | ICD-10-CM | POA: Diagnosis not present

## 2019-08-02 DIAGNOSIS — Z9181 History of falling: Secondary | ICD-10-CM | POA: Diagnosis not present

## 2019-08-02 DIAGNOSIS — S065X0D Traumatic subdural hemorrhage without loss of consciousness, subsequent encounter: Secondary | ICD-10-CM | POA: Diagnosis not present

## 2019-08-06 DIAGNOSIS — I10 Essential (primary) hypertension: Secondary | ICD-10-CM | POA: Diagnosis not present

## 2019-08-06 DIAGNOSIS — M158 Other polyosteoarthritis: Secondary | ICD-10-CM | POA: Diagnosis not present

## 2019-08-06 DIAGNOSIS — K589 Irritable bowel syndrome without diarrhea: Secondary | ICD-10-CM | POA: Diagnosis not present

## 2019-08-06 DIAGNOSIS — S065X0D Traumatic subdural hemorrhage without loss of consciousness, subsequent encounter: Secondary | ICD-10-CM | POA: Diagnosis not present

## 2019-08-06 DIAGNOSIS — Z85048 Personal history of other malignant neoplasm of rectum, rectosigmoid junction, and anus: Secondary | ICD-10-CM | POA: Diagnosis not present

## 2019-08-06 DIAGNOSIS — Z9181 History of falling: Secondary | ICD-10-CM | POA: Diagnosis not present

## 2019-08-06 DIAGNOSIS — G20 Parkinson's disease: Secondary | ICD-10-CM | POA: Diagnosis not present

## 2019-08-06 DIAGNOSIS — E119 Type 2 diabetes mellitus without complications: Secondary | ICD-10-CM | POA: Diagnosis not present

## 2019-08-06 DIAGNOSIS — K219 Gastro-esophageal reflux disease without esophagitis: Secondary | ICD-10-CM | POA: Diagnosis not present

## 2019-08-06 DIAGNOSIS — G4730 Sleep apnea, unspecified: Secondary | ICD-10-CM | POA: Diagnosis not present

## 2019-08-06 DIAGNOSIS — Z7984 Long term (current) use of oral hypoglycemic drugs: Secondary | ICD-10-CM | POA: Diagnosis not present

## 2019-08-06 NOTE — Progress Notes (Deleted)
history of alternating constipation and diarrhea, rectal cancer s/p resection in 2003. Colonoscopy May 2016 with normal residual rectum and colon. Surgical anastomosis at approximately 5 cm from anal verge. 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 She underwent at least 7 sessions of biofeedback retraining.It has been quite difficult to manage bowel regimen, as medications will work for some time and then result in diarrhea.For the past few months, she has done well without prescriptive agents.   Hospitalized in interim from last visit with AKI due to intractable N/V. CT done due to fall prior to admission, with subdural hemorrhage

## 2019-08-07 ENCOUNTER — Telehealth: Payer: Self-pay | Admitting: Internal Medicine

## 2019-08-07 ENCOUNTER — Ambulatory Visit: Payer: Medicare Other | Admitting: Gastroenterology

## 2019-08-07 ENCOUNTER — Encounter: Payer: Self-pay | Admitting: Internal Medicine

## 2019-08-07 DIAGNOSIS — G2111 Neuroleptic induced parkinsonism: Secondary | ICD-10-CM | POA: Diagnosis not present

## 2019-08-07 DIAGNOSIS — Z79899 Other long term (current) drug therapy: Secondary | ICD-10-CM | POA: Diagnosis not present

## 2019-08-07 DIAGNOSIS — R296 Repeated falls: Secondary | ICD-10-CM | POA: Diagnosis not present

## 2019-08-07 DIAGNOSIS — I952 Hypotension due to drugs: Secondary | ICD-10-CM | POA: Diagnosis not present

## 2019-08-07 NOTE — Telephone Encounter (Signed)
PATIENT WAS A NO SHOW AND LETTER SENT  °

## 2019-08-09 ENCOUNTER — Other Ambulatory Visit: Payer: Self-pay

## 2019-08-09 ENCOUNTER — Emergency Department (HOSPITAL_COMMUNITY)
Admission: EM | Admit: 2019-08-09 | Discharge: 2019-08-09 | Disposition: A | Discharge status: Home / Self Care | Payer: Medicare Other | Attending: Emergency Medicine | Admitting: Emergency Medicine

## 2019-08-09 ENCOUNTER — Telehealth: Payer: Self-pay | Admitting: Internal Medicine

## 2019-08-09 DIAGNOSIS — R1110 Vomiting, unspecified: Secondary | ICD-10-CM | POA: Insufficient documentation

## 2019-08-09 DIAGNOSIS — S065X0D Traumatic subdural hemorrhage without loss of consciousness, subsequent encounter: Secondary | ICD-10-CM | POA: Diagnosis not present

## 2019-08-09 DIAGNOSIS — I10 Essential (primary) hypertension: Secondary | ICD-10-CM | POA: Diagnosis not present

## 2019-08-09 DIAGNOSIS — G20 Parkinson's disease: Secondary | ICD-10-CM | POA: Diagnosis not present

## 2019-08-09 DIAGNOSIS — Z85048 Personal history of other malignant neoplasm of rectum, rectosigmoid junction, and anus: Secondary | ICD-10-CM | POA: Diagnosis not present

## 2019-08-09 DIAGNOSIS — Z7984 Long term (current) use of oral hypoglycemic drugs: Secondary | ICD-10-CM | POA: Diagnosis not present

## 2019-08-09 DIAGNOSIS — Z5321 Procedure and treatment not carried out due to patient leaving prior to being seen by health care provider: Secondary | ICD-10-CM | POA: Diagnosis not present

## 2019-08-09 DIAGNOSIS — M158 Other polyosteoarthritis: Secondary | ICD-10-CM | POA: Diagnosis not present

## 2019-08-09 DIAGNOSIS — K589 Irritable bowel syndrome without diarrhea: Secondary | ICD-10-CM | POA: Diagnosis not present

## 2019-08-09 DIAGNOSIS — E119 Type 2 diabetes mellitus without complications: Secondary | ICD-10-CM | POA: Diagnosis not present

## 2019-08-09 DIAGNOSIS — G4730 Sleep apnea, unspecified: Secondary | ICD-10-CM | POA: Diagnosis not present

## 2019-08-09 DIAGNOSIS — K219 Gastro-esophageal reflux disease without esophagitis: Secondary | ICD-10-CM | POA: Diagnosis not present

## 2019-08-09 DIAGNOSIS — Z9181 History of falling: Secondary | ICD-10-CM | POA: Diagnosis not present

## 2019-08-09 NOTE — Telephone Encounter (Signed)
Pt home healthcare nurse, Star [phone], called to discuss some issues the patient is having, please call her when you get a chance

## 2019-08-09 NOTE — Telephone Encounter (Signed)
Spoke with the home health nurse. Pt has been vomiting for the last 3 days. Pt isn't able to keep any foods or drinks down. Pt is taking Zofran and Pantoprazole which was prescribed at our office. Pt called her PCP and they recommended ED evaluation. Pt's nurse is aware that if pt can't keep anything down, ED evaluation is the best thing to do. Pt's nurse wasn't sure why pt continues to have problems with the vomiting. Pt is diabetic and when she continues to vomit, her glucose level drops and pt gets weak. They report no fever, no chills, no cough.

## 2019-08-09 NOTE — ED Triage Notes (Signed)
Patient presents to the ED with emesis for 3 days.  Patient feels nauseated.  Patient reports one episode of diarrhea.  No fever reported.

## 2019-08-10 DIAGNOSIS — N179 Acute kidney failure, unspecified: Secondary | ICD-10-CM | POA: Diagnosis not present

## 2019-08-10 DIAGNOSIS — S065X0D Traumatic subdural hemorrhage without loss of consciousness, subsequent encounter: Secondary | ICD-10-CM | POA: Diagnosis not present

## 2019-08-10 DIAGNOSIS — G20 Parkinson's disease: Secondary | ICD-10-CM | POA: Diagnosis not present

## 2019-08-10 NOTE — Telephone Encounter (Signed)
Agree, she needs ED evaluation.

## 2019-08-10 NOTE — Telephone Encounter (Signed)
Spoke with Star the home health nurse, she recommended ED evaluation yesterday. Pt went to the ED last night and saying it was full at [facility] and she didn't want to wait for hours. Pt hadn't vomited anymore per the daughter. Star still recommended that pt stay at the ED for eval. Pt may be returning to the ED today if she hasn't improved.

## 2019-08-12 ENCOUNTER — Observation Stay (HOSPITAL_COMMUNITY)
Admission: EM | Admit: 2019-08-12 | Discharge: 2019-08-13 | Disposition: A | Discharge status: Home / Self Care | Payer: Medicare Other | Attending: Family Medicine | Admitting: Family Medicine

## 2019-08-12 ENCOUNTER — Other Ambulatory Visit: Payer: Self-pay

## 2019-08-12 ENCOUNTER — Emergency Department (HOSPITAL_COMMUNITY): Payer: Medicare Other

## 2019-08-12 ENCOUNTER — Encounter (HOSPITAL_COMMUNITY): Payer: Self-pay | Admitting: Emergency Medicine

## 2019-08-12 DIAGNOSIS — F419 Anxiety disorder, unspecified: Secondary | ICD-10-CM | POA: Diagnosis not present

## 2019-08-12 DIAGNOSIS — E1310 Other specified diabetes mellitus with ketoacidosis without coma: Secondary | ICD-10-CM

## 2019-08-12 DIAGNOSIS — G4733 Obstructive sleep apnea (adult) (pediatric): Secondary | ICD-10-CM | POA: Diagnosis not present

## 2019-08-12 DIAGNOSIS — Z23 Encounter for immunization: Secondary | ICD-10-CM | POA: Insufficient documentation

## 2019-08-12 DIAGNOSIS — E876 Hypokalemia: Secondary | ICD-10-CM | POA: Insufficient documentation

## 2019-08-12 DIAGNOSIS — F319 Bipolar disorder, unspecified: Secondary | ICD-10-CM | POA: Insufficient documentation

## 2019-08-12 DIAGNOSIS — Z85048 Personal history of other malignant neoplasm of rectum, rectosigmoid junction, and anus: Secondary | ICD-10-CM | POA: Diagnosis not present

## 2019-08-12 DIAGNOSIS — I10 Essential (primary) hypertension: Secondary | ICD-10-CM | POA: Diagnosis not present

## 2019-08-12 DIAGNOSIS — R112 Nausea with vomiting, unspecified: Secondary | ICD-10-CM

## 2019-08-12 DIAGNOSIS — Z20828 Contact with and (suspected) exposure to other viral communicable diseases: Secondary | ICD-10-CM | POA: Diagnosis not present

## 2019-08-12 DIAGNOSIS — E785 Hyperlipidemia, unspecified: Secondary | ICD-10-CM | POA: Insufficient documentation

## 2019-08-12 DIAGNOSIS — R000 Tachycardia, unspecified: Secondary | ICD-10-CM | POA: Diagnosis not present

## 2019-08-12 DIAGNOSIS — E1010 Type 1 diabetes mellitus with ketoacidosis without coma: Secondary | ICD-10-CM | POA: Diagnosis not present

## 2019-08-12 DIAGNOSIS — R42 Dizziness and giddiness: Secondary | ICD-10-CM | POA: Insufficient documentation

## 2019-08-12 DIAGNOSIS — Z794 Long term (current) use of insulin: Secondary | ICD-10-CM | POA: Diagnosis not present

## 2019-08-12 DIAGNOSIS — E860 Dehydration: Secondary | ICD-10-CM

## 2019-08-12 DIAGNOSIS — N179 Acute kidney failure, unspecified: Secondary | ICD-10-CM | POA: Insufficient documentation

## 2019-08-12 DIAGNOSIS — K219 Gastro-esophageal reflux disease without esophagitis: Secondary | ICD-10-CM | POA: Diagnosis not present

## 2019-08-12 DIAGNOSIS — R519 Headache, unspecified: Secondary | ICD-10-CM | POA: Diagnosis not present

## 2019-08-12 DIAGNOSIS — Z03818 Encounter for observation for suspected exposure to other biological agents ruled out: Secondary | ICD-10-CM | POA: Diagnosis not present

## 2019-08-12 DIAGNOSIS — Z79899 Other long term (current) drug therapy: Secondary | ICD-10-CM | POA: Diagnosis not present

## 2019-08-12 DIAGNOSIS — G20 Parkinson's disease: Secondary | ICD-10-CM | POA: Insufficient documentation

## 2019-08-12 DIAGNOSIS — R296 Repeated falls: Secondary | ICD-10-CM | POA: Diagnosis not present

## 2019-08-12 DIAGNOSIS — E1110 Type 2 diabetes mellitus with ketoacidosis without coma: Secondary | ICD-10-CM | POA: Diagnosis present

## 2019-08-12 DIAGNOSIS — I69398 Other sequelae of cerebral infarction: Secondary | ICD-10-CM

## 2019-08-12 LAB — URINALYSIS, ROUTINE W REFLEX MICROSCOPIC
Bilirubin Urine: NEGATIVE
Glucose, UA: 50 mg/dL — AB
Ketones, ur: 20 mg/dL — AB
Nitrite: NEGATIVE
Protein, ur: NEGATIVE mg/dL
Specific Gravity, Urine: 1.009 (ref 1.005–1.030)
pH: 6 (ref 5.0–8.0)

## 2019-08-12 LAB — COMPREHENSIVE METABOLIC PANEL
ALT: 28 U/L (ref 0–44)
AST: 34 U/L (ref 15–41)
Albumin: 4.6 g/dL (ref 3.5–5.0)
Alkaline Phosphatase: 94 U/L (ref 38–126)
Anion gap: 19 — ABNORMAL HIGH (ref 5–15)
BUN: 36 mg/dL — ABNORMAL HIGH (ref 8–23)
CO2: 20 mmol/L — ABNORMAL LOW (ref 22–32)
Calcium: 9.9 mg/dL (ref 8.9–10.3)
Chloride: 96 mmol/L — ABNORMAL LOW (ref 98–111)
Creatinine, Ser: 1.6 mg/dL — ABNORMAL HIGH (ref 0.44–1.00)
GFR calc Af Amer: 40 mL/min — ABNORMAL LOW (ref >60)
GFR calc non Af Amer: 34 mL/min — ABNORMAL LOW (ref >60)
Glucose, Bld: 254 mg/dL — ABNORMAL HIGH (ref 70–99)
Potassium: 2.9 mmol/L — ABNORMAL LOW (ref 3.5–5.1)
Sodium: 135 mmol/L (ref 135–145)
Total Bilirubin: 0.5 mg/dL (ref 0.3–1.2)
Total Protein: 7.8 g/dL (ref 6.5–8.1)

## 2019-08-12 LAB — BLOOD GAS, VENOUS
Acid-base deficit: 0.4 mmol/L (ref 0.0–2.0)
Bicarbonate: 23 mmol/L (ref 20.0–28.0)
FIO2: 21
O2 Saturation: 37.5 %
Patient temperature: 36.6
pCO2, Ven: 38.5 mmHg — ABNORMAL LOW (ref 44.0–60.0)
pH, Ven: 7.406 (ref 7.250–7.430)
pO2, Ven: <31 mmHg — CL (ref 32.0–45.0)

## 2019-08-12 LAB — MAGNESIUM: Magnesium: 1.9 mg/dL (ref 1.7–2.4)

## 2019-08-12 LAB — CBC WITH DIFFERENTIAL/PLATELET
Abs Immature Granulocytes: 0.05 10*3/uL (ref 0.00–0.07)
Basophils Absolute: 0 10*3/uL (ref 0.0–0.1)
Basophils Relative: 0 %
Eosinophils Absolute: 0 10*3/uL (ref 0.0–0.5)
Eosinophils Relative: 0 %
HCT: 37.9 % (ref 36.0–46.0)
Hemoglobin: 12.2 g/dL (ref 12.0–15.0)
Immature Granulocytes: 1 %
Lymphocytes Relative: 18 %
Lymphs Abs: 1.2 10*3/uL (ref 0.7–4.0)
MCH: 27.4 pg (ref 26.0–34.0)
MCHC: 32.2 g/dL (ref 30.0–36.0)
MCV: 85 fL (ref 80.0–100.0)
Monocytes Absolute: 0.3 10*3/uL (ref 0.1–1.0)
Monocytes Relative: 4 %
Neutro Abs: 5.4 10*3/uL (ref 1.7–7.7)
Neutrophils Relative %: 77 %
Platelets: 356 10*3/uL (ref 150–400)
RBC: 4.46 MIL/uL (ref 3.87–5.11)
RDW: 13.8 % (ref 11.5–15.5)
WBC: 7 10*3/uL (ref 4.0–10.5)
nRBC: 0 % (ref 0.0–0.2)

## 2019-08-12 LAB — CBG MONITORING, ED
Glucose-Capillary: 91 mg/dL (ref 70–99)
Glucose-Capillary: 70 mg/dL (ref 70–99)
Glucose-Capillary: 92 mg/dL (ref 70–99)
Glucose-Capillary: 105 mg/dL — ABNORMAL HIGH (ref 70–99)
Glucose-Capillary: 98 mg/dL (ref 70–99)
Glucose-Capillary: 114 mg/dL — ABNORMAL HIGH (ref 70–99)
Glucose-Capillary: 108 mg/dL — ABNORMAL HIGH (ref 70–99)

## 2019-08-12 LAB — BASIC METABOLIC PANEL
Anion gap: 17 — ABNORMAL HIGH (ref 5–15)
BUN: 22 mg/dL (ref 8–23)
CO2: 21 mmol/L — ABNORMAL LOW (ref 22–32)
Calcium: 8.9 mg/dL (ref 8.9–10.3)
Chloride: 99 mmol/L (ref 98–111)
Creatinine, Ser: 1.15 mg/dL — ABNORMAL HIGH (ref 0.44–1.00)
GFR calc Af Amer: 59 mL/min — ABNORMAL LOW (ref >60)
GFR calc non Af Amer: 51 mL/min — ABNORMAL LOW (ref >60)
Glucose, Bld: 112 mg/dL — ABNORMAL HIGH (ref 70–99)
Potassium: 3.6 mmol/L (ref 3.5–5.1)
Sodium: 137 mmol/L (ref 135–145)

## 2019-08-12 LAB — BETA-HYDROXYBUTYRIC ACID
Beta-Hydroxybutyric Acid: 2.32 mmol/L — ABNORMAL HIGH (ref 0.05–0.27)
Beta-Hydroxybutyric Acid: 1.48 mmol/L — ABNORMAL HIGH (ref 0.05–0.27)

## 2019-08-12 LAB — SARS CORONAVIRUS 2 (TAT 6-24 HRS): SARS Coronavirus 2: NEGATIVE

## 2019-08-12 LAB — LIPASE, BLOOD: Lipase: 32 U/L (ref 11–51)

## 2019-08-12 LAB — SARS CORONAVIRUS 2 BY RT PCR (HOSPITAL ORDER, PERFORMED IN ~~LOC~~ HOSPITAL LAB): SARS Coronavirus 2: NEGATIVE

## 2019-08-12 MED ORDER — SODIUM CHLORIDE 0.9 % IV BOLUS: 1000.0000 mL | Freq: Once | INTRAVENOUS | Status: DC

## 2019-08-12 MED ORDER — DEXTROSE-NACL 5-0.45 % IV SOLN
INTRAVENOUS | Status: DC
  Administered 2019-08-12: 18:00:00 via INTRAVENOUS

## 2019-08-12 MED ORDER — DEXTROSE 50 % IV SOLN: 0.0000 mL | INTRAVENOUS | Status: DC | PRN

## 2019-08-12 MED ORDER — AMANTADINE HCL 100 MG PO CAPS
100.0000 mg | ORAL_CAPSULE | Freq: Two times a day (BID) | ORAL | Status: DC
  Administered 2019-08-13 (×2): 100 mg via ORAL
  Filled 2019-08-12 (×7): qty 1

## 2019-08-12 MED ORDER — DEXTROSE-NACL 5-0.45 % IV SOLN: INTRAVENOUS | Status: DC

## 2019-08-12 MED ORDER — SODIUM CHLORIDE 0.9 % IV SOLN
INTRAVENOUS | Status: DC
  Administered 2019-08-12: 17:00:00 via INTRAVENOUS

## 2019-08-12 MED ORDER — PNEUMOCOCCAL VAC POLYVALENT 25 MCG/0.5ML IJ INJ
0.5000 mL | INJECTION | INTRAMUSCULAR | Status: AC
  Administered 2019-08-13: 12:00:00 0.5 mL via INTRAMUSCULAR
  Filled 2019-08-12: qty 0.5

## 2019-08-12 MED ORDER — CHLORHEXIDINE GLUCONATE CLOTH 2 % EX PADS: 6.0000 | MEDICATED_PAD | Freq: Every day | CUTANEOUS | Status: DC

## 2019-08-12 MED ORDER — AMLODIPINE BESYLATE 5 MG PO TABS: 5.0000 mg | ORAL_TABLET | Freq: Every day | ORAL | Status: DC

## 2019-08-12 MED ORDER — INSULIN REGULAR(HUMAN) IN NACL 100-0.9 UT/100ML-% IV SOLN
INTRAVENOUS | Status: DC
  Administered 2019-08-12: 4 [IU]/h via INTRAVENOUS
  Filled 2019-08-12: qty 100

## 2019-08-12 MED ORDER — SODIUM CHLORIDE 0.9 % IV BOLUS
1000.0000 mL | Freq: Once | INTRAVENOUS | Status: AC
  Administered 2019-08-12: 18:00:00 1000 mL via INTRAVENOUS

## 2019-08-12 MED ORDER — LAMOTRIGINE 25 MG PO TABS: 200.0000 mg | ORAL_TABLET | Freq: Two times a day (BID) | ORAL | Status: DC

## 2019-08-12 MED ORDER — LEVETIRACETAM 500 MG PO TABS
500.0000 mg | ORAL_TABLET | Freq: Every day | ORAL | Status: DC
  Administered 2019-08-13: 500 mg via ORAL
  Filled 2019-08-12: qty 1

## 2019-08-12 MED ORDER — PANTOPRAZOLE SODIUM 40 MG PO TBEC
40.0000 mg | DELAYED_RELEASE_TABLET | Freq: Every day | ORAL | Status: DC
  Filled 2019-08-12: qty 1

## 2019-08-12 MED ORDER — SODIUM CHLORIDE 0.9 % IV SOLN
1.0000 g | INTRAVENOUS | Status: DC
  Administered 2019-08-12: 20:00:00 1 g via INTRAVENOUS
  Filled 2019-08-12: qty 10

## 2019-08-12 MED ORDER — INSULIN REGULAR(HUMAN) IN NACL 100-0.9 UT/100ML-% IV SOLN: INTRAVENOUS | Status: DC

## 2019-08-12 MED ORDER — SODIUM CHLORIDE 0.9 % IV SOLN: INTRAVENOUS | Status: DC

## 2019-08-12 MED ORDER — TRAZODONE HCL 50 MG PO TABS
100.0000 mg | ORAL_TABLET | Freq: Every day | ORAL | Status: DC
  Administered 2019-08-13: 100 mg via ORAL
  Filled 2019-08-12: qty 2

## 2019-08-12 MED ORDER — MECLIZINE HCL 12.5 MG PO TABS
25.0000 mg | ORAL_TABLET | Freq: Once | ORAL | Status: AC
  Administered 2019-08-12: 25 mg via ORAL
  Filled 2019-08-12: qty 2

## 2019-08-12 MED ORDER — ATORVASTATIN CALCIUM 10 MG PO TABS: 10.0000 mg | ORAL_TABLET | ORAL | Status: DC

## 2019-08-12 MED ORDER — ENOXAPARIN SODIUM 40 MG/0.4ML ~~LOC~~ SOLN
40.0000 mg | SUBCUTANEOUS | Status: DC
  Administered 2019-08-13: 40 mg via SUBCUTANEOUS
  Filled 2019-08-12: qty 0.4

## 2019-08-12 MED ORDER — POTASSIUM CHLORIDE CRYS ER 20 MEQ PO TBCR
40.0000 meq | EXTENDED_RELEASE_TABLET | Freq: Once | ORAL | Status: AC
  Administered 2019-08-12: 40 meq via ORAL
  Filled 2019-08-12: qty 2

## 2019-08-12 MED ORDER — POTASSIUM CHLORIDE 10 MEQ/100ML IV SOLN
10.0000 meq | INTRAVENOUS | Status: AC
  Administered 2019-08-12 (×3): 10 meq via INTRAVENOUS
  Filled 2019-08-12 (×3): qty 100

## 2019-08-12 MED ORDER — SODIUM CHLORIDE 0.9 % IV BOLUS
1000.0000 mL | Freq: Once | INTRAVENOUS | Status: AC
  Administered 2019-08-12: 15:00:00 1000 mL via INTRAVENOUS

## 2019-08-12 MED ORDER — METOPROLOL TARTRATE 25 MG PO TABS
25.0000 mg | ORAL_TABLET | Freq: Two times a day (BID) | ORAL | Status: DC
  Administered 2019-08-13: 25 mg via ORAL
  Filled 2019-08-12 (×2): qty 1

## 2019-08-12 NOTE — ED Provider Notes (Signed)
Emergency Department Provider Note   I have reviewed the triage vital signs and the nursing notes.   HISTORY  Chief Complaint Emesis and Dizziness   HPI Linda English is a 63 y.o. female with past medical history reviewed below presents to the emergency department with nausea, vomiting, vertigo type dizziness.  Patient states that vomiting began again yesterday.  She states this is an issue she has had in the past and tried taking Zofran with no relief in symptoms.  She developed dizziness this morning and on discussing further describes it as the room spinning and moving.  She has spinning mostly with movement but is having some symptoms at rest.  She denies any change in her vision, headache, numbness/weakness.  Denies fevers, chest pain, abdominal discomfort.  Denies ringing in the ears or pain.    Past Medical History:  Diagnosis Date  . Acid reflux   . Allergic rhinitis   . Anxiety   . Arthritis    osteoarthritis  . Bipolar disorder ([name])   . Chronic headache   . Depression   . Diverticula of colon   . DM (diabetes mellitus) ([place])   . Gastric erosions   . Gastric polyps    benign   . Hemorrhoids   . Hiatal hernia   . Hypertension   . IBS (irritable bowel syndrome)   . Lichen planus   . Obstructive sleep apnea   . Panic disorder   . Parkinson's disease ([place])   . Rectal cancer ([place]) 2003   ileostomy and reversal  . Subdural hematoma ([place])   . Tubular adenoma     Patient Active Problem List   Diagnosis Date Noted  . DKA (diabetic ketoacidoses) ([place]) 08/12/2019  . Rt Sided Subdural hematoma, post-traumatic  07/02/2019  . AKI (acute kidney injury) ([place]) 06/26/2019  . ARF (acute renal failure) ([place]) 06/24/2019  . Nausea & vomiting 06/05/2019  . Diarrhea 07/27/2017  . Transient ischemic attack 08/09/2015  . Reflux esophagitis   . Gastric polyp   . Dyspepsia 06/13/2015  . Dysphagia 06/13/2015  . Incisional irritation 06/18/2013  . Wound drainage  06/18/2013  . Bipolar 1 disorder ([name]) 06/08/2012    Class: Chronic  . Anorexia 05/23/2012  . Insomnia 05/23/2012  . Weight loss 05/23/2012  . Weight loss, abnormal 04/10/2012  . Esophageal dysphagia 04/10/2012  . Nausea 09/08/2011  . GERD (gastroesophageal reflux disease) 07/14/2011  . Epigastric pain 07/14/2011  . Constipation 07/14/2011  . Knee pain 02/09/2011  . OA (osteoarthritis) of knee 02/09/2011  . Family hx of colon cancer 11/30/2010  . Bowel habit changes 11/30/2010  . OSTEOARTHRITIS, KNEE 08/05/2010  . PES PLANUS 08/05/2010  . RECTAL CANCER 10/23/2007  . Diabetes ([name]) 10/23/2007  . HYPERLIPIDEMIA 10/23/2007  . Anxiety state 10/23/2007  . PANIC DISORDER 10/23/2007  . DEPRESSION 10/23/2007  . OBSTRUCTIVE SLEEP APNEA 10/23/2007  . Essential hypertension 10/23/2007  . ALLERGIC RHINITIS 10/23/2007  . IBS 10/23/2007  . HEADACHE, CHRONIC 10/23/2007    Past Surgical History:  Procedure Laterality Date  . ABDOMINAL HYSTERECTOMY    . APPENDECTOMY    . BACK SURGERY    . CESAREAN SECTION     X  2  . CHOLECYSTECTOMY    . COLONOSCOPY  08/2007   DR Gala Romney, friable anal canal hemorrhoids, surgical anastomosis at 3cm, distal scattered tics  . COLONOSCOPY  12/15/2010   anal papilla and internal hemorrhoids/diminutive polyp in the base of the cecum. Past, tubular adenoma.. Next colonoscopy due  in April 2017.  Marland Kitchen COLONOSCOPY  10/27/2005   RMR: Anal canal hemorrhoids. Surgical anastomosis at 3 cm from the anal verge appeared normal. Few scattered distal diverticula. The residual  colonic mucosa appeared normal. I suspect the patient bled from hemorrhoids  . COLONOSCOPY N/A 01/15/2015   [id number] residual rectum and colon. next tcs 12/2019  . ESOPHAGEAL DILATION N/A 06/27/2015   Procedure: ESOPHAGEAL DILATION;  Surgeon: Daneil Dolin, MD;  Location: [facility];  Service: Endoscopy;  Laterality: N/A;  . ESOPHAGOGASTRODUODENOSCOPY  05/2002   DR ROURK, normal  .  ESOPHAGOGASTRODUODENOSCOPY  08/03/2011   RMR: small HH/ gastric polyps  . ESOPHAGOGASTRODUODENOSCOPY N/A 06/27/2015   Dr. Gala Romney: Mild erosive reflux esophagitits. Status post passage of a Maloney dilator. Hiatal hernia. Gastric polyps and abnormal gastric mucosa of doubtful clinical significane. status post biopsy, benign fundic gland polyp, negative H.pylori  . low anterior rection  2003   RECTAL CANCER  . SPINE SURGERY  2001   L5,S1 HEMILAMINOTOMY AND DISCECTOMY/DR DEATON    Allergies Shellfish allergy, Lithium, and Excedrin extra strength [aspirin-acetaminophen-caffeine]  Family History  Problem Relation Age of Onset  . Colon cancer Paternal Grandfather 43  . Colon polyps Brother   . Colon polyps Cousin     Social History Social History   Tobacco Use  . Smoking status: Never Smoker  . Smokeless tobacco: Never Used  . Tobacco comment: Never smoked  Substance Use Topics  . Alcohol use: No  . Drug use: No    Review of Systems  Constitutional: No fever/chills Eyes: No visual changes. ENT: No sore throat. Positive vertigo.  Cardiovascular: Denies chest pain. Respiratory: Denies shortness of breath. Gastrointestinal: No abdominal pain. Positive nausea and vomiting.  No diarrhea.  No constipation. Genitourinary: Negative for dysuria. Musculoskeletal: Negative for back pain. Skin: Negative for rash. Neurological: Negative for headaches, focal weakness or numbness.  10-point ROS otherwise negative.  ____________________________________________   PHYSICAL EXAM:  VITAL SIGNS: ED Triage Vitals  Enc Vitals Group     BP 08/12/19 1434 (!) 150/96     Pulse Rate 08/12/19 1434 (!) 113     Resp 08/12/19 1434 18     Temp 08/12/19 1434 97.9 F (36.6 C)     Temp Source 08/12/19 1434 Oral     SpO2 08/12/19 1434 100 %     Weight 08/12/19 1435 119 lb (54 kg)     Height 08/12/19 1435 5\' 1"  (1.549 m)   Constitutional: Alert and oriented. Well appearing and in no acute  distress. Eyes: Conjunctivae are normal.  Head: Atraumatic. Nose: No congestion/rhinnorhea. Mouth/Throat: Mucous membranes are moist.   Neck: No stridor.   Cardiovascular: Tachycardia. Good peripheral circulation. Grossly normal heart sounds.   Respiratory: Normal respiratory effort.  No retractions. Lungs CTAB. Gastrointestinal: Soft and nontender. No distention.  Musculoskeletal: No lower extremity tenderness nor edema. No gross deformities of extremities. Neurologic:  Normal speech and language. No gross focal neurologic deficits are appreciated. Tremor with movement of bilateral upper extremities. No CN deficits.  Skin:  Skin is warm, dry and intact. No rash noted.   ____________________________________________   LABS (all labs ordered are listed, but only abnormal results are displayed)  Labs Reviewed  COMPREHENSIVE METABOLIC PANEL - Abnormal; Notable for the following components:      Result Value   Potassium 2.9 (*)    Chloride 96 (*)    CO2 20 (*)    Glucose, Bld 254 (*)    BUN 36 (*)  Creatinine, Ser 1.60 (*)    GFR calc non Af Amer 34 (*)    GFR calc Af Amer 40 (*)    Anion gap 19 (*)    All other components within normal limits  URINALYSIS, ROUTINE W REFLEX MICROSCOPIC - Abnormal; Notable for the following components:   Color, Urine STRAW (*)    Glucose, UA 50 (*)    Hgb urine dipstick SMALL (*)    Ketones, ur 20 (*)    Leukocytes,Ua TRACE (*)    Bacteria, UA RARE (*)    All other components within normal limits  BETA-HYDROXYBUTYRIC ACID - Abnormal; Notable for the following components:   Beta-Hydroxybutyric Acid 2.32 (*)    All other components within normal limits  BLOOD GAS, VENOUS - Abnormal; Notable for the following components:   pCO2, Ven 38.5 (*)    pO2, Ven <31.0 (*)    All other components within normal limits  SARS CORONAVIRUS 2 (TAT 6-24 HRS)  URINE CULTURE  LIPASE, BLOOD  CBC WITH DIFFERENTIAL/PLATELET  MAGNESIUM  BETA-HYDROXYBUTYRIC ACID    HEMOGLOBIN A1C  CBG MONITORING, ED  CBG MONITORING, ED  CBG MONITORING, ED   ____________________________________________  EKG   EKG Interpretation  Date/Time:  Sunday August 12 2019 15:30:53 EST Ventricular Rate:  88 PR Interval:    QRS Duration: 98 QT Interval:  447 QTC Calculation: 541 R Axis:   47 Text Interpretation: Sinus rhythm Borderline repolarization abnormality Prolonged QT interval No STEMI Confirmed by Nanda Quinton [phone]) on 08/12/2019 4:10:15 PM       ____________________________________________  RADIOLOGY  CT Head Wo Contrast  Result Date: 08/12/2019 CLINICAL DATA:  Pt presents today for vomiting and dizziness onset last night. Pt stated multiple falls recently. Denies pain. History of parkinson's, rectal cancer, DM, HTN, Chronic headache, subdural hematoma. EXAM: CT HEAD WITHOUT CONTRAST TECHNIQUE: Contiguous axial images were obtained from the base of the skull through the vertex without intravenous contrast. COMPARISON:  CT head 07/11/2019 FINDINGS: Brain: There has been interval resolution of the previously seen right subdural hematoma. There has been resolution of the previously seen minimal midline shift. No new hemorrhage, hydrocephalus, extra-axial collection or mass effect. Global atrophy. Vascular: No hyperdense vessel or unexpected calcification. Skull: Normal. Negative for fracture or focal lesion. Sinuses/Orbits: No acute finding. Other: None. IMPRESSION: 1. Interval resolution of the previously seen right subdural hematoma and midline shift. 2. No acute intracranial abnormality. Electronically Signed   By: Audie Pinto M.D.   On: 08/12/2019 15:57    ____________________________________________   PROCEDURES  Procedure(s) performed:   Procedures  CRITICAL CARE Performed by: Margette Fast Total critical care time: 35 minutes Critical care time was exclusive of separately billable procedures and treating other patients. Critical care  was necessary to treat or prevent imminent or life-threatening deterioration. Critical care was time spent personally by me on the following activities: development of treatment plan with patient and/or surrogate as well as nursing, discussions with consultants, evaluation of patient's response to treatment, examination of patient, obtaining history from patient or surrogate, ordering and performing treatments and interventions, ordering and review of laboratory studies, ordering and review of radiographic studies, pulse oximetry and re-evaluation of patient's condition.  Nanda Quinton, MD Emergency Medicine  ____________________________________________   INITIAL IMPRESSION / ASSESSMENT AND PLAN / ED COURSE  Pertinent labs & imaging results that were available during my care of the patient were reviewed by me and considered in my medical decision making (see chart for details).  Patient presents to the emergency department for evaluation of vertigo type dizziness with nausea and vomiting.  Her lab work shows evidence of acute kidney injury with hypokalemia and mild metabolic acidosis. Question DKA so will start potassium and insulin.   Discussed patient's case with [facility] to request admission. Patient and family (if present) updated with plan. Care transferred to [facility] service.  I reviewed all nursing notes, vitals, pertinent old records, EKGs, labs, imaging (as available).  ____________________________________________  FINAL CLINICAL IMPRESSION(S) / ED DIAGNOSES  Final diagnoses:  Diabetic ketoacidosis without coma associated with type 1 diabetes mellitus (HCC)  Dehydration  Non-intractable vomiting with nausea, unspecified vomiting type     MEDICATIONS GIVEN DURING THIS VISIT:  Medications  insulin regular, human (MYXREDLIN) 100 units/ 100 mL infusion (0.2 Units/hr Intravenous Restarted 08/12/19 1907)  0.9 %  sodium chloride infusion ( Intravenous Stopped 08/12/19 1810)  dextrose 5  %-0.45 % sodium chloride infusion ( Intravenous New Bag/Given 08/12/19 1809)  dextrose 50 % solution 0-50 mL (has no administration in time range)  potassium chloride 10 mEq in 100 mL IVPB (10 mEq Intravenous New Bag/Given 08/12/19 1836)  cefTRIAXone (ROCEPHIN) 1 g in sodium chloride 0.9 % 100 mL IVPB (has no administration in time range)  sodium chloride 0.9 % bolus 1,000 mL (0 mLs Intravenous Stopped 08/12/19 1650)  meclizine (ANTIVERT) tablet 25 mg (25 mg Oral Given 08/12/19 1523)  potassium chloride SA (KLOR-CON) CR tablet 40 mEq (40 mEq Oral Given 08/12/19 1716)  sodium chloride 0.9 % bolus 1,000 mL (0 mLs Intravenous Stopped 08/12/19 1907)    Note:  This document was prepared using Dragon voice recognition software and may include unintentional dictation errors.  Nanda Quinton, MD, [facility] Emergency Medicine    Truth Wolaver, [facility], MD 08/12/19 [id number]

## 2019-08-12 NOTE — ED Notes (Signed)
Pt transported to CT ?

## 2019-08-12 NOTE — ED Triage Notes (Signed)
Pt presents today for vomiting and dizziness. Pt states it started last night. Pt denies being around anyone who was ill.

## 2019-08-12 NOTE — H&P (Addendum)
History and Physical    Linda English [id number] DOB: Jan 09, 1956 DOA: 08/12/2019  PCP: [name], MD   Patient coming from: Home  I have personally briefly reviewed patient's old medical records in [place]  Chief Complaint: Dizziness  HPI: Linda English is a 63 y.o. female with medical history significant for diabetes mellitus, depression, bipolar disorder, Parkinson's disease, rectal cancer, subdural hemorrhage.  Patient presented to the ED with complaints of dizziness that started yesterday.  She describes the room spinning.  Dizziness is transient, and is present when standing and when sitting down.  She reports this morning she could not get out of her bed because of dizziness. She reports multiple episodes of vomiting over the past 2 weeks.  Some days no vomiting, on other days up to 7 times a day.  Reports intermittent dysuria and frequency over the past week.  No diarrhea no abdominal pain.  Recent hospitalization-10/25 -11/3, right subacute traumatic subdural hemorrhage from fall at home, no intervention.  Also with acute kidney injury with intractable emesis with IV fluids.  Subsequently discharged to [facility] rehab.  ED Course: Tachycardic 123456, systolic blood pressure Q000111Q.  EKG sinus rhythm without changes.  Potassium 2.9.  Blood glucose 254, anion gap 19, serum bicarbonate 20, creatinine elevated 1.6.  Head CT shows interval resolution of the previously seen right subdural hematoma midline shift.  Started on DKA protocol in ED, 1 L bolus given, potassium supplements p.o. and IV, meclizine 25 mg x 1.  Hospitalist to admit for evaluation and management.  Review of Systems: As per HPI all other systems reviewed and negative.  Past Medical History:  Diagnosis Date  . Acid reflux   . Allergic rhinitis   . Anxiety   . Arthritis    osteoarthritis  . Bipolar disorder ([place])   . Chronic headache   . Depression   . Diverticula of colon   . DM  (diabetes mellitus) ([place])   . Gastric erosions   . Gastric polyps    benign   . Hemorrhoids   . Hiatal hernia   . Hypertension   . IBS (irritable bowel syndrome)   . Lichen planus   . Obstructive sleep apnea   . Panic disorder   . Parkinson's disease ([place])   . Rectal cancer ([place]) 2003   ileostomy and reversal  . Subdural hematoma ([place])   . Tubular adenoma     Past Surgical History:  Procedure Laterality Date  . ABDOMINAL HYSTERECTOMY    . APPENDECTOMY    . BACK SURGERY    . CESAREAN SECTION     X  2  . CHOLECYSTECTOMY    . COLONOSCOPY  08/2007   DR Gala Romney, friable anal canal hemorrhoids, surgical anastomosis at 3cm, distal scattered tics  . COLONOSCOPY  12/15/2010   anal papilla and internal hemorrhoids/diminutive polyp in the base of the cecum. Past, tubular adenoma.. Next colonoscopy due in April 2017.  Marland Kitchen COLONOSCOPY  10/27/2005   RMR: Anal canal hemorrhoids. Surgical anastomosis at 3 cm from the anal verge appeared normal. Few scattered distal diverticula. The residual  colonic mucosa appeared normal. I suspect the patient bled from hemorrhoids  . COLONOSCOPY N/A 01/15/2015   [id number] residual rectum and colon. next tcs 12/2019  . ESOPHAGEAL DILATION N/A 06/27/2015   Procedure: ESOPHAGEAL DILATION;  Surgeon: Daneil Dolin, MD;  Location: [facility];  Service: Endoscopy;  Laterality: N/A;  . ESOPHAGOGASTRODUODENOSCOPY  05/2002   DR ROURK, normal  .  ESOPHAGOGASTRODUODENOSCOPY  08/03/2011   RMR: small HH/ gastric polyps  . ESOPHAGOGASTRODUODENOSCOPY N/A 06/27/2015   Dr. Gala Romney: Mild erosive reflux esophagitits. Status post passage of a Maloney dilator. Hiatal hernia. Gastric polyps and abnormal gastric mucosa of doubtful clinical significane. status post biopsy, benign fundic gland polyp, negative H.pylori  . low anterior rection  2003   RECTAL CANCER  . SPINE SURGERY  2001   L5,S1 HEMILAMINOTOMY AND DISCECTOMY/DR DEATON     reports that she has never smoked. She has  never used smokeless tobacco. She reports that she does not drink alcohol or use drugs.  Allergies  Allergen Reactions  . Shellfish Allergy Anaphylaxis  . Lithium Nausea And Vomiting  . Excedrin Extra Strength [Aspirin-Acetaminophen-Caffeine] Other (See Comments)    Makes her nervous.    Family History  Problem Relation Age of Onset  . Colon cancer Paternal Grandfather 53  . Colon polyps Brother   . Colon polyps Cousin     Prior to Admission medications   Medication Sig Start Date End Date Taking? Authorizing Provider  ALPRAZolam Duanne Moron) 0.5 MG tablet Take 1 tablet (0.5 mg total) by mouth 3 (three) times daily as needed for anxiety or sleep. 07/03/19  Yes Gisell Buehrle, Courage, MD  Amantadine HCl 100 MG tablet Take 1 tablet by mouth 2 (two) times daily. 05/03/19  Yes [provider]  amLODipine (NORVASC) 5 MG tablet Take 1 tablet (5 mg total) by mouth daily. 07/04/19  Yes Naomia Lenderman, Courage, MD  atorvastatin (LIPITOR) 10 MG tablet Take 10 mg by mouth 3 (three) times a week.  06/15/18  Yes [provider]  Cholecalciferol (VITAMIN D3) 1.25 MG (50000 UT) CAPS Take 1 tablet by mouth once a week.  11/07/18  Yes [provider]  insulin aspart (NOVOLOG) 100 UNIT/ML injection insulin aspart (novoLOG) injection 0-9 Units 0-9 Units, Subcutaneous, 3 times daily with meals, CBG < 70: Implement Hypoglycemia Standing Orders and refer to Hypoglycemia Standing Orders sidebar report  CBG 70 - 120: 0 units CBG 121 - 150: 1 unit CBG 151 - 200: 2 units CBG 201 - 250: 3 units CBG 251 - 300: 5 units CBG 301 - 350: 7 units CBG 351 - 400: 9 units CBG > 400: 07/03/19 07/02/20 Yes Pocahontas Cohenour, Courage, MD  lamoTRIgine (LAMICTAL) 200 MG tablet Take 200 mg by mouth 2 (two) times daily.  06/11/13  Yes [provider]  levETIRAcetam (KEPPRA) 500 MG tablet Take 500 mg by mouth at bedtime.  09/15/15  Yes [provider]  metFORMIN (GLUCOPHAGE-XR) 500 MG 24 hr tablet Take 500 mg by mouth 2  (two) times daily. 06/13/12  Yes Darrol Jump, MD  metoprolol tartrate (LOPRESSOR) 25 MG tablet Take 1 tablet (25 mg total) by mouth 2 (two) times daily. 07/03/19  Yes Sharniece Gibbon, Courage, MD  nystatin (MYCOSTATIN/NYSTOP) powder Apply topically 2 (two) times daily. 07/03/19  Yes Overton Boggus, Courage, MD  ondansetron (ZOFRAN ODT) 4 MG disintegrating tablet Take 1 tablet (4 mg total) by mouth every 8 (eight) hours as needed for nausea or vomiting. 06/05/19  Yes Annitta Needs, NP  pantoprazole (PROTONIX) 40 MG tablet Take 1 tablet (40 mg total) by mouth daily. 07/03/19  Yes Teran Daughenbaugh, Courage, MD  traZODone (DESYREL) 100 MG tablet Take 1 tablet (100 mg total) by mouth at bedtime. 07/03/19  Yes Roxan Hockey, MD    Physical Exam: Vitals:   08/12/19 1434 08/12/19 1435  BP: (!) 150/96   Pulse: (!) 113   Resp: 18  Temp: 97.9 F (36.6 C)   TempSrc: Oral   SpO2: 100%   Weight:  54 kg  Height:  5\' 1"  (1.549 m)    Constitutional: NAD, calm, comfortable Vitals:   08/12/19 1434 08/12/19 1435  BP: (!) 150/96   Pulse: (!) 113   Resp: 18   Temp: 97.9 F (36.6 C)   TempSrc: Oral   SpO2: 100%   Weight:  54 kg  Height:  5\' 1"  (1.549 m)   Eyes: PERRL, lids and conjunctivae normal ENMT: Mucous membranes are dry. Posterior pharynx clear of any exudate or lesions. Neck: normal, supple, no masses, no thyromegaly Respiratory: clear to auscultation bilaterally, no wheezing, no crackles. Normal respiratory effort. No accessory muscle use.  Cardiovascular: Regular rate and rhythm, no murmurs / rubs / gallops. No extremity edema. 2+ pedal pulses.   Abdomen: no tenderness, no masses palpated. No hepatosplenomegaly. Bowel sounds positive.  Musculoskeletal: no clubbing / cyanosis. No joint deformity upper and lower extremities. Good ROM, no contractures. Normal muscle tone.  Skin: no rashes, lesions, ulcers. No induration Neurologic: Fine tremors present, CN 2-12 grossly intact. Sensation intact, DTR normal.  Strength 5/5 in all 4.  Psychiatric: Normal judgment and insight. Alert and oriented x 3. Normal mood.   Labs on Admission: I have personally reviewed following labs and imaging studies  CBC: Recent Labs  Lab 08/12/19 1518  WBC 7.0  NEUTROABS 5.4  HGB 12.2  HCT 37.9  MCV 85.0  PLT [id number]   Basic Metabolic Panel: Recent Labs  Lab 08/12/19 1518  NA 135  K 2.9*  CL 96*  CO2 20*  GLUCOSE 254*  BUN 36*  CREATININE 1.60*  CALCIUM 9.9   Liver Function Tests: Recent Labs  Lab 08/12/19 1518  AST 34  ALT 28  ALKPHOS 94  BILITOT 0.5  PROT 7.8  ALBUMIN 4.6   Recent Labs  Lab 08/12/19 1518  LIPASE 32    Radiological Exams on Admission: CT Head Wo Contrast  Result Date: 08/12/2019 CLINICAL DATA:  Pt presents today for vomiting and dizziness onset last night. Pt stated multiple falls recently. Denies pain. History of parkinson's, rectal cancer, DM, HTN, Chronic headache, subdural hematoma. EXAM: CT HEAD WITHOUT CONTRAST TECHNIQUE: Contiguous axial images were obtained from the base of the skull through the vertex without intravenous contrast. COMPARISON:  CT head 07/11/2019 FINDINGS: Brain: There has been interval resolution of the previously seen right subdural hematoma. There has been resolution of the previously seen minimal midline shift. No new hemorrhage, hydrocephalus, extra-axial collection or mass effect. Global atrophy. Vascular: No hyperdense vessel or unexpected calcification. Skull: Normal. Negative for fracture or focal lesion. Sinuses/Orbits: No acute finding. Other: None. IMPRESSION: 1. Interval resolution of the previously seen right subdural hematoma and midline shift. 2. No acute intracranial abnormality. Electronically Signed   By: Audie Pinto M.D.   On: 08/12/2019 15:57    EKG: Independently reviewed.  Sinus rhythm, rate 88, QTC 541.  No significant ST or T wave changes compared to prior EKG.  Assessment/Plan Active Problems:   DKA (diabetic  ketoacidoses) (HCC)   DKA-early/mild, Anion gap 19, serum bicarb 20, blood glucose not significantly elevated at 254.  Vomiting likely secondary to DKA.  Venous blood gas showed pH of 7.4, PCO2 of 38.  Elevated beta butyrate acid at 2.32.  Patient is on Metformin.  She reports she barely takes any insulin unless her blood glucose is very high.  She reports compliance with metformin.  -Insulin GTT per  DKA protocol -Serial BMP -Hold home sliding scale insulin and Metformin. -Phenergan as needed - Hgba1c -1 L bolus given, give additional 1 L bolus, then continue maintenance fluids.  Hypokalemia-2.9. -Magnesium, replete K  Acute kidney injury-creatinine 1.6, baseline 0.9-1.  Likely prerenal vomiting, poor p.o. intake in the setting of mild DKA.  AKI likely contributing to anion gap metabolic acidosis. - fluid bolus given continue maintenance fluids at 100cc/hr  Urinary symptoms- UA not quite convincing for UTI with trace leuks, rare bacteria. WBC 7. - Obtain urine cultures - Iv ceftriazone  Prolonged QTC-541.  Likely from hypokalemia. - check Magnesium -EKG a.m.  Vertigo -likely from metabolic derangements.  Head CT shows interval resolution of the previously seen right subdural hematoma and midline shift.  No acute intracranial normality. -If no improvements after treating metabolic abnormalities consider MRI brain.  History of Parkinson's disease, recent subdural hemorrhage-head CT today shows resolution of subdural hematoma. Follows with Dr. Merlene Laughter.  Tells me she is on Keppra for possible seizure, and on lamotrigine for another indication she is not aware of.  Reports compliance with both. -Resume home amantadine, lamotrigine, Keppra, as needed Xanax  Hypertension-systolic Q000111Q.  Benazepril and chlorthalidone discontinued on recent hospitalization. -Resume home Norvasc, metoprolol.  Depression-stable. -Resume home Xanax, trazodone.  Dyslipidemia -Resume home statins  Rectal  cancer s/p resection in 2003. Colonoscopy May 2016 with normal residual rectum and colon   DVT prophylaxis: Lovenox Code Status: Full code Family Communication: None at bedside Disposition Plan: Per rounding team Consults called: None Admission status: Inpatient, stepdown I certify that at the point of admission it is my clinical judgment that the patient will require inpatient hospital care spanning beyond 2 midnights from the point of admission due to high intensity of service, high risk for further deterioration and high frequency of surveillance required. The following factors support the patient status of inpatient: DKA requiring insulin GTT and hence stepdown level of care.   Bethena Roys MD [facility]  08/12/2019, 6:15 PM

## 2019-08-13 ENCOUNTER — Inpatient Hospital Stay (HOSPITAL_COMMUNITY): Payer: Medicare Other

## 2019-08-13 DIAGNOSIS — R42 Dizziness and giddiness: Secondary | ICD-10-CM

## 2019-08-13 DIAGNOSIS — E1310 Other specified diabetes mellitus with ketoacidosis without coma: Secondary | ICD-10-CM | POA: Diagnosis not present

## 2019-08-13 DIAGNOSIS — I69398 Other sequelae of cerebral infarction: Secondary | ICD-10-CM | POA: Diagnosis not present

## 2019-08-13 DIAGNOSIS — E1010 Type 1 diabetes mellitus with ketoacidosis without coma: Secondary | ICD-10-CM | POA: Diagnosis not present

## 2019-08-13 LAB — BASIC METABOLIC PANEL
Anion gap: 9 (ref 5–15)
BUN: 20 mg/dL (ref 8–23)
CO2: 23 mmol/L (ref 22–32)
Calcium: 9 mg/dL (ref 8.9–10.3)
Chloride: 107 mmol/L (ref 98–111)
Creatinine, Ser: 1.01 mg/dL — ABNORMAL HIGH (ref 0.44–1.00)
GFR calc Af Amer: >60 mL/min (ref >60)
GFR calc non Af Amer: 60 mL/min — ABNORMAL LOW (ref >60)
Glucose, Bld: 95 mg/dL (ref 70–99)
Potassium: 3.9 mmol/L (ref 3.5–5.1)
Sodium: 139 mmol/L (ref 135–145)

## 2019-08-13 LAB — HEMOGLOBIN A1C
Hgb A1c MFr Bld: 5.7 % — ABNORMAL HIGH (ref 4.8–5.6)
Mean Plasma Glucose: 116.89 mg/dL

## 2019-08-13 LAB — CBG MONITORING, ED
Glucose-Capillary: 64 mg/dL — ABNORMAL LOW (ref 70–99)
Glucose-Capillary: 84 mg/dL (ref 70–99)
Glucose-Capillary: 88 mg/dL (ref 70–99)
Glucose-Capillary: 91 mg/dL (ref 70–99)

## 2019-08-13 MED ORDER — AMLODIPINE BESYLATE 5 MG PO TABS
10.0000 mg | ORAL_TABLET | Freq: Every day | ORAL | Status: DC
  Administered 2019-08-13: 10:00:00 10 mg via ORAL
  Filled 2019-08-13: qty 2

## 2019-08-13 MED ORDER — SODIUM CHLORIDE 0.9 % IV SOLN: INTRAVENOUS | Status: DC

## 2019-08-13 MED ORDER — LORAZEPAM 2 MG/ML IJ SOLN: 0.5000 mg | Freq: Once | INTRAMUSCULAR | Status: DC | PRN

## 2019-08-13 MED ORDER — INSULIN ASPART 100 UNIT/ML ~~LOC~~ SOLN: 0.0000 [IU] | Freq: Three times a day (TID) | SUBCUTANEOUS | Status: DC

## 2019-08-13 MED ORDER — INSULIN GLARGINE 100 UNIT/ML ~~LOC~~ SOLN
10.0000 [IU] | Freq: Every day | SUBCUTANEOUS | Status: DC
  Filled 2019-08-13 (×2): qty 0.1

## 2019-08-13 NOTE — Discharge Summary (Addendum)
Physician Discharge Summary  Linda English [id number] DOB: 01-Jul-1956 DOA: 08/12/2019  PCP: [name], MD Neurology: Merlene Laughter   Admit date: 08/12/2019 Discharge date: 08/13/2019  Admitted From: Home  Disposition: Home   Recommendations for Outpatient Follow-up:  1. Follow up with PCP in 1 weeks 2. Follow up with neurologist in 1-2 weeks 3. Please check BMP in 1 week  Discharge Condition: STABLE   CODE STATUS: FULL    Brief Hospitalization Summary: Please see all hospital notes, images, labs for full details of the hospitalization. Dr. Talmadge Coventry HPI: Linda English is a 63 y.o. female with medical history significant for diabetes mellitus, depression, bipolar disorder, Parkinson's disease, rectal cancer, subdural hemorrhage.  Patient presented to the ED with complaints of dizziness that started yesterday.  She describes the room spinning.  Dizziness is transient, and is present when standing and when sitting down.  She reports this morning she could not get out of her bed because of dizziness. She reports multiple episodes of vomiting over the past 2 weeks.  Some days no vomiting, on other days up to 7 times a day.  Reports intermittent dysuria and frequency over the past week.  No diarrhea no abdominal pain.  Recent hospitalization-10/25 -11/3, right subacute traumatic subdural hemorrhage from fall at home, no intervention.  Also with acute kidney injury with intractable emesis with IV fluids.  Subsequently discharged to [facility] rehab.  ED Course: Tachycardic 123456, systolic blood pressure [id number].  EKG sinus rhythm without changes.  Potassium 2.9.  Blood glucose 254, anion gap 19, serum bicarbonate 20, creatinine elevated 1.6.  Head CT shows interval resolution of the previously seen right subdural hematoma midline shift.  Started on DKA protocol in ED, 1 L bolus given, potassium supplements p.o. and IV, meclizine 25 mg x 1.  Hospitalist to admit for evaluation and  management.   The patient was admitted with early DKA and dizziness with hypokalemia and AKI and was noted to be mildly dehydrated.  She was treated with brief course of IV insulin and IV fluids and responded very well.  She was sent for MRI brain given history of subdural hematoma and complaints of dizziness. MRI brain did not show any acute findings.  Pt says she is feeling much better and wants to go home.  She has follow up with her PCP and she was encouraged to follow up with her neurologist.  She is stable to DC home.    Discharge Diagnoses:  Active Problems:   DKA (diabetic ketoacidoses) (HCC)   Vertigo as late effect of stroke   Dizziness   Discharge Instructions:  Allergies as of 08/13/2019      Reactions   Shellfish Allergy Anaphylaxis   Lithium Nausea And Vomiting   Excedrin Extra Strength [aspirin-acetaminophen-caffeine] Other (See Comments)   Makes her nervous.      Medication List    TAKE these medications   ALPRAZolam 0.5 MG tablet Commonly known as: XANAX Take 1 tablet (0.5 mg total) by mouth 3 (three) times daily as needed for anxiety or sleep.   Amantadine HCl 100 MG tablet Take 1 tablet by mouth 2 (two) times daily.   amLODipine 5 MG tablet Commonly known as: NORVASC Take 1 tablet (5 mg total) by mouth daily.   atorvastatin 10 MG tablet Commonly known as: LIPITOR Take 10 mg by mouth 3 (three) times a week.   insulin aspart 100 UNIT/ML injection Commonly known as: NovoLOG insulin aspart (novoLOG) injection 0-9 Units 0-9 Units, Subcutaneous,  3 times daily with meals, CBG < 70: Implement Hypoglycemia Standing Orders and refer to Hypoglycemia Standing Orders sidebar report  CBG 70 - 120: 0 units CBG 121 - 150: 1 unit CBG 151 - 200: 2 units CBG 201 - 250: 3 units CBG 251 - 300: 5 units CBG 301 - 350: 7 units CBG 351 - 400: 9 units CBG > 400:   lamoTRIgine 200 MG tablet Commonly known as: LAMICTAL Take 200 mg by mouth 2 (two) times daily.   levETIRAcetam  500 MG tablet Commonly known as: KEPPRA Take 500 mg by mouth at bedtime.   metFORMIN 500 MG 24 hr tablet Commonly known as: GLUCOPHAGE-XR Take 500 mg by mouth 2 (two) times daily.   metoprolol tartrate 25 MG tablet Commonly known as: LOPRESSOR Take 1 tablet (25 mg total) by mouth 2 (two) times daily.   nystatin powder Commonly known as: MYCOSTATIN/NYSTOP Apply topically 2 (two) times daily.   ondansetron 4 MG disintegrating tablet Commonly known as: Zofran ODT Take 1 tablet (4 mg total) by mouth every 8 (eight) hours as needed for nausea or vomiting.   pantoprazole 40 MG tablet Commonly known as: PROTONIX Take 1 tablet (40 mg total) by mouth daily.   traZODone 100 MG tablet Commonly known as: DESYREL Take 1 tablet (100 mg total) by mouth at bedtime.   Vitamin D3 1.25 MG (50000 UT) Caps Take 1 tablet by mouth once a week.      Follow-up Information    [name], MD. Schedule an appointment as soon as possible for a visit in 1 week(s).   Specialty: Internal Medicine Why: Hospital Follow Up  Contact information: [address] [place] [address] [phone]        Phillips Odor, MD. Schedule an appointment as soon as possible for a visit in 1 week(s).   Specialty: Neurology Why: Hospital Follow Up dizziness  Contact information: [address] [place] [address] [phone]          Allergies  Allergen Reactions  . Shellfish Allergy Anaphylaxis  . Lithium Nausea And Vomiting  . Excedrin Extra Strength [Aspirin-Acetaminophen-Caffeine] Other (See Comments)    Makes her nervous.   Allergies as of 08/13/2019      Reactions   Shellfish Allergy Anaphylaxis   Lithium Nausea And Vomiting   Excedrin Extra Strength [aspirin-acetaminophen-caffeine] Other (See Comments)   Makes her nervous.      Medication List    TAKE these medications   ALPRAZolam 0.5 MG tablet Commonly known as: XANAX Take 1 tablet (0.5 mg total) by mouth 3  (three) times daily as needed for anxiety or sleep.   Amantadine HCl 100 MG tablet Take 1 tablet by mouth 2 (two) times daily.   amLODipine 5 MG tablet Commonly known as: NORVASC Take 1 tablet (5 mg total) by mouth daily.   atorvastatin 10 MG tablet Commonly known as: LIPITOR Take 10 mg by mouth 3 (three) times a week.   insulin aspart 100 UNIT/ML injection Commonly known as: NovoLOG insulin aspart (novoLOG) injection 0-9 Units 0-9 Units, Subcutaneous, 3 times daily with meals, CBG < 70: Implement Hypoglycemia Standing Orders and refer to Hypoglycemia Standing Orders sidebar report  CBG 70 - 120: 0 units CBG 121 - 150: 1 unit CBG 151 - 200: 2 units CBG 201 - 250: 3 units CBG 251 - 300: 5 units CBG 301 - 350: 7 units CBG 351 - 400: 9 units CBG > 400:   lamoTRIgine 200 MG  tablet Commonly known as: LAMICTAL Take 200 mg by mouth 2 (two) times daily.   levETIRAcetam 500 MG tablet Commonly known as: KEPPRA Take 500 mg by mouth at bedtime.   metFORMIN 500 MG 24 hr tablet Commonly known as: GLUCOPHAGE-XR Take 500 mg by mouth 2 (two) times daily.   metoprolol tartrate 25 MG tablet Commonly known as: LOPRESSOR Take 1 tablet (25 mg total) by mouth 2 (two) times daily.   nystatin powder Commonly known as: MYCOSTATIN/NYSTOP Apply topically 2 (two) times daily.   ondansetron 4 MG disintegrating tablet Commonly known as: Zofran ODT Take 1 tablet (4 mg total) by mouth every 8 (eight) hours as needed for nausea or vomiting.   pantoprazole 40 MG tablet Commonly known as: PROTONIX Take 1 tablet (40 mg total) by mouth daily.   traZODone 100 MG tablet Commonly known as: DESYREL Take 1 tablet (100 mg total) by mouth at bedtime.   Vitamin D3 1.25 MG (50000 UT) Caps Take 1 tablet by mouth once a week.       Procedures/Studies: CT Head Wo Contrast  Result Date: 08/12/2019 CLINICAL DATA:  Pt presents today for vomiting and dizziness onset last night. Pt stated multiple falls  recently. Denies pain. History of parkinson's, rectal cancer, DM, HTN, Chronic headache, subdural hematoma. EXAM: CT HEAD WITHOUT CONTRAST TECHNIQUE: Contiguous axial images were obtained from the base of the skull through the vertex without intravenous contrast. COMPARISON:  CT head 07/11/2019 FINDINGS: Brain: There has been interval resolution of the previously seen right subdural hematoma. There has been resolution of the previously seen minimal midline shift. No new hemorrhage, hydrocephalus, extra-axial collection or mass effect. Global atrophy. Vascular: No hyperdense vessel or unexpected calcification. Skull: Normal. Negative for fracture or focal lesion. Sinuses/Orbits: No acute finding. Other: None. IMPRESSION: 1. Interval resolution of the previously seen right subdural hematoma and midline shift. 2. No acute intracranial abnormality. Electronically Signed   By: Audie Pinto M.D.   On: 08/12/2019 15:57   MR BRAIN WO CONTRAST  Result Date: 08/13/2019 CLINICAL DATA:  Neuro deficit, subacute. Altered mental status and weakness for 1 day. EXAM: MRI HEAD WITHOUT CONTRAST TECHNIQUE: Multiplanar, multiecho pulse sequences of the brain and surrounding structures were obtained without intravenous contrast. COMPARISON:  Head CT 08/12/2019, brain MRI 11/09/2018 FINDINGS: Brain: There is no evidence of acute infarct. No evidence of intracranial mass. No midline shift or extra-axial fluid collection. Small focus of FLAIR hyperintensity, SWI signal loss and diffusion weighted hyperintensity overlying the right parietal lobe, likely reflecting minimal chronic blood products from recent prior subdural hematoma. No significant white matter disease for age. Mild generalized parenchymal atrophy. Cerebral volume is normal for age. Vascular: Flow voids maintained within the proximal large arterial vessels. Skull and upper cervical spine: No focal marrow lesion Sinuses/Orbits: Visualized orbits demonstrate no acute  abnormality. No significant paranasal sinus disease or mastoid effusion. IMPRESSION: No evidence of acute intracranial abnormality. Trace chronic blood products overlying the right parietal lobe from recent prior subdural hematoma. Mild generalized parenchymal atrophy. Electronically Signed   By: Kellie Simmering DO   On: 08/13/2019 09:18     Subjective: Pt says she feels much better, she is no longer having dizziness. She wants to go home.   Discharge Exam: Vitals:   08/13/19 0800 08/13/19 1009  BP: (!) 156/77 135/70  Pulse: 74 75  Resp: 16 16  Temp: 97.8 F (36.6 C)   SpO2: 100% 100%   Vitals:   08/13/19 0700 08/13/19 0730 08/13/19  0800 08/13/19 1009  BP: (!) 152/68 (!) 165/79 (!) 156/77 135/70  Pulse:   74 75  Resp:   16 16  Temp:   97.8 F (36.6 C)   TempSrc:   Oral   SpO2:   100% 100%  Weight:      Height:       General: Pt is alert, awake, not in acute distress Cardiovascular: RRR, S1/S2 +, no rubs, no gallops Respiratory: CTA bilaterally, no wheezing, no rhonchi Abdominal: Soft, NT, ND, bowel sounds + Extremities: no edema, no cyanosis Neuro: nonfocal exam.    The results of significant diagnostics from this hospitalization (including imaging, microbiology, ancillary and laboratory) are listed below for reference.     Microbiology: Recent Results (from the past 240 hour(s))  SARS CORONAVIRUS 2 (TAT 6-24 HRS) Nasopharyngeal Nasopharyngeal Swab     Status: None   Collection Time: 08/12/19  4:18 PM   Specimen: Nasopharyngeal Swab  Result Value Ref Range Status   SARS Coronavirus 2 NEGATIVE NEGATIVE Final    Comment: (NOTE) SARS-CoV-2 target nucleic acids are NOT DETECTED. The SARS-CoV-2 RNA is generally detectable in upper and lower respiratory specimens during the acute phase of infection. Negative results do not preclude SARS-CoV-2 infection, do not rule out co-infections with other pathogens, and should not be used as the sole basis for treatment or other patient  management decisions. Negative results must be combined with clinical observations, patient history, and epidemiological information. The expected result is Negative. Fact Sheet for Patients: SugarRoll.be Fact Sheet for Healthcare Providers: https://www.woods-mathews.com/ This test is not yet approved or cleared by the [place] FDA and  has been authorized for detection and/or diagnosis of SARS-CoV-2 by FDA under an Emergency Use Authorization (EUA). This EUA will remain  in effect (meaning this test can be used) for the duration of the COVID-19 declaration under Section 56 4(b)(1) of the Act, 21 U.S.C. section 360bbb-3(b)(1), unless the authorization is terminated or revoked sooner. Performed at [facility], [place] [address] [place], [place] [address]   SARS Coronavirus 2 by RT PCR (hospital order, performed in [place] hospital lab)     Status: None   Collection Time: 08/12/19 10:08 PM  Result Value Ref Range Status   SARS Coronavirus 2 NEGATIVE NEGATIVE Final    Comment: (NOTE) SARS-CoV-2 target nucleic acids are NOT DETECTED. The SARS-CoV-2 RNA is generally detectable in upper and lower respiratory specimens during the acute phase of infection. The lowest concentration of SARS-CoV-2 viral copies this assay can detect is 250 copies / mL. A negative result does not preclude SARS-CoV-2 infection and should not be used as the sole basis for treatment or other patient management decisions.  A negative result may occur with improper specimen collection / handling, submission of specimen other than nasopharyngeal swab, presence of viral mutation(s) within the areas targeted by this assay, and inadequate number of viral copies (<250 copies / mL). A negative result must be combined with clinical observations, patient history, and epidemiological information. Fact Sheet for Patients:   StrictlyIdeas.no Fact  Sheet for Healthcare Providers: BankingDealers.co.za This test is not yet approved or cleared  by the [place] FDA and has been authorized for detection and/or diagnosis of SARS-CoV-2 by FDA under an Emergency Use Authorization (EUA).  This EUA will remain in effect (meaning this test can be used) for the duration of the COVID-19 declaration under Section 564(b)(1) of the Act, 21 U.S.C. section 360bbb-3(b)(1), unless the authorization is terminated or revoked  sooner. Performed at [facility], [address] [place], [place] [address]      Labs: BNP (last 3 results) No results for input(s): BNP in the last 8760 hours. Basic Metabolic Panel: Recent Labs  Lab 08/12/19 1518 08/12/19 1624 08/12/19 2115 08/13/19 0044  NA 135  --  137 139  K 2.9*  --  3.6 3.9  CL 96*  --  99 107  CO2 20*  --  21* 23  GLUCOSE 254*  --  112* 95  BUN 36*  --  22 20  CREATININE 1.60*  --  1.15* 1.01*  CALCIUM 9.9  --  8.9 9.0  MG  --  1.9  --   --    Liver Function Tests: Recent Labs  Lab 08/12/19 1518  AST 34  ALT 28  ALKPHOS 94  BILITOT 0.5  PROT 7.8  ALBUMIN 4.6   Recent Labs  Lab 08/12/19 1518  LIPASE 32   No results for input(s): AMMONIA in the last 168 hours. CBC: Recent Labs  Lab 08/12/19 1518  WBC 7.0  NEUTROABS 5.4  HGB 12.2  HCT 37.9  MCV 85.0  PLT 356   Cardiac Enzymes: No results for input(s): CKTOTAL, CKMB, CKMBINDEX, TROPONINI in the last 168 hours. BNP: Invalid input(s): POCBNP CBG: Recent Labs  Lab 08/12/19 2201 08/12/19 2313 08/13/19 0022 08/13/19 0757 08/13/19 1019  GLUCAP 114* 108* 84 64* 91   D-Dimer No results for input(s): DDIMER in the last 72 hours. Hgb A1c Recent Labs    08/12/19 1624  HGBA1C 5.7*   Lipid Profile No results for input(s): CHOL, HDL, LDLCALC, TRIG, CHOLHDL, LDLDIRECT in the last 72 hours. Thyroid function studies No results for input(s): TSH, T4TOTAL, T3FREE, THYROIDAB in the last 72  hours.  Invalid input(s): FREET3 Anemia work up No results for input(s): VITAMINB12, FOLATE, FERRITIN, TIBC, IRON, RETICCTPCT in the last 72 hours. Urinalysis    Component Value Date/Time   COLORURINE STRAW (A) 08/12/2019 1518   APPEARANCEUR CLEAR 08/12/2019 1518   LABSPEC 1.009 08/12/2019 1518   PHURINE 6.0 08/12/2019 1518   GLUCOSEU 50 (A) 08/12/2019 1518   HGBUR SMALL (A) 08/12/2019 1518   BILIRUBINUR NEGATIVE 08/12/2019 1518   KETONESUR 20 (A) 08/12/2019 1518   PROTEINUR NEGATIVE 08/12/2019 1518   UROBILINOGEN 1.0 06/07/2012 2238   NITRITE NEGATIVE 08/12/2019 1518   LEUKOCYTESUR TRACE (A) 08/12/2019 1518   Sepsis Labs Invalid input(s): PROCALCITONIN,  WBC,  LACTICIDVEN Microbiology Recent Results (from the past 240 hour(s))  SARS CORONAVIRUS 2 (TAT 6-24 HRS) Nasopharyngeal Nasopharyngeal Swab     Status: None   Collection Time: 08/12/19  4:18 PM   Specimen: Nasopharyngeal Swab  Result Value Ref Range Status   SARS Coronavirus 2 NEGATIVE NEGATIVE Final    Comment: (NOTE) SARS-CoV-2 target nucleic acids are NOT DETECTED. The SARS-CoV-2 RNA is generally detectable in upper and lower respiratory specimens during the acute phase of infection. Negative results do not preclude SARS-CoV-2 infection, do not rule out co-infections with other pathogens, and should not be used as the sole basis for treatment or other patient management decisions. Negative results must be combined with clinical observations, patient history, and epidemiological information. The expected result is Negative. Fact Sheet for Patients: SugarRoll.be Fact Sheet for Healthcare Providers: https://www.woods-mathews.com/ This test is not yet approved or cleared by the [place] FDA and  has been authorized for detection and/or diagnosis of SARS-CoV-2 by FDA under an Emergency Use Authorization (EUA). This EUA will remain  in  effect (meaning this test can be used)  for the duration of the COVID-19 declaration under Section 56 4(b)(1) of the Act, 21 U.S.C. section 360bbb-3(b)(1), unless the authorization is terminated or revoked sooner. Performed at [facility], [place] [address] [place], [place] [address]   SARS Coronavirus 2 by RT PCR (hospital order, performed in [place] hospital lab)     Status: None   Collection Time: 08/12/19 10:08 PM  Result Value Ref Range Status   SARS Coronavirus 2 NEGATIVE NEGATIVE Final    Comment: (NOTE) SARS-CoV-2 target nucleic acids are NOT DETECTED. The SARS-CoV-2 RNA is generally detectable in upper and lower respiratory specimens during the acute phase of infection. The lowest concentration of SARS-CoV-2 viral copies this assay can detect is 250 copies / mL. A negative result does not preclude SARS-CoV-2 infection and should not be used as the sole basis for treatment or other patient management decisions.  A negative result may occur with improper specimen collection / handling, submission of specimen other than nasopharyngeal swab, presence of viral mutation(s) within the areas targeted by this assay, and inadequate number of viral copies (<250 copies / mL). A negative result must be combined with clinical observations, patient history, and epidemiological information. Fact Sheet for Patients:   StrictlyIdeas.no Fact Sheet for Healthcare Providers: BankingDealers.co.za This test is not yet approved or cleared  by the [place] FDA and has been authorized for detection and/or diagnosis of SARS-CoV-2 by FDA under an Emergency Use Authorization (EUA).  This EUA will remain in effect (meaning this test can be used) for the duration of the COVID-19 declaration under Section 564(b)(1) of the Act, 21 U.S.C. section 360bbb-3(b)(1), unless the authorization is terminated or revoked sooner. Performed at [facility], [address], [place], [place]  [address]    Time coordinating discharge:  31 mins   SIGNED:  Irwin Brakeman, MD  [facility] 08/13/2019, 11:22 AM How to contact the [facility] Attending or Consulting provider [place] or covering provider during after hours [place], for this patient?  1. Check the care team in [facility] and look for a) attending/consulting [facility] provider listed and b) the [facility] team listed 2. Log into [email or web address] and use [facility] universal password to access. If you do not have the password, please contact the hospital operator. 3. Locate the [facility] provider you are looking for under [facility] and page to a number that you can be directly reached. 4. If you still have difficulty reaching the provider, please page the [facility] (Director on Call) for the Hospitalists listed on amion for assistance.

## 2019-08-13 NOTE — ED Notes (Signed)
Dr Wynetta Emery in to see pt.  Informed of CBG of 64 and treatment.  Given breakfast tray.  Pt possible discharge if MRI results are negative per MD.

## 2019-08-13 NOTE — ED Notes (Signed)
Given juice

## 2019-08-13 NOTE — ED Notes (Addendum)
Spoke with daughter Breean Draves [phone].  Educated on treatment for low glucose.  Instructed to call PCP for instructions on medication treatment.

## 2019-08-13 NOTE — Care Management Obs Status (Signed)
[place] NOTIFICATION   Patient Details  Name: Linda English MRN: [id number] Date of Birth: [birth year]   Medicare Observation Status Notification Given:  Yes    [place], LCSW 08/13/2019, 11:33 AM

## 2019-08-13 NOTE — Discharge Instructions (Signed)
IMPORTANT INFORMATION: PAY CLOSE ATTENTION   PHYSICIAN DISCHARGE INSTRUCTIONS  Follow with Primary care provider  Fagan, Roy, MD  and other consultants as instructed by your Hospitalist Physician  SEEK MEDICAL CARE OR RETURN TO EMERGENCY ROOM IF SYMPTOMS COME BACK, WORSEN OR NEW PROBLEM DEVELOPS   Please note: You were cared for by a hospitalist during your hospital stay. Every effort will be made to forward records to your primary care provider.  You can request that your primary care provider send for your hospital records if they have not received them.  Once you are discharged, your primary care physician will handle any further medical issues. Please note that NO REFILLS for any discharge medications will be authorized once you are discharged, as it is imperative that you return to your primary care physician (or establish a relationship with a primary care physician if you do not have one) for your post hospital discharge needs so that they can reassess your need for medications and monitor your lab values.  Please get a complete blood count and chemistry panel checked by your Primary MD at your next visit, and again as instructed by your Primary MD.  Get Medicines reviewed and adjusted: Please take all your medications with you for your next visit with your Primary MD  Laboratory/radiological data: Please request your Primary MD to go over all hospital tests and procedure/radiological results at the follow up, please ask your primary care provider to get all Hospital records sent to his/her office.  In some cases, they will be blood work, cultures and biopsy results pending at the time of your discharge. Please request that your primary care provider follow up on these results.  If you are diabetic, please bring your blood sugar readings with you to your follow up appointment with primary care.    Please call and make your follow up appointments as soon as possible.    Also Note the  following: If you experience worsening of your admission symptoms, develop shortness of breath, life threatening emergency, suicidal or homicidal thoughts you must seek medical attention immediately by calling 911 or calling your MD immediately  if symptoms less severe.  You must read complete instructions/literature along with all the possible adverse reactions/side effects for all the Medicines you take and that have been prescribed to you. Take any new Medicines after you have completely understood and accpet all the possible adverse reactions/side effects.   Do not drive when taking Pain medications or sleeping medications (Benzodiazepines)  Do not take more than prescribed Pain, Sleep and Anxiety Medications. It is not advisable to combine anxiety,sleep and pain medications without talking with your primary care practitioner  Special Instructions: If you have smoked or chewed Tobacco  in the last 2 yrs please stop smoking, stop any regular Alcohol  and or any Recreational drug use.  Wear Seat belts while driving.  Do not drive if taking any narcotic, mind altering or controlled substances or recreational drugs or alcohol.       

## 2019-08-13 NOTE — Care Management CC44 (Signed)
Condition Code 44 Documentation Completed  Patient Details  Name: Linda English MRN: [id number] Date of Birth: 04-01-1956   Condition Code 44 given:  Yes Patient signature on Condition Code 44 notice:  Yes Documentation of 2 MD's agreement:  Yes Code 44 added to claim:  Yes    [place], LCSW 08/13/2019, 11:33 AM

## 2019-08-14 LAB — URINE CULTURE

## 2019-08-16 DIAGNOSIS — E1310 Other specified diabetes mellitus with ketoacidosis without coma: Secondary | ICD-10-CM | POA: Diagnosis not present

## 2019-08-16 DIAGNOSIS — I6200 Nontraumatic subdural hemorrhage, unspecified: Secondary | ICD-10-CM | POA: Diagnosis not present

## 2019-08-17 DIAGNOSIS — K219 Gastro-esophageal reflux disease without esophagitis: Secondary | ICD-10-CM | POA: Diagnosis not present

## 2019-08-17 DIAGNOSIS — E119 Type 2 diabetes mellitus without complications: Secondary | ICD-10-CM | POA: Diagnosis not present

## 2019-08-17 DIAGNOSIS — K589 Irritable bowel syndrome without diarrhea: Secondary | ICD-10-CM | POA: Diagnosis not present

## 2019-08-17 DIAGNOSIS — S065X0D Traumatic subdural hemorrhage without loss of consciousness, subsequent encounter: Secondary | ICD-10-CM | POA: Diagnosis not present

## 2019-08-17 DIAGNOSIS — Z85048 Personal history of other malignant neoplasm of rectum, rectosigmoid junction, and anus: Secondary | ICD-10-CM | POA: Diagnosis not present

## 2019-08-17 DIAGNOSIS — I10 Essential (primary) hypertension: Secondary | ICD-10-CM | POA: Diagnosis not present

## 2019-08-17 DIAGNOSIS — Z9181 History of falling: Secondary | ICD-10-CM | POA: Diagnosis not present

## 2019-08-17 DIAGNOSIS — Z7984 Long term (current) use of oral hypoglycemic drugs: Secondary | ICD-10-CM | POA: Diagnosis not present

## 2019-08-17 DIAGNOSIS — G4730 Sleep apnea, unspecified: Secondary | ICD-10-CM | POA: Diagnosis not present

## 2019-08-17 DIAGNOSIS — M158 Other polyosteoarthritis: Secondary | ICD-10-CM | POA: Diagnosis not present

## 2019-08-17 DIAGNOSIS — G20 Parkinson's disease: Secondary | ICD-10-CM | POA: Diagnosis not present

## 2019-08-21 DIAGNOSIS — I739 Peripheral vascular disease, unspecified: Secondary | ICD-10-CM | POA: Diagnosis not present

## 2019-08-21 DIAGNOSIS — L110 Acquired keratosis follicularis: Secondary | ICD-10-CM | POA: Diagnosis not present

## 2019-08-21 DIAGNOSIS — E1140 Type 2 diabetes mellitus with diabetic neuropathy, unspecified: Secondary | ICD-10-CM | POA: Diagnosis not present

## 2019-08-21 DIAGNOSIS — M79671 Pain in right foot: Secondary | ICD-10-CM | POA: Diagnosis not present

## 2019-08-21 DIAGNOSIS — M79672 Pain in left foot: Secondary | ICD-10-CM | POA: Diagnosis not present

## 2019-08-27 DIAGNOSIS — I10 Essential (primary) hypertension: Secondary | ICD-10-CM | POA: Diagnosis not present

## 2019-08-27 DIAGNOSIS — E119 Type 2 diabetes mellitus without complications: Secondary | ICD-10-CM | POA: Diagnosis not present

## 2019-08-27 DIAGNOSIS — G20 Parkinson's disease: Secondary | ICD-10-CM | POA: Diagnosis not present

## 2019-08-27 DIAGNOSIS — K589 Irritable bowel syndrome without diarrhea: Secondary | ICD-10-CM | POA: Diagnosis not present

## 2019-08-27 DIAGNOSIS — G4730 Sleep apnea, unspecified: Secondary | ICD-10-CM | POA: Diagnosis not present

## 2019-08-27 DIAGNOSIS — S065X0D Traumatic subdural hemorrhage without loss of consciousness, subsequent encounter: Secondary | ICD-10-CM | POA: Diagnosis not present

## 2019-08-27 DIAGNOSIS — Z9181 History of falling: Secondary | ICD-10-CM | POA: Diagnosis not present

## 2019-08-27 DIAGNOSIS — Z85048 Personal history of other malignant neoplasm of rectum, rectosigmoid junction, and anus: Secondary | ICD-10-CM | POA: Diagnosis not present

## 2019-08-27 DIAGNOSIS — K219 Gastro-esophageal reflux disease without esophagitis: Secondary | ICD-10-CM | POA: Diagnosis not present

## 2019-08-27 DIAGNOSIS — M158 Other polyosteoarthritis: Secondary | ICD-10-CM | POA: Diagnosis not present

## 2019-08-27 DIAGNOSIS — Z7984 Long term (current) use of oral hypoglycemic drugs: Secondary | ICD-10-CM | POA: Diagnosis not present

## 2019-08-28 DIAGNOSIS — G2111 Neuroleptic induced parkinsonism: Secondary | ICD-10-CM | POA: Diagnosis not present

## 2019-08-28 DIAGNOSIS — R296 Repeated falls: Secondary | ICD-10-CM | POA: Diagnosis not present

## 2019-08-28 DIAGNOSIS — I952 Hypotension due to drugs: Secondary | ICD-10-CM | POA: Diagnosis not present

## 2019-08-28 DIAGNOSIS — M542 Cervicalgia: Secondary | ICD-10-CM | POA: Diagnosis not present

## 2019-08-29 DIAGNOSIS — K589 Irritable bowel syndrome without diarrhea: Secondary | ICD-10-CM | POA: Diagnosis not present

## 2019-08-29 DIAGNOSIS — Z85048 Personal history of other malignant neoplasm of rectum, rectosigmoid junction, and anus: Secondary | ICD-10-CM | POA: Diagnosis not present

## 2019-08-29 DIAGNOSIS — G4730 Sleep apnea, unspecified: Secondary | ICD-10-CM | POA: Diagnosis not present

## 2019-08-29 DIAGNOSIS — M158 Other polyosteoarthritis: Secondary | ICD-10-CM | POA: Diagnosis not present

## 2019-08-29 DIAGNOSIS — Z7984 Long term (current) use of oral hypoglycemic drugs: Secondary | ICD-10-CM | POA: Diagnosis not present

## 2019-08-29 DIAGNOSIS — K219 Gastro-esophageal reflux disease without esophagitis: Secondary | ICD-10-CM | POA: Diagnosis not present

## 2019-08-29 DIAGNOSIS — E119 Type 2 diabetes mellitus without complications: Secondary | ICD-10-CM | POA: Diagnosis not present

## 2019-08-29 DIAGNOSIS — G20 Parkinson's disease: Secondary | ICD-10-CM | POA: Diagnosis not present

## 2019-08-29 DIAGNOSIS — Z9181 History of falling: Secondary | ICD-10-CM | POA: Diagnosis not present

## 2019-08-29 DIAGNOSIS — S065X0D Traumatic subdural hemorrhage without loss of consciousness, subsequent encounter: Secondary | ICD-10-CM | POA: Diagnosis not present

## 2019-08-29 DIAGNOSIS — I10 Essential (primary) hypertension: Secondary | ICD-10-CM | POA: Diagnosis not present

## 2019-08-30 DIAGNOSIS — E119 Type 2 diabetes mellitus without complications: Secondary | ICD-10-CM | POA: Diagnosis not present

## 2019-08-30 DIAGNOSIS — G4730 Sleep apnea, unspecified: Secondary | ICD-10-CM | POA: Diagnosis not present

## 2019-08-30 DIAGNOSIS — Z85048 Personal history of other malignant neoplasm of rectum, rectosigmoid junction, and anus: Secondary | ICD-10-CM | POA: Diagnosis not present

## 2019-08-30 DIAGNOSIS — S065X0D Traumatic subdural hemorrhage without loss of consciousness, subsequent encounter: Secondary | ICD-10-CM | POA: Diagnosis not present

## 2019-08-30 DIAGNOSIS — K219 Gastro-esophageal reflux disease without esophagitis: Secondary | ICD-10-CM | POA: Diagnosis not present

## 2019-08-30 DIAGNOSIS — Z7984 Long term (current) use of oral hypoglycemic drugs: Secondary | ICD-10-CM | POA: Diagnosis not present

## 2019-08-30 DIAGNOSIS — M158 Other polyosteoarthritis: Secondary | ICD-10-CM | POA: Diagnosis not present

## 2019-08-30 DIAGNOSIS — G20 Parkinson's disease: Secondary | ICD-10-CM | POA: Diagnosis not present

## 2019-08-30 DIAGNOSIS — Z9181 History of falling: Secondary | ICD-10-CM | POA: Diagnosis not present

## 2019-08-30 DIAGNOSIS — K589 Irritable bowel syndrome without diarrhea: Secondary | ICD-10-CM | POA: Diagnosis not present

## 2019-08-30 DIAGNOSIS — I10 Essential (primary) hypertension: Secondary | ICD-10-CM | POA: Diagnosis not present

## 2019-08-31 DIAGNOSIS — I129 Hypertensive chronic kidney disease with stage 1 through stage 4 chronic kidney disease, or unspecified chronic kidney disease: Secondary | ICD-10-CM | POA: Insufficient documentation

## 2019-09-03 DIAGNOSIS — S065X0D Traumatic subdural hemorrhage without loss of consciousness, subsequent encounter: Secondary | ICD-10-CM | POA: Diagnosis not present

## 2019-09-03 DIAGNOSIS — G4730 Sleep apnea, unspecified: Secondary | ICD-10-CM | POA: Diagnosis not present

## 2019-09-03 DIAGNOSIS — I10 Essential (primary) hypertension: Secondary | ICD-10-CM | POA: Diagnosis not present

## 2019-09-03 DIAGNOSIS — Z7984 Long term (current) use of oral hypoglycemic drugs: Secondary | ICD-10-CM | POA: Diagnosis not present

## 2019-09-03 DIAGNOSIS — Z9181 History of falling: Secondary | ICD-10-CM | POA: Diagnosis not present

## 2019-09-03 DIAGNOSIS — K589 Irritable bowel syndrome without diarrhea: Secondary | ICD-10-CM | POA: Diagnosis not present

## 2019-09-03 DIAGNOSIS — G20 Parkinson's disease: Secondary | ICD-10-CM | POA: Diagnosis not present

## 2019-09-03 DIAGNOSIS — K219 Gastro-esophageal reflux disease without esophagitis: Secondary | ICD-10-CM | POA: Diagnosis not present

## 2019-09-03 DIAGNOSIS — E119 Type 2 diabetes mellitus without complications: Secondary | ICD-10-CM | POA: Diagnosis not present

## 2019-09-03 DIAGNOSIS — Z85048 Personal history of other malignant neoplasm of rectum, rectosigmoid junction, and anus: Secondary | ICD-10-CM | POA: Diagnosis not present

## 2019-09-03 DIAGNOSIS — M158 Other polyosteoarthritis: Secondary | ICD-10-CM | POA: Diagnosis not present

## 2019-09-04 ENCOUNTER — Other Ambulatory Visit: Payer: Self-pay

## 2019-09-04 ENCOUNTER — Encounter: Payer: Self-pay | Admitting: Gastroenterology

## 2019-09-04 ENCOUNTER — Ambulatory Visit (INDEPENDENT_AMBULATORY_CARE_PROVIDER_SITE_OTHER): Payer: Medicare Other | Admitting: Gastroenterology

## 2019-09-04 DIAGNOSIS — R112 Nausea with vomiting, unspecified: Secondary | ICD-10-CM

## 2019-09-04 DIAGNOSIS — K5900 Constipation, unspecified: Secondary | ICD-10-CM | POA: Diagnosis not present

## 2019-09-04 MED ORDER — ONDANSETRON HCL 4 MG PO TABS: 4.0000 mg | ORAL_TABLET | Freq: Three times a day (TID) | ORAL | 3 refills | Status: DC

## 2019-09-04 MED ORDER — LINACLOTIDE 72 MCG PO CAPS: 72.0000 ug | ORAL_CAPSULE | Freq: Every day | ORAL | 5 refills | Status: DC

## 2019-09-04 NOTE — H&P (View-Only) (Signed)
Primary Care Physician:  [name], MD  Primary GI: Dr. Gala Romney   Patient Location: Home   Provider Location: [facility] office   Reason for Visit: Follow-up   Persons present on the virtual encounter, with roles: NP, daughter, and patient   Total time (minutes) spent on medical discussion: 25 minutes   Due to COVID-19, visit was conducted using virtual method.  Visit was requested by patient.  Virtual Visit via Telephone Note Due to COVID-19, visit is conducted virtually and was requested by patient.   I connected with Amica H Soh on 09/06/19 at  3:00 PM EST by telephone and verified that I am speaking with the correct person using two identifiers.   I discussed the limitations, risks, security and privacy concerns of performing an evaluation and management service by telephone and the availability of in person appointments. I also discussed with the patient that there may be a patient responsible charge related to this service. The patient expressed understanding and agreed to proceed.  Chief Complaint  Patient presents with  . Constipation    BM's about every 2-3 days. no bleeding  . Gastroesophageal Reflux    f/u  . Emesis    comes/goes and at times can be all day  . Dysphagia    trouble swallowing pills     History of Present Illness: Linda English is a 64 y.o. female presenting today with a history of alternating constipation and diarrhea, rectal cancer s/p resection in 2003. Colonoscopy May 2016 with normal residual rectum and colon. Surgical anastomosis at approximately 5 cm from anal verge. 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 She underwent at least 7 sessions of biofeedback retraining.It has been quite difficult to manage bowel regimen, as medications will work for some time and then result in diarrhea.EGD in 2016 with esophagitis, s/p  dilation   In interim from last visit, hospitalized Oct 25-11/3 with right subacute traumatic subdural hemorrhage related to fall, intractable emesis. Sent to Rehab at [facility]. Hospitalized again in Dec 2020 with early DKA, acute renal injury, dehydration. Recent A1c 5.7.   Daughter present on phone with patient. Difficulty with falling. Sees Dr. Merlene Laughter. Prescribed medication for orthostatic hypotension per patient. She has also been started on erythromycin.   Constipation: BM every 2-3 days. No straining with BM. Unproductive.    GERD: Protonix BID but hadn't been taking it on an empty stomach. Vomiting almost every day. Vomits after breakfast. Able to tolerate foods sometimes after breakfast. Zofran as needed for nausea. Eats then lays down. Dysphagia with pills. Bread has a sweet taste and "turns her off". No solid food dysphagia. Regurgitating food per daughter. States food will sometimes come right back up.   Past Medical History:  Diagnosis Date  . Acid reflux   . Allergic rhinitis   . Anxiety   . Arthritis    osteoarthritis  . Bipolar disorder ([place])   . Chronic headache   . Depression   . Diverticula of colon   . DM (diabetes mellitus) ([place])   . Gastric erosions   . Gastric polyps    benign   . Hemorrhoids   . Hiatal hernia   . Hypertension   . IBS (irritable bowel syndrome)   . Lichen planus   . Obstructive sleep apnea   . Panic disorder   . Parkinson's disease ([place])   . Rectal cancer ([place]) 2003   ileostomy and  reversal  . Subdural hematoma ([place])   . Tubular adenoma      Past Surgical History:  Procedure Laterality Date  . ABDOMINAL HYSTERECTOMY    . APPENDECTOMY    . BACK SURGERY    . CESAREAN SECTION     X  2  . CHOLECYSTECTOMY    . COLONOSCOPY  08/2007   DR Gala Romney, friable anal canal hemorrhoids, surgical anastomosis at 3cm, distal scattered tics  . COLONOSCOPY  12/15/2010   anal papilla and internal hemorrhoids/diminutive polyp in the base of the cecum.  Past, tubular adenoma.. Next colonoscopy due in April 2017.  Marland Kitchen COLONOSCOPY  10/27/2005   RMR: Anal canal hemorrhoids. Surgical anastomosis at 3 cm from the anal verge appeared normal. Few scattered distal diverticula. The residual  colonic mucosa appeared normal. I suspect the patient bled from hemorrhoids  . COLONOSCOPY N/A 01/15/2015   [id number] residual rectum and colon. next tcs 12/2019  . ESOPHAGEAL DILATION N/A 06/27/2015   Procedure: ESOPHAGEAL DILATION;  Surgeon: Daneil Dolin, MD;  [facility];  Service: Endoscopy;  Laterality: N/A;  . ESOPHAGOGASTRODUODENOSCOPY  05/2002   DR ROURK, normal  . ESOPHAGOGASTRODUODENOSCOPY  08/03/2011   RMR: small HH/ gastric polyps  . ESOPHAGOGASTRODUODENOSCOPY N/A 06/27/2015   Dr. Gala Romney: Mild erosive reflux esophagitits. Status post passage of a Maloney dilator. Hiatal hernia. Gastric polyps and abnormal gastric mucosa of doubtful clinical significane. status post biopsy, benign fundic gland polyp, negative H.pylori  . low anterior rection  2003   RECTAL CANCER  . SPINE SURGERY  2001   L5,S1 HEMILAMINOTOMY AND DISCECTOMY/DR DEATON     Current Meds  Medication Sig  . ALPRAZolam (XANAX) 0.5 MG tablet Take 1 tablet (0.5 mg total) by mouth 3 (three) times daily as needed for anxiety or sleep.  . Amantadine HCl 100 MG tablet Take 1 tablet by mouth 2 (two) times daily.  Marland Kitchen amLODipine (NORVASC) 5 MG tablet Take 1 tablet (5 mg total) by mouth daily.  Marland Kitchen atorvastatin (LIPITOR) 10 MG tablet Take 10 mg by mouth 3 (three) times a week.   . Cholecalciferol (VITAMIN D3) 1.25 MG (50000 UT) CAPS Take 1 tablet by mouth once a week.   . erythromycin (E-MYCIN) 250 MG tablet Take 1 tablet by mouth 4 (four) times daily -  before meals and at bedtime.  . insulin aspart (NOVOLOG) 100 UNIT/ML injection insulin aspart (novoLOG) injection 0-9 Units 0-9 Units, Subcutaneous, 3 times daily with meals, CBG < 70: Implement Hypoglycemia Standing Orders and refer to  Hypoglycemia Standing Orders sidebar report  CBG 70 - 120: 0 units CBG 121 - 150: 1 unit CBG 151 - 200: 2 units CBG 201 - 250: 3 units CBG 251 - 300: 5 units CBG 301 - 350: 7 units CBG 351 - 400: 9 units CBG > 400:  . lamoTRIgine (LAMICTAL) 200 MG tablet Take 200 mg by mouth 2 (two) times daily.   Marland Kitchen levETIRAcetam (KEPPRA) 500 MG tablet Take 500 mg by mouth at bedtime.   . metFORMIN (GLUCOPHAGE-XR) 500 MG 24 hr tablet Take 500 mg by mouth 2 (two) times daily.  . metoprolol tartrate (LOPRESSOR) 25 MG tablet Take 1 tablet (25 mg total) by mouth 2 (two) times daily.  Marland Kitchen nystatin (MYCOSTATIN/NYSTOP) powder Apply topically 2 (two) times daily.  . ondansetron (ZOFRAN ODT) 4 MG disintegrating tablet Take 1 tablet (4 mg total) by mouth every 8 (eight) hours as needed for nausea or vomiting.  . pantoprazole (PROTONIX) 40 MG tablet  Take 40 mg by mouth 2 (two) times daily before a meal.  . traZODone (DESYREL) 100 MG tablet Take 1 tablet (100 mg total) by mouth at bedtime.     Family History  Problem Relation Age of Onset  . Colon cancer Paternal Grandfather 46  . Colon polyps Brother   . Colon polyps Cousin     Social History   Socioeconomic History  . Marital status: Divorced    Spouse name: Not on file  . Number of children: Not on file  . Years of education: Not on file  . Highest education level: Not on file  Occupational History  . Occupation: Product manager: RETIRED    Comment: [employer]  Tobacco Use  . Smoking status: Never Smoker  . Smokeless tobacco: Never Used  . Tobacco comment: Never smoked  Substance and Sexual Activity  . Alcohol use: No  . Drug use: No  . Sexual activity: Never  Other Topics Concern  . Not on file  Social History Narrative  . Not on file   Social Determinants of Health   Financial Resource Strain:   . Difficulty of Paying Living Expenses: Not on file  Food Insecurity:   . Worried About Charity fundraiser in the Last Year: Not on file  . Ran  Out of Food in the Last Year: Not on file  Transportation Needs:   . Lack of Transportation (Medical): Not on file  . Lack of Transportation (Non-Medical): Not on file  Physical Activity:   . Days of Exercise per Week: Not on file  . Minutes of Exercise per Session: Not on file  Stress:   . Feeling of Stress : Not on file  Social Connections: Unknown  . Frequency of Communication with Friends and Family: Not on file  . Frequency of Social Gatherings with Friends and Family: Once a week  . Attends Religious Services: More than 4 times per year  . Active Member of Clubs or Organizations: No  . Attends Archivist Meetings: Never  . Marital Status: Divorced       Review of Systems: Gen: see HPI CV: Denies chest pain, palpitations, syncope, peripheral edema, and claudication. Resp: Denies dyspnea at rest, cough, wheezing, coughing up blood, and pleurisy. GI: see HPI Derm: Denies rash, itching, dry skin Psych: Denies depression, anxiety, memory loss, confusion. No homicidal or suicidal ideation.  Heme: Denies bruising, bleeding, and enlarged lymph nodes.  Observations/Objective: No distress. Maintains eye contact. Daughter sitting beside patient and helpful with history.   Assessment and Plan: 64 year old female presenting with recurrent nausea and vomiting, regurgitating food, requiring hospitalization recently after fall, refractory N/V, suffering right subacute traumatic subdural hemorrhage. Shortly thereafter, she was hospitalized again with possible early DKA, acute renal injury, and dehydration. With persistent nausea and vomiting, high suspicion for delayed gastric emptying; however, she is regurgitating food and difficulty tolerating solids, and would benefit from EGD to exclude any occult etiology (last in 2016). Will maximize PPI therapy, anti-emetics, and continue with prescribed erythromycin from Neurologist. Will also obtain neuro progress notes and clearance prior  to EGD due to recent hospitalization with subdural hematoma.  Constipation: not ideally managed. Start low dose Linzess 72 mcg. Historically has been difficult to manage due to alternating constipation and diarrhea.   History of rectal cancer s/p resection in 2003: surveillance colonoscopy May 2021.     1. Obtain Neuro clearance  2. Proceed with upper endoscopy in the near future, possible  dilation with Dr. Gala Romney using PROPOFOL. The risks, benefits, and alternatives have been discussed in detail with patient. They have stated understanding and desire to proceed.   3. Continue PPI BID  4. Scheduled Zofran before meals  5. Follow-up in 2-3 months.  ADDENDUM: neuro clearance received by Dr. Merlene Laughter.    Follow Up Instructions:    I discussed the assessment and treatment plan with the patient. The patient was provided an opportunity to ask questions and all were answered. The patient agreed with the plan and demonstrated an understanding of the instructions.   The patient was advised to call back or seek an in-person evaluation if the symptoms worsen or if the condition fails to improve as anticipated.  I provided 22 minutes of virtual face-to-face time during this encounter.  Annitta Needs, PhD, ANP-BC [facility]

## 2019-09-04 NOTE — Progress Notes (Addendum)
Primary Care Physician:  [name], MD  Primary GI: Dr. Gala Romney   Patient Location: Home   Provider Location: [facility] office   Reason for Visit: Follow-up   Persons present on the virtual encounter, with roles: NP, daughter, and patient   Total time (minutes) spent on medical discussion: 25 minutes   Due to COVID-19, visit was conducted using virtual method.  Visit was requested by patient.  Virtual Visit via Telephone Note Due to COVID-19, visit is conducted virtually and was requested by patient.   I connected with Linda English on 09/06/19 at  3:00 PM EST by telephone and verified that I am speaking with the correct person using two identifiers.   I discussed the limitations, risks, security and privacy concerns of performing an evaluation and management service by telephone and the availability of in person appointments. I also discussed with the patient that there may be a patient responsible charge related to this service. The patient expressed understanding and agreed to proceed.  Chief Complaint  Patient presents with  . Constipation    BM's about every 2-3 days. no bleeding  . Gastroesophageal Reflux    f/u  . Emesis    comes/goes and at times can be all day  . Dysphagia    trouble swallowing pills     History of Present Illness: Linda English is a 64 y.o. female presenting today with a history of alternating constipation and diarrhea, rectal cancer s/p resection in 2003. Colonoscopy May 2016 with normal residual rectum and colon. Surgical anastomosis at approximately 5 cm from anal verge. 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 She underwent at least 7 sessions of biofeedback retraining.It has been quite difficult to manage bowel regimen, as medications will work for some time and then result in diarrhea.EGD in 2016 with esophagitis, s/p  dilation   In interim from last visit, hospitalized Oct 25-11/3 with right subacute traumatic subdural hemorrhage related to fall, intractable emesis. Sent to Rehab at [place]. Hospitalized again in Dec 2020 with early DKA, acute renal injury, dehydration. Recent A1c 5.7.   Daughter present on phone with patient. Difficulty with falling. Sees Dr. Merlene Laughter. Prescribed medication for orthostatic hypotension per patient. She has also been started on erythromycin.   Constipation: BM every 2-3 days. No straining with BM. Unproductive.    GERD: Protonix BID but hadn't been taking it on an empty stomach. Vomiting almost every day. Vomits after breakfast. Able to tolerate foods sometimes after breakfast. Zofran as needed for nausea. Eats then lays down. Dysphagia with pills. Bread has a sweet taste and "turns her off". No solid food dysphagia. Regurgitating food per daughter. States food will sometimes come right back up.   Past Medical History:  Diagnosis Date  . Acid reflux   . Allergic rhinitis   . Anxiety   . Arthritis    osteoarthritis  . Bipolar disorder ([place])   . Chronic headache   . Depression   . Diverticula of colon   . DM (diabetes mellitus) ([place])   . Gastric erosions   . Gastric polyps    benign   . Hemorrhoids   . Hiatal hernia   . Hypertension   . IBS (irritable bowel syndrome)   . Lichen planus   . Obstructive sleep apnea   . Panic disorder   . Parkinson's disease ([place])   . Rectal cancer ([employer]) 2003   ileostomy and  reversal  . Subdural hematoma ([place])   . Tubular adenoma      Past Surgical History:  Procedure Laterality Date  . ABDOMINAL HYSTERECTOMY    . APPENDECTOMY    . BACK SURGERY    . CESAREAN SECTION     X  2  . CHOLECYSTECTOMY    . COLONOSCOPY  08/2007   DR Gala Romney, friable anal canal hemorrhoids, surgical anastomosis at 3cm, distal scattered tics  . COLONOSCOPY  12/15/2010   anal papilla and internal hemorrhoids/diminutive polyp in the base of the cecum.  Past, tubular adenoma.. Next colonoscopy due in April 2017.  Marland Kitchen COLONOSCOPY  10/27/2005   RMR: Anal canal hemorrhoids. Surgical anastomosis at 3 cm from the anal verge appeared normal. Few scattered distal diverticula. The residual  colonic mucosa appeared normal. I suspect the patient bled from hemorrhoids  . COLONOSCOPY N/A 01/15/2015   [id number] residual rectum and colon. next tcs 12/2019  . ESOPHAGEAL DILATION N/A 06/27/2015   Procedure: ESOPHAGEAL DILATION;  Surgeon: Daneil Dolin, MD;  [facility];  Service: Endoscopy;  Laterality: N/A;  . ESOPHAGOGASTRODUODENOSCOPY  05/2002   DR ROURK, normal  . ESOPHAGOGASTRODUODENOSCOPY  08/03/2011   RMR: small HH/ gastric polyps  . ESOPHAGOGASTRODUODENOSCOPY N/A 06/27/2015   Dr. Gala Romney: Mild erosive reflux esophagitits. Status post passage of a Maloney dilator. Hiatal hernia. Gastric polyps and abnormal gastric mucosa of doubtful clinical significane. status post biopsy, benign fundic gland polyp, negative H.pylori  . low anterior rection  2003   RECTAL CANCER  . SPINE SURGERY  2001   L5,S1 HEMILAMINOTOMY AND DISCECTOMY/DR DEATON     Current Meds  Medication Sig  . ALPRAZolam (XANAX) 0.5 MG tablet Take 1 tablet (0.5 mg total) by mouth 3 (three) times daily as needed for anxiety or sleep.  . Amantadine HCl 100 MG tablet Take 1 tablet by mouth 2 (two) times daily.  Marland Kitchen amLODipine (NORVASC) 5 MG tablet Take 1 tablet (5 mg total) by mouth daily.  Marland Kitchen atorvastatin (LIPITOR) 10 MG tablet Take 10 mg by mouth 3 (three) times a week.   . Cholecalciferol (VITAMIN D3) 1.25 MG (50000 UT) CAPS Take 1 tablet by mouth once a week.   . erythromycin (E-MYCIN) 250 MG tablet Take 1 tablet by mouth 4 (four) times daily -  before meals and at bedtime.  . insulin aspart (NOVOLOG) 100 UNIT/ML injection insulin aspart (novoLOG) injection 0-9 Units 0-9 Units, Subcutaneous, 3 times daily with meals, CBG < 70: Implement Hypoglycemia Standing Orders and refer to  Hypoglycemia Standing Orders sidebar report  CBG 70 - 120: 0 units CBG 121 - 150: 1 unit CBG 151 - 200: 2 units CBG 201 - 250: 3 units CBG 251 - 300: 5 units CBG 301 - 350: 7 units CBG 351 - 400: 9 units CBG > 400:  . lamoTRIgine (LAMICTAL) 200 MG tablet Take 200 mg by mouth 2 (two) times daily.   Marland Kitchen levETIRAcetam (KEPPRA) 500 MG tablet Take 500 mg by mouth at bedtime.   . metFORMIN (GLUCOPHAGE-XR) 500 MG 24 hr tablet Take 500 mg by mouth 2 (two) times daily.  . metoprolol tartrate (LOPRESSOR) 25 MG tablet Take 1 tablet (25 mg total) by mouth 2 (two) times daily.  Marland Kitchen nystatin (MYCOSTATIN/NYSTOP) powder Apply topically 2 (two) times daily.  . ondansetron (ZOFRAN ODT) 4 MG disintegrating tablet Take 1 tablet (4 mg total) by mouth every 8 (eight) hours as needed for nausea or vomiting.  . pantoprazole (PROTONIX) 40 MG tablet  Take 40 mg by mouth 2 (two) times daily before a meal.  . traZODone (DESYREL) 100 MG tablet Take 1 tablet (100 mg total) by mouth at bedtime.     Family History  Problem Relation Age of Onset  . Colon cancer Paternal Grandfather 97  . Colon polyps Brother   . Colon polyps Cousin     Social History   Socioeconomic History  . Marital status: Divorced    Spouse name: Not on file  . Number of children: Not on file  . Years of education: Not on file  . Highest education level: Not on file  Occupational History  . Occupation: Product manager: RETIRED    Comment: [employer]  Tobacco Use  . Smoking status: Never Smoker  . Smokeless tobacco: Never Used  . Tobacco comment: Never smoked  Substance and Sexual Activity  . Alcohol use: No  . Drug use: No  . Sexual activity: Never  Other Topics Concern  . Not on file  Social History Narrative  . Not on file   Social Determinants of Health   Financial Resource Strain:   . Difficulty of Paying Living Expenses: Not on file  Food Insecurity:   . Worried About Charity fundraiser in the Last Year: Not on file  . Ran  Out of Food in the Last Year: Not on file  Transportation Needs:   . Lack of Transportation (Medical): Not on file  . Lack of Transportation (Non-Medical): Not on file  Physical Activity:   . Days of Exercise per Week: Not on file  . Minutes of Exercise per Session: Not on file  Stress:   . Feeling of Stress : Not on file  Social Connections: Unknown  . Frequency of Communication with Friends and Family: Not on file  . Frequency of Social Gatherings with Friends and Family: Once a week  . Attends Religious Services: More than 4 times per year  . Active Member of Clubs or Organizations: No  . Attends Archivist Meetings: Never  . Marital Status: Divorced       Review of Systems: Gen: see HPI CV: Denies chest pain, palpitations, syncope, peripheral edema, and claudication. Resp: Denies dyspnea at rest, cough, wheezing, coughing up blood, and pleurisy. GI: see HPI Derm: Denies rash, itching, dry skin Psych: Denies depression, anxiety, memory loss, confusion. No homicidal or suicidal ideation.  Heme: Denies bruising, bleeding, and enlarged lymph nodes.  Observations/Objective: No distress. Maintains eye contact. Daughter sitting beside patient and helpful with history.   Assessment and Plan: 64 year old female presenting with recurrent nausea and vomiting, regurgitating food, requiring hospitalization recently after fall, refractory N/V, suffering right subacute traumatic subdural hemorrhage. Shortly thereafter, she was hospitalized again with possible early DKA, acute renal injury, and dehydration. With persistent nausea and vomiting, high suspicion for delayed gastric emptying; however, she is regurgitating food and difficulty tolerating solids, and would benefit from EGD to exclude any occult etiology (last in 2016). Will maximize PPI therapy, anti-emetics, and continue with prescribed erythromycin from Neurologist. Will also obtain neuro progress notes and clearance prior  to EGD due to recent hospitalization with subdural hematoma.  Constipation: not ideally managed. Start low dose Linzess 72 mcg. Historically has been difficult to manage due to alternating constipation and diarrhea.   History of rectal cancer s/p resection in 2003: surveillance colonoscopy May 2021.     1. Obtain Neuro clearance  2. Proceed with upper endoscopy in the near future, possible  dilation with Dr. Gala Romney using PROPOFOL. The risks, benefits, and alternatives have been discussed in detail with patient. They have stated understanding and desire to proceed.   3. Continue PPI BID  4. Scheduled Zofran before meals  5. Follow-up in 2-3 months.  ADDENDUM: neuro clearance received by Dr. Merlene Laughter.    Follow Up Instructions:    I discussed the assessment and treatment plan with the patient. The patient was provided an opportunity to ask questions and all were answered. The patient agreed with the plan and demonstrated an understanding of the instructions.   The patient was advised to call back or seek an in-person evaluation if the symptoms worsen or if the condition fails to improve as anticipated.  I provided 22 minutes of virtual face-to-face time during this encounter.  Annitta Needs, PhD, ANP-BC [facility]

## 2019-09-04 NOTE — Patient Instructions (Addendum)
We are arranging an upper endoscopy with dilation by Dr. Gala Romney in the near future.  I have sent in Zofran to take before meals as needed, up to 3 times per day.  We have also sent a gastroparesis diet. It is important to stick with smaller portions, 5-6 times per day instead of large portions less frequently. Make sure you do not lay down for 2-3 hours after eating.   For constipation: start Linzess low dose 72 micrograms each morning on an empty stomach. Let us know how this works for you.   We will see you in 2-3 months!  I enjoyed seeing you again today! As you know, I value our relationship and want to provide genuine, compassionate, and quality care. I welcome your feedback. If you receive a survey regarding your visit,  I greatly appreciate you taking time to fill this out. See you next time!  Annitta Needs, PhD, ANP-BC [facility]

## 2019-09-05 ENCOUNTER — Telehealth: Payer: Self-pay

## 2019-09-05 ENCOUNTER — Encounter: Payer: Self-pay | Admitting: Internal Medicine

## 2019-09-05 DIAGNOSIS — K219 Gastro-esophageal reflux disease without esophagitis: Secondary | ICD-10-CM | POA: Diagnosis not present

## 2019-09-05 DIAGNOSIS — G20 Parkinson's disease: Secondary | ICD-10-CM | POA: Diagnosis not present

## 2019-09-05 DIAGNOSIS — E119 Type 2 diabetes mellitus without complications: Secondary | ICD-10-CM | POA: Diagnosis not present

## 2019-09-05 DIAGNOSIS — I10 Essential (primary) hypertension: Secondary | ICD-10-CM | POA: Diagnosis not present

## 2019-09-05 DIAGNOSIS — S065X0D Traumatic subdural hemorrhage without loss of consciousness, subsequent encounter: Secondary | ICD-10-CM | POA: Diagnosis not present

## 2019-09-05 DIAGNOSIS — G4730 Sleep apnea, unspecified: Secondary | ICD-10-CM | POA: Diagnosis not present

## 2019-09-05 DIAGNOSIS — K589 Irritable bowel syndrome without diarrhea: Secondary | ICD-10-CM | POA: Diagnosis not present

## 2019-09-05 DIAGNOSIS — Z85048 Personal history of other malignant neoplasm of rectum, rectosigmoid junction, and anus: Secondary | ICD-10-CM | POA: Diagnosis not present

## 2019-09-05 DIAGNOSIS — Z7984 Long term (current) use of oral hypoglycemic drugs: Secondary | ICD-10-CM | POA: Diagnosis not present

## 2019-09-05 DIAGNOSIS — Z9181 History of falling: Secondary | ICD-10-CM | POA: Diagnosis not present

## 2019-09-05 DIAGNOSIS — M158 Other polyosteoarthritis: Secondary | ICD-10-CM | POA: Diagnosis not present

## 2019-09-05 NOTE — Telephone Encounter (Signed)
Faxed letter to Dr. Merlene Laughter for clearance of EGD with RMR. Waiting on a response from Dr. Freddie Apley office.

## 2019-09-06 NOTE — Progress Notes (Signed)
Cc'ed to pcp °

## 2019-09-07 DIAGNOSIS — I10 Essential (primary) hypertension: Secondary | ICD-10-CM | POA: Diagnosis not present

## 2019-09-07 DIAGNOSIS — S065X0D Traumatic subdural hemorrhage without loss of consciousness, subsequent encounter: Secondary | ICD-10-CM | POA: Diagnosis not present

## 2019-09-07 DIAGNOSIS — Z7984 Long term (current) use of oral hypoglycemic drugs: Secondary | ICD-10-CM | POA: Diagnosis not present

## 2019-09-07 DIAGNOSIS — G4730 Sleep apnea, unspecified: Secondary | ICD-10-CM | POA: Diagnosis not present

## 2019-09-07 DIAGNOSIS — K219 Gastro-esophageal reflux disease without esophagitis: Secondary | ICD-10-CM | POA: Diagnosis not present

## 2019-09-07 DIAGNOSIS — Z9181 History of falling: Secondary | ICD-10-CM | POA: Diagnosis not present

## 2019-09-07 DIAGNOSIS — M158 Other polyosteoarthritis: Secondary | ICD-10-CM | POA: Diagnosis not present

## 2019-09-07 DIAGNOSIS — Z85048 Personal history of other malignant neoplasm of rectum, rectosigmoid junction, and anus: Secondary | ICD-10-CM | POA: Diagnosis not present

## 2019-09-07 DIAGNOSIS — G20 Parkinson's disease: Secondary | ICD-10-CM | POA: Diagnosis not present

## 2019-09-07 DIAGNOSIS — E119 Type 2 diabetes mellitus without complications: Secondary | ICD-10-CM | POA: Diagnosis not present

## 2019-09-07 DIAGNOSIS — K589 Irritable bowel syndrome without diarrhea: Secondary | ICD-10-CM | POA: Diagnosis not present

## 2019-09-10 DIAGNOSIS — M158 Other polyosteoarthritis: Secondary | ICD-10-CM | POA: Diagnosis not present

## 2019-09-10 DIAGNOSIS — I10 Essential (primary) hypertension: Secondary | ICD-10-CM | POA: Diagnosis not present

## 2019-09-10 DIAGNOSIS — Z85048 Personal history of other malignant neoplasm of rectum, rectosigmoid junction, and anus: Secondary | ICD-10-CM | POA: Diagnosis not present

## 2019-09-10 DIAGNOSIS — K219 Gastro-esophageal reflux disease without esophagitis: Secondary | ICD-10-CM | POA: Diagnosis not present

## 2019-09-10 DIAGNOSIS — S065X0D Traumatic subdural hemorrhage without loss of consciousness, subsequent encounter: Secondary | ICD-10-CM | POA: Diagnosis not present

## 2019-09-10 DIAGNOSIS — G4730 Sleep apnea, unspecified: Secondary | ICD-10-CM | POA: Diagnosis not present

## 2019-09-10 DIAGNOSIS — Z9181 History of falling: Secondary | ICD-10-CM | POA: Diagnosis not present

## 2019-09-10 DIAGNOSIS — G20 Parkinson's disease: Secondary | ICD-10-CM | POA: Diagnosis not present

## 2019-09-10 DIAGNOSIS — Z7984 Long term (current) use of oral hypoglycemic drugs: Secondary | ICD-10-CM | POA: Diagnosis not present

## 2019-09-10 DIAGNOSIS — K589 Irritable bowel syndrome without diarrhea: Secondary | ICD-10-CM | POA: Diagnosis not present

## 2019-09-10 DIAGNOSIS — E119 Type 2 diabetes mellitus without complications: Secondary | ICD-10-CM | POA: Diagnosis not present

## 2019-09-10 DIAGNOSIS — N179 Acute kidney failure, unspecified: Secondary | ICD-10-CM | POA: Diagnosis not present

## 2019-09-12 DIAGNOSIS — E119 Type 2 diabetes mellitus without complications: Secondary | ICD-10-CM | POA: Diagnosis not present

## 2019-09-12 DIAGNOSIS — Z7984 Long term (current) use of oral hypoglycemic drugs: Secondary | ICD-10-CM | POA: Diagnosis not present

## 2019-09-12 DIAGNOSIS — G20 Parkinson's disease: Secondary | ICD-10-CM | POA: Diagnosis not present

## 2019-09-12 DIAGNOSIS — S065X0D Traumatic subdural hemorrhage without loss of consciousness, subsequent encounter: Secondary | ICD-10-CM | POA: Diagnosis not present

## 2019-09-12 DIAGNOSIS — Z85048 Personal history of other malignant neoplasm of rectum, rectosigmoid junction, and anus: Secondary | ICD-10-CM | POA: Diagnosis not present

## 2019-09-12 DIAGNOSIS — K589 Irritable bowel syndrome without diarrhea: Secondary | ICD-10-CM | POA: Diagnosis not present

## 2019-09-12 DIAGNOSIS — Z9181 History of falling: Secondary | ICD-10-CM | POA: Diagnosis not present

## 2019-09-12 DIAGNOSIS — G4730 Sleep apnea, unspecified: Secondary | ICD-10-CM | POA: Diagnosis not present

## 2019-09-12 DIAGNOSIS — M158 Other polyosteoarthritis: Secondary | ICD-10-CM | POA: Diagnosis not present

## 2019-09-12 DIAGNOSIS — I10 Essential (primary) hypertension: Secondary | ICD-10-CM | POA: Diagnosis not present

## 2019-09-12 DIAGNOSIS — K219 Gastro-esophageal reflux disease without esophagitis: Secondary | ICD-10-CM | POA: Diagnosis not present

## 2019-09-14 DIAGNOSIS — I10 Essential (primary) hypertension: Secondary | ICD-10-CM | POA: Diagnosis not present

## 2019-09-14 DIAGNOSIS — M158 Other polyosteoarthritis: Secondary | ICD-10-CM | POA: Diagnosis not present

## 2019-09-14 DIAGNOSIS — Z9181 History of falling: Secondary | ICD-10-CM | POA: Diagnosis not present

## 2019-09-14 DIAGNOSIS — K589 Irritable bowel syndrome without diarrhea: Secondary | ICD-10-CM | POA: Diagnosis not present

## 2019-09-14 DIAGNOSIS — S065X0D Traumatic subdural hemorrhage without loss of consciousness, subsequent encounter: Secondary | ICD-10-CM | POA: Diagnosis not present

## 2019-09-14 DIAGNOSIS — K219 Gastro-esophageal reflux disease without esophagitis: Secondary | ICD-10-CM | POA: Diagnosis not present

## 2019-09-14 DIAGNOSIS — Z85048 Personal history of other malignant neoplasm of rectum, rectosigmoid junction, and anus: Secondary | ICD-10-CM | POA: Diagnosis not present

## 2019-09-14 DIAGNOSIS — G4730 Sleep apnea, unspecified: Secondary | ICD-10-CM | POA: Diagnosis not present

## 2019-09-14 DIAGNOSIS — E119 Type 2 diabetes mellitus without complications: Secondary | ICD-10-CM | POA: Diagnosis not present

## 2019-09-14 DIAGNOSIS — Z7984 Long term (current) use of oral hypoglycemic drugs: Secondary | ICD-10-CM | POA: Diagnosis not present

## 2019-09-14 DIAGNOSIS — G20 Parkinson's disease: Secondary | ICD-10-CM | POA: Diagnosis not present

## 2019-09-17 DIAGNOSIS — G4730 Sleep apnea, unspecified: Secondary | ICD-10-CM | POA: Diagnosis not present

## 2019-09-17 DIAGNOSIS — Z9181 History of falling: Secondary | ICD-10-CM | POA: Diagnosis not present

## 2019-09-17 DIAGNOSIS — Z7984 Long term (current) use of oral hypoglycemic drugs: Secondary | ICD-10-CM | POA: Diagnosis not present

## 2019-09-17 DIAGNOSIS — I10 Essential (primary) hypertension: Secondary | ICD-10-CM | POA: Diagnosis not present

## 2019-09-17 DIAGNOSIS — S065X0D Traumatic subdural hemorrhage without loss of consciousness, subsequent encounter: Secondary | ICD-10-CM | POA: Diagnosis not present

## 2019-09-17 DIAGNOSIS — G20 Parkinson's disease: Secondary | ICD-10-CM | POA: Diagnosis not present

## 2019-09-17 DIAGNOSIS — K219 Gastro-esophageal reflux disease without esophagitis: Secondary | ICD-10-CM | POA: Diagnosis not present

## 2019-09-17 DIAGNOSIS — M158 Other polyosteoarthritis: Secondary | ICD-10-CM | POA: Diagnosis not present

## 2019-09-17 DIAGNOSIS — E119 Type 2 diabetes mellitus without complications: Secondary | ICD-10-CM | POA: Diagnosis not present

## 2019-09-17 DIAGNOSIS — K589 Irritable bowel syndrome without diarrhea: Secondary | ICD-10-CM | POA: Diagnosis not present

## 2019-09-17 DIAGNOSIS — Z85048 Personal history of other malignant neoplasm of rectum, rectosigmoid junction, and anus: Secondary | ICD-10-CM | POA: Diagnosis not present

## 2019-09-19 DIAGNOSIS — S065X0D Traumatic subdural hemorrhage without loss of consciousness, subsequent encounter: Secondary | ICD-10-CM | POA: Diagnosis not present

## 2019-09-19 DIAGNOSIS — K589 Irritable bowel syndrome without diarrhea: Secondary | ICD-10-CM | POA: Diagnosis not present

## 2019-09-19 DIAGNOSIS — I10 Essential (primary) hypertension: Secondary | ICD-10-CM | POA: Diagnosis not present

## 2019-09-19 DIAGNOSIS — Z9181 History of falling: Secondary | ICD-10-CM | POA: Diagnosis not present

## 2019-09-19 DIAGNOSIS — E119 Type 2 diabetes mellitus without complications: Secondary | ICD-10-CM | POA: Diagnosis not present

## 2019-09-19 DIAGNOSIS — M158 Other polyosteoarthritis: Secondary | ICD-10-CM | POA: Diagnosis not present

## 2019-09-19 DIAGNOSIS — G4730 Sleep apnea, unspecified: Secondary | ICD-10-CM | POA: Diagnosis not present

## 2019-09-19 DIAGNOSIS — R531 Weakness: Secondary | ICD-10-CM | POA: Diagnosis not present

## 2019-09-19 DIAGNOSIS — R5381 Other malaise: Secondary | ICD-10-CM | POA: Diagnosis not present

## 2019-09-19 DIAGNOSIS — Z743 Need for continuous supervision: Secondary | ICD-10-CM | POA: Diagnosis not present

## 2019-09-19 DIAGNOSIS — Z85048 Personal history of other malignant neoplasm of rectum, rectosigmoid junction, and anus: Secondary | ICD-10-CM | POA: Diagnosis not present

## 2019-09-19 DIAGNOSIS — K219 Gastro-esophageal reflux disease without esophagitis: Secondary | ICD-10-CM | POA: Diagnosis not present

## 2019-09-19 DIAGNOSIS — G20 Parkinson's disease: Secondary | ICD-10-CM | POA: Diagnosis not present

## 2019-09-19 DIAGNOSIS — Z7984 Long term (current) use of oral hypoglycemic drugs: Secondary | ICD-10-CM | POA: Diagnosis not present

## 2019-09-20 DIAGNOSIS — K589 Irritable bowel syndrome without diarrhea: Secondary | ICD-10-CM | POA: Diagnosis not present

## 2019-09-20 DIAGNOSIS — Z85048 Personal history of other malignant neoplasm of rectum, rectosigmoid junction, and anus: Secondary | ICD-10-CM | POA: Diagnosis not present

## 2019-09-20 DIAGNOSIS — I10 Essential (primary) hypertension: Secondary | ICD-10-CM | POA: Diagnosis not present

## 2019-09-20 DIAGNOSIS — E119 Type 2 diabetes mellitus without complications: Secondary | ICD-10-CM | POA: Diagnosis not present

## 2019-09-20 DIAGNOSIS — G4730 Sleep apnea, unspecified: Secondary | ICD-10-CM | POA: Diagnosis not present

## 2019-09-20 DIAGNOSIS — M158 Other polyosteoarthritis: Secondary | ICD-10-CM | POA: Diagnosis not present

## 2019-09-20 DIAGNOSIS — Z7984 Long term (current) use of oral hypoglycemic drugs: Secondary | ICD-10-CM | POA: Diagnosis not present

## 2019-09-20 DIAGNOSIS — Z9181 History of falling: Secondary | ICD-10-CM | POA: Diagnosis not present

## 2019-09-20 DIAGNOSIS — G20 Parkinson's disease: Secondary | ICD-10-CM | POA: Diagnosis not present

## 2019-09-20 DIAGNOSIS — S065X0D Traumatic subdural hemorrhage without loss of consciousness, subsequent encounter: Secondary | ICD-10-CM | POA: Diagnosis not present

## 2019-09-20 DIAGNOSIS — K219 Gastro-esophageal reflux disease without esophagitis: Secondary | ICD-10-CM | POA: Diagnosis not present

## 2019-09-21 ENCOUNTER — Telehealth: Payer: Self-pay

## 2019-09-21 ENCOUNTER — Telehealth: Payer: Self-pay | Admitting: Internal Medicine

## 2019-09-21 DIAGNOSIS — K219 Gastro-esophageal reflux disease without esophagitis: Secondary | ICD-10-CM

## 2019-09-21 DIAGNOSIS — R112 Nausea with vomiting, unspecified: Secondary | ICD-10-CM

## 2019-09-21 NOTE — Telephone Encounter (Signed)
VM received from pts daughter asking our office to contact her [phone]. Lmom, waiting on a return call.

## 2019-09-21 NOTE — Telephone Encounter (Signed)
PATIENT WAS TOLD TO INCREASE HER ZOFRAN, BUT NEEDS AN UPDATED SCRIPT FOR NEW DOSE.  [facility] [address]

## 2019-09-22 DIAGNOSIS — G20 Parkinson's disease: Secondary | ICD-10-CM | POA: Diagnosis not present

## 2019-09-22 DIAGNOSIS — E119 Type 2 diabetes mellitus without complications: Secondary | ICD-10-CM | POA: Diagnosis not present

## 2019-09-22 DIAGNOSIS — S065X0D Traumatic subdural hemorrhage without loss of consciousness, subsequent encounter: Secondary | ICD-10-CM | POA: Diagnosis not present

## 2019-09-22 DIAGNOSIS — I10 Essential (primary) hypertension: Secondary | ICD-10-CM | POA: Diagnosis not present

## 2019-09-24 DIAGNOSIS — G20 Parkinson's disease: Secondary | ICD-10-CM | POA: Diagnosis not present

## 2019-09-24 DIAGNOSIS — G4730 Sleep apnea, unspecified: Secondary | ICD-10-CM | POA: Diagnosis not present

## 2019-09-24 DIAGNOSIS — Z85048 Personal history of other malignant neoplasm of rectum, rectosigmoid junction, and anus: Secondary | ICD-10-CM | POA: Diagnosis not present

## 2019-09-24 DIAGNOSIS — K219 Gastro-esophageal reflux disease without esophagitis: Secondary | ICD-10-CM | POA: Diagnosis not present

## 2019-09-24 DIAGNOSIS — K589 Irritable bowel syndrome without diarrhea: Secondary | ICD-10-CM | POA: Diagnosis not present

## 2019-09-24 DIAGNOSIS — M158 Other polyosteoarthritis: Secondary | ICD-10-CM | POA: Diagnosis not present

## 2019-09-24 DIAGNOSIS — I10 Essential (primary) hypertension: Secondary | ICD-10-CM | POA: Diagnosis not present

## 2019-09-24 DIAGNOSIS — E119 Type 2 diabetes mellitus without complications: Secondary | ICD-10-CM | POA: Diagnosis not present

## 2019-09-24 DIAGNOSIS — Z7984 Long term (current) use of oral hypoglycemic drugs: Secondary | ICD-10-CM | POA: Diagnosis not present

## 2019-09-24 DIAGNOSIS — Z9181 History of falling: Secondary | ICD-10-CM | POA: Diagnosis not present

## 2019-09-24 DIAGNOSIS — S065X0D Traumatic subdural hemorrhage without loss of consciousness, subsequent encounter: Secondary | ICD-10-CM | POA: Diagnosis not present

## 2019-09-24 NOTE — Telephone Encounter (Signed)
Pt would like a refill of Zofran 4mg  sent to her pharmacy.

## 2019-09-25 ENCOUNTER — Other Ambulatory Visit: Payer: Self-pay

## 2019-09-25 MED ORDER — ONDANSETRON HCL 4 MG PO TABS: 4.0000 mg | ORAL_TABLET | Freq: Three times a day (TID) | ORAL | 5 refills | Status: DC | PRN

## 2019-09-25 MED ORDER — ONDANSETRON HCL 4 MG PO TABS: 4.0000 mg | ORAL_TABLET | Freq: Three times a day (TID) | ORAL | 5 refills | Status: DC

## 2019-09-25 NOTE — Telephone Encounter (Signed)
Linda English, I received a call from Linda English. She is a home health nurse that was calling on pts behalf. I spoke with pts daughter earlier and she asked if pt could get a new script of Zofran and said the pharmacy and says pts medication runs out before its time to fill it again, due to pts insurance. EG sent a new RX for pt. Linda English was concerned about the nausea and wanted to know if another nausea medication could be called in so pt can have a second nausea med. Linda English also wants to know if pt can be placed on a waiting list for EGD. Linda English is aware that I will give pts daughters some [place] cards to help with the cost of pts medication. Please advise.

## 2019-09-25 NOTE — Telephone Encounter (Signed)
Updated Rx sent to pharmacy for tid ac.

## 2019-09-25 NOTE — Telephone Encounter (Addendum)
Received clearance from Dr. Merlene Laughter regarding GI procedures.   We need to get her scheduled for EGD/dilation with Propofol by Dr. Gala Romney as soon as possible due to dysphagia, refractory N/V.   Make sure taking PPI BID. Zofran 30 minutes before meals and at bedtime. If already maximizing this, let me know.

## 2019-09-25 NOTE — Telephone Encounter (Signed)
Noted. Pts daughter is aware that EGD will be scheduled. Pt and daughter also want to switch pts RX to [facility], since she can get the #90 pills for $35.99 through [facility]. Coupon mailed to pt. RX changed to [place], ok per AB.

## 2019-09-25 NOTE — Telephone Encounter (Signed)
Spoke with pts daughter. She's aware that new RX was called into pts pharmacy.

## 2019-09-25 NOTE — Addendum Note (Signed)
Addended by: Gordy Levan, Anyssa Sharpless A on: 09/25/2019 08:58 AM   Modules accepted: Orders

## 2019-09-26 ENCOUNTER — Other Ambulatory Visit: Payer: Self-pay

## 2019-09-26 DIAGNOSIS — I10 Essential (primary) hypertension: Secondary | ICD-10-CM | POA: Diagnosis not present

## 2019-09-26 DIAGNOSIS — E119 Type 2 diabetes mellitus without complications: Secondary | ICD-10-CM | POA: Diagnosis not present

## 2019-09-26 DIAGNOSIS — Z9181 History of falling: Secondary | ICD-10-CM | POA: Diagnosis not present

## 2019-09-26 DIAGNOSIS — G20 Parkinson's disease: Secondary | ICD-10-CM | POA: Diagnosis not present

## 2019-09-26 DIAGNOSIS — M542 Cervicalgia: Secondary | ICD-10-CM | POA: Diagnosis not present

## 2019-09-26 DIAGNOSIS — Z85048 Personal history of other malignant neoplasm of rectum, rectosigmoid junction, and anus: Secondary | ICD-10-CM | POA: Diagnosis not present

## 2019-09-26 DIAGNOSIS — Z7984 Long term (current) use of oral hypoglycemic drugs: Secondary | ICD-10-CM | POA: Diagnosis not present

## 2019-09-26 DIAGNOSIS — K219 Gastro-esophageal reflux disease without esophagitis: Secondary | ICD-10-CM | POA: Diagnosis not present

## 2019-09-26 DIAGNOSIS — M158 Other polyosteoarthritis: Secondary | ICD-10-CM | POA: Diagnosis not present

## 2019-09-26 DIAGNOSIS — I952 Hypotension due to drugs: Secondary | ICD-10-CM | POA: Diagnosis not present

## 2019-09-26 DIAGNOSIS — G4730 Sleep apnea, unspecified: Secondary | ICD-10-CM | POA: Diagnosis not present

## 2019-09-26 DIAGNOSIS — S065X0D Traumatic subdural hemorrhage without loss of consciousness, subsequent encounter: Secondary | ICD-10-CM | POA: Diagnosis not present

## 2019-09-26 DIAGNOSIS — R296 Repeated falls: Secondary | ICD-10-CM | POA: Diagnosis not present

## 2019-09-26 DIAGNOSIS — G2111 Neuroleptic induced parkinsonism: Secondary | ICD-10-CM | POA: Diagnosis not present

## 2019-09-26 DIAGNOSIS — K589 Irritable bowel syndrome without diarrhea: Secondary | ICD-10-CM | POA: Diagnosis not present

## 2019-09-26 NOTE — Telephone Encounter (Signed)
Called and informed daughter of pre-op and COVID test appt tomorrow. Letter and procedure instructions completed. Pt has MyChart.

## 2019-09-26 NOTE — Telephone Encounter (Signed)
Called daughter, EGD/DIL w/Propofol w/RMR scheduled for 10/01/19 at 1:15pm. Procedure instructions given on phone to daughter. Orders entered.  PA for EGD/DIL submitted via [facility] website. Case approved. PA# [id number], valid 10/01/19-12/30/19.

## 2019-09-27 ENCOUNTER — Other Ambulatory Visit: Payer: Self-pay

## 2019-09-27 ENCOUNTER — Encounter (HOSPITAL_COMMUNITY)
Admission: RE | Admit: 2019-09-27 | Discharge: 2019-09-27 | Disposition: A | Discharge status: Home / Self Care | Payer: Medicare Other | Source: Ambulatory Visit | Attending: Internal Medicine | Admitting: Internal Medicine

## 2019-09-27 ENCOUNTER — Other Ambulatory Visit (HOSPITAL_COMMUNITY)
Admission: RE | Admit: 2019-09-27 | Discharge: 2019-09-27 | Disposition: A | Discharge status: Home / Self Care | Payer: Medicare Other | Source: Ambulatory Visit | Attending: Internal Medicine | Admitting: Internal Medicine

## 2019-09-27 DIAGNOSIS — Z20822 Contact with and (suspected) exposure to covid-19: Secondary | ICD-10-CM | POA: Insufficient documentation

## 2019-09-27 DIAGNOSIS — K219 Gastro-esophageal reflux disease without esophagitis: Secondary | ICD-10-CM | POA: Diagnosis not present

## 2019-09-27 DIAGNOSIS — Z7984 Long term (current) use of oral hypoglycemic drugs: Secondary | ICD-10-CM | POA: Diagnosis not present

## 2019-09-27 DIAGNOSIS — Z85048 Personal history of other malignant neoplasm of rectum, rectosigmoid junction, and anus: Secondary | ICD-10-CM | POA: Diagnosis not present

## 2019-09-27 DIAGNOSIS — K589 Irritable bowel syndrome without diarrhea: Secondary | ICD-10-CM | POA: Diagnosis not present

## 2019-09-27 DIAGNOSIS — G20 Parkinson's disease: Secondary | ICD-10-CM | POA: Diagnosis not present

## 2019-09-27 DIAGNOSIS — M158 Other polyosteoarthritis: Secondary | ICD-10-CM | POA: Diagnosis not present

## 2019-09-27 DIAGNOSIS — E119 Type 2 diabetes mellitus without complications: Secondary | ICD-10-CM | POA: Diagnosis not present

## 2019-09-27 DIAGNOSIS — Z01812 Encounter for preprocedural laboratory examination: Secondary | ICD-10-CM | POA: Diagnosis not present

## 2019-09-27 DIAGNOSIS — N390 Urinary tract infection, site not specified: Secondary | ICD-10-CM | POA: Diagnosis not present

## 2019-09-27 DIAGNOSIS — Z9181 History of falling: Secondary | ICD-10-CM | POA: Diagnosis not present

## 2019-09-27 DIAGNOSIS — S065X0D Traumatic subdural hemorrhage without loss of consciousness, subsequent encounter: Secondary | ICD-10-CM | POA: Diagnosis not present

## 2019-09-27 DIAGNOSIS — G4730 Sleep apnea, unspecified: Secondary | ICD-10-CM | POA: Diagnosis not present

## 2019-09-27 DIAGNOSIS — D649 Anemia, unspecified: Secondary | ICD-10-CM | POA: Diagnosis not present

## 2019-09-27 DIAGNOSIS — I10 Essential (primary) hypertension: Secondary | ICD-10-CM | POA: Diagnosis not present

## 2019-09-27 LAB — SARS CORONAVIRUS 2 (TAT 6-24 HRS): SARS Coronavirus 2: NEGATIVE

## 2019-09-30 NOTE — OR Nursing (Signed)
Called Patient and her daughter could bring her around 11:15 in the am for her procedure at 12:30pm.

## 2019-10-01 ENCOUNTER — Encounter (HOSPITAL_COMMUNITY)
Admission: RE | Disposition: A | Discharge status: Home / Self Care | Payer: Self-pay | Source: Home / Self Care | Attending: Internal Medicine

## 2019-10-01 ENCOUNTER — Ambulatory Visit (HOSPITAL_COMMUNITY): Payer: Medicare Other | Admitting: Anesthesiology

## 2019-10-01 ENCOUNTER — Encounter (HOSPITAL_COMMUNITY): Payer: Self-pay | Admitting: Internal Medicine

## 2019-10-01 ENCOUNTER — Ambulatory Visit (HOSPITAL_COMMUNITY)
Admission: RE | Admit: 2019-10-01 | Discharge: 2019-10-01 | Disposition: A | Discharge status: Home / Self Care | Payer: Medicare Other | Attending: Internal Medicine | Admitting: Internal Medicine

## 2019-10-01 DIAGNOSIS — M1990 Unspecified osteoarthritis, unspecified site: Secondary | ICD-10-CM | POA: Diagnosis not present

## 2019-10-01 DIAGNOSIS — G4733 Obstructive sleep apnea (adult) (pediatric): Secondary | ICD-10-CM | POA: Diagnosis not present

## 2019-10-01 DIAGNOSIS — K317 Polyp of stomach and duodenum: Secondary | ICD-10-CM | POA: Diagnosis not present

## 2019-10-01 DIAGNOSIS — F419 Anxiety disorder, unspecified: Secondary | ICD-10-CM | POA: Diagnosis not present

## 2019-10-01 DIAGNOSIS — G20 Parkinson's disease: Secondary | ICD-10-CM | POA: Diagnosis not present

## 2019-10-01 DIAGNOSIS — R296 Repeated falls: Secondary | ICD-10-CM | POA: Insufficient documentation

## 2019-10-01 DIAGNOSIS — K3189 Other diseases of stomach and duodenum: Secondary | ICD-10-CM | POA: Diagnosis not present

## 2019-10-01 DIAGNOSIS — Z85048 Personal history of other malignant neoplasm of rectum, rectosigmoid junction, and anus: Secondary | ICD-10-CM | POA: Diagnosis not present

## 2019-10-01 DIAGNOSIS — R197 Diarrhea, unspecified: Secondary | ICD-10-CM | POA: Insufficient documentation

## 2019-10-01 DIAGNOSIS — G4730 Sleep apnea, unspecified: Secondary | ICD-10-CM | POA: Diagnosis not present

## 2019-10-01 DIAGNOSIS — F319 Bipolar disorder, unspecified: Secondary | ICD-10-CM | POA: Insufficient documentation

## 2019-10-01 DIAGNOSIS — Z79899 Other long term (current) drug therapy: Secondary | ICD-10-CM | POA: Insufficient documentation

## 2019-10-01 DIAGNOSIS — I10 Essential (primary) hypertension: Secondary | ICD-10-CM | POA: Insufficient documentation

## 2019-10-01 DIAGNOSIS — E109 Type 1 diabetes mellitus without complications: Secondary | ICD-10-CM | POA: Insufficient documentation

## 2019-10-01 DIAGNOSIS — K5900 Constipation, unspecified: Secondary | ICD-10-CM | POA: Diagnosis not present

## 2019-10-01 DIAGNOSIS — K259 Gastric ulcer, unspecified as acute or chronic, without hemorrhage or perforation: Secondary | ICD-10-CM | POA: Diagnosis not present

## 2019-10-01 DIAGNOSIS — K2100 Gastro-esophageal reflux disease with esophagitis, without bleeding: Secondary | ICD-10-CM | POA: Insufficient documentation

## 2019-10-01 DIAGNOSIS — Z794 Long term (current) use of insulin: Secondary | ICD-10-CM | POA: Insufficient documentation

## 2019-10-01 DIAGNOSIS — R1310 Dysphagia, unspecified: Secondary | ICD-10-CM

## 2019-10-01 HISTORY — PX: BIOPSY: SHX5522

## 2019-10-01 HISTORY — PX: ESOPHAGOGASTRODUODENOSCOPY (EGD) WITH PROPOFOL: SHX5813

## 2019-10-01 HISTORY — PX: MALONEY DILATION: SHX5535

## 2019-10-01 LAB — GLUCOSE, CAPILLARY
Glucose-Capillary: 113 mg/dL — ABNORMAL HIGH (ref 70–99)
Glucose-Capillary: 75 mg/dL (ref 70–99)
Glucose-Capillary: 76 mg/dL (ref 70–99)
Glucose-Capillary: 92 mg/dL (ref 70–99)

## 2019-10-01 SURGERY — ESOPHAGOGASTRODUODENOSCOPY (EGD) WITH PROPOFOL
Anesthesia: General

## 2019-10-01 MED ORDER — MIDAZOLAM HCL 2 MG/2ML IJ SOLN: 0.5000 mg | Freq: Once | INTRAMUSCULAR | Status: DC | PRN

## 2019-10-01 MED ORDER — PROPOFOL 500 MG/50ML IV EMUL
INTRAVENOUS | Status: DC | PRN
  Administered 2019-10-01: 125 ug/kg/min via INTRAVENOUS

## 2019-10-01 MED ORDER — LABETALOL HCL 5 MG/ML IV SOLN
INTRAVENOUS | Status: AC
  Filled 2019-10-01: qty 4

## 2019-10-01 MED ORDER — KETAMINE HCL 50 MG/5ML IJ SOSY
PREFILLED_SYRINGE | INTRAMUSCULAR | Status: AC
  Filled 2019-10-01: qty 5

## 2019-10-01 MED ORDER — LIDOCAINE HCL (CARDIAC) PF 100 MG/5ML IV SOSY
PREFILLED_SYRINGE | INTRAVENOUS | Status: DC | PRN
  Administered 2019-10-01: 50 mg via INTRATRACHEAL

## 2019-10-01 MED ORDER — METOPROLOL TARTRATE 5 MG/5ML IV SOLN
INTRAVENOUS | Status: AC
  Filled 2019-10-01: qty 5

## 2019-10-01 MED ORDER — KETAMINE HCL 10 MG/ML IJ SOLN
INTRAMUSCULAR | Status: DC | PRN
  Administered 2019-10-01: 30 mg via INTRAVENOUS

## 2019-10-01 MED ORDER — HYDROMORPHONE HCL 1 MG/ML IJ SOLN: 0.2500 mg | INTRAMUSCULAR | Status: DC | PRN

## 2019-10-01 MED ORDER — LACTATED RINGERS IV SOLN: INTRAVENOUS | Status: DC

## 2019-10-01 MED ORDER — CHLORHEXIDINE GLUCONATE CLOTH 2 % EX PADS: 6.0000 | MEDICATED_PAD | Freq: Once | CUTANEOUS | Status: DC

## 2019-10-01 MED ORDER — GLYCOPYRROLATE 0.2 MG/ML IJ SOLN
INTRAMUSCULAR | Status: DC | PRN
  Administered 2019-10-01: .1 mg via INTRAVENOUS

## 2019-10-01 MED ORDER — PROMETHAZINE HCL 25 MG/ML IJ SOLN: 6.2500 mg | INTRAMUSCULAR | Status: DC | PRN

## 2019-10-01 MED ORDER — PROPOFOL 10 MG/ML IV BOLUS
INTRAVENOUS | Status: DC | PRN
  Administered 2019-10-01: 20 mg via INTRAVENOUS

## 2019-10-01 MED ORDER — LACTATED RINGERS IV SOLN: INTRAVENOUS | Status: DC | PRN

## 2019-10-01 NOTE — Anesthesia Postprocedure Evaluation (Signed)
Anesthesia Post Note  Patient: Linda English  Procedure(s) Performed: ESOPHAGOGASTRODUODENOSCOPY (EGD) WITH PROPOFOL (N/A ) MALONEY DILATION (N/A ) BIOPSY  Patient location during evaluation: PACU Anesthesia Type: General Level of consciousness: awake Pain management: pain level controlled Vital Signs Assessment: post-procedure vital signs reviewed and stable Respiratory status: spontaneous breathing Cardiovascular status: stable Postop Assessment: no apparent nausea or vomiting Anesthetic complications: no     Last Vitals:  Vitals:   10/01/19 1132  BP: (!) 143/84  Pulse: 76  Resp: 20  Temp: 36.8 C  SpO2: 100%    Last Pain:  Vitals:   10/01/19 1207  TempSrc:   PainSc: 0-No pain                 Jmichael Gille Hristova

## 2019-10-01 NOTE — Anesthesia Preprocedure Evaluation (Signed)
Anesthesia Evaluation  Patient identified by MRN, date of birth, ID band Patient awake    Reviewed: Allergy & Precautions, NPO status , Patient's Chart, lab work & pertinent test results, reviewed documented beta blocker date and time   Airway Mallampati: II  TM Distance: >3 FB Neck ROM: Full    Dental no notable dental hx. (+) Teeth Intact   Pulmonary sleep apnea and Continuous Positive Airway Pressure Ventilation ,    Pulmonary exam normal breath sounds clear to auscultation       Cardiovascular Exercise Tolerance: Poor hypertension, Pt. on medications negative cardio ROS Normal cardiovascular examII Rhythm:Regular Rate:Normal  Denies known cardiac issues    Neuro/Psych  Headaches, Anxiety Depression Bipolar Disorder parkinsons -tremor noted negative psych ROS   GI/Hepatic Neg liver ROS, hiatal hernia, PUD, GERD  Medicated and Controlled,Here for EGD for n/v Denies GERD Sx or full feeling today    Endo/Other  negative endocrine ROSdiabetes, Well Controlled, Type 1, Oral Hypoglycemic Agents, Insulin Dependent  Renal/GU negative Renal ROS  negative genitourinary   Musculoskeletal  (+) Arthritis , Osteoarthritis,    Abdominal   Peds negative pediatric ROS (+)  Hematology negative hematology ROS (+)   Anesthesia Other Findings   Reproductive/Obstetrics negative OB ROS                             Anesthesia Physical Anesthesia Plan  ASA: III  Anesthesia Plan: General   Post-op Pain Management:    Induction: Intravenous  PONV Risk Score and Plan: 3 and TIVA, Propofol infusion, Ondansetron and Treatment may vary due to age or medical condition  Airway Management Planned: Simple Face Mask and Nasal Cannula  Additional Equipment:   Intra-op Plan:   Post-operative Plan:   Informed Consent: I have reviewed the patients History and Physical, chart, labs and discussed the procedure  including the risks, benefits and alternatives for the proposed anesthesia with the patient or authorized representative who has indicated his/her understanding and acceptance.     Dental advisory given  Plan Discussed with: CRNA  Anesthesia Plan Comments: (Plan Full PPE use  Plan GA with GETA as needed d/w pt -WTP with same after Q&A)        Anesthesia Quick Evaluation

## 2019-10-01 NOTE — Op Note (Signed)
[facility] Patient Name: Linda English Procedure Date: 10/01/2019 11:14 AM MRN: [id number] Date of Birth: 1956/06/19 Attending MD: Norvel Richards , MD CSN: [id number] Age: 64 Admit Type: Outpatient Procedure:                Upper GI endoscopy Indications:              Dysphagia Providers:                Norvel Richards, MD, Janeece Riggers, RN, Aram Candela Referring MD:              Medicines:                Propofol per Anesthesia Complications:            No immediate complications. Estimated Blood Loss:     Estimated blood loss was minimal. Procedure:                Pre-Anesthesia Assessment:                           - Prior to the procedure, a History and Physical                            was performed, and patient medications and                            allergies were reviewed. The patient's tolerance of                            previous anesthesia was also reviewed. The risks                            and benefits of the procedure and the sedation                            options and risks were discussed with the patient.                            All questions were answered, and informed consent                            was obtained. Prior Anticoagulants: The patient has                            taken no previous anticoagulant or antiplatelet                            agents. ASA Grade Assessment: II - A patient with                            mild systemic disease. After reviewing the risks  and benefits, the patient was deemed in                            satisfactory condition to undergo the procedure.                           After obtaining informed consent, the endoscope was                            passed under direct vision. Throughout the                            procedure, the patient's blood pressure, pulse, and                            oxygen saturations were  monitored continuously. The                            [id number]) was introduced through the                            mouth, and advanced to the second part of duodenum.                            The upper GI endoscopy was accomplished without                            difficulty. The patient tolerated the procedure                            well. Scope In: 12:09:17 PM Scope Out: 12:15:10 PM Total Procedure Duration: 0 hours 5 minutes 53 seconds  Findings:      The examined esophagus was normal. The scope was withdrawn. Dilation was       performed with a Maloney dilator with mild resistance at 76 Fr. The       dilation site was examined following endoscope reinsertion and showed no       change. Estimated blood loss: none.      Striped moderately erythematous mucosa was found in the entire examined       stomach.      Multiple 5 mm pedunculated and sessile polyps with stigmata of recent       bleeding were found in the gastric body.      The duodenal bulb and second portion of the duodenum were normal. The       abnormal gastric mucosa and gastric polyps were biopsied separately.       Estimated blood loss was minimal. Impression:               - Normal esophagus.                           - Erythematous mucosa in the stomach. Biopsied                           - Multiple gastric polyps. Biopsied                           -  Normal duodenal bulb and second portion of the                            duodenum. Biopsied. Moderate Sedation:      Moderate (conscious) sedation was personally administered by an       anesthesia professional. The following parameters were monitored: oxygen       saturation, heart rate, blood pressure, respiratory rate, EKG, adequacy       of pulmonary ventilation, and response to care. Recommendation:           - Written discharge instructions were provided to                            the patient.                           - The signs and  symptoms of potential delayed                            complications were discussed with the patient.                           - Patient has a contact number available for                            emergencies.                           - Return to normal activities tomorrow.                           - Advance diet as tolerated.                           - Continue present medications. Follow-up pathology.                           - Return to GI clinic in 8 weeks. Procedure Code(s):        --- Professional ---                           [phone], Esophagogastroduodenoscopy, flexible,                            transoral; with biopsy, single or multiple Diagnosis Code(s):        --- Professional ---                           K31.89, Other diseases of stomach and duodenum                           K31.7, Polyp of stomach and duodenum                           R13.10, Dysphagia, unspecified CPT copyright 2019 American Medical Association. All rights reserved. The codes documented in this report are preliminary and upon coder  review may  be revised to meet current compliance requirements. Cristopher Estimable. Nidhi Jacome, MD Norvel Richards, MD 10/01/2019 12:36:04 PM This report has been signed electronically. Number of Addenda: 0

## 2019-10-01 NOTE — Progress Notes (Signed)
At 1224 pt presented to PACU CBG 75. This nurse gave pt regular coke drank 1/2 cup recheck blood sugar results 76 then pt finished the other half of coke rechecked blood sugar results 92. Given instructions to eat a good meal when she gets home. Pt verbalized understanding.

## 2019-10-01 NOTE — Transfer of Care (Signed)
Immediate Anesthesia Transfer of Care Note  Patient: Linda English  Procedure(s) Performed: ESOPHAGOGASTRODUODENOSCOPY (EGD) WITH PROPOFOL (N/A ) MALONEY DILATION (N/A ) BIOPSY  Patient Location: PACU  Anesthesia Type:General  Level of Consciousness: awake  Airway & Oxygen Therapy: Patient Spontanous Breathing  Post-op Assessment: Report given to RN and Post -op Vital signs reviewed and stable  Post vital signs: Reviewed and stable  Last Vitals:  Vitals Value Taken Time  BP 128/67 10/01/19 1224  Temp    Pulse    Resp 13 10/01/19 1227  SpO2    Vitals shown include unvalidated device data.  Last Pain:  Vitals:   10/01/19 1207  TempSrc:   PainSc: 0-No pain      Patients Stated Pain Goal: 10 ([phone] [id number])  Complications: No apparent anesthesia complications

## 2019-10-01 NOTE — Discharge Instructions (Signed)
EGD Discharge instructions Please read the instructions outlined below and refer to this sheet in the next few weeks. These discharge instructions provide you with general information on caring for yourself after you leave the hospital. Your doctor may also give you specific instructions. While your treatment has been planned according to the most current medical practices available, unavoidable complications occasionally occur. If you have any problems or questions after discharge, please call your doctor. ACTIVITY  You may resume your regular activity but move at a slower pace for the next 24 hours.   Take frequent rest periods for the next 24 hours.   Walking will help expel (get rid of) the air and reduce the bloated feeling in your abdomen.   No driving for 24 hours (because of the anesthesia (medicine) used during the test).   You may shower.   Do not sign any important legal documents or operate any machinery for 24 hours (because of the anesthesia used during the test).  NUTRITION  Drink plenty of fluids.   You may resume your normal diet.   Begin with a light meal and progress to your normal diet.   Avoid alcoholic beverages for 24 hours or as instructed by your caregiver.  MEDICATIONS  You may resume your normal medications unless your caregiver tells you otherwise.  WHAT YOU CAN EXPECT TODAY  You may experience abdominal discomfort such as a feeling of fullness or "gas" pains.  FOLLOW-UP  Your doctor will discuss the results of your test with you.  SEEK IMMEDIATE MEDICAL ATTENTION IF ANY OF THE FOLLOWING OCCUR:  Excessive nausea (feeling sick to your stomach) and/or vomiting.   Severe abdominal pain and distention (    swelling).   Trouble swallowing.   Temperature over 101 F (37.8 C).   Rectal bleeding or vomiting of blood.   Your esophagus was stretched  Today.  Your stomach was inflamed.  Biopsies were taken.  Further recommendations to follow  pending review of pathology report  Office visit with [place] in 2 months.  At patient request, I called Marcella Dubs at [phone] -got voicemail

## 2019-10-01 NOTE — Interval H&P Note (Signed)
History and Physical Interval Note:  10/01/2019 11:56 AM  Linda English  has presented today for surgery, with the diagnosis of dysphagia, refractory nausea/vomiting.  The various methods of treatment have been discussed with the patient and family. After consideration of risks, benefits and other options for treatment, the patient has consented to  Procedure(s) with comments: ESOPHAGOGASTRODUODENOSCOPY (EGD) WITH PROPOFOL (N/A) - 1:15pm MALONEY DILATION (N/A) as a surgical intervention.  The patient's history has been reviewed, patient examined, no change in status, stable for surgery.  I have reviewed the patient's chart and labs.  Questions were answered to the patient's satisfaction.     Linda English  No change.  EGD with esophageal dilation as feasible/appropriate per plan.  The risks, benefits, limitations, alternatives and imponderables have been reviewed with the patient. Potential for esophageal dilation, biopsy, etc. have also been reviewed.  Questions have been answered. All parties agreeable.

## 2019-10-02 ENCOUNTER — Encounter: Payer: Self-pay | Admitting: Internal Medicine

## 2019-10-02 ENCOUNTER — Other Ambulatory Visit: Payer: Self-pay

## 2019-10-02 DIAGNOSIS — K219 Gastro-esophageal reflux disease without esophagitis: Secondary | ICD-10-CM | POA: Diagnosis not present

## 2019-10-02 DIAGNOSIS — G20 Parkinson's disease: Secondary | ICD-10-CM | POA: Diagnosis not present

## 2019-10-02 DIAGNOSIS — I10 Essential (primary) hypertension: Secondary | ICD-10-CM | POA: Diagnosis not present

## 2019-10-02 DIAGNOSIS — G4730 Sleep apnea, unspecified: Secondary | ICD-10-CM | POA: Diagnosis not present

## 2019-10-02 DIAGNOSIS — M158 Other polyosteoarthritis: Secondary | ICD-10-CM | POA: Diagnosis not present

## 2019-10-02 DIAGNOSIS — Z7984 Long term (current) use of oral hypoglycemic drugs: Secondary | ICD-10-CM | POA: Diagnosis not present

## 2019-10-02 DIAGNOSIS — S065X0D Traumatic subdural hemorrhage without loss of consciousness, subsequent encounter: Secondary | ICD-10-CM | POA: Diagnosis not present

## 2019-10-02 DIAGNOSIS — E119 Type 2 diabetes mellitus without complications: Secondary | ICD-10-CM | POA: Diagnosis not present

## 2019-10-02 DIAGNOSIS — Z85048 Personal history of other malignant neoplasm of rectum, rectosigmoid junction, and anus: Secondary | ICD-10-CM | POA: Diagnosis not present

## 2019-10-02 DIAGNOSIS — Z9181 History of falling: Secondary | ICD-10-CM | POA: Diagnosis not present

## 2019-10-02 DIAGNOSIS — K589 Irritable bowel syndrome without diarrhea: Secondary | ICD-10-CM | POA: Diagnosis not present

## 2019-10-02 LAB — SURGICAL PATHOLOGY

## 2019-10-02 NOTE — Telephone Encounter (Signed)
Pt's home health nurse [place] called and states pt was seen by her neurologist last week and was asked to d/c Zofran, Trazadone, Kepra, Amantadine. Pt had her EDG as directed yesterday and has started vomiting. Pt would like to know if she can go back on the Zofran to help with vomiting. Pt has been taking an antibiotic Emycin 4 times day and was told to continue med to help with her stomach. Pt has been taking medication for several months. Please advise if pt can restart Zofran.

## 2019-10-02 NOTE — Telephone Encounter (Signed)
Pt's home health nurse [place] called and states Pt saw her neurologist last week and was asked to D/c Zofran, Trazdone,

## 2019-10-02 NOTE — Telephone Encounter (Signed)
Noted. Spoke with pts home health nurse. Pt and nurse were notified that pt can restart the Zofran if it was helping and need to call the Neurologist to inform them.

## 2019-10-02 NOTE — Telephone Encounter (Signed)
I am not sure the rationale for stopping Zofran without seeing office note. If Zofran was helpful, please resume but have her call Neurology to inform as well.

## 2019-10-08 ENCOUNTER — Telehealth: Payer: Self-pay | Admitting: Internal Medicine

## 2019-10-08 NOTE — Telephone Encounter (Signed)
PATIENT DAUGHTER CALLED ASKING IF PATIENT RESULTS WERE COMPLETE FROM HER PROCEDURE

## 2019-10-08 NOTE — Telephone Encounter (Signed)
Spoke with pts daughter. Pt's daughter didn't understand why her mother was taken off of Zofran. Pt's daughter was asked to call pts doctor that d/c Zofran. Medication was given by AB and it isn't clear why pt might have been asked to stop. Pt's daughter states her mother has an appointment with that doctor tomorrow and she will address the issues with them.

## 2019-10-09 DIAGNOSIS — R296 Repeated falls: Secondary | ICD-10-CM | POA: Diagnosis not present

## 2019-10-09 DIAGNOSIS — Z79899 Other long term (current) drug therapy: Secondary | ICD-10-CM | POA: Diagnosis not present

## 2019-10-09 DIAGNOSIS — G2111 Neuroleptic induced parkinsonism: Secondary | ICD-10-CM | POA: Diagnosis not present

## 2019-10-10 DIAGNOSIS — K219 Gastro-esophageal reflux disease without esophagitis: Secondary | ICD-10-CM | POA: Diagnosis not present

## 2019-10-10 DIAGNOSIS — G20 Parkinson's disease: Secondary | ICD-10-CM | POA: Diagnosis not present

## 2019-10-10 DIAGNOSIS — K589 Irritable bowel syndrome without diarrhea: Secondary | ICD-10-CM | POA: Diagnosis not present

## 2019-10-10 DIAGNOSIS — Z7984 Long term (current) use of oral hypoglycemic drugs: Secondary | ICD-10-CM | POA: Diagnosis not present

## 2019-10-10 DIAGNOSIS — G4730 Sleep apnea, unspecified: Secondary | ICD-10-CM | POA: Diagnosis not present

## 2019-10-10 DIAGNOSIS — Z85048 Personal history of other malignant neoplasm of rectum, rectosigmoid junction, and anus: Secondary | ICD-10-CM | POA: Diagnosis not present

## 2019-10-10 DIAGNOSIS — S065X0D Traumatic subdural hemorrhage without loss of consciousness, subsequent encounter: Secondary | ICD-10-CM | POA: Diagnosis not present

## 2019-10-10 DIAGNOSIS — M158 Other polyosteoarthritis: Secondary | ICD-10-CM | POA: Diagnosis not present

## 2019-10-10 DIAGNOSIS — E119 Type 2 diabetes mellitus without complications: Secondary | ICD-10-CM | POA: Diagnosis not present

## 2019-10-10 DIAGNOSIS — Z9181 History of falling: Secondary | ICD-10-CM | POA: Diagnosis not present

## 2019-10-10 DIAGNOSIS — I10 Essential (primary) hypertension: Secondary | ICD-10-CM | POA: Diagnosis not present

## 2019-10-11 DIAGNOSIS — G20 Parkinson's disease: Secondary | ICD-10-CM | POA: Diagnosis not present

## 2019-10-11 DIAGNOSIS — N179 Acute kidney failure, unspecified: Secondary | ICD-10-CM | POA: Diagnosis not present

## 2019-10-11 DIAGNOSIS — S065X0D Traumatic subdural hemorrhage without loss of consciousness, subsequent encounter: Secondary | ICD-10-CM | POA: Diagnosis not present

## 2019-10-12 ENCOUNTER — Telehealth: Payer: Self-pay | Admitting: Internal Medicine

## 2019-10-12 NOTE — Telephone Encounter (Signed)
Lmom, waiting on a return call.  

## 2019-10-12 NOTE — Telephone Encounter (Signed)
[phone] patient daughter called and said her mothers prescription is not enough pills to cover the amount we wanted her to take.  She will run out before the refill is due

## 2019-10-15 ENCOUNTER — Telehealth: Payer: Self-pay | Admitting: Internal Medicine

## 2019-10-15 DIAGNOSIS — Z9181 History of falling: Secondary | ICD-10-CM | POA: Diagnosis not present

## 2019-10-15 DIAGNOSIS — G4730 Sleep apnea, unspecified: Secondary | ICD-10-CM | POA: Diagnosis not present

## 2019-10-15 DIAGNOSIS — Z7984 Long term (current) use of oral hypoglycemic drugs: Secondary | ICD-10-CM | POA: Diagnosis not present

## 2019-10-15 DIAGNOSIS — I10 Essential (primary) hypertension: Secondary | ICD-10-CM | POA: Diagnosis not present

## 2019-10-15 DIAGNOSIS — S065X0D Traumatic subdural hemorrhage without loss of consciousness, subsequent encounter: Secondary | ICD-10-CM | POA: Diagnosis not present

## 2019-10-15 DIAGNOSIS — E119 Type 2 diabetes mellitus without complications: Secondary | ICD-10-CM | POA: Diagnosis not present

## 2019-10-15 DIAGNOSIS — Z85048 Personal history of other malignant neoplasm of rectum, rectosigmoid junction, and anus: Secondary | ICD-10-CM | POA: Diagnosis not present

## 2019-10-15 DIAGNOSIS — M158 Other polyosteoarthritis: Secondary | ICD-10-CM | POA: Diagnosis not present

## 2019-10-15 DIAGNOSIS — G20 Parkinson's disease: Secondary | ICD-10-CM | POA: Diagnosis not present

## 2019-10-15 DIAGNOSIS — K219 Gastro-esophageal reflux disease without esophagitis: Secondary | ICD-10-CM | POA: Diagnosis not present

## 2019-10-15 DIAGNOSIS — K589 Irritable bowel syndrome without diarrhea: Secondary | ICD-10-CM | POA: Diagnosis not present

## 2019-10-15 NOTE — Telephone Encounter (Signed)
PATIENT DAUGHTER CALLED AND SAID THAT HER PHARMACY NEEDS PRIOR AUTH FOR HER PRESCRIPTION OF ZOFRAN

## 2019-10-15 NOTE — Telephone Encounter (Signed)
LMOM, waiting on a return call.  

## 2019-10-19 DIAGNOSIS — Z7984 Long term (current) use of oral hypoglycemic drugs: Secondary | ICD-10-CM | POA: Diagnosis not present

## 2019-10-19 DIAGNOSIS — M158 Other polyosteoarthritis: Secondary | ICD-10-CM | POA: Diagnosis not present

## 2019-10-19 DIAGNOSIS — K589 Irritable bowel syndrome without diarrhea: Secondary | ICD-10-CM | POA: Diagnosis not present

## 2019-10-19 DIAGNOSIS — G4730 Sleep apnea, unspecified: Secondary | ICD-10-CM | POA: Diagnosis not present

## 2019-10-19 DIAGNOSIS — G20 Parkinson's disease: Secondary | ICD-10-CM | POA: Diagnosis not present

## 2019-10-19 DIAGNOSIS — I10 Essential (primary) hypertension: Secondary | ICD-10-CM | POA: Diagnosis not present

## 2019-10-19 DIAGNOSIS — S065X0D Traumatic subdural hemorrhage without loss of consciousness, subsequent encounter: Secondary | ICD-10-CM | POA: Diagnosis not present

## 2019-10-19 DIAGNOSIS — E119 Type 2 diabetes mellitus without complications: Secondary | ICD-10-CM | POA: Diagnosis not present

## 2019-10-19 DIAGNOSIS — K219 Gastro-esophageal reflux disease without esophagitis: Secondary | ICD-10-CM | POA: Diagnosis not present

## 2019-10-19 DIAGNOSIS — Z85048 Personal history of other malignant neoplasm of rectum, rectosigmoid junction, and anus: Secondary | ICD-10-CM | POA: Diagnosis not present

## 2019-10-19 DIAGNOSIS — Z9181 History of falling: Secondary | ICD-10-CM | POA: Diagnosis not present

## 2019-10-23 ENCOUNTER — Observation Stay (HOSPITAL_COMMUNITY): Payer: Medicare Other

## 2019-10-23 ENCOUNTER — Inpatient Hospital Stay (HOSPITAL_COMMUNITY)
Admission: EM | Admit: 2019-10-23 | Discharge: 2019-10-26 | DRG: 682 | Disposition: A | Discharge status: Home Health | Payer: Medicare Other | Attending: Internal Medicine | Admitting: Internal Medicine

## 2019-10-23 ENCOUNTER — Other Ambulatory Visit: Payer: Self-pay

## 2019-10-23 ENCOUNTER — Emergency Department (HOSPITAL_COMMUNITY): Payer: Medicare Other

## 2019-10-23 ENCOUNTER — Encounter (HOSPITAL_COMMUNITY): Payer: Self-pay | Admitting: *Deleted

## 2019-10-23 DIAGNOSIS — Z8371 Family history of colonic polyps: Secondary | ICD-10-CM

## 2019-10-23 DIAGNOSIS — N1831 Chronic kidney disease, stage 3a: Secondary | ICD-10-CM | POA: Diagnosis not present

## 2019-10-23 DIAGNOSIS — S065XAA Traumatic subdural hemorrhage with loss of consciousness status unknown, initial encounter: Secondary | ICD-10-CM | POA: Diagnosis present

## 2019-10-23 DIAGNOSIS — F319 Bipolar disorder, unspecified: Secondary | ICD-10-CM | POA: Diagnosis present

## 2019-10-23 DIAGNOSIS — K219 Gastro-esophageal reflux disease without esophagitis: Secondary | ICD-10-CM | POA: Diagnosis not present

## 2019-10-23 DIAGNOSIS — Z7984 Long term (current) use of oral hypoglycemic drugs: Secondary | ICD-10-CM

## 2019-10-23 DIAGNOSIS — E1122 Type 2 diabetes mellitus with diabetic chronic kidney disease: Secondary | ICD-10-CM | POA: Insufficient documentation

## 2019-10-23 DIAGNOSIS — R1310 Dysphagia, unspecified: Secondary | ICD-10-CM | POA: Diagnosis present

## 2019-10-23 DIAGNOSIS — R634 Abnormal weight loss: Secondary | ICD-10-CM

## 2019-10-23 DIAGNOSIS — G4733 Obstructive sleep apnea (adult) (pediatric): Secondary | ICD-10-CM | POA: Diagnosis not present

## 2019-10-23 DIAGNOSIS — I471 Supraventricular tachycardia: Secondary | ICD-10-CM | POA: Diagnosis present

## 2019-10-23 DIAGNOSIS — S065X0D Traumatic subdural hemorrhage without loss of consciousness, subsequent encounter: Secondary | ICD-10-CM | POA: Diagnosis not present

## 2019-10-23 DIAGNOSIS — I639 Cerebral infarction, unspecified: Secondary | ICD-10-CM

## 2019-10-23 DIAGNOSIS — Z800 Family history of malignant neoplasm of digestive organs: Secondary | ICD-10-CM | POA: Diagnosis not present

## 2019-10-23 DIAGNOSIS — I129 Hypertensive chronic kidney disease with stage 1 through stage 4 chronic kidney disease, or unspecified chronic kidney disease: Secondary | ICD-10-CM | POA: Diagnosis present

## 2019-10-23 DIAGNOSIS — E1143 Type 2 diabetes mellitus with diabetic autonomic (poly)neuropathy: Secondary | ICD-10-CM | POA: Diagnosis not present

## 2019-10-23 DIAGNOSIS — K317 Polyp of stomach and duodenum: Secondary | ICD-10-CM | POA: Diagnosis present

## 2019-10-23 DIAGNOSIS — E43 Unspecified severe protein-calorie malnutrition: Secondary | ICD-10-CM | POA: Diagnosis not present

## 2019-10-23 DIAGNOSIS — E860 Dehydration: Secondary | ICD-10-CM | POA: Diagnosis not present

## 2019-10-23 DIAGNOSIS — W19XXXA Unspecified fall, initial encounter: Secondary | ICD-10-CM | POA: Diagnosis present

## 2019-10-23 DIAGNOSIS — Z85048 Personal history of other malignant neoplasm of rectum, rectosigmoid junction, and anus: Secondary | ICD-10-CM | POA: Diagnosis not present

## 2019-10-23 DIAGNOSIS — R509 Fever, unspecified: Secondary | ICD-10-CM | POA: Diagnosis not present

## 2019-10-23 DIAGNOSIS — Z8673 Personal history of transient ischemic attack (TIA), and cerebral infarction without residual deficits: Secondary | ICD-10-CM | POA: Diagnosis not present

## 2019-10-23 DIAGNOSIS — I340 Nonrheumatic mitral (valve) insufficiency: Secondary | ICD-10-CM | POA: Diagnosis not present

## 2019-10-23 DIAGNOSIS — N179 Acute kidney failure, unspecified: Secondary | ICD-10-CM | POA: Diagnosis not present

## 2019-10-23 DIAGNOSIS — I351 Nonrheumatic aortic (valve) insufficiency: Secondary | ICD-10-CM | POA: Diagnosis not present

## 2019-10-23 DIAGNOSIS — Z981 Arthrodesis status: Secondary | ICD-10-CM

## 2019-10-23 DIAGNOSIS — Z9181 History of falling: Secondary | ICD-10-CM | POA: Insufficient documentation

## 2019-10-23 DIAGNOSIS — I10 Essential (primary) hypertension: Secondary | ICD-10-CM | POA: Diagnosis present

## 2019-10-23 DIAGNOSIS — Z20822 Contact with and (suspected) exposure to covid-19: Secondary | ICD-10-CM | POA: Diagnosis present

## 2019-10-23 DIAGNOSIS — R531 Weakness: Secondary | ICD-10-CM | POA: Diagnosis not present

## 2019-10-23 DIAGNOSIS — K589 Irritable bowel syndrome without diarrhea: Secondary | ICD-10-CM | POA: Diagnosis not present

## 2019-10-23 DIAGNOSIS — R29707 NIHSS score 7: Secondary | ICD-10-CM | POA: Diagnosis present

## 2019-10-23 DIAGNOSIS — G9341 Metabolic encephalopathy: Secondary | ICD-10-CM | POA: Diagnosis not present

## 2019-10-23 DIAGNOSIS — R29818 Other symptoms and signs involving the nervous system: Secondary | ICD-10-CM | POA: Diagnosis not present

## 2019-10-23 DIAGNOSIS — S065X9A Traumatic subdural hemorrhage with loss of consciousness of unspecified duration, initial encounter: Secondary | ICD-10-CM | POA: Diagnosis present

## 2019-10-23 DIAGNOSIS — Z90710 Acquired absence of both cervix and uterus: Secondary | ICD-10-CM

## 2019-10-23 DIAGNOSIS — E119 Type 2 diabetes mellitus without complications: Secondary | ICD-10-CM

## 2019-10-23 DIAGNOSIS — Z8711 Personal history of peptic ulcer disease: Secondary | ICD-10-CM

## 2019-10-23 DIAGNOSIS — S0990XA Unspecified injury of head, initial encounter: Secondary | ICD-10-CM | POA: Diagnosis not present

## 2019-10-23 DIAGNOSIS — E785 Hyperlipidemia, unspecified: Secondary | ICD-10-CM | POA: Diagnosis not present

## 2019-10-23 DIAGNOSIS — Z681 Body mass index (BMI) 19 or less, adult: Secondary | ICD-10-CM | POA: Diagnosis not present

## 2019-10-23 DIAGNOSIS — R414 Neurologic neglect syndrome: Secondary | ICD-10-CM | POA: Diagnosis present

## 2019-10-23 DIAGNOSIS — G4730 Sleep apnea, unspecified: Secondary | ICD-10-CM | POA: Diagnosis not present

## 2019-10-23 DIAGNOSIS — N183 Chronic kidney disease, stage 3 unspecified: Secondary | ICD-10-CM | POA: Diagnosis present

## 2019-10-23 DIAGNOSIS — I6502 Occlusion and stenosis of left vertebral artery: Secondary | ICD-10-CM | POA: Diagnosis not present

## 2019-10-23 DIAGNOSIS — R296 Repeated falls: Secondary | ICD-10-CM | POA: Diagnosis present

## 2019-10-23 DIAGNOSIS — G20 Parkinson's disease: Secondary | ICD-10-CM | POA: Diagnosis present

## 2019-10-23 DIAGNOSIS — R112 Nausea with vomiting, unspecified: Secondary | ICD-10-CM

## 2019-10-23 DIAGNOSIS — K3184 Gastroparesis: Secondary | ICD-10-CM | POA: Diagnosis present

## 2019-10-23 DIAGNOSIS — Z9049 Acquired absence of other specified parts of digestive tract: Secondary | ICD-10-CM

## 2019-10-23 DIAGNOSIS — Z79899 Other long term (current) drug therapy: Secondary | ICD-10-CM

## 2019-10-23 DIAGNOSIS — Z743 Need for continuous supervision: Secondary | ICD-10-CM | POA: Diagnosis not present

## 2019-10-23 DIAGNOSIS — M158 Other polyosteoarthritis: Secondary | ICD-10-CM | POA: Diagnosis not present

## 2019-10-23 LAB — URINALYSIS, ROUTINE W REFLEX MICROSCOPIC
Bilirubin Urine: NEGATIVE
Glucose, UA: NEGATIVE mg/dL
Ketones, ur: NEGATIVE mg/dL
Nitrite: NEGATIVE
Protein, ur: 30 mg/dL — AB
Specific Gravity, Urine: 1.016 (ref 1.005–1.030)
pH: 5 (ref 5.0–8.0)

## 2019-10-23 LAB — COMPREHENSIVE METABOLIC PANEL
ALT: 12 U/L (ref 0–44)
AST: 20 U/L (ref 15–41)
Albumin: 4.6 g/dL (ref 3.5–5.0)
Alkaline Phosphatase: 90 U/L (ref 38–126)
Anion gap: 16 — ABNORMAL HIGH (ref 5–15)
BUN: 59 mg/dL — ABNORMAL HIGH (ref 8–23)
CO2: 26 mmol/L (ref 22–32)
Calcium: 9.8 mg/dL (ref 8.9–10.3)
Chloride: 98 mmol/L (ref 98–111)
Creatinine, Ser: 3.26 mg/dL — ABNORMAL HIGH (ref 0.44–1.00)
GFR calc Af Amer: 17 mL/min — ABNORMAL LOW (ref >60)
GFR calc non Af Amer: 14 mL/min — ABNORMAL LOW (ref >60)
Glucose, Bld: 131 mg/dL — ABNORMAL HIGH (ref 70–99)
Potassium: 3.3 mmol/L — ABNORMAL LOW (ref 3.5–5.1)
Sodium: 140 mmol/L (ref 135–145)
Total Bilirubin: 0.5 mg/dL (ref 0.3–1.2)
Total Protein: 7.8 g/dL (ref 6.5–8.1)

## 2019-10-23 LAB — DIFFERENTIAL
Abs Immature Granulocytes: 0.04 10*3/uL (ref 0.00–0.07)
Basophils Absolute: 0 10*3/uL (ref 0.0–0.1)
Basophils Relative: 0 %
Eosinophils Absolute: 0 10*3/uL (ref 0.0–0.5)
Eosinophils Relative: 0 %
Immature Granulocytes: 0 %
Lymphocytes Relative: 25 %
Lymphs Abs: 2.3 10*3/uL (ref 0.7–4.0)
Monocytes Absolute: 0.5 10*3/uL (ref 0.1–1.0)
Monocytes Relative: 5 %
Neutro Abs: 6.2 10*3/uL (ref 1.7–7.7)
Neutrophils Relative %: 70 %

## 2019-10-23 LAB — I-STAT CHEM 8, ED
BUN: 49 mg/dL — ABNORMAL HIGH (ref 8–23)
Calcium, Ion: 1.17 mmol/L (ref 1.15–1.40)
Chloride: 101 mmol/L (ref 98–111)
Creatinine, Ser: 3.4 mg/dL — ABNORMAL HIGH (ref 0.44–1.00)
Glucose, Bld: 124 mg/dL — ABNORMAL HIGH (ref 70–99)
HCT: 38 % (ref 36.0–46.0)
Hemoglobin: 12.9 g/dL (ref 12.0–15.0)
Potassium: 3.2 mmol/L — ABNORMAL LOW (ref 3.5–5.1)
Sodium: 141 mmol/L (ref 135–145)
TCO2: 28 mmol/L (ref 22–32)

## 2019-10-23 LAB — CBC
HCT: 38.2 % (ref 36.0–46.0)
Hemoglobin: 12.3 g/dL (ref 12.0–15.0)
MCH: 27.4 pg (ref 26.0–34.0)
MCHC: 32.2 g/dL (ref 30.0–36.0)
MCV: 85.1 fL (ref 80.0–100.0)
Platelets: 314 10*3/uL (ref 150–400)
RBC: 4.49 MIL/uL (ref 3.87–5.11)
RDW: 13.6 % (ref 11.5–15.5)
WBC: 9 10*3/uL (ref 4.0–10.5)
nRBC: 0 % (ref 0.0–0.2)

## 2019-10-23 LAB — ETHANOL: Alcohol, Ethyl (B): <10 mg/dL (ref <10)

## 2019-10-23 LAB — RAPID URINE DRUG SCREEN, HOSP PERFORMED
Amphetamines: NOT DETECTED
Barbiturates: NOT DETECTED
Benzodiazepines: POSITIVE — AB
Cocaine: NOT DETECTED
Opiates: NOT DETECTED
Tetrahydrocannabinol: NOT DETECTED

## 2019-10-23 LAB — GLUCOSE, CAPILLARY: Glucose-Capillary: 90 mg/dL (ref 70–99)

## 2019-10-23 LAB — APTT: aPTT: 23 seconds — ABNORMAL LOW (ref 24–36)

## 2019-10-23 LAB — MAGNESIUM: Magnesium: 1.8 mg/dL (ref 1.7–2.4)

## 2019-10-23 LAB — PROTIME-INR
INR: 1 (ref 0.8–1.2)
Prothrombin Time: 13.3 seconds (ref 11.4–15.2)

## 2019-10-23 LAB — CBG MONITORING, ED: Glucose-Capillary: 116 mg/dL — ABNORMAL HIGH (ref 70–99)

## 2019-10-23 MED ORDER — PANTOPRAZOLE SODIUM 40 MG PO TBEC
40.0000 mg | DELAYED_RELEASE_TABLET | Freq: Two times a day (BID) | ORAL | Status: DC
  Administered 2019-10-24 – 2019-10-26 (×5): 40 mg via ORAL
  Filled 2019-10-23 (×5): qty 1

## 2019-10-23 MED ORDER — INSULIN ASPART 100 UNIT/ML ~~LOC~~ SOLN
0.0000 [IU] | Freq: Three times a day (TID) | SUBCUTANEOUS | Status: DC
  Administered 2019-10-25: 2 [IU] via SUBCUTANEOUS
  Administered 2019-10-26 (×2): 1 [IU] via SUBCUTANEOUS

## 2019-10-23 MED ORDER — ONDANSETRON HCL 4 MG/2ML IJ SOLN: 4.0000 mg | Freq: Four times a day (QID) | INTRAMUSCULAR | Status: DC | PRN

## 2019-10-23 MED ORDER — AMLODIPINE BESYLATE 5 MG PO TABS
5.0000 mg | ORAL_TABLET | Freq: Every day | ORAL | Status: DC
  Administered 2019-10-24 – 2019-10-25 (×2): 5 mg via ORAL
  Filled 2019-10-23 (×2): qty 1

## 2019-10-23 MED ORDER — POLYETHYLENE GLYCOL 3350 17 G PO PACK: 17.0000 g | PACK | Freq: Every day | ORAL | Status: DC | PRN

## 2019-10-23 MED ORDER — ENSURE ENLIVE PO LIQD
237.0000 mL | Freq: Two times a day (BID) | ORAL | Status: DC
  Administered 2019-10-24 – 2019-10-26 (×4): 237 mL via ORAL

## 2019-10-23 MED ORDER — METOPROLOL TARTRATE 50 MG PO TABS
25.0000 mg | ORAL_TABLET | Freq: Two times a day (BID) | ORAL | Status: DC
  Administered 2019-10-24 – 2019-10-26 (×6): 25 mg via ORAL
  Filled 2019-10-23 (×6): qty 1

## 2019-10-23 MED ORDER — POTASSIUM CHLORIDE CRYS ER 20 MEQ PO TBCR
20.0000 meq | EXTENDED_RELEASE_TABLET | Freq: Once | ORAL | Status: AC
  Administered 2019-10-24: 20 meq via ORAL
  Filled 2019-10-23: qty 1

## 2019-10-23 MED ORDER — ONDANSETRON HCL 4 MG/2ML IJ SOLN: 4.0000 mg | Freq: Once | INTRAMUSCULAR | Status: AC

## 2019-10-23 MED ORDER — INSULIN ASPART 100 UNIT/ML ~~LOC~~ SOLN
0.0000 [IU] | Freq: Every day | SUBCUTANEOUS | Status: DC
  Administered 2019-10-25: 2 [IU] via SUBCUTANEOUS

## 2019-10-23 MED ORDER — LAMOTRIGINE 100 MG PO TABS
200.0000 mg | ORAL_TABLET | Freq: Two times a day (BID) | ORAL | Status: DC
  Administered 2019-10-24 – 2019-10-26 (×6): 200 mg via ORAL
  Filled 2019-10-23 (×6): qty 2

## 2019-10-23 MED ORDER — ONDANSETRON HCL 4 MG PO TABS: 4.0000 mg | ORAL_TABLET | Freq: Four times a day (QID) | ORAL | Status: DC | PRN

## 2019-10-23 MED ORDER — ONDANSETRON HCL 4 MG/2ML IJ SOLN
INTRAMUSCULAR | Status: AC
  Administered 2019-10-23: 4 mg via INTRAVENOUS
  Filled 2019-10-23: qty 2

## 2019-10-23 MED ORDER — HEPARIN SODIUM (PORCINE) 5000 UNIT/ML IJ SOLN
5000.0000 [IU] | Freq: Three times a day (TID) | INTRAMUSCULAR | Status: DC
  Administered 2019-10-24 – 2019-10-26 (×8): 5000 [IU] via SUBCUTANEOUS
  Filled 2019-10-23 (×8): qty 1

## 2019-10-23 MED ORDER — POTASSIUM CHLORIDE IN NACL 20-0.9 MEQ/L-% IV SOLN: INTRAVENOUS | Status: DC

## 2019-10-23 NOTE — ED Triage Notes (Signed)
Pt brought in by [facility] from home. Per family they had awakened pt from a nap at 1330 for her scheduled physical therapy visit and pt was talking as well and seemed weaker. Physical therapist agreed and family called EMS. Last known well was at 13;30 when pt layed down for a nap. Family reported to EMS that pt had a fall yesterday and struck the back of her head but seemed okay at that time.

## 2019-10-23 NOTE — Progress Notes (Signed)
CODE STROKE CT TIMES 1436 Call time 1444 beeper time 1441 exam started 1441 exam finished 1441 images sent to soc 1444 exam completed in [place] [facility] called

## 2019-10-23 NOTE — ED Provider Notes (Addendum)
[facility] EMERGENCY DEPARTMENT Provider Note   CSN: [id number] Arrival date & time: 10/23/19  1401  An emergency department physician performed an initial assessment on this suspected stroke patient at 1435.  History Chief Complaint  Patient presents with  . Weakness    Linda English is a 64 y.o. female with a history as outlined below, most significant for diabetes, hypertension, GERD, Parkinson's disease and a subdural hematoma (11/20)  from a head injury presenting with altered mental status.  She lives in her daughter's home and she had a reported repeat fall yesterday which resulted in a frontal head injury, but felt well after the event with no complaint of headache, confusion or weakness.  Today she laid down to take a nap around 10 AM and when she woke at 13:30 daughter noted that she was less alert than normal and had some confusion.  Pt has had poor PO intake in recent weeks secondary to frequent regurgitation, dg stating she has been living on ice cream and Ensure.  She had an EGD on 2/1 (Dr. Gala Romney) which was negative for the source of her regurgitation symptoms.    Level 5 caveat secondary to altered mental status.  Much of pt's hx given by daughter [facility] after pt given permission.  Dg states at baseline her mother is alert, oriented, conversive and walks with the assistance of a walker.   HPI     Past Medical History:  Diagnosis Date  . Acid reflux   . Allergic rhinitis   . Anxiety   . Arthritis    osteoarthritis  . Bipolar disorder ([place])   . Chronic headache   . Depression   . Diverticula of colon   . DM (diabetes mellitus) ([place])   . Gastric erosions   . Gastric polyps    benign   . Hemorrhoids   . Hiatal hernia   . Hypertension   . IBS (irritable bowel syndrome)   . Lichen planus   . Obstructive sleep apnea   . Panic disorder   . Parkinson's disease ([place])   . Rectal cancer ([place]) 2003   ileostomy and reversal  . Subdural hematoma ([place])   .  Tubular adenoma     Patient Active Problem List   Diagnosis Date Noted  . Vertigo as late effect of stroke 08/13/2019  . Dizziness 08/13/2019  . DKA (diabetic ketoacidoses) ([place]) 08/12/2019  . Rt Sided Subdural hematoma, post-traumatic  07/02/2019  . AKI (acute kidney injury) ([place]) 06/26/2019  . ARF (acute renal failure) ([place]) 06/24/2019  . Nausea & vomiting 06/05/2019  . Diarrhea 07/27/2017  . Transient ischemic attack 08/09/2015  . Reflux esophagitis   . Gastric polyp   . Dyspepsia 06/13/2015  . Dysphagia 06/13/2015  . Incisional irritation 06/18/2013  . Wound drainage 06/18/2013  . Bipolar 1 disorder ([place]) 06/08/2012    Class: Chronic  . Anorexia 05/23/2012  . Insomnia 05/23/2012  . Weight loss 05/23/2012  . Weight loss, abnormal 04/10/2012  . Esophageal dysphagia 04/10/2012  . Nausea 09/08/2011  . GERD (gastroesophageal reflux disease) 07/14/2011  . Epigastric pain 07/14/2011  . Constipation 07/14/2011  . Knee pain 02/09/2011  . OA (osteoarthritis) of knee 02/09/2011  . Family hx of colon cancer 11/30/2010  . Bowel habit changes 11/30/2010  . OSTEOARTHRITIS, KNEE 08/05/2010  . PES PLANUS 08/05/2010  . RECTAL CANCER 10/23/2007  . Diabetes ([place]) 10/23/2007  . HYPERLIPIDEMIA 10/23/2007  . Anxiety state 10/23/2007  . PANIC DISORDER 10/23/2007  . DEPRESSION 10/23/2007  .  OBSTRUCTIVE SLEEP APNEA 10/23/2007  . Essential hypertension 10/23/2007  . ALLERGIC RHINITIS 10/23/2007  . IBS 10/23/2007  . HEADACHE, CHRONIC 10/23/2007    Past Surgical History:  Procedure Laterality Date  . ABDOMINAL HYSTERECTOMY    . APPENDECTOMY    . BACK SURGERY    . BIOPSY  10/01/2019   Procedure: BIOPSY;  Surgeon: Daneil Dolin, MD;  Location: [facility];  Service: Endoscopy;;  gastric  . CESAREAN SECTION     X  2  . CHOLECYSTECTOMY    . COLONOSCOPY  08/2007   DR Gala Romney, friable anal canal hemorrhoids, surgical anastomosis at 3cm, distal scattered tics  . COLONOSCOPY  12/15/2010    anal papilla and internal hemorrhoids/diminutive polyp in the base of the cecum. Past, tubular adenoma.. Next colonoscopy due in April 2017.  Marland Kitchen COLONOSCOPY  10/27/2005   RMR: Anal canal hemorrhoids. Surgical anastomosis at 3 cm from the anal verge appeared normal. Few scattered distal diverticula. The residual  colonic mucosa appeared normal. I suspect the patient bled from hemorrhoids  . COLONOSCOPY N/A 01/15/2015   [id number] residual rectum and colon. next tcs 12/2019  . ESOPHAGEAL DILATION N/A 06/27/2015   Procedure: ESOPHAGEAL DILATION;  Surgeon: Daneil Dolin, MD;  Location: [facility];  Service: Endoscopy;  Laterality: N/A;  . ESOPHAGOGASTRODUODENOSCOPY  05/2002   DR ROURK, normal  . ESOPHAGOGASTRODUODENOSCOPY  08/03/2011   RMR: small HH/ gastric polyps  . ESOPHAGOGASTRODUODENOSCOPY N/A 06/27/2015   Dr. Gala Romney: Mild erosive reflux esophagitits. Status post passage of a Maloney dilator. Hiatal hernia. Gastric polyps and abnormal gastric mucosa of doubtful clinical significane. status post biopsy, benign fundic gland polyp, negative H.pylori  . ESOPHAGOGASTRODUODENOSCOPY (EGD) WITH PROPOFOL N/A 10/01/2019   Procedure: ESOPHAGOGASTRODUODENOSCOPY (EGD) WITH PROPOFOL;  Surgeon: Daneil Dolin, MD;  Location[facility];  Service: Endoscopy;  Laterality: N/A;  1:15pm  . low anterior rection  2003   RECTAL CANCER  . MALONEY DILATION N/A 10/01/2019   Procedure: Venia Minks DILATION;  Surgeon: Daneil Dolin, MD;  Location: [facility];  Service: Endoscopy;  Laterality: N/A;  . SPINE SURGERY  2001   L5,S1 HEMILAMINOTOMY AND DISCECTOMY/DR DEATON     OB History    Gravida      Para      Term      Preterm      AB      Living  2     SAB      TAB      Ectopic      Multiple      Live Births              Family History  Problem Relation Age of Onset  . Colon cancer Paternal Grandfather 70  . Colon polyps Brother   . Colon polyps Cousin     Social History   Tobacco  Use  . Smoking status: Never Smoker  . Smokeless tobacco: Never Used  . Tobacco comment: Never smoked  Substance Use Topics  . Alcohol use: No  . Drug use: No    Home Medications Prior to Admission medications   Medication Sig Start Date End Date Taking? Authorizing Provider  ALPRAZolam Duanne Moron) 0.5 MG tablet Take 1 tablet (0.5 mg total) by mouth 3 (three) times daily as needed for anxiety or sleep. 07/03/19   Roxan Hockey, MD  Amantadine HCl 100 MG tablet Take 1 tablet by mouth 2 (two) times daily. 05/03/19   [provider]  amLODipine ([place])  5 MG tablet Take 1 tablet (5 mg total) by mouth daily. 07/04/19   Roxan Hockey, MD  atorvastatin (LIPITOR) 10 MG tablet Take 10 mg by mouth 3 (three) times a week.  06/15/18   [provider]  Cholecalciferol (VITAMIN D3) 1.25 MG (50000 UT) CAPS Take 1 tablet by mouth once a week.  11/07/18   [provider]  erythromycin (E-MYCIN) 250 MG tablet Take 1 tablet by mouth 4 (four) times daily -  before meals and at bedtime. 08/29/19   [provider]  insulin aspart (NOVOLOG) 100 UNIT/ML injection insulin aspart (novoLOG) injection 0-9 Units 0-9 Units, Subcutaneous, 3 times daily with meals, CBG < 70: Implement Hypoglycemia Standing Orders and refer to Hypoglycemia Standing Orders sidebar report  CBG 70 - 120: 0 units CBG 121 - 150: 1 unit CBG 151 - 200: 2 units CBG 201 - 250: 3 units CBG 251 - 300: 5 units CBG 301 - 350: 7 units CBG 351 - 400: 9 units CBG > 400: 07/03/19 07/02/20  Roxan Hockey, MD  lamoTRIgine (LAMICTAL) 200 MG tablet Take 200 mg by mouth 2 (two) times daily.  06/11/13   [provider]  levETIRAcetam (KEPPRA) 500 MG tablet Take 500 mg by mouth at bedtime.  09/15/15   [provider]  linaclotide Rolan Lipa) 72 MCG capsule Take 1 capsule (72 mcg total) by mouth daily before breakfast. 09/04/19   Annitta Needs, NP  metFORMIN (GLUCOPHAGE-XR) 500 MG 24 hr tablet Take 500 mg by mouth 2  (two) times daily. 06/13/12   Darrol Jump, MD  metoprolol tartrate (LOPRESSOR) 25 MG tablet Take 1 tablet (25 mg total) by mouth 2 (two) times daily. 07/03/19   Roxan Hockey, MD  nystatin (MYCOSTATIN/NYSTOP) powder Apply topically 2 (two) times daily. 07/03/19   Roxan Hockey, MD  ondansetron (ZOFRAN ODT) 4 MG disintegrating tablet Take 1 tablet (4 mg total) by mouth every 8 (eight) hours as needed for nausea or vomiting. 06/05/19   Annitta Needs, NP  ondansetron (ZOFRAN) 4 MG tablet Take 1 tablet (4 mg total) by mouth 3 (three) times daily before meals. 09/25/19   Carlis Stable, NP  ondansetron (ZOFRAN) 4 MG tablet Take 1 tablet (4 mg total) by mouth every 8 (eight) hours as needed for nausea or vomiting. 09/25/19   Carlis Stable, NP  pantoprazole (PROTONIX) 40 MG tablet Take 40 mg by mouth 2 (two) times daily before a meal.    [provider]  traZODone (DESYREL) 100 MG tablet Take 1 tablet (100 mg total) by mouth at bedtime. 07/03/19   Roxan Hockey, MD    Allergies    Shellfish allergy, Lithium, and Excedrin extra strength [aspirin-acetaminophen-caffeine]  Review of Systems   Review of Systems  Unable to perform ROS: Mental status change    Physical Exam Updated Vital Signs BP (!) 129/98   Pulse 95   Temp 98.1 F (36.7 C) (Oral)   Resp 18   Ht 5\' 1"  (1.549 m)   Wt 45.8 kg   SpO2 100%   BMI 19.08 kg/m   Physical Exam Vitals and nursing note reviewed.  Constitutional:      General: She is not in acute distress.    Appearance: She is well-developed. She is ill-appearing.     Comments: Frequent emesis of small amounts of sputum, traces of bilious fluid.  HENT:     Head: Normocephalic and atraumatic.     Mouth/Throat:     Mouth: Mucous  membranes are moist.  Eyes:     Conjunctiva/sclera: Conjunctivae normal.     Pupils: Pupils are equal, round, and reactive to light.     Comments: Pt not able to follow directions for EOM checks.   Cardiovascular:     Rate  and Rhythm: Regular rhythm. Tachycardia present.     Pulses: Normal pulses.     Heart sounds: Normal heart sounds.  Pulmonary:     Effort: Pulmonary effort is normal.     Breath sounds: Normal breath sounds. No wheezing.  Abdominal:     General: Bowel sounds are normal. There is no distension.     Palpations: Abdomen is soft.     Tenderness: There is no abdominal tenderness. There is no guarding.  Musculoskeletal:        General: No tenderness. Normal range of motion.     Cervical back: Normal range of motion.  Skin:    General: Skin is warm and dry.  Neurological:     Mental Status: She is alert.     Comments: Left upper extremity neglect. Will not grip with left hand.  Weakness of left upper extremity but resists gravity equally bilateral upper extremities.  Attempt to perform pronator drift by lifting her arms into position - both arms drift toward the center.  Flex/ext of toes/ankles intact.  Unable to follow instructions for meaningful cranial nerve or coordination evaluation.       ED Results / Procedures / Treatments   Labs (all labs ordered are listed, but only abnormal results are displayed) Labs Reviewed  APTT - Abnormal; Notable for the following components:      Result Value   aPTT 23 (*)    All other components within normal limits  COMPREHENSIVE METABOLIC PANEL - Abnormal; Notable for the following components:   Potassium 3.3 (*)    Glucose, Bld 131 (*)    BUN 59 (*)    Creatinine, Ser 3.26 (*)    GFR calc non Af Amer 14 (*)    GFR calc Af Amer 17 (*)    Anion gap 16 (*)    All other components within normal limits  RAPID URINE DRUG SCREEN, HOSP PERFORMED - Abnormal; Notable for the following components:   Benzodiazepines POSITIVE (*)    All other components within normal limits  URINALYSIS, ROUTINE W REFLEX MICROSCOPIC - Abnormal; Notable for the following components:   APPearance HAZY (*)    Hgb urine dipstick SMALL (*)    Protein, ur 30 (*)     Leukocytes,Ua TRACE (*)    Bacteria, UA RARE (*)    All other components within normal limits  CBG MONITORING, ED - Abnormal; Notable for the following components:   Glucose-Capillary 116 (*)    All other components within normal limits  I-STAT CHEM 8, ED - Abnormal; Notable for the following components:   Potassium 3.2 (*)    BUN 49 (*)    Creatinine, Ser 3.40 (*)    Glucose, Bld 124 (*)    All other components within normal limits  ETHANOL  PROTIME-INR  CBC  DIFFERENTIAL    EKG EKG Interpretation  Date/Time:  Tuesday October 23 2019 14:23:48 EST Ventricular Rate:  98 PR Interval:    QRS Duration: 91 QT Interval:  391 QTC Calculation: 500 R Axis:   46 Text Interpretation: Sinus rhythm Borderline prolonged QT interval Baseline wander in lead(s) I aVR No significant change was found Confirmed by Ezequiel Essex [phone]) on 10/23/2019 2:56:06 PM  Radiology CT HEAD CODE STROKE WO CONTRAST  Result Date: 10/23/2019 CLINICAL DATA:  Code stroke.  Left-sided weakness EXAM: CT HEAD WITHOUT CONTRAST TECHNIQUE: Contiguous axial images were obtained from the base of the skull through the vertex without intravenous contrast. COMPARISON:  08/12/2019 FINDINGS: Brain: There is no acute intracranial hemorrhage, mass effect, or edema. Gray-white differentiation remains preserved. Minimal patchy hypoattenuation in the supratentorial white matter is nonspecific but may reflect minor chronic microvascular ischemic changes. Ventricles and sulci are stable in size and configuration. Vascular: No hyperdense vessel. There is intracranial atherosclerotic calcification at the skull base. Skull: Unremarkable. Sinuses/Orbits: The imaged paranasal sinuses are clear. Visualized orbits are unremarkable. Other: Mastoid air cells are clear. ASPECTS ([place] Stroke Program Early CT Score) - Ganglionic level infarction (caudate, lentiform nuclei, internal capsule, insula, M1-M3 cortex): 7 - Supraganglionic infarction  (M4-M6 cortex): 3 Total score (0-10 with 10 being normal): 10 IMPRESSION: No acute intracranial hemorrhage or evidence of acute infarction. ASPECT score is 10. These results were called by telephone at the time of interpretation on 10/23/2019 at 3:03 pm to provider [facility] , who verbally acknowledged these results. Electronically Signed   By: Macy Mis M.D.   On: 10/23/2019 15:07    Procedures Procedures (including critical care time)  Medications Ordered in ED Medications  ondansetron (ZOFRAN) injection 4 mg (4 mg Intravenous Given 10/23/19 1505)    ED Course  I have reviewed the triage vital signs and the nursing notes.  Pertinent labs & imaging results that were available during my care of the patient were reviewed by me and considered in my medical decision making (see chart for details).    MDM Rules/Calculators/A&P                      Pt with confusion with left sided weakness upon waking from nap, last known well 10 am this am. H/o recent head injury, no subdural/acute bleeding. Pt was evaluated by teleneurology, LVO not suspected, pt also not a candidate for tpa.  Recommends CTA of head/neck if creatinine amenable, admission for cva work up.    Creatinine significantly elevated at 3.26, will not be able to undergo CTA studies. Call placed for admission for further cva work up.    MRI ordered.    Call to Dr. Denton Brick who accepts pt for admission.    CRITICAL CARE Performed by: Evalee Jefferson Total critical care time: 45 minutes Critical care time was exclusive of separately billable procedures and treating other patients. Critical care was necessary to treat or prevent imminent or life-threatening deterioration. Critical care was time spent personally by me on the following activities: development of treatment plan with patient and/or surrogate as well as nursing, discussions with consultants, evaluation of patient's response to treatment, examination of patient,  obtaining history from patient or surrogate, ordering and performing treatments and interventions, ordering and review of laboratory studies, ordering and review of radiographic studies, pulse oximetry and re-evaluation of patient's condition.   Final Clinical Impression(s) / ED Diagnoses Final diagnoses:  Acute renal failure, unspecified acute renal failure type [facility])  Cerebrovascular accident (CVA), unspecified mechanism [facility])    Rx / [place] Orders ED Discharge Orders    None       Landis Martins 10/23/19 1809    Evalee Jefferson, PA-C 10/23/19 1818    Ezequiel Essex, MD 10/23/19 2041

## 2019-10-23 NOTE — H&P (Signed)
History and Physical    Linda English [id number] DOB: 1956/05/29 DOA: 10/23/2019  PCP: [name], MD   Patient coming from: Home  I have personally briefly reviewed patient's old medical records in [place]  Chief Complaint: Left-sided neglect, left-sided weakness  HPI: Linda English is a 64 y.o. female with medical history significant for diabetes mellitus, Parkinson's disease, subdural hematoma, hypertension, bipolar disorder. History is mostly obtained from chart review, and the time of my evaluation patient is awake and alert but she gives mostly yes answers to all questions.  Patient was woken up at about 1:30 PM for her physical therapy.  Per her physical therapist patient seemed weaker, with left side weakness and neglect.  Daughter also felt patient's was less alert than normal and had some confusion.  Reported poor p.o. intake in the past few weeks. Family also reported that patient's fell yesterday and hit the back of her head but seemed okay yesterday.  He also reported frequent regurgitation, for which she has had an EGD to evaluate and was unremarkable.  ED Course: T-max later in the evening 100, heart rate 80s to 103.  Sats 100% on room air.  WBC 9.  Potassium 3.3.  Creatinine elevated at 3.26.  UA showed trace leukocytes rare bacteria 11-20 squames.  Code stroke was called with MRI brain, MRA head and neck, unremarkable.  Hospitalist admit for further evaluation and management.  Review of Systems: Unable to fully ascertain, answering yes to all questions.  Past Medical History:  Diagnosis Date  . Acid reflux   . Allergic rhinitis   . Anxiety   . Arthritis    osteoarthritis  . Bipolar disorder ([place])   . Chronic headache   . Depression   . Diverticula of colon   . DM (diabetes mellitus) ([place])   . Gastric erosions   . Gastric polyps    benign   . Hemorrhoids   . Hiatal hernia   . Hypertension   . IBS (irritable bowel syndrome)   .  Lichen planus   . Obstructive sleep apnea   . Panic disorder   . Parkinson's disease ([place])   . Rectal cancer ([place]) 2003   ileostomy and reversal  . Subdural hematoma ([place])   . Tubular adenoma     Past Surgical History:  Procedure Laterality Date  . ABDOMINAL HYSTERECTOMY    . APPENDECTOMY    . BACK SURGERY    . BIOPSY  10/01/2019   Procedure: BIOPSY;  Surgeon: Daneil Dolin, MD;  [facility];  Service: Endoscopy;;  gastric  . CESAREAN SECTION     X  2  . CHOLECYSTECTOMY    . COLONOSCOPY  08/2007   DR Gala Romney, friable anal canal hemorrhoids, surgical anastomosis at 3cm, distal scattered tics  . COLONOSCOPY  12/15/2010   anal papilla and internal hemorrhoids/diminutive polyp in the base of the cecum. Past, tubular adenoma.. Next colonoscopy due in April 2017.  Marland Kitchen COLONOSCOPY  10/27/2005   RMR: Anal canal hemorrhoids. Surgical anastomosis at 3 cm from the anal verge appeared normal. Few scattered distal diverticula. The residual  colonic mucosa appeared normal. I suspect the patient bled from hemorrhoids  . COLONOSCOPY N/A 01/15/2015   [id number] residual rectum and colon. next tcs 12/2019  . ESOPHAGEAL DILATION N/A 06/27/2015   Procedure: ESOPHAGEAL DILATION;  Surgeon: Daneil Dolin, MD;  [facility];  Service: Endoscopy;  Laterality: N/A;  . ESOPHAGOGASTRODUODENOSCOPY  05/2002   DR  ROURK, normal  . ESOPHAGOGASTRODUODENOSCOPY  08/03/2011   RMR: small HH/ gastric polyps  . ESOPHAGOGASTRODUODENOSCOPY N/A 06/27/2015   Dr. Gala Romney: Mild erosive reflux esophagitits. Status post passage of a Maloney dilator. Hiatal hernia. Gastric polyps and abnormal gastric mucosa of doubtful clinical significane. status post biopsy, benign fundic gland polyp, negative H.pylori  . ESOPHAGOGASTRODUODENOSCOPY (EGD) WITH PROPOFOL N/A 10/01/2019   Procedure: ESOPHAGOGASTRODUODENOSCOPY (EGD) WITH PROPOFOL;  Surgeon: Daneil Dolin, MD;  [facility];  Service: Endoscopy;  Laterality:  N/A;  1:15pm  . low anterior rection  2003   RECTAL CANCER  . MALONEY DILATION N/A 10/01/2019   Procedure: Venia Minks DILATION;  Surgeon: Daneil Dolin, MD;  [facility];  Service: Endoscopy;  Laterality: N/A;  . SPINE SURGERY  2001   L5,S1 HEMILAMINOTOMY AND DISCECTOMY/DR DEATON     reports that she has never smoked. She has never used smokeless tobacco. She reports that she does not drink alcohol or use drugs.  Allergies  Allergen Reactions  . Shellfish Allergy Anaphylaxis  . Lithium Nausea And Vomiting  . Excedrin Extra Strength [Aspirin-Acetaminophen-Caffeine] Other (See Comments)    Makes her nervous.  Bobbye Charleston [Clonazepam] Other (See Comments)    hallucinations    Family History  Problem Relation Age of Onset  . Colon cancer Paternal Grandfather 10  . Colon polyps Brother   . Colon polyps Cousin     Prior to Admission medications   Medication Sig Start Date End Date Taking? Authorizing Provider  ALPRAZolam Duanne Moron) 0.5 MG tablet Take 1 tablet (0.5 mg total) by mouth 3 (three) times daily as needed for anxiety or sleep. 07/03/19  Yes Christopher Glasscock, Cour[place], MD  amLODipine (NORVASC) 5 MG tablet Take 1 tablet (5 mg total) by mouth daily. 07/04/19  Yes Jolee Critcher, Courage, MD  chlorthalidone (HYGROTON) 25 MG tablet Take 25 mg by mouth every morning. 09/17/19  Yes [provider]  erythromycin (E-MYCIN) 250 MG tablet Take 250 mg by mouth 4 (four) times daily.   Yes [provider]  insulin aspart (NOVOLOG) 100 UNIT/ML injection insulin aspart (novoLOG) injection 0-9 Units 0-9 Units, Subcutaneous, 3 times daily with meals, CBG < 70: Implement Hypoglycemia Standing Orders and refer to Hypoglycemia Standing Orders sidebar report  CBG 70 - 120: 0 units CBG 121 - 150: 1 unit CBG 151 - 200: 2 units CBG 201 - 250: 3 units CBG 251 - 300: 5 units CBG 301 - 350: 7 units CBG 351 - 400: 9 units CBG > 400: 07/03/19 07/02/20 Yes Maud Rubendall, Courage, MD  lamoTRIgine (LAMICTAL) 200 MG  tablet Take 200 mg by mouth 2 (two) times daily.  06/11/13  Yes [provider]  linaclotide (LINZESS) 72 MCG capsule Take 1 capsule (72 mcg total) by mouth daily before breakfast. Patient taking differently: Take 72 mcg by mouth daily as needed (for constipation).  09/04/19  Yes Annitta Needs, NP  metFORMIN (GLUCOPHAGE-XR) 500 MG 24 hr tablet Take 500 mg by mouth 2 (two) times daily. 06/13/12  Yes Darrol Jump, MD  metoprolol tartrate (LOPRESSOR) 25 MG tablet Take 1 tablet (25 mg total) by mouth 2 (two) times daily. 07/03/19  Yes Talvin Christianson, Courage, MD  midodrine (PROAMATINE) 10 MG tablet Take 10 mg by mouth 3 (three) times daily. 09/29/19  Yes [provider]  ondansetron (ZOFRAN) 4 MG tablet Take 1 tablet (4 mg total) by mouth 3 (three) times daily before meals. 09/25/19  Yes Carlis Stable, NP  pantoprazole (PROTONIX) 40  MG tablet Take 40 mg by mouth 2 (two) times daily before a meal.   Yes [provider]  Powders (VAGISIL DEODORANT) POWD Apply 1 application topically daily as needed (for skin irritation).   Yes [provider]  ondansetron (ZOFRAN) 4 MG tablet Take 1 tablet (4 mg total) by mouth every 8 (eight) hours as needed for nausea or vomiting. Patient not taking: Reported on 10/23/2019 09/25/19   Carlis Stable, NP    Physical Exam: Vitals:   10/23/19 1830 10/23/19 1915 10/23/19 2013 10/23/19 2050  BP: 131/76  129/78 132/74  Pulse: 92 (!) 103 95 (!) 101  Resp: 15 (!) 27 18 20   Temp:    100 F (37.8 C)  TempSrc:    Oral  SpO2: (!) 86% 100% 99% 100%  Weight:      Height:        Constitutional: NAD, calm, comfortable Vitals:   10/23/19 1830 10/23/19 1915 10/23/19 2013 10/23/19 2050  BP: 131/76  129/78 132/74  Pulse: 92 (!) 103 95 (!) 101  Resp: 15 (!) 27 18 20   Temp:    100 F (37.8 C)  TempSrc:    Oral  SpO2: (!) 86% 100% 99% 100%  Weight:      Height:       Eyes: PERRL, lids and conjunctivae normal ENMT: Mucous membranes are dry Neck:  normal, supple, no masses, no thyromegaly Respiratory:  Normal respiratory effort. No accessory muscle use.  Cardiovascular: Regular rate and rhythm,No extremity edema. 2+ pedal pulses.  Abdomen: no tenderness, no masses palpated. No hepatosplenomegaly. Bowel sounds positive.  Musculoskeletal: no clubbing / cyanosis. No joint deformity upper and lower extremities. Good ROM, no contractures. .  Skin: no rashes, lesions, ulcers. No induration Neurologic: Cranial nerve III to XII intact, with no facial asymmetry, normal extraocular movements of the eyes bilaterally, moving all extremities spontaneously with 4+/5 strength in all extremities.  Sensation appears intact. Psychiatric: Normal judgment and insight. Alert and oriented to person and place but not situation.. Normal mood.   Labs on Admission: I have personally reviewed following labs and imaging studies  CBC: Recent Labs  Lab 10/23/19 1459 10/23/19 1540  WBC 9.0  --   NEUTROABS 6.2  --   HGB 12.3 12.9  HCT 38.2 38.0  MCV 85.1  --   PLT 314  --    Basic Metabolic Panel: Recent Labs  Lab 10/23/19 1459 10/23/19 1540  NA 140 141  K 3.3* 3.2*  CL 98 101  CO2 26  --   GLUCOSE 131* 124*  BUN 59* 49*  CREATININE 3.26* 3.40*  CALCIUM 9.8  --    Liver Function Tests: Recent Labs  Lab 10/23/19 1459  AST 20  ALT 12  ALKPHOS 90  BILITOT 0.5  PROT 7.8  ALBUMIN 4.6   Coagulation Profile: Recent Labs  Lab 10/23/19 1459  INR 1.0   CBG: Recent Labs  Lab 10/23/19 1435  GLUCAP 116*   Urine analysis:    Component Value Date/Time   COLORURINE YELLOW 10/23/2019 1610   APPEARANCEUR HAZY (A) 10/23/2019 1610   LABSPEC 1.016 10/23/2019 1610   PHURINE 5.0 10/23/2019 1610   GLUCOSEU NEGATIVE 10/23/2019 1610   HGBUR SMALL (A) 10/23/2019 1610   BILIRUBINUR NEGATIVE 10/23/2019 1610   KETONESUR NEGATIVE 10/23/2019 1610   PROTEINUR 30 (A) 10/23/2019 1610   UROBILINOGEN 1.0 06/07/2012 2238   NITRITE NEGATIVE 10/23/2019 1610     LEUKOCYTESUR TRACE (A) 10/23/2019 1610  Radiological Exams on Admission: MR ANGIO HEAD WO CONTRAST  Result Date: 10/23/2019 CLINICAL DATA:  Focal neuro deficit.  Fall yesterday. EXAM: MRA HEAD WITHOUT CONTRAST TECHNIQUE: Angiographic images of the Circle of Willis were obtained using MRA technique without intravenous contrast. COMPARISON:  CTA head and neck 08/09/2015 FINDINGS: Both vertebral arteries patent to the basilar. PICA patent bilaterally. Basilar widely patent. Posterior cerebral arteries widely patent bilaterally. Internal carotid artery widely patent bilaterally. Hypoplastic left A1 segment. Both anterior cerebral ups are patent and supplied from the right. Middle cerebral arteries patent bilaterally without stenosis or large vessel occlusion Negative for cerebral aneurysm. IMPRESSION: Negative MRA head Electronically Signed   By: Franchot Gallo M.D.   On: 10/23/2019 20:31   MR ANGIO NECK WO CONTRAST  Result Date: 10/23/2019 CLINICAL DATA:  Neuro deficit EXAM: MRA NECK WITHOUT CONTRAST TECHNIQUE: Angiographic images of the neck were obtained using MRA technique without intravenous contrast. Carotid stenosis measurements (when applicable) are obtained utilizing NASCET criteria, using the distal internal carotid diameter as the denominator. COMPARISON:  CT a head and neck 08/09/2015. FINDINGS: MRA NECK FINDINGS Both vertebral arteries are patent with antegrade flow. Moderate stenosis proximal left vertebral artery. Left vertebral artery was widely patent on prior CTA raising the possibility of artifact on today's study. Right vertebral artery widely patent Carotid bifurcation widely patent bilaterally without stenosis or irregularity IMPRESSION: Both carotid arteries widely patent. Moderate stenosis proximal left vertebral artery. Electronically Signed   By: Franchot Gallo M.D.   On: 10/23/2019 20:33   MR BRAIN WO CONTRAST  Result Date: 10/23/2019 CLINICAL DATA:  Focal neuro deficit.   Fall yesterday EXAM: MRI HEAD WITHOUT CONTRAST TECHNIQUE: Multiplanar, multiecho pulse sequences of the brain and surrounding structures were obtained without intravenous contrast. COMPARISON:  CT head today. FINDINGS: Brain: Negative for acute infarct. Mild atrophy without hydrocephalus. Negative for hemorrhage mass or fluid collection. Vascular: Normal arterial flow voids. Skull and upper cervical spine: No focal skeletal lesion. Sinuses/Orbits: Negative Other: None IMPRESSION: Generalized atrophy without acute abnormality. Electronically Signed   By: Franchot Gallo M.D.   On: 10/23/2019 20:24   CT HEAD CODE STROKE WO CONTRAST  Result Date: 10/23/2019 CLINICAL DATA:  Code stroke.  Left-sided weakness EXAM: CT HEAD WITHOUT CONTRAST TECHNIQUE: Contiguous axial images were obtained from the base of the skull through the vertex without intravenous contrast. COMPARISON:  08/12/2019 FINDINGS: Brain: There is no acute intracranial hemorrhage, mass effect, or edema. Gray-white differentiation remains preserved. Minimal patchy hypoattenuation in the supratentorial white matter is nonspecific but may reflect minor chronic microvascular ischemic changes. Ventricles and sulci are stable in size and configuration. Vascular: No hyperdense vessel. There is intracranial atherosclerotic calcification at the skull base. Skull: Unremarkable. Sinuses/Orbits: The imaged paranasal sinuses are clear. Visualized orbits are unremarkable. Other: Mastoid air cells are clear. ASPECTS ([place] Stroke Program Early CT Score) - Ganglionic level infarction (caudate, lentiform nuclei, internal capsule, insula, M1-M3 cortex): 7 - Supraganglionic infarction (M4-M6 cortex): 3 Total score (0-10 with 10 being normal): 10 IMPRESSION: No acute intracranial hemorrhage or evidence of acute infarction. ASPECT score is 10. These results were called by telephone at the time of interpretation on 10/23/2019 at 3:03 pm to provider [facility] , who  verbally acknowledged these results. Electronically Signed   By: Macy Mis M.D.   On: 10/23/2019 15:07    EKG: Independently reviewed.  Sinus rhythm, QTC prolonged at 500.  No significant ST or T wave changes compared to prior EKG.  Assessment/Plan Principal  Problem:   Acute metabolic encephalopathy Active Problems:   Diabetes ([place])   Essential hypertension   Bipolar 1 disorder ([facility])   Rt Sided Subdural hematoma, post-traumatic    Metabolic encephalopathy-initial reported left-sided neglect and weakness, neither of which are evident on my exam.  Presentation most suggestive of altered mental status, with AKI as obvious etiology.  UA trace leukocytes with 11-20 squames.  Reported history of regurgitation.  T-max now 100.  WBC 9.  Rules out for sepsis.  At this time we will hold off on antibiotics as no focus of infection identified and patient is not septic.  MRA-head and neck, MRI brain negative for stroke, or large vessel occlusions.. - Obtain blood culture - Obtain portable chest x-ray - IVF N/s  -Repeat UA, obtain urine cultures - COVID PCR pending.  Acute kidney injury-creatinine 3.26, baseline 0.9-1.1.  Likely prerenal with poor recent p.o. intake, and chlorthalidone use - N/s 100cc/hr x 15hrs - BMP a.m -Hold home chlorthalidone  Prolonged QTC- 500, potassium 3.3 -Check magnesium -Replete K  History of Parkinson's disease, subdural hemorrhage- head CT today and brain MRI today negative for stroke. Follows with Dr. Merlene Laughter.   -Resume home lamotrigine  Hypertension-systolic [id number].  Benazepril and chlorthalidone discontinued on recent hospitalization. -Resume home Norvasc, metoprolol. -Hold home chlorthalidone  Depression-stable. -Resume home lamotrigine, trazodone.  Dyslipidemia -Resume home statins  Rectal cancer s/p resection in 2003. Colonoscopy May 2016 with normal residual rectum and colon   DVT prophylaxis: Heparin Code Status: Full code Family  Communication: None at bedside Disposition Plan:  Pending improvement in mental status. Consults called: None Admission status: Obs, Tele    Bethena Roys MD [facility]  10/23/2019, 10:32 PM

## 2019-10-23 NOTE — Consult Note (Addendum)
[facility]rology Consult Services   Date of Service:   10/23/2019 14:59:09  Impression:     .  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  Comments/Sign-Out: LKW 1030 am when she went to sleep, woke up by therapy and found to have L sided neglect and weakness; h/o PD, DM, HTN; CT head NEG for acute lesions; NIHSS notable for delayed speech, mild L face/arm/leg drift noted; h/o traumatic SDH from Oct 2020 NOT tPA candidate b/c of h/o traumatic SDH recently; ddx stroke, seizure, TIA  Metrics: Last Known Well: 10/23/2019 10:300:00 [facility] Notification Time: 10/23/2019 14:59:09 Arrival Time: 10/23/2019 14:59:00 Stamp Time: 10/23/2019 14:59:09 Time First Login Attempt: 10/23/2019 15:05:44 Symptoms: confusion, L sided weakness NIHSS Start Assessment Time: 10/23/2019 15:06:22 Patient is not a candidate for Alteplase/Activase. Alteplase Medical Decision: 10/23/2019 15:06:26 Patient was not deemed candidate for Alteplase/Activase thrombolytics because of Current or Previous ICH.  CT head showed no acute hemorrhage or acute core infarct.  Lower Likelihood of Large Vessel Occlusion but Following Stat Studies are Recommended  CTA Head and Neck.   ED Physician notified of diagnostic impression and management plan on 10/23/2019 15:22:53  Our recommendations are outlined below.  Recommendations:     .  Activate Stroke Protocol Admission/Order Set     .  Stroke/Telemetry Floor     .  Neuro Checks     .  Bedside Swallow Eval     .  DVT Prophylaxis     .  IV Fluids, Normal Saline     .  Head of Bed 30 Degrees     .  Euglycemia and Avoid Hyperthermia (PRN Acetaminophen)     .  Antiplatelet Therapy Recommended     .  admit for TIA/stroke evaluation; check swallow; give ASA 325 now; IV fluids with NS; permissive HTN SBP 140-200, dvt prophy is ok; check cbc, bmp, tsh, lipid panel, hga1c, u/a, CXR  Routine Consultation with  [facility] for Follow up Care  Sign Out:     .  Discussed with Emergency Department Provider    ------------------------------------------------------------------------------  History of Present Illness: Patient is a 64 year old Female.  Patient was brought by EMS for symptoms of confusion, L sided weakness  LKW 1030 am when she went to sleep, woke up by therapy and found to have L sided neglect and weakness; h/o PD, DM, HTN; CT head NEG for acute lesions; NIHSS notable for delayed speech, mild L face/arm/leg drift noted; h/o traumatic SDH from Oct 2020           Examination: BP(SBP 140-200), Pulse(<120), Blood Glucose(<110) 1A: Level of Consciousness - Alert; keenly responsive + 0 1B: Ask Month and Age - 1 Question Right + 1 1C: Blink Eyes & Squeeze Hands - Performs Both Tasks + 0 2: Test Horizontal Extraocular Movements - Normal + 0 3: Test Visual Fields - No Visual Loss + 0 4: Test Facial Palsy (Use Grimace if Obtunded) - Minor paralysis (flat nasolabial fold, smile asymmetry) + 1 5A: Test Left Arm Motor Drift - Drift, but doesn't hit bed + 1 5B: Test Right Arm Motor Drift - No Drift for 10 Seconds + 0 6A: Test Left Leg Motor Drift - Drift, but doesn't hit bed + 1 6B: Test Right Leg Motor Drift - No Drift for 5 Seconds + 0 7: Test Limb Ataxia (FNF/Heel-Shin) - No Ataxia + 0 8: Test Sensation - Normal; No sensory loss + 0 9: Test Language/Aphasia - Mild-Moderate  Aphasia: Some Obvious Changes, Without Significant Limitation + 1 10: Test Dysarthria - Mild-Moderate Dysarthria: Slurring but can be understood + 1 11: Test Extinction/Inattention - Visual/tactile/auditory/spatial/personal inattention + 1  NIHSS Score: 7  Pre-Morbid Modified Ranking Scale: 0 Points = No symptoms at all   Patient/Family was informed the Neurology Consult would occur via TeleHealth consult by way of interactive audio and video telecommunications and consented to receiving care in this  manner.   Due to the immediate potential for life-threatening deterioration due to underlying acute neurologic illness, I spent 30 minutes providing critical care. This time includes time for face to face visit via telemedicine, review of medical records, imaging studies and discussion of findings with providers, the patient and/or family.   Dr Agustin Cree   [facility] [phone]  Case [id number]

## 2019-10-24 ENCOUNTER — Observation Stay (HOSPITAL_COMMUNITY): Payer: Medicare Other

## 2019-10-24 ENCOUNTER — Inpatient Hospital Stay (HOSPITAL_COMMUNITY)
Admit: 2019-10-24 | Discharge: 2019-10-24 | Disposition: A | Discharge status: Home / Self Care | Payer: Medicare Other | Attending: Internal Medicine | Admitting: Internal Medicine

## 2019-10-24 DIAGNOSIS — R112 Nausea with vomiting, unspecified: Secondary | ICD-10-CM | POA: Diagnosis not present

## 2019-10-24 DIAGNOSIS — Z7984 Long term (current) use of oral hypoglycemic drugs: Secondary | ICD-10-CM | POA: Diagnosis not present

## 2019-10-24 DIAGNOSIS — F319 Bipolar disorder, unspecified: Secondary | ICD-10-CM | POA: Diagnosis present

## 2019-10-24 DIAGNOSIS — I471 Supraventricular tachycardia: Secondary | ICD-10-CM | POA: Diagnosis present

## 2019-10-24 DIAGNOSIS — G20 Parkinson's disease: Secondary | ICD-10-CM | POA: Diagnosis present

## 2019-10-24 DIAGNOSIS — I129 Hypertensive chronic kidney disease with stage 1 through stage 4 chronic kidney disease, or unspecified chronic kidney disease: Secondary | ICD-10-CM | POA: Diagnosis present

## 2019-10-24 DIAGNOSIS — Z800 Family history of malignant neoplasm of digestive organs: Secondary | ICD-10-CM | POA: Diagnosis not present

## 2019-10-24 DIAGNOSIS — N179 Acute kidney failure, unspecified: Principal | ICD-10-CM

## 2019-10-24 DIAGNOSIS — N1831 Chronic kidney disease, stage 3a: Secondary | ICD-10-CM

## 2019-10-24 DIAGNOSIS — R296 Repeated falls: Secondary | ICD-10-CM | POA: Diagnosis present

## 2019-10-24 DIAGNOSIS — E1143 Type 2 diabetes mellitus with diabetic autonomic (poly)neuropathy: Secondary | ICD-10-CM | POA: Diagnosis present

## 2019-10-24 DIAGNOSIS — E785 Hyperlipidemia, unspecified: Secondary | ICD-10-CM | POA: Diagnosis present

## 2019-10-24 DIAGNOSIS — G9341 Metabolic encephalopathy: Secondary | ICD-10-CM | POA: Diagnosis present

## 2019-10-24 DIAGNOSIS — K219 Gastro-esophageal reflux disease without esophagitis: Secondary | ICD-10-CM | POA: Diagnosis present

## 2019-10-24 DIAGNOSIS — R414 Neurologic neglect syndrome: Secondary | ICD-10-CM | POA: Diagnosis present

## 2019-10-24 DIAGNOSIS — Z8673 Personal history of transient ischemic attack (TIA), and cerebral infarction without residual deficits: Secondary | ICD-10-CM | POA: Diagnosis not present

## 2019-10-24 DIAGNOSIS — Z681 Body mass index (BMI) 19 or less, adult: Secondary | ICD-10-CM | POA: Diagnosis not present

## 2019-10-24 DIAGNOSIS — R29707 NIHSS score 7: Secondary | ICD-10-CM | POA: Diagnosis present

## 2019-10-24 DIAGNOSIS — I340 Nonrheumatic mitral (valve) insufficiency: Secondary | ICD-10-CM | POA: Diagnosis not present

## 2019-10-24 DIAGNOSIS — R634 Abnormal weight loss: Secondary | ICD-10-CM | POA: Diagnosis not present

## 2019-10-24 DIAGNOSIS — Z8371 Family history of colonic polyps: Secondary | ICD-10-CM | POA: Diagnosis not present

## 2019-10-24 DIAGNOSIS — I10 Essential (primary) hypertension: Secondary | ICD-10-CM | POA: Diagnosis not present

## 2019-10-24 DIAGNOSIS — E1122 Type 2 diabetes mellitus with diabetic chronic kidney disease: Secondary | ICD-10-CM | POA: Diagnosis present

## 2019-10-24 DIAGNOSIS — E43 Unspecified severe protein-calorie malnutrition: Secondary | ICD-10-CM | POA: Diagnosis present

## 2019-10-24 DIAGNOSIS — R531 Weakness: Secondary | ICD-10-CM | POA: Diagnosis present

## 2019-10-24 DIAGNOSIS — G4733 Obstructive sleep apnea (adult) (pediatric): Secondary | ICD-10-CM | POA: Diagnosis present

## 2019-10-24 DIAGNOSIS — I351 Nonrheumatic aortic (valve) insufficiency: Secondary | ICD-10-CM | POA: Diagnosis not present

## 2019-10-24 DIAGNOSIS — E860 Dehydration: Secondary | ICD-10-CM | POA: Diagnosis present

## 2019-10-24 DIAGNOSIS — N183 Chronic kidney disease, stage 3 unspecified: Secondary | ICD-10-CM | POA: Diagnosis present

## 2019-10-24 DIAGNOSIS — Z20822 Contact with and (suspected) exposure to covid-19: Secondary | ICD-10-CM | POA: Diagnosis present

## 2019-10-24 DIAGNOSIS — W19XXXA Unspecified fall, initial encounter: Secondary | ICD-10-CM | POA: Diagnosis present

## 2019-10-24 LAB — BASIC METABOLIC PANEL
Anion gap: 13 (ref 5–15)
BUN: 60 mg/dL — ABNORMAL HIGH (ref 8–23)
CO2: 26 mmol/L (ref 22–32)
Calcium: 9.3 mg/dL (ref 8.9–10.3)
Chloride: 102 mmol/L (ref 98–111)
Creatinine, Ser: 3.25 mg/dL — ABNORMAL HIGH (ref 0.44–1.00)
GFR calc Af Amer: 17 mL/min — ABNORMAL LOW (ref >60)
GFR calc non Af Amer: 15 mL/min — ABNORMAL LOW (ref >60)
Glucose, Bld: 108 mg/dL — ABNORMAL HIGH (ref 70–99)
Potassium: 3.7 mmol/L (ref 3.5–5.1)
Sodium: 141 mmol/L (ref 135–145)

## 2019-10-24 LAB — HEMOGLOBIN A1C
Hgb A1c MFr Bld: 5.8 % — ABNORMAL HIGH (ref 4.8–5.6)
Mean Plasma Glucose: 119.76 mg/dL

## 2019-10-24 LAB — GLUCOSE, CAPILLARY
Glucose-Capillary: 93 mg/dL (ref 70–99)
Glucose-Capillary: 122 mg/dL — ABNORMAL HIGH (ref 70–99)
Glucose-Capillary: 119 mg/dL — ABNORMAL HIGH (ref 70–99)
Glucose-Capillary: 98 mg/dL (ref 70–99)

## 2019-10-24 LAB — AMMONIA: Ammonia: 21 umol/L (ref 9–35)

## 2019-10-24 LAB — SARS CORONAVIRUS 2 (TAT 6-24 HRS): SARS Coronavirus 2: NEGATIVE

## 2019-10-24 LAB — FOLATE: Folate: 8.1 ng/mL (ref >5.9)

## 2019-10-24 LAB — VITAMIN B12: Vitamin B-12: 349 pg/mL (ref 180–914)

## 2019-10-24 MED ORDER — POTASSIUM CHLORIDE IN NACL 20-0.9 MEQ/L-% IV SOLN: INTRAVENOUS | Status: DC

## 2019-10-24 MED ORDER — ADULT MULTIVITAMIN W/MINERALS CH
1.0000 | ORAL_TABLET | Freq: Every day | ORAL | Status: DC
  Administered 2019-10-25 – 2019-10-26 (×2): 1 via ORAL
  Filled 2019-10-24 (×2): qty 1

## 2019-10-24 MED ORDER — ACETAMINOPHEN 325 MG PO TABS: 650.0000 mg | ORAL_TABLET | Freq: Four times a day (QID) | ORAL | Status: DC | PRN

## 2019-10-24 MED ORDER — ONDANSETRON HCL 4 MG/2ML IJ SOLN
4.0000 mg | Freq: Three times a day (TID) | INTRAMUSCULAR | Status: DC
  Administered 2019-10-24 – 2019-10-26 (×7): 4 mg via INTRAVENOUS
  Filled 2019-10-24 (×7): qty 2

## 2019-10-24 NOTE — Progress Notes (Signed)
EEG completed, results pending. 

## 2019-10-24 NOTE — Evaluation (Signed)
Physical Therapy Evaluation Patient Details Name: SHARNICE KLAUCK MRN: [id number] DOB: [birth year] Today's Date: 10/24/2019   History of Present Illness  Emily H Kueck is a 64 y.o. female with medical history significant for diabetes mellitus, Parkinson's disease, subdural hematoma, hypertension, bipolar disorder.History is mostly obtained from chart review, and the time of my evaluation patient is awake and alert but she gives mostly yes answers to all questions.  Patient was woken up at about 1:30 PM for her physical therapy.  Per her physical therapist patient seemed weaker, with left side weakness and neglect.  Daughter also felt patient's was less alert than normal and had some confusion.  Reported poor p.o. intake in the past few weeks. Family also reported that patient's fell yesterday and hit the back of her head but seemed okay yesterday.  He also reported frequent regurgitation, for which she has had an EGD to evaluate and was unremarkable  Clinical Impression  Ms. Kennemer is a 64 yo female who states that she lives with her adopted daughter who is 68.  She fell at home and appeared to be weaker, however, at this time this weakness has resolved.  She takes slow shuffling steps and will need 24 hour supervision.     Follow Up Recommendations Home health PT;Supervision/Assistance - 24 hour    Equipment Recommendations  None recommended by PT    Recommendations for Other Services  social work.     Precautions / Restrictions Precautions Precautions: Fall Restrictions Weight Bearing Restrictions: No      Mobility  Bed Mobility Overal bed mobility: Independent                Transfers Overall transfer level: Independent Equipment used: Rolling walker (2 wheeled)                Ambulation/Gait Ambulation/Gait assistance: Counsellor (Feet): 25 Feet Assistive device: Rolling walker (2 wheeled) Gait Pattern/deviations: Decreased step  length - right;Decreased step length - left Gait velocity: slow      Stairs            Wheelchair Mobility    Modified Rankin (Stroke Patients Only)       Balance Overall balance assessment: Needs assistance Sitting-balance support: Bilateral upper extremity supported                                         Pertinent Vitals/Pain Pain Assessment: No/denies pain    Home Living    apartment, no steps  Has a walker and a wheelchair.  Uses the wheelchair most of the time.                    Prior Function   see above                 Extremity/Trunk Assessment        Lower Extremity Assessment Lower Extremity Assessment: Overall WFL for tasks assessed       Communication    I  Cognition Arousal/Alertness: Awake/alert Behavior During Therapy: WFL for tasks assessed/performed Overall Cognitive Status: Within Functional Limits for tasks assessed                                               Assessment/Plan  PT Assessment Patient needs continued PT services  PT Problem List Decreased activity tolerance;Decreased balance;Decreased mobility       PT Treatment Interventions Gait training;Therapeutic activities;Therapeutic exercise    PT Goals (Current goals can be found in the Care Plan section)  Acute Rehab PT Goals Patient Stated Goal: to go home PT Goal Formulation: With patient Time For Goal Achievement: 10/26/19 Potential to Achieve Goals: Good    Frequency Min 3X/week   Barriers to discharge   Information is per pt there is no family in the room.  Pt states that she lives with her 63 yo daughter who is adopted.  Pt will need 24 hr supervision. She is currently being seen by Home health PT to improve her mobilty       AM-PAC PT "6 Clicks" Mobility  Outcome Measure Help needed turning from your back to your side while in a flat bed without using bedrails?: None Help needed moving from lying on  your back to sitting on the side of a flat bed without using bedrails?: None Help needed moving to and from a bed to a chair (including a wheelchair)?: A Little Help needed standing up from a chair using your arms (e.g., wheelchair or bedside chair)?: A Little Help needed to walk in hospital room?: A Little Help needed climbing 3-5 steps with a railing? : A Lot 6 Click Score: 19    End of Session Equipment Utilized During Treatment: Gait belt Activity Tolerance: Patient tolerated treatment well Patient left: in bed;with call bell/phone within reach Nurse Communication: Mobility status PT Visit Diagnosis: Unsteadiness on feet (R26.81);History of falling (Z91.81)    Time: [id number] PT Time Calculation (min) (ACUTE ONLY): 44 min   Charges:   PT Evaluation $PT Eval Low Complexity: Beatty, PT CLT [phone] 10/24/2019, 12:21 PM

## 2019-10-24 NOTE — Progress Notes (Signed)
Initial Nutrition Assessment  DOCUMENTATION CODES:   Severe malnutrition in context of chronic illness  INTERVENTION:  Continue Ensure Enlive po BID, each supplement provides 350 kcal and 20 grams of protein  Magic cup BID with meals, each supplement provides 290 kcal and 9 grams of protein  MVI with minerals daily  Education    NUTRITION DIAGNOSIS:   Severe Malnutrition related to chronic illness(ongoing vomiting/regurgitation with oral intake of varying food consistencies) as evidenced by per patient/family report, energy intake < or equal to 75% for > or equal to 1 month, percent weight loss, moderate fat depletion, severe fat depletion, moderate muscle depletion, severe muscle depletion.    GOAL:   Patient will meet greater than or equal to 90% of their needs    MONITOR:   Labs, I & O's, Supplement acceptance, Weight trends, PO intake  REASON FOR ASSESSMENT:   Malnutrition Screening Tool    ASSESSMENT:  64 year old female with past medical history of DM2, bipolar 1 disorder, TIA, subdural hematoma, HLD, HTN, GERD, chronic headaches, history of frequent falls, recent admission 06/24/19-07/03/19 for right-sided subacute traumatic subdural hematoma secondary to fall and discharged to [facility] for rehab, admission 08/12/2019-08/13/2019 for AKI, early DKA, and dehydration, presented with altered mental status after 2 falls at home. Patient evaluated by PT at home, concerns for left-sided neglect and weakness and brought to hospital for further evaluation.  Per notes, ongoing issues with vomiting/regurgitation with varying consistencies of food, denies specific dysphagia or odynophagia. Daughter of pt reports that pt consistently tolerates ice cream and Ensure supplement and has intermittent pill dysphagia. EGD on 2/01 showed normal esophagus and erythematous mucosa in stomach. Nausea and vomiting improved with Zofran. GI consult pending.  Patient eating lunch at RD visit this  afternoon, reports having some nausea with intake of meal. Patient is a poor historian, limited nutrition history obtained. Patient endorses poor oral intake due to nausea and early satiety for the past few months.She denies chewing/swallowing difficulties, stated I just feel sick and throw up all the time after I eat. Patient reports usually eating 3 meals/day, but unable to recall dietary intake or food preferences.  RD educated on 4-6 smaller meals/day verses 3 large meals to aid with digestion, encouraged patient to limit high fat foods and raw fruits/vegetables that are high in soluble fiber, slowing gastric emptying. Suggesting small bites, chewing food well before swallowing, refraining from laying down after meals and taking a walk after eating if able. Recommended daily nutrition supplements with poor intake. Patient endorsed drinking Ensure supplements at home and tolerating most of the time. She likes chocolate and vanilla flavors. Per medications, she is provided Ensure twice daily. RD will add magic cup supplement to lunch and dinner trays.  UBW 156 lbs a couple of years ago per pt. Current wt 100.76 lbs Per history weights have been trending down since 2019. She has lost 18.04 lbs (15%) in the past 2 months which is severe for time frame. On 07/03/19 pt wt 60.1 kg (132.22 lbs), on 08/09/2019 pt wt 54 kg (118.8 lbs)  Patient meets criteria for severe malnutrition in the context of chronic disease given severe wt loss, inadequate intake <75% of estimated needs > 1 month, and moderate/severe fat and muscle depletions noted on NFPE. Patient  provided education and nutrition supplements to aid with estimated kcal/protein needs. Medications reviewed and include: SS novolog, Zofran, Protonix  Labs: CBGs 122, 93, 90 since admit, BUN 60 (H), Cr 3.25 (H)  NUTRITION -  FOCUSED PHYSICAL EXAM: 2/24 Findings: Moderate fat depletions to orbital, thoracic and lumbar, buccal regions; Severe fat depletion  to upper arm region; Moderate muscle depletion to temple, scapular, dorsal hand regions; Severe muscle depletions to clavicle, clavicle and acromion bone regions.    Diet Order:   Diet Order            Diet heart healthy/carb modified Room service appropriate? Yes; Fluid consistency: Thin  Diet effective now              EDUCATION NEEDS:   Education needs have been addressed  Skin:  Skin Assessment: Reviewed RN Assessment  Last BM:  2/24 type 2  Height:   Ht Readings from Last 1 Encounters:  10/23/19 5\' 1"  (1.549 m)    Weight:   Wt Readings from Last 1 Encounters:  10/23/19 45.8 kg    Ideal Body Weight:  47.7 kg  BMI:  Body mass index is 19.08 kg/m.  Estimated Nutritional Needs:   Kcal:  1500-1700  Protein:  75-85  Fluid:  >/= 1.5 L/day    Lajuan Lines, RD, LDN Clinical Nutrition Office [phone] After Hours/Weekend Pager # in [facility]

## 2019-10-24 NOTE — Progress Notes (Signed)
Bedside report completed, assumed care. Pt presents lying w/ hob 30-40 deg. AAOx2, made aware of time, and situation. Pt denies c/o at this time. Will con't to monitor neuro status.

## 2019-10-24 NOTE — Progress Notes (Addendum)
PROGRESS NOTE  Linda English [id number] DOB: 1955/09/28 DOA: 10/23/2019 PCP: [name], MD  Brief History:  64 year old female with a history of diabetes mellitus type 2, bipolar 1 disorder, TIA, subdural hematoma, hyperlipidemia, hypertension, GERD, chronic headaches presenting with altered mental status.  Apparently, the patient has frequent falls.  She fell and hit the back of her head on 10/22/2019.  The patient fell again in the morning of 10/23/2019 hitting the side of her face.  She went to take a nap in the morning of 10/23/2019 and woke up around 1:30 PM with confusion.  There was concern for left-sided neglect and weakness when she was evaluated by her physical therapist at home.  As result, she was brought to the hospital for further evaluation.  The patient has not had any new medications started.  She continues to have issues with vomiting or regurgitation with varying consistencies of food.  She had an EGD performed on 10/01/2019 which showed normal esophagus and erythematous mucosa in the stomach.  She takes Zofran around-the-clock which does help her nausea and vomiting, but when she runs out her vomiting recurs.  She denies any specific dysphagia or odynophagia.  Interestingly, her daughter states that she is able to keep down Ensure and ice cream reliably.  She has intermittent pill dysphagia.  She has had some intermittent loose stools but there is no hematochezia or melena.  There is no abdominal pain, chest pain, shortness of breath, coughing, hemoptysis.  Patient had a recent admission to the hospital from 06/24/2019 to 07/03/2019 with a right-sided subacute traumatic subdural hematoma secondary to a fall.  She was subsequent sent to [facility] for rehab.  In addition, she had a hospitalization from 08/12/2019 to 08/13/2019 for acute kidney injury, early DKA, dehydration.   In the emergency department, the patient had temperature 100.0 F.  She was hemodynamically stable  with oxygen saturation 100% room air.  BMP showed a serum creatinine 3.26 which is above her usual baseline.  WBC 9.0, hemoglobin 12.3, platelets 214,000.  CT and MRI of the brain without negative for acute findings.  Assessment/Plan: Acute metabolic encephalopathy -Secondary to dehydration and acute kidney injury -Continue IV fluids -Mental status improving, but not back to baseline -Serum B12 -Folate -ammonia -Personally reviewed chest x-ray--no consolidation or edema -Personally reviewed EKG--sinus rhythm, nonspecific T wave change -UDS--+Benzo -MRI brain--atrophy -CT brain--neg  Acute on chronic renal failure--CKD stage IIIa -Baseline creatinine 0.9-1.2 -Presented with serum creatinine 3.26 -Urinalysis quite bland -Renal ultrasound -continue IVF  Intractable nausea and vomiting -GI consult -Start Zofran around-the-clock -Continue IV fluid -Continue Protonix  Essential hypertension -Continue amlodipine and metoprolol tartrate  Bipolar disorder -Continue Lamictal  Diabetes mellitus type 2, controlled -08/12/2019 hemoglobin A1c 5.7 -Holding metformin -novolog sliding scale  Frequent falls -PT evaluation      Disposition Plan: Patient From: Home D/C Place: Home - 2-3  Days Barriers: Not Clinically Stable--remains in AKI  Family Communication:   Daughter updated 2/24  Consultants:  GI  Code Status:  FULL   DVT Prophylaxis:  [place] Heparin    Procedures: As Listed in Progress Note Above  Antibiotics: None  RN Pressure Injury Documentation:        Subjective: Patient denies fevers, chills, headache, chest pain, dyspnea, diarrhea, abdominal pain, dysuria, hematuria, hematochezia, and melena.   Objective: Vitals:   10/23/19 2351 10/24/19 0152 10/24/19 0401 10/24/19 0559  BP: 111/81 121/74 134/74 139/76  Pulse: (!) 110  89 77 81  Resp: 18 18 18 18   Temp: 98.7 F (37.1 C) 98.2 F (36.8 C) 98.2 F (36.8 C) 98.5 F (36.9 C)  TempSrc: Oral  Oral Oral Oral  SpO2: 100% 100% 99% 100%  Weight:      Height:        Intake/Output Summary (Last 24 hours) at 10/24/2019 0843 Last data filed at 10/24/2019 0300 Gross per 24 hour  Intake 396.55 ml  Output --  Net 396.55 ml   Weight change:  Exam:   General:  Pt is alert, follows commands appropriately, not in acute distress  HEENT: No icterus, No thrush, No neck mass, [place]/AT  Cardiovascular: RRR, S1/S2, no rubs, no gallops  Respiratory: diminished CTA bilaterally, no wheezing, no crackles, no rhonchi  Abdomen: Soft/+BS, non tender, non distended, no guarding  Extremities: No edema, No lymphangitis, No petechiae, No rashes, no synovitis   Data Reviewed: I have personally reviewed following labs and imaging studies Basic Metabolic Panel: Recent Labs  Lab 10/23/19 1459 10/23/19 1540 10/23/19 2303 10/24/19 0516  NA 140 141  --  141  K 3.3* 3.2*  --  3.7  CL 98 101  --  102  CO2 26  --   --  26  GLUCOSE 131* 124*  --  108*  BUN 59* 49*  --  60*  CREATININE 3.26* 3.40*  --  3.25*  CALCIUM 9.8  --   --  9.3  MG  --   --  1.8  --    Liver Function Tests: Recent Labs  Lab 10/23/19 1459  AST 20  ALT 12  ALKPHOS 90  BILITOT 0.5  PROT 7.8  ALBUMIN 4.6   No results for input(s): LIPASE, AMYLASE in the last 168 hours. No results for input(s): AMMONIA in the last 168 hours. Coagulation Profile: Recent Labs  Lab 10/23/19 1459  INR 1.0   CBC: Recent Labs  Lab 10/23/19 1459 10/23/19 1540  WBC 9.0  --   NEUTROABS 6.2  --   HGB 12.3 12.9  HCT 38.2 38.0  MCV 85.1  --   PLT 314  --    Cardiac Enzymes: No results for input(s): CKTOTAL, CKMB, CKMBINDEX, TROPONINI in the last 168 hours. BNP: Invalid input(s): POCBNP CBG: Recent Labs  Lab 10/23/19 1435 10/23/19 2131 10/24/19 0738  GLUCAP 116* 90 93   HbA1C: Recent Labs    10/23/19 1459  HGBA1C 5.8*   Urine analysis:    Component Value Date/Time   COLORURINE YELLOW 10/23/2019 1610    APPEARANCEUR HAZY (A) 10/23/2019 1610   LABSPEC 1.016 10/23/2019 1610   PHURINE 5.0 10/23/2019 1610   GLUCOSEU NEGATIVE 10/23/2019 1610   HGBUR SMALL (A) 10/23/2019 1610   BILIRUBINUR NEGATIVE 10/23/2019 1610   KETONESUR NEGATIVE 10/23/2019 1610   PROTEINUR 30 (A) 10/23/2019 1610   UROBILINOGEN 1.0 06/07/2012 2238   NITRITE NEGATIVE 10/23/2019 1610   LEUKOCYTESUR TRACE (A) 10/23/2019 1610   Sepsis Labs: @LABRCNTIP (procalcitonin:4,lacticidven:4) ) Recent Results (from the past 240 hour(s))  Culture, blood (routine x 2)     Status: None (Preliminary result)   Collection Time: 10/23/19 11:02 PM   Specimen: BLOOD  Result Value Ref Range Status   Specimen Description BLOOD RIGHT ANTECUBITAL  Final   Special Requests   Final    BOTTLES DRAWN AEROBIC AND ANAEROBIC Blood Culture adequate volume   Culture   Final    NO GROWTH < 12 HOURS Performed at [facility], [address], [place],  [place] [address]    Report Status PENDING  Incomplete  Culture, blood (routine x 2)     Status: None (Preliminary result)   Collection Time: 10/23/19 11:08 PM   Specimen: BLOOD  Result Value Ref Range Status   Specimen Description BLOOD BLOOD LEFT WRIST  Final   Special Requests   Final    BOTTLES DRAWN AEROBIC AND ANAEROBIC Blood Culture adequate volume   Culture   Final    NO GROWTH < 12 HOURS Performed at [facility], [address], [place], [place] [address]    Report Status PENDING  Incomplete     Scheduled Meds: . amLODipine  5 mg Oral Daily  . feeding supplement (ENSURE ENLIVE)  237 mL Oral BID BM  . heparin  5,000 Units Subcutaneous Q8H  . insulin aspart  0-5 Units Subcutaneous QHS  . insulin aspart  0-9 Units Subcutaneous TID WC  . lamoTRIgine  200 mg Oral BID  . metoprolol tartrate  25 mg Oral BID  . pantoprazole  40 mg Oral BID AC   Continuous Infusions: . 0.9 % NaCl with KCl 20 mEq / L 100 mL/hr at 10/24/19 0008    Procedures/Studies: MR ANGIO HEAD WO CONTRAST  Result  Date: 10/23/2019 CLINICAL DATA:  Focal neuro deficit.  Fall yesterday. EXAM: MRA HEAD WITHOUT CONTRAST TECHNIQUE: Angiographic images of the Circle of Willis were obtained using MRA technique without intravenous contrast. COMPARISON:  CTA head and neck 08/09/2015 FINDINGS: Both vertebral arteries patent to the basilar. PICA patent bilaterally. Basilar widely patent. Posterior cerebral arteries widely patent bilaterally. Internal carotid artery widely patent bilaterally. Hypoplastic left A1 segment. Both anterior cerebral ups are patent and supplied from the right. Middle cerebral arteries patent bilaterally without stenosis or large vessel occlusion Negative for cerebral aneurysm. IMPRESSION: Negative MRA head Electronically Signed   By: Franchot Gallo M.D.   On: 10/23/2019 20:31   MR ANGIO NECK WO CONTRAST  Result Date: 10/23/2019 CLINICAL DATA:  Neuro deficit EXAM: MRA NECK WITHOUT CONTRAST TECHNIQUE: Angiographic images of the neck were obtained using MRA technique without intravenous contrast. Carotid stenosis measurements (when applicable) are obtained utilizing NASCET criteria, using the distal internal carotid diameter as the denominator. COMPARISON:  CT a head and neck 08/09/2015. FINDINGS: MRA NECK FINDINGS Both vertebral arteries are patent with antegrade flow. Moderate stenosis proximal left vertebral artery. Left vertebral artery was widely patent on prior CTA raising the possibility of artifact on today's study. Right vertebral artery widely patent Carotid bifurcation widely patent bilaterally without stenosis or irregularity IMPRESSION: Both carotid arteries widely patent. Moderate stenosis proximal left vertebral artery. Electronically Signed   By: Franchot Gallo M.D.   On: 10/23/2019 20:33   MR BRAIN WO CONTRAST  Result Date: 10/23/2019 CLINICAL DATA:  Focal neuro deficit.  Fall yesterday EXAM: MRI HEAD WITHOUT CONTRAST TECHNIQUE: Multiplanar, multiecho pulse sequences of the brain and  surrounding structures were obtained without intravenous contrast. COMPARISON:  CT head today. FINDINGS: Brain: Negative for acute infarct. Mild atrophy without hydrocephalus. Negative for hemorrhage mass or fluid collection. Vascular: Normal arterial flow voids. Skull and upper cervical spine: No focal skeletal lesion. Sinuses/Orbits: Negative Other: None IMPRESSION: Generalized atrophy without acute abnormality. Electronically Signed   By: Franchot Gallo M.D.   On: 10/23/2019 20:24   DG CHEST PORT 1 VIEW  Result Date: 10/23/2019 CLINICAL DATA:  Altered mental status and fever, history of diabetes and hypertension EXAM: PORTABLE CHEST 1 VIEW COMPARISON:  Radiograph 10/06/2016 FINDINGS: No consolidation, features  of edema, pneumothorax, or effusion. The cardiomediastinal contours are unremarkable. No acute osseous or soft tissue abnormality. Redemonstrated dextrocurvature of the thoracic spine. Prior cervical spinal fusion hardware is again noted. Degenerative changes present in the shoulders and spine. Cholecystectomy clips in the right upper quadrant. Telemetry leads overlie the chest. IMPRESSION: No acute cardiopulmonary abnormality. Electronically Signed   By: Lovena Le M.D.   On: 10/23/2019 22:55   CT HEAD CODE STROKE WO CONTRAST  Result Date: 10/23/2019 CLINICAL DATA:  Code stroke.  Left-sided weakness EXAM: CT HEAD WITHOUT CONTRAST TECHNIQUE: Contiguous axial images were obtained from the base of the skull through the vertex without intravenous contrast. COMPARISON:  08/12/2019 FINDINGS: Brain: There is no acute intracranial hemorrhage, mass effect, or edema. Gray-white differentiation remains preserved. Minimal patchy hypoattenuation in the supratentorial white matter is nonspecific but may reflect minor chronic microvascular ischemic changes. Ventricles and sulci are stable in size and configuration. Vascular: No hyperdense vessel. There is intracranial atherosclerotic calcification at the skull  base. Skull: Unremarkable. Sinuses/Orbits: The imaged paranasal sinuses are clear. Visualized orbits are unremarkable. Other: Mastoid air cells are clear. ASPECTS ([place] Stroke Program Early CT Score) - Ganglionic level infarction (caudate, lentiform nuclei, internal capsule, insula, M1-M3 cortex): 7 - Supraganglionic infarction (M4-M6 cortex): 3 Total score (0-10 with 10 being normal): 10 IMPRESSION: No acute intracranial hemorrhage or evidence of acute infarction. ASPECT score is 10. These results were called by telephone at the time of interpretation on 10/23/2019 at 3:03 pm to provider [facility] , who verbally acknowledged these results. Electronically Signed   By: Macy Mis M.D.   On: 10/23/2019 15:07    Orson Eva, DO  [facility]  If 7PM-7AM, please contact night-coverage [email or web address] Password [facility] 10/24/2019, 8:43 AM   LOS: 0 days

## 2019-10-25 ENCOUNTER — Encounter (HOSPITAL_COMMUNITY): Payer: Self-pay | Admitting: Internal Medicine

## 2019-10-25 ENCOUNTER — Other Ambulatory Visit (HOSPITAL_COMMUNITY): Payer: Self-pay | Admitting: *Deleted

## 2019-10-25 ENCOUNTER — Inpatient Hospital Stay (HOSPITAL_COMMUNITY): Payer: Medicare Other

## 2019-10-25 DIAGNOSIS — I351 Nonrheumatic aortic (valve) insufficiency: Secondary | ICD-10-CM

## 2019-10-25 DIAGNOSIS — R112 Nausea with vomiting, unspecified: Secondary | ICD-10-CM

## 2019-10-25 DIAGNOSIS — E43 Unspecified severe protein-calorie malnutrition: Secondary | ICD-10-CM

## 2019-10-25 DIAGNOSIS — I340 Nonrheumatic mitral (valve) insufficiency: Secondary | ICD-10-CM

## 2019-10-25 DIAGNOSIS — R634 Abnormal weight loss: Secondary | ICD-10-CM

## 2019-10-25 LAB — GLUCOSE, CAPILLARY
Glucose-Capillary: 113 mg/dL — ABNORMAL HIGH (ref 70–99)
Glucose-Capillary: 183 mg/dL — ABNORMAL HIGH (ref 70–99)
Glucose-Capillary: 118 mg/dL — ABNORMAL HIGH (ref 70–99)
Glucose-Capillary: 235 mg/dL — ABNORMAL HIGH (ref 70–99)

## 2019-10-25 LAB — ECHOCARDIOGRAM COMPLETE
Height: 61 in
Weight: 1616 oz

## 2019-10-25 LAB — BASIC METABOLIC PANEL
Anion gap: 15 (ref 5–15)
BUN: 21 mg/dL (ref 8–23)
CO2: 21 mmol/L — ABNORMAL LOW (ref 22–32)
Calcium: 8.8 mg/dL — ABNORMAL LOW (ref 8.9–10.3)
Chloride: 97 mmol/L — ABNORMAL LOW (ref 98–111)
Creatinine, Ser: 1.23 mg/dL — ABNORMAL HIGH (ref 0.44–1.00)
GFR calc Af Amer: 54 mL/min — ABNORMAL LOW (ref >60)
GFR calc non Af Amer: 47 mL/min — ABNORMAL LOW (ref >60)
Glucose, Bld: 130 mg/dL — ABNORMAL HIGH (ref 70–99)
Potassium: 3.5 mmol/L (ref 3.5–5.1)
Sodium: 133 mmol/L — ABNORMAL LOW (ref 135–145)

## 2019-10-25 LAB — CBC
HCT: 36.5 % (ref 36.0–46.0)
Hemoglobin: 11.8 g/dL — ABNORMAL LOW (ref 12.0–15.0)
MCH: 27 pg (ref 26.0–34.0)
MCHC: 32.3 g/dL (ref 30.0–36.0)
MCV: 83.5 fL (ref 80.0–100.0)
Platelets: 297 10*3/uL (ref 150–400)
RBC: 4.37 MIL/uL (ref 3.87–5.11)
RDW: 13.5 % (ref 11.5–15.5)
WBC: 6.6 10*3/uL (ref 4.0–10.5)
nRBC: 0 % (ref 0.0–0.2)

## 2019-10-25 LAB — MAGNESIUM: Magnesium: 1.1 mg/dL — ABNORMAL LOW (ref 1.7–2.4)

## 2019-10-25 MED ORDER — METOPROLOL TARTRATE 5 MG/5ML IV SOLN
5.0000 mg | Freq: Once | INTRAVENOUS | Status: AC
  Administered 2019-10-25: 5 mg via INTRAVENOUS
  Filled 2019-10-25: qty 5

## 2019-10-25 MED ORDER — MAGNESIUM SULFATE 2 GM/50ML IV SOLN
2.0000 g | Freq: Once | INTRAVENOUS | Status: AC
  Administered 2019-10-25: 2 g via INTRAVENOUS
  Filled 2019-10-25: qty 50

## 2019-10-25 MED ORDER — DILTIAZEM HCL 30 MG PO TABS
30.0000 mg | ORAL_TABLET | Freq: Four times a day (QID) | ORAL | Status: DC
  Administered 2019-10-25 – 2019-10-26 (×4): 30 mg via ORAL
  Filled 2019-10-25 (×4): qty 1

## 2019-10-25 NOTE — TOC Initial Note (Signed)
Transition of Care [facility]) - Initial/Assessment Note    Patient Details  Name: Linda English MRN: [id number] Date of Birth: 1956-08-11  Transition of Care [facility]) CM/SW Contact:    Ihor Gully, LCSW Phone Number: 10/25/2019, 4:19 PM  Clinical Narrative:                 Patient from home with 64 year old daughter who is home most of the day unless she has to work which leaves patient home alone for a "couple hours". Adult daughter, Denita Lung, discussed that patient is active with [facility] (RN/PT). She comes to check on patient several days per week. Montrese discussed that patient's neurologist advised that patient would qualify for increased in home aid. Discussed PCA services are all private pay because patient did not have Medicaid. Montrese stated that she would follow up with patient's neurologist as to the increased care he was referencing.   Expected Discharge Plan: [place] Barriers to Discharge: Continued Medical Work up   Patient Goals and CMS Choice Patient states their goals for this hospitalization and ongoing recovery are:: Return home      Expected Discharge Plan and Services Expected Discharge Plan: [place]                                              Prior Living Arrangements/Services     Patient language and need for interpreter reviewed:: Yes Do you feel safe going back to the place where you live?: Yes      Need for Family Participation in Patient Care: Yes (Comment) Care giver support system in place?: Yes (comment) Current home services: DME, Home PT, Home RN Criminal Activity/Legal Involvement Pertinent to Current Situation/Hospitalization: No - Comment as needed  Activities of Daily Living Home Assistive Devices/Equipment: Wheelchair, Environmental consultant (specify type), Shower chair with back, Bedside commode/3-in-1 ADL Screening (condition at time of admission) Patient's cognitive ability adequate to  safely complete daily activities?: Yes Is the patient deaf or have difficulty hearing?: No Does the patient have difficulty seeing, even when wearing glasses/contacts?: No Does the patient have difficulty concentrating, remembering, or making decisions?: Yes Patient able to express need for assistance with ADLs?: Yes(Pt stated no) Does the patient have difficulty dressing or bathing?: Yes(pt states daughter assists her) Independently performs ADLs?: No Communication: Independent Dressing (OT): Needs assistance Is this a change from baseline?: Pre-admission baseline Grooming: Needs assistance Is this a change from baseline?: Pre-admission baseline Feeding: Independent Bathing: Needs assistance Is this a change from baseline?: Pre-admission baseline Toileting: Independent In/Out Bed: Needs assistance Is this a change from baseline?: Pre-admission baseline Walks in Home: (uses walker and wheelchair at home) Does the patient have difficulty walking or climbing stairs?: Yes Weakness of Legs: Both Weakness of Arms/Hands: Both  Permission Sought/Granted                  Emotional Assessment     Affect (typically observed): Unable to Assess Orientation: : Oriented to Self, Oriented to Place, Oriented to Situation Alcohol / Substance Use: Not Applicable Psych Involvement: No (comment)  Admission diagnosis:  Left-sided weakness [R53.1] Acute renal failure, unspecified acute renal failure type ([place]) [N17.9] Cerebrovascular accident (CVA), unspecified mechanism ([place]) [I63.9] Acute renal failure superimposed on stage 3 chronic kidney disease, unspecified acute renal failure type, unspecified whether stage 3a or 3b  CKD ([place]) [N17.9, N18.30] Patient Active Problem List   Diagnosis Date Noted  . Protein-calorie malnutrition, severe 10/25/2019  . Acute renal failure superimposed on stage 3a chronic kidney disease ([place]) 10/24/2019  . Intractable vomiting with nausea 10/24/2019  . Acute  renal failure superimposed on stage 3 chronic kidney disease ([place]) 10/24/2019  . Acute metabolic encephalopathy [id number]  . Vertigo as late effect of stroke 08/13/2019  . Dizziness 08/13/2019  . DKA (diabetic ketoacidoses) ([place]) 08/12/2019  . Rt Sided Subdural hematoma, post-traumatic  07/02/2019  . AKI (acute kidney injury) ([place]) 06/26/2019  . ARF (acute renal failure) ([place]) 06/24/2019  . Nausea & vomiting 06/05/2019  . Diarrhea 07/27/2017  . Transient ischemic attack 08/09/2015  . Reflux esophagitis   . Gastric polyp   . Dyspepsia 06/13/2015  . Dysphagia 06/13/2015  . Incisional irritation 06/18/2013  . Wound drainage 06/18/2013  . Bipolar 1 disorder ([place]) 06/08/2012    Class: Chronic  . Anorexia 05/23/2012  . Insomnia 05/23/2012  . Weight loss 05/23/2012  . Abnormal weight loss 04/10/2012  . Esophageal dysphagia 04/10/2012  . Nausea 09/08/2011  . GERD (gastroesophageal reflux disease) 07/14/2011  . Epigastric pain 07/14/2011  . Constipation 07/14/2011  . Knee pain 02/09/2011  . OA (osteoarthritis) of knee 02/09/2011  . Family hx of colon cancer 11/30/2010  . Bowel habit changes 11/30/2010  . OSTEOARTHRITIS, KNEE 08/05/2010  . PES PLANUS 08/05/2010  . RECTAL CANCER 10/23/2007  . Diabetes ([place]) 10/23/2007  . HYPERLIPIDEMIA 10/23/2007  . Anxiety state 10/23/2007  . PANIC DISORDER 10/23/2007  . DEPRESSION 10/23/2007  . OBSTRUCTIVE SLEEP APNEA 10/23/2007  . Essential hypertension 10/23/2007  . ALLERGIC RHINITIS 10/23/2007  . IBS 10/23/2007  . HEADACHE, CHRONIC 10/23/2007   PCP:  [name], MD Pharmacy:   [facility] [address] [place] [address] Phone: [phone] Fax: [phone]  [facility] [address] [place] [address] [place] [address] Phone: [phone] Fax: [facility] [address] [place] [address] [place] [address] [place] [address] Phone: [phone] Fax: [phone]     Social Determinants of Health (SDOH) Interventions    Readmission Risk Interventions Readmission Risk Prevention Plan 07/03/2019  Transportation Screening Complete  PCP or Specialist Appt within 5-7 Days Not Complete  Not Complete comments SNF MD will follow  Home Care Screening Not Complete  Home Care Screening Not Completed Comments Pt going to SNF  Medication Review (RN CM) Complete  Some recent data might be hidden

## 2019-10-25 NOTE — Progress Notes (Signed)
*  PRELIMINARY RESULTS* Echocardiogram 2D Echocardiogram has been performed.  Samuel Germany 10/25/2019, 3:03 PM

## 2019-10-25 NOTE — Progress Notes (Signed)
Physical Therapy Treatment Patient Details Name: Linda English MRN: [id number] DOB: 13-Nov-1955 Today's Date: 10/25/2019    History of Present Illness Linda English is a 64 y.o. female with medical history significant for diabetes mellitus, Parkinson's disease, subdural hematoma, hypertension, bipolar disorder.History is mostly obtained from chart review, and the time of my evaluation patient is awake and alert but she gives mostly yes answers to all questions.  Patient was woken up at about 1:30 PM for her physical therapy.  Per her physical therapist patient seemed weaker, with left side weakness and neglect.  Daughter also felt patient's was less alert than normal and had some confusion.  Reported poor p.o. intake in the past few weeks. Family also reported that patient's fell yesterday and hit the back of her head but seemed okay yesterday.  He also reported frequent regurgitation, for which she has had an EGD to evaluate and was unremarkable    PT Comments    Pt supine in bed and willing to participate.  Pt independent with bed mobility and STS, cueing for safety with sit to stands with cueing for hand placement.  Able to ambulate with RW 58ft with min guard, no LOB episodes though pt does demonstrate fall risk with shuffled gait and slow cadence.  EOS pt left in chair with chair alarm set, call bell within reach and NT in room at EOS.  Pt was limited by fatigue, no reports of pain.    Follow Up Recommendations  Home health PT;Supervision/Assistance - 24 hour     Equipment Recommendations  None recommended by PT    Recommendations for Other Services       Precautions / Restrictions Precautions Precautions: Fall Restrictions Weight Bearing Restrictions: No    Mobility  Bed Mobility Overal bed mobility: Independent                Transfers Overall transfer level: Independent Equipment used: Rolling walker (2 wheeled)             General transfer  comment: cueing for hand placement  Ambulation/Gait Ambulation/Gait assistance: Min guard Gait Distance (Feet): 30 Feet Assistive device: Rolling walker (2 wheeled) Gait Pattern/deviations: Decreased step length - right;Decreased step length - left Gait velocity: slow   General Gait Details: shuffled gait, slow cadence   Stairs             Wheelchair Mobility    Modified Rankin (Stroke Patients Only)       Balance                                            Cognition Arousal/Alertness: Awake/alert Behavior During Therapy: WFL for tasks assessed/performed Overall Cognitive Status: Within Functional Limits for tasks assessed                                        Exercises      General Comments        Pertinent Vitals/Pain Pain Assessment: No/denies pain    Home Living                      Prior Function            PT Goals (current goals can now be found in the care plan section)  Frequency    Min 3X/week      PT Plan Current plan remains appropriate    Co-evaluation              AM-PAC PT "6 Clicks" Mobility   Outcome Measure  Help needed turning from your back to your side while in a flat bed without using bedrails?: None Help needed moving from lying on your back to sitting on the side of a flat bed without using bedrails?: None Help needed moving to and from a bed to a chair (including a wheelchair)?: A Little Help needed standing up from a chair using your arms (e.g., wheelchair or bedside chair)?: A Little Help needed to walk in hospital room?: A Little Help needed climbing 3-5 steps with a railing? : A Lot 6 Click Score: 19    End of Session Equipment Utilized During Treatment: Gait belt Activity Tolerance: Patient tolerated treatment well;Patient limited by fatigue Patient left: in chair;with call bell/phone within reach;with chair alarm set;with nursing/sitter in room Nurse  Communication: Mobility status PT Visit Diagnosis: Unsteadiness on feet (R26.81);History of falling (Z91.81)     Time: [id number] PT Time Calculation (min) (ACUTE ONLY): 25 min  Charges:  $Therapeutic Activity: 23-37 mins                     Ihor Austin, LPTA/CLT; CBIS [phone]   Aldona Lento 10/25/2019, 5:54 PM

## 2019-10-25 NOTE — Consult Note (Addendum)
Referring Provider: Orson Eva, MD Primary Care Physician:  [name], MD Primary Gastroenterologist:  Garfield Cornea, MD  Reason for Consultation: Intractable vomiting  HPI: Linda English is a 64 y.o. female with past medical history of diabetes, Parkinson's, hypertension, bipolar disorder, subdural hematoma November 2020 presenting to the emergency department via EMS for mental status change and questionable left-sided weakness.  Findings noted upon awakening patient for physical therapy at home.  Family reported that patient had fallen the day before and had hit the back of her head but it seemed okay up until this point.  Given findings it was recommended that she come to the emergency department for evaluation. Patient noted to have vomiting on arrival.  Code stroke activated activated.  CT head and MRI brain without acute findings.   We were consulted for intractable vomiting.  Patient recently evaluated for the same back in January.  Patient has a history of alternating constipation and diarrhea, rectal cancer s/p resection in 2003. Colonoscopy May 2016 with normal residual rectum and colon. Surgical anastomosis at approximately 5 cm from anal verge. Repeat colonoscopy due in 2021.  EGD on October 01, 2019 showed normal esophagus status post esophageal dilation for dysphagia, erythematous mucosa in the stomach consistent with reactive gastropathy on biopsy, no H. pylori, multiple fundic gland polyps based on biopsy.  Patient weight 165 pounds back in August 2019.  Weight 118 pounds December 2020.  Currently weighs 101 pounds.  Chest x-ray yesterday unremarkable.  CT abdomen pelvis without contrast October 2020 showed postsurgical changes consistent with lower anterior colonic resection, status post appendectomy, no other significant findings.  Patient complains of intermittent nausea and vomiting has been going on for quite some time.  Complains of some epigastric discomfort sometimes  worse with meals.  Denies dysphagia.  No heartburn.  Intermittent diarrhea.  No melena or rectal bleeding.  Her history descriptions are quite vague, she admits to some memory issues.  She is alert and oriented to person, place, time.  Medication list includes Linzess, pantoprazole.  She has recently been added on erythromycin and Zofran for empirical treatment of suspected gastroparesis.   Prior to Admission medications   Medication Sig Start Date End Date Taking? Authorizing Provider  ALPRAZolam Duanne Moron) 0.5 MG tablet Take 1 tablet (0.5 mg total) by mouth 3 (three) times daily as needed for anxiety or sleep. 07/03/19  Yes Emokpae, Courage, MD  amLODipine (NORVASC) 5 MG tablet Take 1 tablet (5 mg total) by mouth daily. 07/04/19  Yes Emokpae, Courage, MD  chlorthalidone (HYGROTON) 25 MG tablet Take 25 mg by mouth every morning. 09/17/19  Yes [provider]  erythromycin (E-MYCIN) 250 MG tablet Take 250 mg by mouth 4 (four) times daily.   Yes [provider]  insulin aspart (NOVOLOG) 100 UNIT/ML injection insulin aspart (novoLOG) injection 0-9 Units 0-9 Units, Subcutaneous, 3 times daily with meals, CBG < 70: Implement Hypoglycemia Standing Orders and refer to Hypoglycemia Standing Orders sidebar report  CBG 70 - 120: 0 units CBG 121 - 150: 1 unit CBG 151 - 200: 2 units CBG 201 - 250: 3 units CBG 251 - 300: 5 units CBG 301 - 350: 7 units CBG 351 - 400: 9 units CBG > 400: 07/03/19 07/02/20 Yes Emokpae, Courage, MD  lamoTRIgine (LAMICTAL) 200 MG tablet Take 200 mg by mouth 2 (two) times daily.  06/11/13  Yes [provider]  linaclotide (LINZESS) 72 MCG capsule Take 1 capsule (72 mcg total) by mouth daily before breakfast.  Patient taking differently: Take 72 mcg by mouth daily as needed (for constipation).  09/04/19  Yes Annitta Needs, NP  metFORMIN (GLUCOPHAGE-XR) 500 MG 24 hr tablet Take 500 mg by mouth 2 (two) times daily. 06/13/12  Yes Darrol Jump, MD  metoprolol tartrate  (LOPRESSOR) 25 MG tablet Take 1 tablet (25 mg total) by mouth 2 (two) times daily. 07/03/19  Yes Emokpae, Courage, MD  midodrine (PROAMATINE) 10 MG tablet Take 10 mg by mouth 3 (three) times daily. 09/29/19  Yes [provider]  ondansetron (ZOFRAN) 4 MG tablet Take 1 tablet (4 mg total) by mouth 3 (three) times daily before meals. 09/25/19  Yes Carlis Stable, NP  pantoprazole (PROTONIX) 40 MG tablet Take 40 mg by mouth 2 (two) times daily before a meal.   Yes [provider]  Powders (VAGISIL DEODORANT) POWD Apply 1 application topically daily as needed (for skin irritation).   Yes [provider]  ondansetron (ZOFRAN) 4 MG tablet Take 1 tablet (4 mg total) by mouth every 8 (eight) hours as needed for nausea or vomiting. Patient not taking: Reported on 10/23/2019 09/25/19   Carlis Stable, NP    Current Facility-Administered Medications  Medication Dose Route Frequency Provider Last Rate Last Admin   0.9 % NaCl with KCl 20 mEq/ L  infusion   Intravenous Continuous Tat, Shanon Brow, MD 75 mL/hr at 10/25/19 0043 New Bag at 10/25/19 0043   acetaminophen (TYLENOL) tablet 650 mg  650 mg Oral Q6H PRN Tat, Shanon Brow, MD       amLODipine (NORVASC) tablet 5 mg  5 mg Oral Daily Emokpae, Ejiroghene E, MD   5 mg at 10/24/19 1109   feeding supplement (ENSURE ENLIVE) (ENSURE ENLIVE) liquid 237 mL  237 mL Oral BID BM Emokpae, Ejiroghene E, MD   237 mL at 10/24/19 1116   heparin injection 5,000 Units  5,000 Units Subcutaneous Q8H Emokpae, Ejiroghene E, MD   5,000 Units at 10/25/19 [id number]   insulin aspart (novoLOG) injection 0-5 Units  0-5 Units Subcutaneous QHS Emokpae, Ejiroghene E, MD       insulin aspart (novoLOG) injection 0-9 Units  0-9 Units Subcutaneous TID WC Emokpae, Ejiroghene E, MD   Stopped at 10/24/19 1238   lamoTRIgine (LAMICTAL) tablet 200 mg  200 mg Oral BID Emokpae, Ejiroghene E, MD   200 mg at 10/24/19 2215   metoprolol tartrate (LOPRESSOR) tablet 25 mg  25 mg Oral BID Emokpae,  Ejiroghene E, MD   25 mg at 10/24/19 2215   multivitamin with minerals tablet 1 tablet  1 tablet Oral Daily Tat, David, MD       ondansetron [facility]) tablet 4 mg  4 mg Oral Q6H PRN Emokpae, Ejiroghene E, MD       Or   ondansetron (ZOFRAN) injection 4 mg  4 mg Intravenous Q6H PRN Emokpae, Ejiroghene E, MD       ondansetron (ZOFRAN) injection 4 mg  4 mg Intravenous TID AC Tat, Shanon Brow, MD   4 mg at 10/24/19 1649   pantoprazole (PROTONIX) EC tablet 40 mg  40 mg Oral BID AC Emokpae, Ejiroghene E, MD   40 mg at 10/24/19 1649   polyethylene glycol (MIRALAX / GLYCOLAX) packet 17 g  17 g Oral Daily PRN Emokpae, Ejiroghene E, MD        Allergies as of 10/23/2019 - Review Complete 10/23/2019  Allergen Reaction Noted   Shellfish allergy Anaphylaxis 06/24/2019   Lithium Nausea And Vomiting 06/27/2015   Excedrin  extra strength [aspirin-acetaminophen-caffeine] Other (See Comments) 10/28/2014   Klonopin [clonazepam] Other (See Comments) 10/23/2019    Past Medical History:  Diagnosis Date   Acid reflux    Allergic rhinitis    Anxiety    Arthritis    osteoarthritis   Bipolar disorder (HCC)    Chronic headache    Depression    Diverticula of colon    DM (diabetes mellitus) (HCC)    Gastric erosions    Gastric polyps    benign    Hemorrhoids    Hiatal hernia    Hypertension    IBS (irritable bowel syndrome)    Lichen planus    Obstructive sleep apnea    Panic disorder    Parkinson's disease ([place])    Rectal cancer ([place]) 2003   ileostomy and reversal   Subdural hematoma ([place])    Tubular adenoma     Past Surgical History:  Procedure Laterality Date   ABDOMINAL HYSTERECTOMY     APPENDECTOMY     BACK SURGERY     BIOPSY  10/01/2019   Procedure: BIOPSY;  Surgeon: Daneil Dolin, MD;  Location: [facility];  Service: Endoscopy;;  gastric   CESAREAN SECTION     X  2   CHOLECYSTECTOMY     COLONOSCOPY  08/2007   DR Gala Romney, friable anal canal hemorrhoids, surgical anastomosis at 3cm, distal  scattered tics   COLONOSCOPY  12/15/2010   anal papilla and internal hemorrhoids/diminutive polyp in the base of the cecum. Past, tubular adenoma.. Next colonoscopy due in April 2017.   COLONOSCOPY  10/27/2005   RMR: Anal canal hemorrhoids. Surgical anastomosis at 3 cm from the anal verge appeared normal. Few scattered distal diverticula. The residual  colonic mucosa appeared normal. I suspect the patient bled from hemorrhoids   COLONOSCOPY N/A 01/15/2015   [id number] residual rectum and colon. next tcs 12/2019   ESOPHAGEAL DILATION N/A 06/27/2015   Procedure: ESOPHAGEAL DILATION;  Surgeon: Daneil Dolin, MD;  Location: [facility];  Service: Endoscopy;  Laterality: N/A;   ESOPHAGOGASTRODUODENOSCOPY  05/2002   DR Gala Romney, normal   ESOPHAGOGASTRODUODENOSCOPY  08/03/2011   RMR: small HH/ gastric polyps   ESOPHAGOGASTRODUODENOSCOPY N/A 06/27/2015   Dr. Gala Romney: Mild erosive reflux esophagitits. Status post passage of a Maloney dilator. Hiatal hernia. Gastric polyps and abnormal gastric mucosa of doubtful clinical significane. status post biopsy, benign fundic gland polyp, negative H.pylori   ESOPHAGOGASTRODUODENOSCOPY (EGD) WITH PROPOFOL N/A 10/01/2019   Riniyah Speich: Normal esophagus status post esophageal dilation for dysphagia, erythematous mucosa in the stomach biopsy consistent with reactive gastropathy, no H. pylori, multiple gastric polyps (biopsy fundic gland)   low anterior rection  2003   RECTAL CANCER   MALONEY DILATION N/A 10/01/2019   Procedure: MALONEY DILATION;  Surgeon: Daneil Dolin, MD;  Location: [facility];  Service: Endoscopy;  Laterality: N/A;   SPINE SURGERY  2001   L5,S1 HEMILAMINOTOMY AND DISCECTOMY/DR DEATON    Family History  Problem Relation Age of Onset   Colon cancer Paternal Grandfather 66   Colon polyps Brother    Colon polyps Cousin     Social History   Socioeconomic History   Marital status: Divorced    Spouse name: Not on file   Number of children: Not on  file   Years of education: Not on file   Highest education level: Not on file  Occupational History   Occupation: Product manager: RETIRED    Comment: [employer]  Tobacco Use   Smoking status: Never Smoker   Smokeless tobacco: Never Used   Tobacco comment: Never smoked  Substance and Sexual Activity   Alcohol use: No   Drug use: No   Sexual activity: Never  Other Topics Concern   Not on file  Social History Narrative   Not on file   Social Determinants of Health   Financial Resource Strain:    Difficulty of Paying Living Expenses: Not on file  Food Insecurity:    Worried About [place] in the Last Year: Not on file   Ran Out of Food in the Last Year: Not on file  Transportation Needs:    Lack of Transportation (Medical): Not on file   Lack of Transportation (Non-Medical): Not on file  Physical Activity:    Days of Exercise per Week: Not on file   Minutes of Exercise per Session: Not on file  Stress:    Feeling of Stress : Not on file  Social Connections: Unknown   Frequency of Communication with Friends and Family: Not on file   Frequency of Social Gatherings with Friends and Family: Once a week   Attends Religious Services: More than 4 times per year   Active Member of [employer] or Organizations: No   Attends Archivist Meetings: Never   Marital Status: Divorced  Human resources officer Violence:    Fear of Current or Ex-Partner: Not on file   Emotionally Abused: Not on file   Physically Abused: Not on file   Sexually Abused: Not on file     ROS:  General: Negative for  fever, chills, fatigue. positive anorexia, weight loss, weakness. Eyes: Negative for vision changes.  ENT: Negative for hoarseness, difficulty swallowing , nasal congestion. CV: Negative for chest pain, angina, palpitations, dyspnea on exertion, peripheral edema.  Respiratory: Negative for dyspnea at rest, dyspnea on exertion, cough, sputum, wheezing.  GI: See history of present  illness. GU:  Negative for dysuria, hematuria, urinary incontinence, urinary frequency, nocturnal urination.  MS: Negative for joint pain, low back pain.  Derm: Negative for rash or itching.  Neuro: Negative for weakness, abnormal sensation, seizure, frequent headaches, positive memory loss, no confusion.  Psych: Negative for anxiety, depression, suicidal ideation, hallucinations.  Endo: See HPI Heme: Negative for bruising or bleeding. Allergy: Negative for rash or hives.       Physical Examination: Vital signs in last 24 hours: Temp:  [98.7 F (37.1 C)-99.2 F (37.3 C)] 98.7 F (37.1 C) (02/25 0510) Pulse Rate:  [82-163] 93 (02/25 0755) Resp:  [16] 16 (02/25 0510) BP: (128-151)/(78-95) 151/92 (02/25 0510) SpO2:  [100 %] 100 % (02/25 0510) Last BM Date: 10/24/19  General: Thin cachectic appearing female in no acute distress.  Head: Normocephalic, atraumatic.   Eyes: Conjunctiva pink, no icterus. Mouth: Oropharyngeal mucosa moist and pink , no lesions erythema or exudate. Neck: Supple without thyromegaly, masses, or lymphadenopathy.  Lungs: Clear to auscultation bilaterally.  Heart: Regular rate and rhythm, no murmurs rubs or gallops.  Abdomen: Bowel sounds are normal, mild epigastric tenderness, nondistended, no hepatosplenomegaly or masses, no abdominal bruits or    hernia , no rebound or guarding.   Rectal: Not performed Extremities: No lower extremity edema, clubbing, deformity.  Neuro: Alert and oriented x 4 , grossly normal neurologically.  Skin: Warm and dry, no rash or jaundice.   Psych: Alert and cooperative, normal mood and affect.        Intake/Output from previous day: 02/24 0701 -  02/25 0700 In: 1650.2 [P.O.:360; I.V.:1290.2] Out: -  Intake/Output this shift: Total I/O In: -  Out: 600 [Urine:600]  Lab Results: CBC Recent Labs    10/23/19 1459 10/23/19 1540  WBC 9.0  --   HGB 12.3 12.9  HCT 38.2 38.0  MCV 85.1  --   PLT 314  --    BMET Recent Labs     10/23/19 1459 10/23/19 1540 10/24/19 0516  NA 140 141 141  K 3.3* 3.2* 3.7  CL 98 101 102  CO2 26  --  26  GLUCOSE 131* 124* 108*  BUN 59* 49* 60*  CREATININE 3.26* 3.40* 3.25*  CALCIUM 9.8  --  9.3   LFT Recent Labs    10/23/19 1459  BILITOT 0.5  ALKPHOS 90  AST 20  ALT 12  PROT 7.8  ALBUMIN 4.6    Lipase No results for input(s): LIPASE in the last 72 hours.  PT/INR Recent Labs    10/23/19 1459  LABPROT 13.3  INR 1.0   Lab Results  Component Value Date   HGBA1C 5.8 (H) 10/23/2019      Imaging Studies: MR ANGIO HEAD WO CONTRAST  Result Date: 10/23/2019 CLINICAL DATA:  Focal neuro deficit.  Fall yesterday. EXAM: MRA HEAD WITHOUT CONTRAST TECHNIQUE: Angiographic images of the Circle of Willis were obtained using MRA technique without intravenous contrast. COMPARISON:  CTA head and neck 08/09/2015 FINDINGS: Both vertebral arteries patent to the basilar. PICA patent bilaterally. Basilar widely patent. Posterior cerebral arteries widely patent bilaterally. Internal carotid artery widely patent bilaterally. Hypoplastic left A1 segment. Both anterior cerebral ups are patent and supplied from the right. Middle cerebral arteries patent bilaterally without stenosis or large vessel occlusion Negative for cerebral aneurysm. IMPRESSION: Negative MRA head Electronically Signed   By: Franchot Gallo M.D.   On: 10/23/2019 20:31   MR ANGIO NECK WO CONTRAST  Result Date: 10/23/2019 CLINICAL DATA:  Neuro deficit EXAM: MRA NECK WITHOUT CONTRAST TECHNIQUE: Angiographic images of the neck were obtained using MRA technique without intravenous contrast. Carotid stenosis measurements (when applicable) are obtained utilizing NASCET criteria, using the distal internal carotid diameter as the denominator. COMPARISON:  CT a head and neck 08/09/2015. FINDINGS: MRA NECK FINDINGS Both vertebral arteries are patent with antegrade flow. Moderate stenosis proximal left vertebral artery. Left  vertebral artery was widely patent on prior CTA raising the possibility of artifact on today's study. Right vertebral artery widely patent Carotid bifurcation widely patent bilaterally without stenosis or irregularity IMPRESSION: Both carotid arteries widely patent. Moderate stenosis proximal left vertebral artery. Electronically Signed   By: Franchot Gallo M.D.   On: 10/23/2019 20:33   MR BRAIN WO CONTRAST  Result Date: 10/23/2019 CLINICAL DATA:  Focal neuro deficit.  Fall yesterday EXAM: MRI HEAD WITHOUT CONTRAST TECHNIQUE: Multiplanar, multiecho pulse sequences of the brain and surrounding structures were obtained without intravenous contrast. COMPARISON:  CT head today. FINDINGS: Brain: Negative for acute infarct. Mild atrophy without hydrocephalus. Negative for hemorrhage mass or fluid collection. Vascular: Normal arterial flow voids. Skull and upper cervical spine: No focal skeletal lesion. Sinuses/Orbits: Negative Other: None IMPRESSION: Generalized atrophy without acute abnormality. Electronically Signed   By: Franchot Gallo M.D.   On: 10/23/2019 20:24   US RENAL  Result Date: 10/24/2019 CLINICAL DATA:  Acute kidney injury. EXAM: RENAL / URINARY TRACT ULTRASOUND COMPLETE COMPARISON:  CT abdomen and pelvis 06/24/2019. FINDINGS: Right Kidney: Renal measurements: 9.9 x 4.4 x 5.8 cm = volume: 132.5 mL. Echogenicity within is  increased. No mass or hydronephrosis visualized. Left Kidney: Renal measurements: 10.8 x 5.8 x 5.3 cm = volume: 173.4 mL. Echogenicity within is increased. No mass or hydronephrosis visualized. Bladder: Appears normal for degree of bladder distention. Other: None. IMPRESSION: Increased cortical echogenicity of both kidneys compatible with medical renal disease. Negative for hydronephrosis or acute abnormality. Electronically Signed   By: Inge Rise M.D.   On: 10/24/2019 10:56   DG CHEST PORT 1 VIEW  Result Date: 10/23/2019 CLINICAL DATA:  Altered mental status and fever,  history of diabetes and hypertension EXAM: PORTABLE CHEST 1 VIEW COMPARISON:  Radiograph 10/06/2016 FINDINGS: No consolidation, features of edema, pneumothorax, or effusion. The cardiomediastinal contours are unremarkable. No acute osseous or soft tissue abnormality. Redemonstrated dextrocurvature of the thoracic spine. Prior cervical spinal fusion hardware is again noted. Degenerative changes present in the shoulders and spine. Cholecystectomy clips in the right upper quadrant. Telemetry leads overlie the chest. IMPRESSION: No acute cardiopulmonary abnormality. Electronically Signed   By: Lovena Le M.D.   On: 10/23/2019 22:55   CT HEAD CODE STROKE WO CONTRAST  Result Date: 10/23/2019 CLINICAL DATA:  Code stroke.  Left-sided weakness EXAM: CT HEAD WITHOUT CONTRAST TECHNIQUE: Contiguous axial images were obtained from the base of the skull through the vertex without intravenous contrast. COMPARISON:  08/12/2019 FINDINGS: Brain: There is no acute intracranial hemorrhage, mass effect, or edema. Gray-white differentiation remains preserved. Minimal patchy hypoattenuation in the supratentorial white matter is nonspecific but may reflect minor chronic microvascular ischemic changes. Ventricles and sulci are stable in size and configuration. Vascular: No hyperdense vessel. There is intracranial atherosclerotic calcification at the skull base. Skull: Unremarkable. Sinuses/Orbits: The imaged paranasal sinuses are clear. Visualized orbits are unremarkable. Other: Mastoid air cells are clear. ASPECTS ([place] Stroke Program Early CT Score) - Ganglionic level infarction (caudate, lentiform nuclei, internal capsule, insula, M1-M3 cortex): 7 - Supraganglionic infarction (M4-M6 cortex): 3 Total score (0-10 with 10 being normal): 10 IMPRESSION: No acute intracranial hemorrhage or evidence of acute infarction. ASPECT score is 10. These results were called by telephone at the time of interpretation on 10/23/2019 at 3:03 pm to  provider [facility] , who verbally acknowledged these results. Electronically Signed   By: Macy Mis M.D.   On: 10/23/2019 15:07  [4 week]     Impression: Pleasant 64 year old female with past medical history significant for diabetes, Parkinson's, hypertension, bipolar disorder, subdural hematoma in November 2020 presenting with mental status changes.  Acute stroke ruled out.  Noted to have acute renal failure.  We were consulted for intractable vomiting.  Nausea and vomiting: Chronic intermittent symptoms.  Recent EGD showed reactive gastropathy, multiple fundic gland polyps, esophagus empirically dilated for dysphagia.  Patient is a difficult historian stating memory issues.  She does have time with recalling how long symptoms have been occurring.  She is fully aware significant amount of weight loss.  Losing over 50 pounds since 2019.  CT imaging in October 2020 without contrast unremarkable.  She is currently on erythromycin and Zofran for possible diabetic gastroparesis.  Consider gastric emptying study once over acute illness.  Abnormal weight loss: Weight down 64 pounds since August 2019, down 17 pounds since December 2020.  Complains of significant poor appetite, intermittent vomiting, diminished oral intake. ?multifactorial with comorbidities, ?gastroparesis, ?underlying malignancy or adrenal insufficiency.  Plan: Supportive measures including around the clock Zofran.  Consider short-term low dose Reglan if refractory symptoms.  Plan for gastric emptying stomach as outpatient (when [facility] Nuc Med available).  Fasting AM cortisol. Gastroparesis diet.  Will continue to follow with you.   We would like to thank you for the opportunity to participate in the care of [place].  Laureen Ochs. Bernarda Caffey [facility] [phone] 2/25/202110:54 AM  Attending note:  Agree with assessment and recommendations as outlined above.  GI symptoms  could well be central in origin.  Gastroparesis remains very much in the the differential. Diabetes appeared to be under good control lately. We will continue to follow with you.   LOS: 1 day

## 2019-10-25 NOTE — Progress Notes (Signed)
   Vital Signs MEWS/VS Documentation      10/24/2019 2303 10/25/2019 0048 10/25/2019 0510 10/25/2019 0755   MEWS Score:  3  3  3   0   MEWS Score Color:  Yellow  Yellow  Yellow  Green   Resp:  --  --  16  --   Pulse:  (!) 163  --  --  93   BP:  (!) 150/91  (!) 147/92  (!) 151/92  --   Temp:  --  --  98.7 F (37.1 C)  --   O2 Device:  Room Air  --  Room Air  --           Trudee Kuster 10/25/2019,7:55 AM  On shift change observed patient pulse 93 on telemetry monitor.

## 2019-10-25 NOTE — Progress Notes (Addendum)
MEWS/VS Documentation      10/25/2019 0755 10/25/2019 0958 10/25/2019 1012 10/25/2019 1200   MEWS Score:  0  2  2  2    MEWS Score Color:  Green  Yellow  Yellow  Yellow   Pulse:  93  (!) 117  --  (!) 120   BP:  --  (!) 179/91  --  --   Temp:  --  98.6 F (37 C)  --  --   O2 Device:  --  Room Air  --  --   Level of Consciousness:  --  --  Alert  Alert    MD notified of yellow score, STAT EKG ordered & new set of vitals to be obtained   1302 MD at beside to evaluate patient

## 2019-10-25 NOTE — Procedures (Signed)
Patient Name: Linda English  MRN: [id number]  Epilepsy Attending: Lora Havens  Referring Physician/Provider: Dr. Shanon Brow Tat Date: 10/24/2019 Duration: 24.31 minutes  Patient history: 64 year old female with history of subdural hematoma presented with altered mental status.  EEG to evaluate for seizures.  Level of alertness: Awake  AEDs during EEG study: Lamotrigine  Technical aspects: This EEG study was done with scalp electrodes positioned according to the 10-20 International system of electrode placement. Electrical activity was acquired at a sampling rate of 500Hz  and reviewed with a high frequency filter of 70Hz  and a low frequency filter of 1Hz . EEG data were recorded continuously and digitally stored.   Description: The posterior dominant rhythm consists of 7.5-8 Hz activity of moderate voltage (25-35 uV) seen predominantly in posterior head regions, symmetric and reactive to eye opening and eye closing.  EEG also showed continuous generalized 3 to 6 Hz theta-delta slowing. Hyperventilation and photic stimulation were not performed.  Abnormality -Continuous slow, generalized  IMPRESSION: This study is suggestive of mild diffuse encephalopathy, nonspecific to etiology. No seizures or epileptiform discharges were seen throughout the recording.  Linda English Linda English

## 2019-10-25 NOTE — Progress Notes (Signed)
PROGRESS NOTE  Linda English [id number] DOB: [birth year] DOA: 10/23/2019 PCP: [name], MD  Brief History:  64 year old female with a history of diabetes mellitus type 2, bipolar 1 disorder, TIA, subdural hematoma, hyperlipidemia, hypertension, GERD, chronic headaches presenting with altered mental status.  Apparently, the patient has frequent falls.  She fell and hit the back of her head on 10/22/2019.  The patient fell again in the morning of 10/23/2019 hitting the side of her face.  She went to take a nap in the morning of 10/23/2019 and woke up around 1:30 PM with confusion.  There was concern for left-sided neglect and weakness when she was evaluated by her physical therapist at home.  As result, she was brought to the hospital for further evaluation.  The patient has not had any new medications started.  She continues to have issues with vomiting or regurgitation with varying consistencies of food.  She had an EGD performed on 10/01/2019 which showed normal esophagus and erythematous mucosa in the stomach.  She takes Zofran around-the-clock which does help her nausea and vomiting, but when she runs out her vomiting recurs.  She denies any specific dysphagia or odynophagia.  Interestingly, her daughter states that she is able to keep down Ensure and ice cream reliably.  She has intermittent pill dysphagia.  She has had some intermittent loose stools but there is no hematochezia or melena.  There is no abdominal pain, chest pain, shortness of breath, coughing, hemoptysis.  Patient had a recent admission to the hospital from 06/24/2019 to 07/03/2019 with a right-sided subacute traumatic subdural hematoma secondary to a fall.  She was subsequent sent to [facility] for rehab.  In addition, she had a hospitalization from 08/12/2019 to 08/13/2019 for acute kidney injury, early DKA, dehydration.   In the emergency department, the patient had temperature 100.0 F.  She was hemodynamically stable  with oxygen saturation 100% room air.  BMP showed a serum creatinine 3.26 which is above her usual baseline.  WBC 9.0, hemoglobin 12.3, platelets 214,000.  CT and MRI of the brain without negative for acute findings.  Assessment/Plan: Acute metabolic encephalopathy -Secondary to dehydration and acute kidney injury -Continue IV fluids -Mental status improving, but not back to baseline -Serum B12--349 -Folate--8.1 -ammonia--21 -Personally reviewed chest x-ray--no consolidation or edema -Personally reviewed EKG--sinus rhythm, nonspecific T wave change -UDS--+Benzo -MRI brain--atrophy -CT brain--neg  Acute on chronic renal failure--CKD stage IIIa -Baseline creatinine 0.9-1.2 -Presented with serum creatinine 3.26 -Urinalysis quite bland -Renal ultrasound--medical renal disease -continue IVF  SVT -pt having prolonged episodes SVT/atrial tachycardia last 24 hours -personally reviewed tele and EKG--SVT, nonspecific STT changes -continue metoprolol -start po diltiazem -Echo -TSH  Intractable nausea and vomiting -GI consult appreciated-->gastroparesis diet, am cortisol, short term metoclopramide short term if refractory; outpt gastric emptying study -continue Zofran around-the-clock -Continue IV fluid -Continue Protonix  Essential hypertension -Continue metoprolol tartrate -d/c amlodipine -started diltiazem as discussed above  Bipolar disorder -Continue Lamictal  Diabetes mellitus type 2, controlled -08/12/2019 hemoglobin A1c 5.7 -Holding metformin -novolog sliding scale  Frequent falls -PT evaluation-->HHPT  Hypomagnesemia -replete  Severe Malnutrition -continue Ensure      Disposition Plan: Patient From: Home D/C Place: Home - 1-2  Days Barriers: Not Clinically Stable--SVT today; GI service clearance; diet tolerance  Family Communication:   Daughter updated 2/24  Consultants:  GI  Code Status:  FULL   DVT Prophylaxis:  [place] Heparin     Procedures: As  Listed in Progress Note Above  Antibiotics: None    Subjective: Patient denies fevers, chills, headache, chest pain, dyspnea, nausea, vomiting, diarrhea, abdominal pain, dysuria, hematuria, hematochezia, and melena.   Objective: Vitals:   10/25/19 0755 10/25/19 0958 10/25/19 1200 10/25/19 1256  BP:  (!) 179/91  129/71  Pulse: 93 (!) 117 (!) 120 (!) 117  Resp:      Temp:  98.6 F (37 C)  99.6 F (37.6 C)  TempSrc:  Oral  Oral  SpO2:  100%  100%  Weight:      Height:        Intake/Output Summary (Last 24 hours) at 10/25/2019 1308 Last data filed at 10/25/2019 1004 Gross per 24 hour  Intake 2060.2 ml  Output 600 ml  Net 1460.2 ml   Weight change:  Exam:   General:  Pt is alert, follows commands appropriately, not in acute distress  HEENT: No icterus, No thrush, No neck mass, /AT  Cardiovascular: RRR, S1/S2, no rubs, no gallops  Respiratory: CTA bilaterally, no wheezing, no crackles, no rhonchi  Abdomen: Soft/+BS, non tender, non distended, no guarding  Extremities: No edema, No lymphangitis, No petechiae, No rashes, no synovitis   Data Reviewed: I have personally reviewed following labs and imaging studies Basic Metabolic Panel: Recent Labs  Lab 10/23/19 1459 10/23/19 1540 10/23/19 2303 10/24/19 0516 10/25/19 1034  NA 140 141  --  141 133*  K 3.3* 3.2*  --  3.7 3.5  CL 98 101  --  102 97*  CO2 26  --   --  26 21*  GLUCOSE 131* 124*  --  108* 130*  BUN 59* 49*  --  60* 21  CREATININE 3.26* 3.40*  --  3.25* 1.23*  CALCIUM 9.8  --   --  9.3 8.8*  MG  --   --  1.8  --  1.1*   Liver Function Tests: Recent Labs  Lab 10/23/19 1459  AST 20  ALT 12  ALKPHOS 90  BILITOT 0.5  PROT 7.8  ALBUMIN 4.6   No results for input(s): LIPASE, AMYLASE in the last 168 hours. Recent Labs  Lab 10/24/19 0935  AMMONIA 21   Coagulation Profile: Recent Labs  Lab 10/23/19 1459  INR 1.0   CBC: Recent Labs  Lab 10/23/19 1459  10/23/19 1540 10/25/19 1034  WBC 9.0  --  6.6  NEUTROABS 6.2  --   --   HGB 12.3 12.9 11.8*  HCT 38.2 38.0 36.5  MCV 85.1  --  83.5  PLT 314  --  297   Cardiac Enzymes: No results for input(s): CKTOTAL, CKMB, CKMBINDEX, TROPONINI in the last 168 hours. BNP: Invalid input(s): POCBNP CBG: Recent Labs  Lab 10/24/19 1121 10/24/19 1613 10/24/19 2136 10/25/19 0720 10/25/19 1135  GLUCAP 122* 119* 98 113* 183*   HbA1C: Recent Labs    10/23/19 1459  HGBA1C 5.8*   Urine analysis:    Component Value Date/Time   COLORURINE YELLOW 10/23/2019 1610   APPEARANCEUR HAZY (A) 10/23/2019 1610   LABSPEC 1.016 10/23/2019 1610   PHURINE 5.0 10/23/2019 1610   GLUCOSEU NEGATIVE 10/23/2019 1610   HGBUR SMALL (A) 10/23/2019 1610   BILIRUBINUR NEGATIVE 10/23/2019 1610   KETONESUR NEGATIVE 10/23/2019 1610   PROTEINUR 30 (A) 10/23/2019 1610   UROBILINOGEN 1.0 06/07/2012 2238   NITRITE NEGATIVE 10/23/2019 1610   LEUKOCYTESUR TRACE (A) 10/23/2019 1610   Sepsis Labs: @LABRCNTIP (procalcitonin:4,lacticidven:4) ) Recent Results (from the past 240 hour(s))  SARS CORONAVIRUS 2 (  Sheran Newstrom 6-24 HRS) Nasopharyngeal Nasopharyngeal Swab     Status: None   Collection Time: 10/23/19  5:57 PM   Specimen: Nasopharyngeal Swab  Result Value Ref Range Status   SARS Coronavirus 2 NEGATIVE NEGATIVE Final    Comment: (NOTE) SARS-CoV-2 target nucleic acids are NOT DETECTED. The SARS-CoV-2 RNA is generally detectable in upper and lower respiratory specimens during the acute phase of infection. Negative results do not preclude SARS-CoV-2 infection, do not rule out co-infections with other pathogens, and should not be used as the sole basis for treatment or other patient management decisions. Negative results must be combined with clinical observations, patient history, and epidemiological information. The expected result is Negative. Fact Sheet for Patients: SugarRoll.be Fact Sheet  for Healthcare Providers: https://www.woods-mathews.com/ This test is not yet approved or cleared by the [place] FDA and  has been authorized for detection and/or diagnosis of SARS-CoV-2 by FDA under an Emergency Use Authorization (EUA). This EUA will remain  in effect (meaning this test can be used) for the duration of the COVID-19 declaration under Section 56 4(b)(1) of the Act, 21 U.S.C. section 360bbb-3(b)(1), unless the authorization is terminated or revoked sooner. Performed at [facility], [place] [address], [place], [place] [address]   Culture, blood (routine x 2)     Status: None (Preliminary result)   Collection Time: 10/23/19 11:02 PM   Specimen: BLOOD  Result Value Ref Range Status   Specimen Description BLOOD RIGHT ANTECUBITAL  Final   Special Requests   Final    BOTTLES DRAWN AEROBIC AND ANAEROBIC Blood Culture adequate volume   Culture   Final    NO GROWTH 2 DAYS Performed at [facility], [address], [place], [place] [address]    Report Status PENDING  Incomplete  Culture, blood (routine x 2)     Status: None (Preliminary result)   Collection Time: 10/23/19 11:08 PM   Specimen: BLOOD  Result Value Ref Range Status   Specimen Description BLOOD BLOOD LEFT WRIST  Final   Special Requests   Final    BOTTLES DRAWN AEROBIC AND ANAEROBIC Blood Culture adequate volume   Culture   Final    NO GROWTH 2 DAYS Performed at [facility], [address], [place], [place] [address]    Report Status PENDING  Incomplete     Scheduled Meds: . amLODipine  5 mg Oral Daily  . feeding supplement (ENSURE ENLIVE)  237 mL Oral BID BM  . heparin  5,000 Units Subcutaneous Q8H  . insulin aspart  0-5 Units Subcutaneous QHS  . insulin aspart  0-9 Units Subcutaneous TID WC  . lamoTRIgine  200 mg Oral BID  . metoprolol tartrate  25 mg Oral BID  . multivitamin with minerals  1 tablet Oral Daily  . ondansetron (ZOFRAN) IV  4 mg Intravenous TID AC  . pantoprazole   40 mg Oral BID AC   Continuous Infusions: . 0.9 % NaCl with KCl 20 mEq / L 75 mL/hr at 10/25/19 0043  . magnesium sulfate bolus IVPB 2 g (10/25/19 1223)    Procedures/Studies: MR ANGIO HEAD WO CONTRAST  Result Date: 10/23/2019 CLINICAL DATA:  Focal neuro deficit.  Fall yesterday. EXAM: MRA HEAD WITHOUT CONTRAST TECHNIQUE: Angiographic images of the Circle of Willis were obtained using MRA technique without intravenous contrast. COMPARISON:  CTA head and neck 08/09/2015 FINDINGS: Both vertebral arteries patent to the basilar. PICA patent bilaterally. Basilar widely patent. Posterior cerebral arteries widely patent bilaterally. Internal carotid artery  widely patent bilaterally. Hypoplastic left A1 segment. Both anterior cerebral ups are patent and supplied from the right. Middle cerebral arteries patent bilaterally without stenosis or large vessel occlusion Negative for cerebral aneurysm. IMPRESSION: Negative MRA head Electronically Signed   By: Franchot Gallo M.D.   On: 10/23/2019 20:31   MR ANGIO NECK WO CONTRAST  Result Date: 10/23/2019 CLINICAL DATA:  Neuro deficit EXAM: MRA NECK WITHOUT CONTRAST TECHNIQUE: Angiographic images of the neck were obtained using MRA technique without intravenous contrast. Carotid stenosis measurements (when applicable) are obtained utilizing NASCET criteria, using the distal internal carotid diameter as the denominator. COMPARISON:  CT a head and neck 08/09/2015. FINDINGS: MRA NECK FINDINGS Both vertebral arteries are patent with antegrade flow. Moderate stenosis proximal left vertebral artery. Left vertebral artery was widely patent on prior CTA raising the possibility of artifact on today's study. Right vertebral artery widely patent Carotid bifurcation widely patent bilaterally without stenosis or irregularity IMPRESSION: Both carotid arteries widely patent. Moderate stenosis proximal left vertebral artery. Electronically Signed   By: Franchot Gallo M.D.   On:  10/23/2019 20:33   MR BRAIN WO CONTRAST  Result Date: 10/23/2019 CLINICAL DATA:  Focal neuro deficit.  Fall yesterday EXAM: MRI HEAD WITHOUT CONTRAST TECHNIQUE: Multiplanar, multiecho pulse sequences of the brain and surrounding structures were obtained without intravenous contrast. COMPARISON:  CT head today. FINDINGS: Brain: Negative for acute infarct. Mild atrophy without hydrocephalus. Negative for hemorrhage mass or fluid collection. Vascular: Normal arterial flow voids. Skull and upper cervical spine: No focal skeletal lesion. Sinuses/Orbits: Negative Other: None IMPRESSION: Generalized atrophy without acute abnormality. Electronically Signed   By: Franchot Gallo M.D.   On: 10/23/2019 20:24   US RENAL  Result Date: 10/24/2019 CLINICAL DATA:  Acute kidney injury. EXAM: RENAL / URINARY TRACT ULTRASOUND COMPLETE COMPARISON:  CT abdomen and pelvis 06/24/2019. FINDINGS: Right Kidney: Renal measurements: 9.9 x 4.4 x 5.8 cm = volume: 132.5 mL. Echogenicity within is increased. No mass or hydronephrosis visualized. Left Kidney: Renal measurements: 10.8 x 5.8 x 5.3 cm = volume: 173.4 mL. Echogenicity within is increased. No mass or hydronephrosis visualized. Bladder: Appears normal for degree of bladder distention. Other: None. IMPRESSION: Increased cortical echogenicity of both kidneys compatible with medical renal disease. Negative for hydronephrosis or acute abnormality. Electronically Signed   By: Inge Rise M.D.   On: 10/24/2019 10:56   DG CHEST PORT 1 VIEW  Result Date: 10/23/2019 CLINICAL DATA:  Altered mental status and fever, history of diabetes and hypertension EXAM: PORTABLE CHEST 1 VIEW COMPARISON:  Radiograph 10/06/2016 FINDINGS: No consolidation, features of edema, pneumothorax, or effusion. The cardiomediastinal contours are unremarkable. No acute osseous or soft tissue abnormality. Redemonstrated dextrocurvature of the thoracic spine. Prior cervical spinal fusion hardware is again  noted. Degenerative changes present in the shoulders and spine. Cholecystectomy clips in the right upper quadrant. Telemetry leads overlie the chest. IMPRESSION: No acute cardiopulmonary abnormality. Electronically Signed   By: Lovena Le M.D.   On: 10/23/2019 22:55   EEG adult  Result Date: 10/25/2019 Lora Havens, MD     10/25/2019  9:53 AM Patient Name: Linda English MRN: [id number] Epilepsy Attending: Lora Havens Referring Physician/Provider: Dr. Shanon Brow Deyjah Kindel Date: 10/24/2019 Duration: 24.31 minutes Patient history: 64 year old female with history of subdural hematoma presented with altered mental status.  EEG to evaluate for seizures. Level of alertness: Awake AEDs during EEG study: Lamotrigine Technical aspects: This EEG study was done with scalp electrodes positioned according to the 10-20  International system of electrode placement. Electrical activity was acquired at a sampling rate of 500Hz  and reviewed with a high frequency filter of 70Hz  and a low frequency filter of 1Hz . EEG data were recorded continuously and digitally stored. Description: The posterior dominant rhythm consists of 7.5-8 Hz activity of moderate voltage (25-35 uV) seen predominantly in posterior head regions, symmetric and reactive to eye opening and eye closing.  EEG also showed continuous generalized 3 to 6 Hz theta-delta slowing. Hyperventilation and photic stimulation were not performed. Abnormality -Continuous slow, generalized IMPRESSION: This study is suggestive of mild diffuse encephalopathy, nonspecific to etiology. No seizures or epileptiform discharges were seen throughout the recording. Lora Havens   CT HEAD CODE STROKE WO CONTRAST  Result Date: 10/23/2019 CLINICAL DATA:  Code stroke.  Left-sided weakness EXAM: CT HEAD WITHOUT CONTRAST TECHNIQUE: Contiguous axial images were obtained from the base of the skull through the vertex without intravenous contrast. COMPARISON:  08/12/2019 FINDINGS: Brain:  There is no acute intracranial hemorrhage, mass effect, or edema. Gray-white differentiation remains preserved. Minimal patchy hypoattenuation in the supratentorial white matter is nonspecific but may reflect minor chronic microvascular ischemic changes. Ventricles and sulci are stable in size and configuration. Vascular: No hyperdense vessel. There is intracranial atherosclerotic calcification at the skull base. Skull: Unremarkable. Sinuses/Orbits: The imaged paranasal sinuses are clear. Visualized orbits are unremarkable. Other: Mastoid air cells are clear. ASPECTS ([place] Stroke Program Early CT Score) - Ganglionic level infarction (caudate, lentiform nuclei, internal capsule, insula, M1-M3 cortex): 7 - Supraganglionic infarction (M4-M6 cortex): 3 Total score (0-10 with 10 being normal): 10 IMPRESSION: No acute intracranial hemorrhage or evidence of acute infarction. ASPECT score is 10. These results were called by telephone at the time of interpretation on 10/23/2019 at 3:03 pm to provider [facility] , who verbally acknowledged these results. Electronically Signed   By: Macy Mis M.D.   On: 10/23/2019 15:07    Orson Eva, DO  [facility]  If 7PM-7AM, please contact night-coverage [email or web address] Password [email or web address] 10/25/2019, 1:08 PM   LOS: 1 day

## 2019-10-25 NOTE — Progress Notes (Signed)
MD made aware of bp 150/91. New orders rendered.

## 2019-10-26 ENCOUNTER — Other Ambulatory Visit: Payer: Self-pay | Admitting: Nurse Practitioner

## 2019-10-26 ENCOUNTER — Telehealth: Payer: Self-pay | Admitting: Nurse Practitioner

## 2019-10-26 DIAGNOSIS — G9341 Metabolic encephalopathy: Secondary | ICD-10-CM

## 2019-10-26 LAB — BASIC METABOLIC PANEL
Anion gap: 12 (ref 5–15)
BUN: 19 mg/dL (ref 8–23)
CO2: 25 mmol/L (ref 22–32)
Calcium: 9.3 mg/dL (ref 8.9–10.3)
Chloride: 94 mmol/L — ABNORMAL LOW (ref 98–111)
Creatinine, Ser: 1.06 mg/dL — ABNORMAL HIGH (ref 0.44–1.00)
GFR calc Af Amer: >60 mL/min (ref >60)
GFR calc non Af Amer: 56 mL/min — ABNORMAL LOW (ref >60)
Glucose, Bld: 163 mg/dL — ABNORMAL HIGH (ref 70–99)
Potassium: 3.7 mmol/L (ref 3.5–5.1)
Sodium: 131 mmol/L — ABNORMAL LOW (ref 135–145)

## 2019-10-26 LAB — MAGNESIUM: Magnesium: 1.5 mg/dL — ABNORMAL LOW (ref 1.7–2.4)

## 2019-10-26 LAB — GLUCOSE, CAPILLARY
Glucose-Capillary: 132 mg/dL — ABNORMAL HIGH (ref 70–99)
Glucose-Capillary: 144 mg/dL — ABNORMAL HIGH (ref 70–99)

## 2019-10-26 LAB — TSH: TSH: 2.048 u[IU]/mL (ref 0.350–4.500)

## 2019-10-26 LAB — CORTISOL-AM, BLOOD: Cortisol - AM: 40.3 ug/dL — ABNORMAL HIGH (ref 6.7–22.6)

## 2019-10-26 LAB — T4, FREE: Free T4: 1.41 ng/dL — ABNORMAL HIGH (ref 0.61–1.12)

## 2019-10-26 MED ORDER — DILTIAZEM HCL 30 MG PO TABS: 30.0000 mg | ORAL_TABLET | Freq: Four times a day (QID) | ORAL | Status: DC

## 2019-10-26 MED ORDER — DILTIAZEM HCL ER COATED BEADS 120 MG PO CP24: 120.0000 mg | ORAL_CAPSULE | Freq: Every day | ORAL | 1 refills | Status: DC

## 2019-10-26 MED ORDER — DILTIAZEM HCL ER COATED BEADS 120 MG PO CP24
120.0000 mg | ORAL_CAPSULE | Freq: Every day | ORAL | Status: DC
  Administered 2019-10-26: 120 mg via ORAL
  Filled 2019-10-26: qty 1

## 2019-10-26 MED ORDER — DILTIAZEM HCL 60 MG PO TABS: 60.0000 mg | ORAL_TABLET | Freq: Four times a day (QID) | ORAL | Status: DC

## 2019-10-26 MED ORDER — MAGNESIUM SULFATE 2 GM/50ML IV SOLN
2.0000 g | Freq: Once | INTRAVENOUS | Status: AC
  Administered 2019-10-26: 2 g via INTRAVENOUS
  Filled 2019-10-26: qty 50

## 2019-10-26 NOTE — Progress Notes (Signed)
Subjective: Feeling better today. Nausea improved with ATC Zofran. Tolerating breakfast so far. Denies abdominal pain, recent vomiting. No other overt GI complaints.  Objective: Vital signs in last 24 hours: Temp:  [98.5 F (36.9 C)-99.6 F (37.6 C)] 98.5 F (36.9 C) (02/26 0600) Pulse Rate:  [89-130] 115 (02/26 0600) Resp:  [14-16] 14 (02/26 0600) BP: (129-179)/(71-91) 130/82 (02/26 0600) SpO2:  [93 %-100 %] 96 % (02/26 0600) Last BM Date: 10/24/19 General:   Alert and oriented, pleasant Head:  Normocephalic and atraumatic. Eyes:  No icterus, sclera clear. Conjuctiva pink.  Heart:  S1, S2 present, no murmurs noted.  Lungs: Clear to auscultation bilaterally, without wheezing, rales, or rhonchi.  Abdomen:  Bowel sounds present, soft, non-tender, non-distended. No HSM or hernias noted. No rebound or guarding. No masses appreciated  Msk:  Symmetrical without gross deformities. Pulses:  Normal pulses noted. Extremities:  Without clubbing or edema. Neurologic:  Alert and  oriented x4;  grossly normal neurologically. Psych:  Alert and cooperative. Normal mood and affect.  Intake/Output from previous day: 02/25 0701 - 02/26 0700 In: 2406.1 [P.O.:1320; I.V.:1036.1; IV Piggyback:50] Out: 2950 [Urine:2950] Intake/Output this shift: No intake/output data recorded.  Lab Results: Recent Labs    10/23/19 1459 10/23/19 1540 10/25/19 1034  WBC 9.0  --  6.6  HGB 12.3 12.9 11.8*  HCT 38.2 38.0 36.5  PLT 314  --  297   BMET Recent Labs    10/24/19 0516 10/25/19 1034 10/26/19 0518  NA 141 133* 131*  K 3.7 3.5 3.7  CL 102 97* 94*  CO2 26 21* 25  GLUCOSE 108* 130* 163*  BUN 60* 21 19  CREATININE 3.25* 1.23* 1.06*  CALCIUM 9.3 8.8* 9.3   LFT Recent Labs    10/23/19 1459  PROT 7.8  ALBUMIN 4.6  AST 20  ALT 12  ALKPHOS 90  BILITOT 0.5   PT/INR Recent Labs    10/23/19 1459  LABPROT 13.3  INR 1.0   Hepatitis Panel No results for input(s): HEPBSAG, HCVAB,  HEPAIGM, HEPBIGM in the last 72 hours.   Studies/Results: US RENAL  Result Date: 10/24/2019 CLINICAL DATA:  Acute kidney injury. EXAM: RENAL / URINARY TRACT ULTRASOUND COMPLETE COMPARISON:  CT abdomen and pelvis 06/24/2019. FINDINGS: Right Kidney: Renal measurements: 9.9 x 4.4 x 5.8 cm = volume: 132.5 mL. Echogenicity within is increased. No mass or hydronephrosis visualized. Left Kidney: Renal measurements: 10.8 x 5.8 x 5.3 cm = volume: 173.4 mL. Echogenicity within is increased. No mass or hydronephrosis visualized. Bladder: Appears normal for degree of bladder distention. Other: None. IMPRESSION: Increased cortical echogenicity of both kidneys compatible with medical renal disease. Negative for hydronephrosis or acute abnormality. Electronically Signed   By: Inge Rise M.D.   On: 10/24/2019 10:56   EEG adult  Result Date: 10/25/2019 Lora Havens, MD     10/25/2019  9:53 AM Patient Name: Linda English MRN: [id number] Epilepsy Attending: Lora Havens Referring Physician/Provider: Dr. Shanon Brow Tat Date: 10/24/2019 Duration: 24.31 minutes Patient history: 64 year old female with history of subdural hematoma presented with altered mental status.  EEG to evaluate for seizures. Level of alertness: Awake AEDs during EEG study: Lamotrigine Technical aspects: This EEG study was done with scalp electrodes positioned according to the 10-20 International system of electrode placement. Electrical activity was acquired at a sampling rate of 500Hz  and reviewed with a high frequency filter of 70Hz  and a low frequency filter of 1Hz . EEG data were recorded continuously and digitally  stored. Description: The posterior dominant rhythm consists of 7.5-8 Hz activity of moderate voltage (25-35 uV) seen predominantly in posterior head regions, symmetric and reactive to eye opening and eye closing.  EEG also showed continuous generalized 3 to 6 Hz theta-delta slowing. Hyperventilation and photic stimulation  were not performed. Abnormality -Continuous slow, generalized IMPRESSION: This study is suggestive of mild diffuse encephalopathy, nonspecific to etiology. No seizures or epileptiform discharges were seen throughout the recording. Lora Havens   ECHOCARDIOGRAM COMPLETE  Result Date: 10/25/2019    ECHOCARDIOGRAM REPORT   Patient Name:   Linda English Date of Exam: 10/25/2019 Medical Rec #:  [id number]              Height:       61.0 in Accession #:    [id number]             Weight:       101.0 lb Date of Birth:  Apr 22, 1956             BSA:          1.413 m Patient Age:    67 years               BP:           129/71 mmHg Patient Gender: F                      HR:           117 bpm. Exam Location:  [facility] Procedure: 2D Echo, Cardiac Doppler and Color Doppler Indications:    SVT (supraventricular tachycardia)  History:        Patient has prior history of Echocardiogram examinations, most                 recent 08/10/2015. Risk Factors:Hypertension, Diabetes and                 Dyslipidemia. Acute renal failure superimposed on stage 3                 chronic kidney disease,Bipolar 1 disorder,OBSTRUCTIVE SLEEP                 APNEA.  Sonographer:    Alvino Chapel RCS Referring Phys: [id number] DAVID TAT IMPRESSIONS  1. Left ventricular ejection fraction, by estimation, is 55 to 60%. The left ventricle has normal function. Left ventricular endocardial border not optimally defined to evaluate regional wall motion. Left ventricular diastolic parameters are indeterminate.  2. Right ventricular systolic function is normal. The right ventricular size is normal. There is normal pulmonary artery systolic pressure.  3. The mitral valve is normal in structure and function. Mild mitral valve regurgitation. No evidence of mitral stenosis.  4. The aortic valve is tricuspid. Aortic valve regurgitation is moderate. FINDINGS  Left Ventricle: Left ventricular ejection fraction, by estimation, is 55 to 60%. The left ventricle  has normal function. Left ventricular endocardial border not optimally defined to evaluate regional wall motion. The left ventricular internal cavity size was normal in size. There is no left ventricular hypertrophy. Left ventricular diastolic parameters are indeterminate. Right Ventricle: The right ventricular size is normal. No increase in right ventricular wall thickness. Right ventricular systolic function is normal. There is normal pulmonary artery systolic pressure. The tricuspid regurgitant velocity is 2.27 m/s, and  with an assumed right atrial pressure of 3 mmHg, the estimated right ventricular systolic pressure is 123456 mmHg. Left Atrium: Left atrial size  was normal in size. Right Atrium: Right atrial size was normal in size. Pericardium: There is no evidence of pericardial effusion. Mitral Valve: The mitral valve is normal in structure and function. Mild mitral valve regurgitation. No evidence of mitral valve stenosis. Tricuspid Valve: The tricuspid valve is normal in structure. Tricuspid valve regurgitation is trivial. No evidence of tricuspid stenosis. Aortic Valve: The aortic valve is tricuspid. Aortic valve regurgitation is moderate. Aortic regurgitation PHT measures 212 msec. Aortic valve mean gradient measures 4.7 mmHg. Aortic valve peak gradient measures 8.6 mmHg. Aortic valve area, by VTI measures 1.66 cm. Pulmonic Valve: The pulmonic valve was not well visualized. Pulmonic valve regurgitation is not visualized. No evidence of pulmonic stenosis. Aorta: The aortic root is normal in size and structure. Pulmonary Artery: Indeterminant PASP, inadequate TR jet. IAS/Shunts: No atrial level shunt detected by color flow Doppler.  LEFT VENTRICLE PLAX 2D LVIDd:         3.48 cm     Diastology LVIDs:         2.20 cm     LV e' lateral:   14.10 cm/s LV PW:         0.80 cm     LV E/e' lateral: 7.9 LV IVS:        0.84 cm     LV e' medial:    4.79 cm/s LVOT diam:     1.80 cm     LV E/e' medial:  23.4 LV SV:          46 LV SV Index:   33 LVOT Area:     2.54 cm  LV Volumes (MOD) LV vol d, MOD A2C: 50.9 ml LV vol d, MOD A4C: 41.4 ml LV vol s, MOD A2C: 22.7 ml LV vol s, MOD A4C: 16.5 ml LV SV MOD A2C:     28.2 ml LV SV MOD A4C:     41.4 ml LV SV MOD BP:      28.5 ml RIGHT VENTRICLE RV S prime:     9.95 cm/s TAPSE (M-mode): 1.8 cm LEFT ATRIUM           Index       RIGHT ATRIUM          Index LA diam:      3.20 cm 2.27 cm/m  RA Area:     9.20 cm LA Vol (A2C): 29.0 ml 20.53 ml/m RA Volume:   18.50 ml 13.09 ml/m LA Vol (A4C): 31.5 ml 22.30 ml/m  AORTIC VALVE AV Area (Vmax):    1.65 cm AV Area (Vmean):   1.66 cm AV Area (VTI):     1.66 cm AV Vmax:           146.61 cm/s AV Vmean:          102.436 cm/s AV VTI:            0.278 m AV Peak Grad:      8.6 mmHg AV Mean Grad:      4.7 mmHg LVOT Vmax:         95.30 cm/s LVOT Vmean:        67.000 cm/s LVOT VTI:          0.181 m LVOT/AV VTI ratio: 0.65 AI PHT:            212 msec  AORTA Ao Root diam: 3.00 cm MITRAL VALVE                TRICUSPID VALVE MV Area (  PHT): 4.80 cm     TR Peak grad:   20.6 mmHg MV Decel Time: 158 msec     TR Vmax:        227.00 cm/s MV E velocity: 112.00 cm/s                             SHUNTS                             Systemic VTI:  0.18 m                             Systemic Diam: 1.80 cm Carlyle Dolly MD Electronically signed by Carlyle Dolly MD Signature Date/Time: 10/25/2019/3:33:45 PM    Final     Assessment: Pleasant 64 year old female with past medical history significant for diabetes Hgb a1c 10/23/19 was 5.8), Parkinson's, hypertension, bipolar disorder, subdural hematoma in November 2020 presenting with mental status changes.  Acute stroke ruled out.  Noted to have acute renal failure (baseline CKD IIIa).  We were consulted for intractable vomiting.  Nausea and vomiting- Chronic intermittent symptoms.  Recent EGD showed reactive gastropathy, multiple fundic gland polyps, esophagus empirically dilated for dysphagia.  Patient is a difficult  historian stating memory issues.  She does have time with recalling how long symptoms have been occurring.  She is fully aware significant amount of weight loss, over 50 pounds since 2019.  CT imaging in October 2020 without contrast unremarkable.  She is currently on erythromycin and Zofran for possible diabetic gastroparesis.  Started on ATC Zofran yesterday, gastroparesis diet, am cortisol. Cortisol ordered for 7:00 am but drawn at 5:00 am and elevated 40.3. Clinically improved today, no N/V this morning and tolerating diet.  Abnormal weight loss- Weight down 64 pounds since August 2019, down 17 pounds since December 2020.  Complains of significant poor appetite, intermittent vomiting, diminished oral intake. Query multifactorial with comorbidities including gastroparesis vs. underlying malignancy vs. adrenal insufficiency. Colonoscopy up to date 2016 s/p rectal CA and resection, which found normal residual rectum and colon. Due for repeat TCS 12/2019. Will likely need further workup as outpatient including updated colonoscopy.  Plan: 1. Continued Zofran at d/c 2. Primary Care follow-up 3. Query need endocrine evaluation for elevated morning cortisol 4. GI outpatient follow-up for weight loss work-up and hospital follow-up 5. Supportive measures 6. Anticipate d/c in the next 24-48 hours.   Thank you for allowing [place] to participate in the care of [place], DNP, AGNP-C Adult & Gerontological Nurse Practitioner [facility]    LOS: 2 days    10/26/2019, 8:10 AM

## 2019-10-26 NOTE — TOC Transition Note (Signed)
Transition of Care [facility]) - CM/SW Discharge Note   Patient Details  Name: Linda English MRN: [id number] Date of Birth: 1956-07-09  Transition of Care [facility]) CM/SW Contact:  Ihor Gully, LCSW Phone Number: 10/26/2019, 1:06 PM   Clinical Narrative:    Message and secure email sent to Santiago Glad with [facility] advising of patient's discharge and [place] orders. TOC signing off.    Final next level of care: [place] Barriers to Discharge: Continued Medical Work up   Patient Goals and CMS Choice Patient states their goals for this hospitalization and ongoing recovery are:: Return home      Discharge Placement                       Discharge Plan and Services                          HH Arranged: PT, RN [facility] Agency: [place]        Social Determinants of Health (SDOH) Interventions     Readmission Risk Interventions Readmission Risk Prevention Plan 07/03/2019  Transportation Screening Complete  PCP or Specialist Appt within 5-7 Days Not Complete  Not Complete comments SNF MD will follow  Home Care Screening Not Complete  Home Care Screening Not Completed Comments Pt going to SNF  Medication Review (RN CM) Complete  Some recent data might be hidden

## 2019-10-26 NOTE — Progress Notes (Signed)
Discussed AVS including medications & need for follow up appointments with the patient's daughter Denita Lung and all questioned fully answered. Patient taken with personal belongings via wheelchair to discharge area, where ride was waiting.

## 2019-10-26 NOTE — Discharge Summary (Signed)
Physician Discharge Summary  [name] [id number] DOB: July 27, 1956 DOA: 10/23/2019  PCP: [name], MD  Admit date: 10/23/2019 Discharge date: 10/26/2019  Admitted From: Home Disposition:  Home   Recommendations for Outpatient Follow-up:  1. Follow up with PCP in 1-2 weeks   Home Health: YES Equipment/Devices: HHPT  Discharge Condition: Stable CODE STATUS: FULL Diet recommendation: Heart Healthy / Carb Modified   Brief/Interim Summary: 64 year old female with a history of diabetes mellitus type 2, bipolar 1 disorder, TIA, subdural hematoma, hyperlipidemia, hypertension, GERD, chronic headaches presenting with altered mental status. Apparently, the patient has frequent falls. She fell and hit the back of her head on 10/22/2019. The patient fell again in the morning of 10/23/2019 hitting the side of her face. She went to take a nap in the morning of 10/23/2019 and woke up around 1:30 PM with confusion. There was concern for left-sided neglect and weakness when she was evaluated by her physical therapist at home. As result, she was brought to the hospital for further evaluation. The patient has not had any new medications started. She continues to have issues with vomiting or regurgitation with varying consistencies of food. She had an EGD performed on 10/01/2019 which showed normal esophagus and erythematous mucosa in the stomach. She takes Zofran around-the-clock which does help her nausea and vomiting, but when she runs out her vomiting recurs. She denies any specific dysphagia or odynophagia. Interestingly, her daughter states that she is able to keep down Ensure and ice cream reliably. She has intermittent pill dysphagia. She has had some intermittent loose stools but there is no hematochezia or melena. There is no abdominal pain, chest pain, shortness of breath, coughing, hemoptysis. Patient had a recent admission to the hospital from 06/24/2019 to 07/03/2019 with  a right-sided subacute traumatic subdural hematoma secondary to a fall. She was subsequent sent to [facility] for rehab. In addition, she had a hospitalization from 08/12/2019 to 08/13/2019 for acute kidney injury, early DKA, dehydration. In the emergency department, the patient had temperature 100.0 F. She was hemodynamically stable with oxygen saturation 100% room air. BMP showed a serum creatinine 3.26 which is above her usual baseline. WBC 9.0, hemoglobin 12.3, platelets 214,000. CT and MRI of the brain without negative for acute findings.   Discharge Diagnoses:  Acute metabolic encephalopathy -Secondary to dehydration and acute kidney injury -Continue IV fluids -Mental status improved -Serum B12--349 -Folate--8.1 -ammonia--21 -Personally reviewed chest x-ray--no consolidation or edema -Personally reviewed EKG--sinus rhythm, nonspecific T wave change -UDS--+Benzo -MRI brain--atrophy -CT brain--neg  Acute on chronic renal failure--CKD stage IIIa -Baseline creatinine 0.9-1.2 -Presented with serum creatinine 3.26 -Urinalysis quite bland -Renal ultrasound--medical renal disease -continue IVF-->serum creatinine 1.06 on day of d/c  SVT -pt having prolonged episodes SVT/atrial tachycardia last 24 hours -personally reviewed tele and EKG--SVT, nonspecific STT changes -continue metoprolol -start po diltiazem-->diltiazem CD 120 mg -Echo--EF 55-60%, mild MR, trivial TR -TSH--2.048 -rate controlled with adding diltiazem  Intractable nausea and vomiting -GI consult appreciated-->gastroparesis diet, am cortisol, short term metoclopramide short term if refractory; outpt gastric emptying study -continue Zofran with meals -Continue IV fluid -Continue Protonix -pt able to tolerate soft foods and peanut butter crackers  -2/26--discussed with GI--ok to d/c with out pt gastric emptying study to be scheduled -am cortisol 40.3 -small frequent meals  Essential  hypertension -Continue metoprolol tartrate -d/c amlodipine -started diltiazem as discussed above  Bipolar disorder -Continue Lamictal  Diabetes mellitus type 2, controlled -08/12/2019 hemoglobin A1c 5.7 -Holding metformin--resume after d/c -novolog sliding  scale  Frequent falls -PT evaluation-->HHPT  Hypomagnesemia -repleted  Severe Malnutrition -continue Ensure      Discharge Instructions   Allergies as of 10/26/2019      Reactions   Shellfish Allergy Anaphylaxis   Lithium Nausea And Vomiting   Excedrin Extra Strength [aspirin-acetaminophen-caffeine] Other (See Comments)   Makes her nervous.   Klonopin [clonazepam] Other (See Comments)   hallucinations      Medication List    STOP taking these medications   amLODipine 5 MG tablet Commonly known as: NORVASC   chlorthalidone 25 MG tablet Commonly known as: HYGROTON     TAKE these medications   ALPRAZolam 0.5 MG tablet Commonly known as: XANAX Take 1 tablet (0.5 mg total) by mouth 3 (three) times daily as needed for anxiety or sleep.   diltiazem 120 MG 24 hr capsule Commonly known as: CARDIZEM CD Take 1 capsule (120 mg total) by mouth daily.   erythromycin 250 MG tablet Commonly known as: E-MYCIN Take 250 mg by mouth 4 (four) times daily.   insulin aspart 100 UNIT/ML injection Commonly known as: NovoLOG insulin aspart (novoLOG) injection 0-9 Units 0-9 Units, Subcutaneous, 3 times daily with meals, CBG < 70: Implement Hypoglycemia Standing Orders and refer to Hypoglycemia Standing Orders sidebar report  CBG 70 - 120: 0 units CBG 121 - 150: 1 unit CBG 151 - 200: 2 units CBG 201 - 250: 3 units CBG 251 - 300: 5 units CBG 301 - 350: 7 units CBG 351 - 400: 9 units CBG > 400:   lamoTRIgine 200 MG tablet Commonly known as: LAMICTAL Take 200 mg by mouth 2 (two) times daily.   linaclotide 72 MCG capsule Commonly known as: Linzess Take 1 capsule (72 mcg total) by mouth daily before breakfast. What  changed:   when to take this  reasons to take this   metFORMIN 500 MG 24 hr tablet Commonly known as: GLUCOPHAGE-XR Take 500 mg by mouth 2 (two) times daily.   metoprolol tartrate 25 MG tablet Commonly known as: LOPRESSOR Take 1 tablet (25 mg total) by mouth 2 (two) times daily.   midodrine 10 MG tablet Commonly known as: PROAMATINE Take 10 mg by mouth 3 (three) times daily.   ondansetron 4 MG tablet Commonly known as: ZOFRAN Take 1 tablet (4 mg total) by mouth 3 (three) times daily before meals. What changed: Another medication with the same name was removed. Continue taking this medication, and follow the directions you see here.   pantoprazole 40 MG tablet Commonly known as: PROTONIX Take 40 mg by mouth 2 (two) times daily before a meal.   Vagisil Deodorant Powd Apply 1 application topically daily as needed (for skin irritation).       Allergies  Allergen Reactions  . Shellfish Allergy Anaphylaxis  . Lithium Nausea And Vomiting  . Excedrin Extra Strength [Aspirin-Acetaminophen-Caffeine] Other (See Comments)    Makes her nervous.  Bobbye Charleston [Clonazepam] Other (See Comments)    hallucinations    Consultations:  GI   Procedures/Studies: MR ANGIO HEAD WO CONTRAST  Result Date: 10/23/2019 CLINICAL DATA:  Focal neuro deficit.  Fall yesterday. EXAM: MRA HEAD WITHOUT CONTRAST TECHNIQUE: Angiographic images of the Circle of Willis were obtained using MRA technique without intravenous contrast. COMPARISON:  CTA head and neck 08/09/2015 FINDINGS: Both vertebral arteries patent to the basilar. PICA patent bilaterally. Basilar widely patent. Posterior cerebral arteries widely patent bilaterally. Internal carotid artery widely patent bilaterally. Hypoplastic left A1 segment. Both anterior cerebral ups are  patent and supplied from the right. Middle cerebral arteries patent bilaterally without stenosis or large vessel occlusion Negative for cerebral aneurysm. IMPRESSION:  Negative MRA head Electronically Signed   By: Franchot Gallo M.D.   On: 10/23/2019 20:31   MR ANGIO NECK WO CONTRAST  Result Date: 10/23/2019 CLINICAL DATA:  Neuro deficit EXAM: MRA NECK WITHOUT CONTRAST TECHNIQUE: Angiographic images of the neck were obtained using MRA technique without intravenous contrast. Carotid stenosis measurements (when applicable) are obtained utilizing NASCET criteria, using the distal internal carotid diameter as the denominator. COMPARISON:  CT a head and neck 08/09/2015. FINDINGS: MRA NECK FINDINGS Both vertebral arteries are patent with antegrade flow. Moderate stenosis proximal left vertebral artery. Left vertebral artery was widely patent on prior CTA raising the possibility of artifact on today's study. Right vertebral artery widely patent Carotid bifurcation widely patent bilaterally without stenosis or irregularity IMPRESSION: Both carotid arteries widely patent. Moderate stenosis proximal left vertebral artery. Electronically Signed   By: Franchot Gallo M.D.   On: 10/23/2019 20:33   MR BRAIN WO CONTRAST  Result Date: 10/23/2019 CLINICAL DATA:  Focal neuro deficit.  Fall yesterday EXAM: MRI HEAD WITHOUT CONTRAST TECHNIQUE: Multiplanar, multiecho pulse sequences of the brain and surrounding structures were obtained without intravenous contrast. COMPARISON:  CT head today. FINDINGS: Brain: Negative for acute infarct. Mild atrophy without hydrocephalus. Negative for hemorrhage mass or fluid collection. Vascular: Normal arterial flow voids. Skull and upper cervical spine: No focal skeletal lesion. Sinuses/Orbits: Negative Other: None IMPRESSION: Generalized atrophy without acute abnormality. Electronically Signed   By: Franchot Gallo M.D.   On: 10/23/2019 20:24   US RENAL  Result Date: 10/24/2019 CLINICAL DATA:  Acute kidney injury. EXAM: RENAL / URINARY TRACT ULTRASOUND COMPLETE COMPARISON:  CT abdomen and pelvis 06/24/2019. FINDINGS: Right Kidney: Renal measurements: 9.9  x 4.4 x 5.8 cm = volume: 132.5 mL. Echogenicity within is increased. No mass or hydronephrosis visualized. Left Kidney: Renal measurements: 10.8 x 5.8 x 5.3 cm = volume: 173.4 mL. Echogenicity within is increased. No mass or hydronephrosis visualized. Bladder: Appears normal for degree of bladder distention. Other: None. IMPRESSION: Increased cortical echogenicity of both kidneys compatible with medical renal disease. Negative for hydronephrosis or acute abnormality. Electronically Signed   By: Inge Rise M.D.   On: 10/24/2019 10:56   DG CHEST PORT 1 VIEW  Result Date: 10/23/2019 CLINICAL DATA:  Altered mental status and fever, history of diabetes and hypertension EXAM: PORTABLE CHEST 1 VIEW COMPARISON:  Radiograph 10/06/2016 FINDINGS: No consolidation, features of edema, pneumothorax, or effusion. The cardiomediastinal contours are unremarkable. No acute osseous or soft tissue abnormality. Redemonstrated dextrocurvature of the thoracic spine. Prior cervical spinal fusion hardware is again noted. Degenerative changes present in the shoulders and spine. Cholecystectomy clips in the right upper quadrant. Telemetry leads overlie the chest. IMPRESSION: No acute cardiopulmonary abnormality. Electronically Signed   By: Lovena Le M.D.   On: 10/23/2019 22:55   EEG adult  Result Date: 10/25/2019 Lora Havens, MD     10/25/2019  9:53 AM Patient Name: Linda English MRN: [id number] Epilepsy Attending: Lora Havens Referring Physician/Provider: Dr. Shanon Brow Adren Dollins Date: 10/24/2019 Duration: 24.31 minutes Patient history: 64 year old female with history of subdural hematoma presented with altered mental status.  EEG to evaluate for seizures. Level of alertness: Awake AEDs during EEG study: Lamotrigine Technical aspects: This EEG study was done with scalp electrodes positioned according to the 10-20 International system of electrode placement. Electrical activity was acquired at a sampling  rate of 500Hz   and reviewed with a high frequency filter of 70Hz  and a low frequency filter of 1Hz . EEG data were recorded continuously and digitally stored. Description: The posterior dominant rhythm consists of 7.5-8 Hz activity of moderate voltage (25-35 uV) seen predominantly in posterior head regions, symmetric and reactive to eye opening and eye closing.  EEG also showed continuous generalized 3 to 6 Hz theta-delta slowing. Hyperventilation and photic stimulation were not performed. Abnormality -Continuous slow, generalized IMPRESSION: This study is suggestive of mild diffuse encephalopathy, nonspecific to etiology. No seizures or epileptiform discharges were seen throughout the recording. Lora Havens   ECHOCARDIOGRAM COMPLETE  Result Date: 10/25/2019    ECHOCARDIOGRAM REPORT   Patient Name:   DEBORAHA OTWELL [place] Date of Exam: 10/25/2019 Medical Rec #:  [id number]              Height:       61.0 in Accession #:    [id number]             Weight:       101.0 lb Date of Birth:  November 14, 1955             BSA:          1.413 m Patient Age:    26 years               BP:           129/71 mmHg Patient Gender: F                      HR:           117 bpm. Exam Location:  [facility] Procedure: 2D Echo, Cardiac Doppler and Color Doppler Indications:    SVT (supraventricular tachycardia)  History:        Patient has prior history of Echocardiogram examinations, most                 recent 08/10/2015. Risk Factors:Hypertension, Diabetes and                 Dyslipidemia. Acute renal failure superimposed on stage 3                 chronic kidney disease,Bipolar 1 disorder,OBSTRUCTIVE SLEEP                 APNEA.  Sonographer:    Alvino Chapel RCS Referring Phys: [phone] Iridian Reader IMPRESSIONS  1. Left ventricular ejection fraction, by estimation, is 55 to 60%. The left ventricle has normal function. Left ventricular endocardial border not optimally defined to evaluate regional wall motion. Left ventricular diastolic parameters are  indeterminate.  2. Right ventricular systolic function is normal. The right ventricular size is normal. There is normal pulmonary artery systolic pressure.  3. The mitral valve is normal in structure and function. Mild mitral valve regurgitation. No evidence of mitral stenosis.  4. The aortic valve is tricuspid. Aortic valve regurgitation is moderate. FINDINGS  Left Ventricle: Left ventricular ejection fraction, by estimation, is 55 to 60%. The left ventricle has normal function. Left ventricular endocardial border not optimally defined to evaluate regional wall motion. The left ventricular internal cavity size was normal in size. There is no left ventricular hypertrophy. Left ventricular diastolic parameters are indeterminate. Right Ventricle: The right ventricular size is normal. No increase in right ventricular wall thickness. Right ventricular systolic function is normal. There is normal pulmonary artery systolic pressure. The tricuspid regurgitant velocity is 2.27  m/s, and  with an assumed right atrial pressure of 3 mmHg, the estimated right ventricular systolic pressure is 123456 mmHg. Left Atrium: Left atrial size was normal in size. Right Atrium: Right atrial size was normal in size. Pericardium: There is no evidence of pericardial effusion. Mitral Valve: The mitral valve is normal in structure and function. Mild mitral valve regurgitation. No evidence of mitral valve stenosis. Tricuspid Valve: The tricuspid valve is normal in structure. Tricuspid valve regurgitation is trivial. No evidence of tricuspid stenosis. Aortic Valve: The aortic valve is tricuspid. Aortic valve regurgitation is moderate. Aortic regurgitation PHT measures 212 msec. Aortic valve mean gradient measures 4.7 mmHg. Aortic valve peak gradient measures 8.6 mmHg. Aortic valve area, by VTI measures 1.66 cm. Pulmonic Valve: The pulmonic valve was not well visualized. Pulmonic valve regurgitation is not visualized. No evidence of pulmonic  stenosis. Aorta: The aortic root is normal in size and structure. Pulmonary Artery: Indeterminant PASP, inadequate TR jet. IAS/Shunts: No atrial level shunt detected by color flow Doppler.  LEFT VENTRICLE PLAX 2D LVIDd:         3.48 cm     Diastology LVIDs:         2.20 cm     LV e' lateral:   14.10 cm/s LV PW:         0.80 cm     LV E/e' lateral: 7.9 LV IVS:        0.84 cm     LV e' medial:    4.79 cm/s LVOT diam:     1.80 cm     LV E/e' medial:  23.4 LV SV:         46 LV SV Index:   33 LVOT Area:     2.54 cm  LV Volumes (MOD) LV vol d, MOD A2C: 50.9 ml LV vol d, MOD A4C: 41.4 ml LV vol s, MOD A2C: 22.7 ml LV vol s, MOD A4C: 16.5 ml LV SV MOD A2C:     28.2 ml LV SV MOD A4C:     41.4 ml LV SV MOD BP:      28.5 ml RIGHT VENTRICLE RV S prime:     9.95 cm/s TAPSE (M-mode): 1.8 cm LEFT ATRIUM           Index       RIGHT ATRIUM          Index LA diam:      3.20 cm 2.27 cm/m  RA Area:     9.20 cm LA Vol (A2C): 29.0 ml 20.53 ml/m RA Volume:   18.50 ml 13.09 ml/m LA Vol (A4C): 31.5 ml 22.30 ml/m  AORTIC VALVE AV Area (Vmax):    1.65 cm AV Area (Vmean):   1.66 cm AV Area (VTI):     1.66 cm AV Vmax:           146.61 cm/s AV Vmean:          102.436 cm/s AV VTI:            0.278 m AV Peak Grad:      8.6 mmHg AV Mean Grad:      4.7 mmHg LVOT Vmax:         95.30 cm/s LVOT Vmean:        67.000 cm/s LVOT VTI:          0.181 m LVOT/AV VTI ratio: 0.65 AI PHT:            212 msec  AORTA  Ao Root diam: 3.00 cm MITRAL VALVE                TRICUSPID VALVE MV Area (PHT): 4.80 cm     TR Peak grad:   20.6 mmHg MV Decel Time: 158 msec     TR Vmax:        227.00 cm/s MV E velocity: 112.00 cm/s                             SHUNTS                             Systemic VTI:  0.18 m                             Systemic Diam: 1.80 cm Carlyle Dolly MD Electronically signed by Carlyle Dolly MD Signature Date/Time: 10/25/2019/3:33:45 PM    Final    CT HEAD CODE STROKE WO CONTRAST  Result Date: 10/23/2019 CLINICAL DATA:  Code stroke.   Left-sided weakness EXAM: CT HEAD WITHOUT CONTRAST TECHNIQUE: Contiguous axial images were obtained from the base of the skull through the vertex without intravenous contrast. COMPARISON:  08/12/2019 FINDINGS: Brain: There is no acute intracranial hemorrhage, mass effect, or edema. Gray-white differentiation remains preserved. Minimal patchy hypoattenuation in the supratentorial white matter is nonspecific but may reflect minor chronic microvascular ischemic changes. Ventricles and sulci are stable in size and configuration. Vascular: No hyperdense vessel. There is intracranial atherosclerotic calcification at the skull base. Skull: Unremarkable. Sinuses/Orbits: The imaged paranasal sinuses are clear. Visualized orbits are unremarkable. Other: Mastoid air cells are clear. ASPECTS ([place] Stroke Program Early CT Score) - Ganglionic level infarction (caudate, lentiform nuclei, internal capsule, insula, M1-M3 cortex): 7 - Supraganglionic infarction (M4-M6 cortex): 3 Total score (0-10 with 10 being normal): 10 IMPRESSION: No acute intracranial hemorrhage or evidence of acute infarction. ASPECT score is 10. These results were called by telephone at the time of interpretation on 10/23/2019 at 3:03 pm to provider [facility] , who verbally acknowledged these results. Electronically Signed   By: Macy Mis M.D.   On: 10/23/2019 15:07         Discharge Exam: Vitals:   10/26/19 0600 10/26/19 0921  BP: 130/82 (!) 113/96  Pulse: (!) 115 (!) 105  Resp: 14   Temp: 98.5 F (36.9 C)   SpO2: 96%    Vitals:   10/25/19 2114 10/25/19 2233 10/26/19 0600 10/26/19 0921  BP: (!) 150/76  130/82 (!) 113/96  Pulse: (!) 109 (!) 130 (!) 115 (!) 105  Resp: 16  14   Temp: 99 F (37.2 C)  98.5 F (36.9 C)   TempSrc:   Oral   SpO2: 98%  96%   Weight:      Height:        General: Pt is alert, awake, not in acute distress Cardiovascular: RRR, S1/S2 +, no rubs, no gallops Respiratory: bibasilar crackles. No  wheeze Abdominal: Soft, NT, ND, bowel sounds + Extremities: no edema, no cyanosis   The results of significant diagnostics from this hospitalization (including imaging, microbiology, ancillary and laboratory) are listed below for reference.    Significant Diagnostic Studies: MR ANGIO HEAD WO CONTRAST  Result Date: 10/23/2019 CLINICAL DATA:  Focal neuro deficit.  Fall yesterday. EXAM: MRA HEAD WITHOUT CONTRAST TECHNIQUE: Angiographic images of the Circle of Willis were  obtained using MRA technique without intravenous contrast. COMPARISON:  CTA head and neck 08/09/2015 FINDINGS: Both vertebral arteries patent to the basilar. PICA patent bilaterally. Basilar widely patent. Posterior cerebral arteries widely patent bilaterally. Internal carotid artery widely patent bilaterally. Hypoplastic left A1 segment. Both anterior cerebral ups are patent and supplied from the right. Middle cerebral arteries patent bilaterally without stenosis or large vessel occlusion Negative for cerebral aneurysm. IMPRESSION: Negative MRA head Electronically Signed   By: Franchot Gallo M.D.   On: 10/23/2019 20:31   MR ANGIO NECK WO CONTRAST  Result Date: 10/23/2019 CLINICAL DATA:  Neuro deficit EXAM: MRA NECK WITHOUT CONTRAST TECHNIQUE: Angiographic images of the neck were obtained using MRA technique without intravenous contrast. Carotid stenosis measurements (when applicable) are obtained utilizing NASCET criteria, using the distal internal carotid diameter as the denominator. COMPARISON:  CT a head and neck 08/09/2015. FINDINGS: MRA NECK FINDINGS Both vertebral arteries are patent with antegrade flow. Moderate stenosis proximal left vertebral artery. Left vertebral artery was widely patent on prior CTA raising the possibility of artifact on today's study. Right vertebral artery widely patent Carotid bifurcation widely patent bilaterally without stenosis or irregularity IMPRESSION: Both carotid arteries widely patent. Moderate  stenosis proximal left vertebral artery. Electronically Signed   By: Franchot Gallo M.D.   On: 10/23/2019 20:33   MR BRAIN WO CONTRAST  Result Date: 10/23/2019 CLINICAL DATA:  Focal neuro deficit.  Fall yesterday EXAM: MRI HEAD WITHOUT CONTRAST TECHNIQUE: Multiplanar, multiecho pulse sequences of the brain and surrounding structures were obtained without intravenous contrast. COMPARISON:  CT head today. FINDINGS: Brain: Negative for acute infarct. Mild atrophy without hydrocephalus. Negative for hemorrhage mass or fluid collection. Vascular: Normal arterial flow voids. Skull and upper cervical spine: No focal skeletal lesion. Sinuses/Orbits: Negative Other: None IMPRESSION: Generalized atrophy without acute abnormality. Electronically Signed   By: Franchot Gallo M.D.   On: 10/23/2019 20:24   US RENAL  Result Date: 10/24/2019 CLINICAL DATA:  Acute kidney injury. EXAM: RENAL / URINARY TRACT ULTRASOUND COMPLETE COMPARISON:  CT abdomen and pelvis 06/24/2019. FINDINGS: Right Kidney: Renal measurements: 9.9 x 4.4 x 5.8 cm = volume: 132.5 mL. Echogenicity within is increased. No mass or hydronephrosis visualized. Left Kidney: Renal measurements: 10.8 x 5.8 x 5.3 cm = volume: 173.4 mL. Echogenicity within is increased. No mass or hydronephrosis visualized. Bladder: Appears normal for degree of bladder distention. Other: None. IMPRESSION: Increased cortical echogenicity of both kidneys compatible with medical renal disease. Negative for hydronephrosis or acute abnormality. Electronically Signed   By: Inge Rise M.D.   On: 10/24/2019 10:56   DG CHEST PORT 1 VIEW  Result Date: 10/23/2019 CLINICAL DATA:  Altered mental status and fever, history of diabetes and hypertension EXAM: PORTABLE CHEST 1 VIEW COMPARISON:  Radiograph 10/06/2016 FINDINGS: No consolidation, features of edema, pneumothorax, or effusion. The cardiomediastinal contours are unremarkable. No acute osseous or soft tissue abnormality.  Redemonstrated dextrocurvature of the thoracic spine. Prior cervical spinal fusion hardware is again noted. Degenerative changes present in the shoulders and spine. Cholecystectomy clips in the right upper quadrant. Telemetry leads overlie the chest. IMPRESSION: No acute cardiopulmonary abnormality. Electronically Signed   By: Lovena Le M.D.   On: 10/23/2019 22:55   EEG adult  Result Date: 10/25/2019 Lora Havens, MD     10/25/2019  9:53 AM Patient Name: Linda English MRN: [id number] Epilepsy Attending: Lora Havens Referring Physician/Provider: Dr. Shanon Brow Voula Waln Date: 10/24/2019 Duration: 24.31 minutes Patient history: 64 year old female with history of  subdural hematoma presented with altered mental status.  EEG to evaluate for seizures. Level of alertness: Awake AEDs during EEG study: Lamotrigine Technical aspects: This EEG study was done with scalp electrodes positioned according to the 10-20 International system of electrode placement. Electrical activity was acquired at a sampling rate of 500Hz  and reviewed with a high frequency filter of 70Hz  and a low frequency filter of 1Hz . EEG data were recorded continuously and digitally stored. Description: The posterior dominant rhythm consists of 7.5-8 Hz activity of moderate voltage (25-35 uV) seen predominantly in posterior head regions, symmetric and reactive to eye opening and eye closing.  EEG also showed continuous generalized 3 to 6 Hz theta-delta slowing. Hyperventilation and photic stimulation were not performed. Abnormality -Continuous slow, generalized IMPRESSION: This study is suggestive of mild diffuse encephalopathy, nonspecific to etiology. No seizures or epileptiform discharges were seen throughout the recording. Lora Havens   ECHOCARDIOGRAM COMPLETE  Result Date: 10/25/2019    ECHOCARDIOGRAM REPORT   Patient Name:   Linda English Date of Exam: 10/25/2019 Medical Rec #:  [id number]              Height:       61.0 in  Accession #:    [id number]             Weight:       101.0 lb Date of Birth:  01-Feb-1956             BSA:          1.413 m Patient Age:    76 years               BP:           129/71 mmHg Patient Gender: F                      HR:           117 bpm. Exam Location:  [facility] Procedure: 2D Echo, Cardiac Doppler and Color Doppler Indications:    SVT (supraventricular tachycardia)  History:        Patient has prior history of Echocardiogram examinations, most                 recent 08/10/2015. Risk Factors:Hypertension, Diabetes and                 Dyslipidemia. Acute renal failure superimposed on stage 3                 chronic kidney disease,Bipolar 1 disorder,OBSTRUCTIVE SLEEP                 APNEA.  Sonographer:    Alvino Chapel RCS Referring Phys: [phone] Kaspian Muccio IMPRESSIONS  1. Left ventricular ejection fraction, by estimation, is 55 to 60%. The left ventricle has normal function. Left ventricular endocardial border not optimally defined to evaluate regional wall motion. Left ventricular diastolic parameters are indeterminate.  2. Right ventricular systolic function is normal. The right ventricular size is normal. There is normal pulmonary artery systolic pressure.  3. The mitral valve is normal in structure and function. Mild mitral valve regurgitation. No evidence of mitral stenosis.  4. The aortic valve is tricuspid. Aortic valve regurgitation is moderate. FINDINGS  Left Ventricle: Left ventricular ejection fraction, by estimation, is 55 to 60%. The left ventricle has normal function. Left ventricular endocardial border not optimally defined to evaluate regional wall motion. The left ventricular internal cavity size was normal  in size. There is no left ventricular hypertrophy. Left ventricular diastolic parameters are indeterminate. Right Ventricle: The right ventricular size is normal. No increase in right ventricular wall thickness. Right ventricular systolic function is normal. There is normal pulmonary  artery systolic pressure. The tricuspid regurgitant velocity is 2.27 m/s, and  with an assumed right atrial pressure of 3 mmHg, the estimated right ventricular systolic pressure is 123456 mmHg. Left Atrium: Left atrial size was normal in size. Right Atrium: Right atrial size was normal in size. Pericardium: There is no evidence of pericardial effusion. Mitral Valve: The mitral valve is normal in structure and function. Mild mitral valve regurgitation. No evidence of mitral valve stenosis. Tricuspid Valve: The tricuspid valve is normal in structure. Tricuspid valve regurgitation is trivial. No evidence of tricuspid stenosis. Aortic Valve: The aortic valve is tricuspid. Aortic valve regurgitation is moderate. Aortic regurgitation PHT measures 212 msec. Aortic valve mean gradient measures 4.7 mmHg. Aortic valve peak gradient measures 8.6 mmHg. Aortic valve area, by VTI measures 1.66 cm. Pulmonic Valve: The pulmonic valve was not well visualized. Pulmonic valve regurgitation is not visualized. No evidence of pulmonic stenosis. Aorta: The aortic root is normal in size and structure. Pulmonary Artery: Indeterminant PASP, inadequate TR jet. IAS/Shunts: No atrial level shunt detected by color flow Doppler.  LEFT VENTRICLE PLAX 2D LVIDd:         3.48 cm     Diastology LVIDs:         2.20 cm     LV e' lateral:   14.10 cm/s LV PW:         0.80 cm     LV E/e' lateral: 7.9 LV IVS:        0.84 cm     LV e' medial:    4.79 cm/s LVOT diam:     1.80 cm     LV E/e' medial:  23.4 LV SV:         46 LV SV Index:   33 LVOT Area:     2.54 cm  LV Volumes (MOD) LV vol d, MOD A2C: 50.9 ml LV vol d, MOD A4C: 41.4 ml LV vol s, MOD A2C: 22.7 ml LV vol s, MOD A4C: 16.5 ml LV SV MOD A2C:     28.2 ml LV SV MOD A4C:     41.4 ml LV SV MOD BP:      28.5 ml RIGHT VENTRICLE RV S prime:     9.95 cm/s TAPSE (M-mode): 1.8 cm LEFT ATRIUM           Index       RIGHT ATRIUM          Index LA diam:      3.20 cm 2.27 cm/m  RA Area:     9.20 cm LA Vol (A2C):  29.0 ml 20.53 ml/m RA Volume:   18.50 ml 13.09 ml/m LA Vol (A4C): 31.5 ml 22.30 ml/m  AORTIC VALVE AV Area (Vmax):    1.65 cm AV Area (Vmean):   1.66 cm AV Area (VTI):     1.66 cm AV Vmax:           146.61 cm/s AV Vmean:          102.436 cm/s AV VTI:            0.278 m AV Peak Grad:      8.6 mmHg AV Mean Grad:      4.7 mmHg LVOT Vmax:  95.30 cm/s LVOT Vmean:        67.000 cm/s LVOT VTI:          0.181 m LVOT/AV VTI ratio: 0.65 AI PHT:            212 msec  AORTA Ao Root diam: 3.00 cm MITRAL VALVE                TRICUSPID VALVE MV Area (PHT): 4.80 cm     TR Peak grad:   20.6 mmHg MV Decel Time: 158 msec     TR Vmax:        227.00 cm/s MV E velocity: 112.00 cm/s                             SHUNTS                             Systemic VTI:  0.18 m                             Systemic Diam: 1.80 cm Carlyle Dolly MD Electronically signed by Carlyle Dolly MD Signature Date/Time: 10/25/2019/3:33:45 PM    Final    CT HEAD CODE STROKE WO CONTRAST  Result Date: 10/23/2019 CLINICAL DATA:  Code stroke.  Left-sided weakness EXAM: CT HEAD WITHOUT CONTRAST TECHNIQUE: Contiguous axial images were obtained from the base of the skull through the vertex without intravenous contrast. COMPARISON:  08/12/2019 FINDINGS: Brain: There is no acute intracranial hemorrhage, mass effect, or edema. Gray-white differentiation remains preserved. Minimal patchy hypoattenuation in the supratentorial white matter is nonspecific but may reflect minor chronic microvascular ischemic changes. Ventricles and sulci are stable in size and configuration. Vascular: No hyperdense vessel. There is intracranial atherosclerotic calcification at the skull base. Skull: Unremarkable. Sinuses/Orbits: The imaged paranasal sinuses are clear. Visualized orbits are unremarkable. Other: Mastoid air cells are clear. ASPECTS ([place] Stroke Program Early CT Score) - Ganglionic level infarction (caudate, lentiform nuclei, internal capsule, insula, M1-M3  cortex): 7 - Supraganglionic infarction (M4-M6 cortex): 3 Total score (0-10 with 10 being normal): 10 IMPRESSION: No acute intracranial hemorrhage or evidence of acute infarction. ASPECT score is 10. These results were called by telephone at the time of interpretation on 10/23/2019 at 3:03 pm to provider [facility] , who verbally acknowledged these results. Electronically Signed   By: Macy Mis M.D.   On: 10/23/2019 15:07     Microbiology: Recent Results (from the past 240 hour(s))  SARS CORONAVIRUS 2 (Kalayah Leske 6-24 HRS) Nasopharyngeal Nasopharyngeal Swab     Status: None   Collection Time: 10/23/19  5:57 PM   Specimen: Nasopharyngeal Swab  Result Value Ref Range Status   SARS Coronavirus 2 NEGATIVE NEGATIVE Final    Comment: (NOTE) SARS-CoV-2 target nucleic acids are NOT DETECTED. The SARS-CoV-2 RNA is generally detectable in upper and lower respiratory specimens during the acute phase of infection. Negative results do not preclude SARS-CoV-2 infection, do not rule out co-infections with other pathogens, and should not be used as the sole basis for treatment or other patient management decisions. Negative results must be combined with clinical observations, patient history, and epidemiological information. The expected result is Negative. Fact Sheet for Patients: SugarRoll.be Fact Sheet for Healthcare Providers: https://www.woods-mathews.com/ This test is not yet approved or cleared by the [place] FDA and  has been authorized for detection and/or  diagnosis of SARS-CoV-2 by FDA under an Emergency Use Authorization (EUA). This EUA will remain  in effect (meaning this test can be used) for the duration of the COVID-19 declaration under Section 56 4(b)(1) of the Act, 21 U.S.C. section 360bbb-3(b)(1), unless the authorization is terminated or revoked sooner. Performed at [facility], [place] [address] [place], [place] [address]    Culture, blood (routine x 2)     Status: None (Preliminary result)   Collection Time: 10/23/19 11:02 PM   Specimen: BLOOD  Result Value Ref Range Status   Specimen Description BLOOD RIGHT ANTECUBITAL  Final   Special Requests   Final    BOTTLES DRAWN AEROBIC AND ANAEROBIC Blood Culture adequate volume   Culture   Final    NO GROWTH 3 DAYS Performed at [facility], [address] [place], [place] [address]    Report Status PENDING  Incomplete  Culture, blood (routine x 2)     Status: None (Preliminary result)   Collection Time: 10/23/19 11:08 PM   Specimen: BLOOD  Result Value Ref Range Status   Specimen Description BLOOD BLOOD LEFT WRIST  Final   Special Requests   Final    BOTTLES DRAWN AEROBIC AND ANAEROBIC Blood Culture adequate volume   Culture   Final    NO GROWTH 3 DAYS Performed at [facility], [address], [place], [place] [address]    Report Status PENDING  Incomplete     Labs: Basic Metabolic Panel: Recent Labs  Lab 10/23/19 1459 10/23/19 1459 10/23/19 1540 10/23/19 1540 10/23/19 2303 10/24/19 0516 10/24/19 0516 10/25/19 1034 10/26/19 0518  NA 140  --  141  --   --  141  --  133* 131*  K 3.3*   < > 3.2*   < >  --  3.7   < > 3.5 3.7  CL 98  --  101  --   --  102  --  97* 94*  CO2 26  --   --   --   --  26  --  21* 25  GLUCOSE 131*  --  124*  --   --  108*  --  130* 163*  BUN 59*  --  49*  --   --  60*  --  21 19  CREATININE 3.26*  --  3.40*  --   --  3.25*  --  1.23* 1.06*  CALCIUM 9.8  --   --   --   --  9.3  --  8.8* 9.3  MG  --   --   --   --  1.8  --   --  1.1* 1.5*   < > = values in this interval not displayed.   Liver Function Tests: Recent Labs  Lab 10/23/19 1459  AST 20  ALT 12  ALKPHOS 90  BILITOT 0.5  PROT 7.8  ALBUMIN 4.6   No results for input(s): LIPASE, AMYLASE in the last 168 hours. Recent Labs  Lab 10/24/19 0935  AMMONIA 21   CBC: Recent Labs  Lab 10/23/19 1459 10/23/19 1540 10/25/19 1034  WBC 9.0  --  6.6   NEUTROABS 6.2  --   --   HGB 12.3 12.9 11.8*  HCT 38.2 38.0 36.5  MCV 85.1  --  83.5  PLT 314  --  297   Cardiac Enzymes: No results for input(s): CKTOTAL, CKMB, CKMBINDEX, TROPONINI in the last 168 hours. BNP: Invalid input(s): POCBNP CBG: Recent Labs  Lab 10/25/19 1135 10/25/19 1649 10/25/19 2115 10/26/19 0726 10/26/19 1119  GLUCAP 183* 118* 235* 132* 144*    Time coordinating discharge:  36 minutes  Signed:  Orson Eva, DO [facility] Pager: [phone] 10/26/2019, 11:50 AM

## 2019-10-26 NOTE — Plan of Care (Signed)
  Problem: Education: Goal: Knowledge of General Education information will improve Description: Including pain rating scale, medication(s)/side effects and non-pharmacologic comfort measures Outcome: Adequate for Discharge   Problem: Health Behavior/Discharge Planning: Goal: Ability to manage health-related needs will improve Outcome: Adequate for Discharge   Problem: Clinical Measurements: Goal: Ability to maintain clinical measurements within normal limits will improve Outcome: Adequate for Discharge Goal: Will remain free from infection Outcome: Adequate for Discharge Goal: Diagnostic test results will improve Outcome: Adequate for Discharge Goal: Respiratory complications will improve Outcome: Adequate for Discharge Goal: Cardiovascular complication will be avoided Outcome: Adequate for Discharge   Problem: Activity: Goal: Risk for activity intolerance will decrease Outcome: Adequate for Discharge   Problem: Nutrition: Goal: Adequate nutrition will be maintained Outcome: Adequate for Discharge   Problem: Coping: Goal: Level of anxiety will decrease Outcome: Adequate for Discharge   Problem: Elimination: Goal: Will not experience complications related to bowel motility Outcome: Adequate for Discharge Goal: Will not experience complications related to urinary retention Outcome: Adequate for Discharge   Problem: Pain Managment: Goal: General experience of comfort will improve Outcome: Adequate for Discharge   Problem: Safety: Goal: Ability to remain free from injury will improve Outcome: Adequate for Discharge   Problem: Skin Integrity: Goal: Risk for impaired skin integrity will decrease Outcome: Adequate for Discharge   Problem: Education: Goal: Knowledge of disease or condition will improve Outcome: Adequate for Discharge Goal: Knowledge of secondary prevention will improve Outcome: Adequate for Discharge Goal: Knowledge of patient specific risk factors  addressed and post discharge goals established will improve Outcome: Adequate for Discharge

## 2019-10-26 NOTE — Telephone Encounter (Signed)
Please schedule patient for hospital followup, schedule TCS in 4-8 weeks with any APP

## 2019-10-29 DIAGNOSIS — G20A1 Parkinson's disease without dyskinesia, without mention of fluctuations: Secondary | ICD-10-CM | POA: Insufficient documentation

## 2019-10-29 LAB — CULTURE, BLOOD (ROUTINE X 2)
Culture: NO GROWTH
Culture: NO GROWTH
Special Requests: ADEQUATE
Special Requests: ADEQUATE

## 2019-10-30 NOTE — Telephone Encounter (Signed)
PATIENT SCHEDULED  °

## 2019-10-31 DIAGNOSIS — G9340 Encephalopathy, unspecified: Secondary | ICD-10-CM | POA: Diagnosis not present

## 2019-10-31 DIAGNOSIS — E43 Unspecified severe protein-calorie malnutrition: Secondary | ICD-10-CM | POA: Diagnosis not present

## 2019-10-31 DIAGNOSIS — K589 Irritable bowel syndrome without diarrhea: Secondary | ICD-10-CM | POA: Diagnosis not present

## 2019-10-31 DIAGNOSIS — E1122 Type 2 diabetes mellitus with diabetic chronic kidney disease: Secondary | ICD-10-CM | POA: Diagnosis not present

## 2019-10-31 DIAGNOSIS — Z7984 Long term (current) use of oral hypoglycemic drugs: Secondary | ICD-10-CM | POA: Diagnosis not present

## 2019-10-31 DIAGNOSIS — G4730 Sleep apnea, unspecified: Secondary | ICD-10-CM | POA: Diagnosis not present

## 2019-10-31 DIAGNOSIS — Z9181 History of falling: Secondary | ICD-10-CM | POA: Diagnosis not present

## 2019-10-31 DIAGNOSIS — S065X0D Traumatic subdural hemorrhage without loss of consciousness, subsequent encounter: Secondary | ICD-10-CM | POA: Diagnosis not present

## 2019-10-31 DIAGNOSIS — G20 Parkinson's disease: Secondary | ICD-10-CM | POA: Diagnosis not present

## 2019-10-31 DIAGNOSIS — K219 Gastro-esophageal reflux disease without esophagitis: Secondary | ICD-10-CM | POA: Diagnosis not present

## 2019-10-31 DIAGNOSIS — N1831 Chronic kidney disease, stage 3a: Secondary | ICD-10-CM | POA: Diagnosis not present

## 2019-10-31 DIAGNOSIS — M158 Other polyosteoarthritis: Secondary | ICD-10-CM | POA: Diagnosis not present

## 2019-10-31 DIAGNOSIS — Z85048 Personal history of other malignant neoplasm of rectum, rectosigmoid junction, and anus: Secondary | ICD-10-CM | POA: Diagnosis not present

## 2019-10-31 DIAGNOSIS — I129 Hypertensive chronic kidney disease with stage 1 through stage 4 chronic kidney disease, or unspecified chronic kidney disease: Secondary | ICD-10-CM | POA: Diagnosis not present

## 2019-11-05 ENCOUNTER — Telehealth: Payer: Self-pay | Admitting: Internal Medicine

## 2019-11-05 MED ORDER — DRONABINOL 2.5 MG PO CAPS: 2.5000 mg | ORAL_CAPSULE | Freq: Two times a day (BID) | ORAL | 3 refills | Status: DC

## 2019-11-05 NOTE — Telephone Encounter (Signed)
Pt's daughter called to say that the patient will need a prior authorization for her Zofran. She uses [facility] on [address]. [phone]

## 2019-11-05 NOTE — Telephone Encounter (Signed)
Lmom, waiting on a return call. PA's done this year have been denied due to pt not being on Chemo. [place] coupons were previously given. Will discuss if that is ok to give pt again.

## 2019-11-05 NOTE — Addendum Note (Signed)
Addended by: Annitta Needs on: 11/05/2019 11:34 AM   Modules accepted: Orders

## 2019-11-05 NOTE — Telephone Encounter (Signed)
Spoke with pts daughter. Pts Zofran is written for 90 tabs for the month. Pt is only getting 18 pills at a time and it doesn't last a month. I called [facility] on [address] and was told they give pt 90 pills in her bottles and if pt wants more pills, she'll need to pay out of pocket.   Spoke with pts daughter again and she says pt is only getting 18 pills. Pts Pas have previously been denied by the insurance company due to pt not taking Chemo Therapy. Pts daughter wants to know if another nausea medication be called in to use with Zofran or on the days she runs out of Zofran. If new medication can be sent in, they would like RX sent to [facility], so GoodRx coupons can be used.

## 2019-11-05 NOTE — Telephone Encounter (Signed)
I have printed Marinol prescription. Can try this twice a day before meals.

## 2019-11-05 NOTE — Telephone Encounter (Signed)
Noted. Spoke with pts daughter and faxed RX to [facility]. With [place] coupon med will be less expensive there. Pt may require a PA.

## 2019-11-07 ENCOUNTER — Telehealth: Payer: Self-pay | Admitting: Internal Medicine

## 2019-11-07 NOTE — Telephone Encounter (Signed)
Spoke with pts daughter. [facility] [address] doesn't have pts Marinol RX. Verbal was given for Marinol RX. Left a detailed message for pts daughter. [facility] will only allow pt to pick up 19 pills of her Zofran due to pts insurance will only cover a 19 pill supply for the month. If pt uses a GoodRx coupon, she can get 90 pills for $38.00 as discussed before at [employer].

## 2019-11-07 NOTE — Telephone Encounter (Signed)
Pt's daughter was following up on patient's prescription of Marinol. She uses [facility] on [address].

## 2019-11-08 ENCOUNTER — Telehealth: Payer: Self-pay | Admitting: Internal Medicine

## 2019-11-08 DIAGNOSIS — Z9181 History of falling: Secondary | ICD-10-CM | POA: Diagnosis not present

## 2019-11-08 DIAGNOSIS — N1831 Chronic kidney disease, stage 3a: Secondary | ICD-10-CM | POA: Diagnosis not present

## 2019-11-08 DIAGNOSIS — I129 Hypertensive chronic kidney disease with stage 1 through stage 4 chronic kidney disease, or unspecified chronic kidney disease: Secondary | ICD-10-CM | POA: Diagnosis not present

## 2019-11-08 DIAGNOSIS — G4730 Sleep apnea, unspecified: Secondary | ICD-10-CM | POA: Diagnosis not present

## 2019-11-08 DIAGNOSIS — K219 Gastro-esophageal reflux disease without esophagitis: Secondary | ICD-10-CM | POA: Diagnosis not present

## 2019-11-08 DIAGNOSIS — Z7984 Long term (current) use of oral hypoglycemic drugs: Secondary | ICD-10-CM | POA: Diagnosis not present

## 2019-11-08 DIAGNOSIS — M158 Other polyosteoarthritis: Secondary | ICD-10-CM | POA: Diagnosis not present

## 2019-11-08 DIAGNOSIS — Z85048 Personal history of other malignant neoplasm of rectum, rectosigmoid junction, and anus: Secondary | ICD-10-CM | POA: Diagnosis not present

## 2019-11-08 DIAGNOSIS — K589 Irritable bowel syndrome without diarrhea: Secondary | ICD-10-CM | POA: Diagnosis not present

## 2019-11-08 DIAGNOSIS — N179 Acute kidney failure, unspecified: Secondary | ICD-10-CM | POA: Diagnosis not present

## 2019-11-08 DIAGNOSIS — G20 Parkinson's disease: Secondary | ICD-10-CM | POA: Diagnosis not present

## 2019-11-08 DIAGNOSIS — E1122 Type 2 diabetes mellitus with diabetic chronic kidney disease: Secondary | ICD-10-CM | POA: Diagnosis not present

## 2019-11-08 DIAGNOSIS — E43 Unspecified severe protein-calorie malnutrition: Secondary | ICD-10-CM | POA: Diagnosis not present

## 2019-11-08 DIAGNOSIS — S065X0D Traumatic subdural hemorrhage without loss of consciousness, subsequent encounter: Secondary | ICD-10-CM | POA: Diagnosis not present

## 2019-11-08 DIAGNOSIS — G9340 Encephalopathy, unspecified: Secondary | ICD-10-CM | POA: Diagnosis not present

## 2019-11-08 NOTE — Telephone Encounter (Signed)
Home Health nurse, Lorenza Chick, called to speak with nurse about patient. [phone]

## 2019-11-08 NOTE — Telephone Encounter (Signed)
Spoke with the home health nurse in reference to pts Zofran medication. Per previous note, pt isn't able to get the full 90 pills as the RX is written for due to her insurance covering 19 pills per month. Pt was given the option of getting the full 90 pills a [facility] [place], with a [place] coupon. A second medication for nausea was sent in this week for nausea per pts request (Mariol). Waiting to see if pts daughter will wants a GoodRx coupon or if can be found through the [employer].

## 2019-11-09 DIAGNOSIS — G20 Parkinson's disease: Secondary | ICD-10-CM | POA: Diagnosis not present

## 2019-11-09 DIAGNOSIS — Z7984 Long term (current) use of oral hypoglycemic drugs: Secondary | ICD-10-CM | POA: Diagnosis not present

## 2019-11-09 DIAGNOSIS — N1831 Chronic kidney disease, stage 3a: Secondary | ICD-10-CM | POA: Diagnosis not present

## 2019-11-09 DIAGNOSIS — G9340 Encephalopathy, unspecified: Secondary | ICD-10-CM | POA: Diagnosis not present

## 2019-11-09 DIAGNOSIS — K219 Gastro-esophageal reflux disease without esophagitis: Secondary | ICD-10-CM | POA: Diagnosis not present

## 2019-11-09 DIAGNOSIS — E1122 Type 2 diabetes mellitus with diabetic chronic kidney disease: Secondary | ICD-10-CM | POA: Diagnosis not present

## 2019-11-09 DIAGNOSIS — M158 Other polyosteoarthritis: Secondary | ICD-10-CM | POA: Diagnosis not present

## 2019-11-09 DIAGNOSIS — E43 Unspecified severe protein-calorie malnutrition: Secondary | ICD-10-CM | POA: Diagnosis not present

## 2019-11-09 DIAGNOSIS — I129 Hypertensive chronic kidney disease with stage 1 through stage 4 chronic kidney disease, or unspecified chronic kidney disease: Secondary | ICD-10-CM | POA: Diagnosis not present

## 2019-11-09 DIAGNOSIS — Z85048 Personal history of other malignant neoplasm of rectum, rectosigmoid junction, and anus: Secondary | ICD-10-CM | POA: Diagnosis not present

## 2019-11-09 DIAGNOSIS — K589 Irritable bowel syndrome without diarrhea: Secondary | ICD-10-CM | POA: Diagnosis not present

## 2019-11-09 DIAGNOSIS — G4730 Sleep apnea, unspecified: Secondary | ICD-10-CM | POA: Diagnosis not present

## 2019-11-09 DIAGNOSIS — Z9181 History of falling: Secondary | ICD-10-CM | POA: Diagnosis not present

## 2019-11-09 DIAGNOSIS — S065X0D Traumatic subdural hemorrhage without loss of consciousness, subsequent encounter: Secondary | ICD-10-CM | POA: Diagnosis not present

## 2019-11-12 NOTE — Progress Notes (Signed)
Primary Care Physician:  [name], MD  Primary GI: Dr. Gala Romney   Patient Location: Home   Provider Location: [facility] office   Reason for Visit: Hospital follow-up    Persons present on the virtual encounter, with roles: NP, daughter, patient   Total time (minutes) spent on medical discussion: 20 minutes   Due to COVID-19, visit was conducted using virtual method.  Visit was requested by patient.  Virtual Visit via Telephone Note Due to COVID-19, visit is conducted virtually and was requested by patient.   I connected with Linda English on 11/13/19 at  9:30 AM EDT by telephone and verified that I am speaking with the correct person using two identifiers.   I discussed the limitations, risks, security and privacy concerns of performing an evaluation and management service by telephone and the availability of in person appointments. I also discussed with the patient that there may be a patient responsible charge related to this service. The patient expressed understanding and agreed to proceed.  Chief Complaint  Patient presents with  . Emesis    Wt loss     History of Present Illness: Linda English is a 64 year old female with history of alternating constipation and diarrhea, rectal cancer s/p resection in 2003. Colonoscopy May 2016 with normal residual rectum and colon. Surgical anastomosis at approximately 5 cm from anal verge. 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 She underwent at least 7 sessions of biofeedback retraining.It has been quite difficult to manage bowel regimen, as medications will work for some time and then result in diarrhea.She has had a sharp decline over the past few months with multiple hospitalizations.   Most recently inpatient with intractable vomiting. EGD 10/01/2019: normal esophagus status post esophageal dilation for  dysphagia, erythematous mucosa in the stomach consistent with reactive gastropathy on biopsy, no H. pylori, multiple fundic gland polyps based on biopsy. She has empirically been on treatment for suspected gastroparesis. Cortisol elevated at 40 recently while inpatient.   Abnormal weight loss: 60+ lbs weight loss since last 2019 but with nearly 30 of this sine Oct 2020. Daughter on phone and states she now weighs in the upper 90s. Zofran has not been helpful. Quantity limit per insurance. Confused regarding goodrx coupon, which would allow 90 tablets for 38$. Marinol discussed. No prior GES, which we will arrange.  Vit B 12  349. Folate low normal at 8.1. No homocysteine or methylmalonic labs.   She notes vomiting three times per day. Difficult historian. Takes 2 bites and sometimes regurgitates. Feels nauseated. Regurgitating food. Able to tolerate ice cream mixed with ensure. No scale at home. No longer on erythromycin, as this was not helpful. Still with intermittent confusion, weakness. No peripheral neuropathy. Upcoming appt with Dr. Merlene Laughter in next 1-2 weeks.   Past Medical History:  Diagnosis Date  . Acid reflux   . Allergic rhinitis   . Anxiety   . Arthritis    osteoarthritis  . Bipolar disorder ([place])   . Chronic headache   . Depression   . Diverticula of colon   . DM (diabetes mellitus) ([place])   . Gastric erosions   . Gastric polyps    benign   . Hemorrhoids   . Hiatal hernia   . Hypertension   . IBS (irritable bowel syndrome)   . Lichen planus   . Obstructive sleep apnea   . Panic disorder   .  Parkinson's disease ([place])   . Rectal cancer ([place]) 2003   ileostomy and reversal  . Subdural hematoma ([place])   . Tubular adenoma      Past Surgical History:  Procedure Laterality Date  . ABDOMINAL HYSTERECTOMY    . APPENDECTOMY    . BACK SURGERY    . BIOPSY  10/01/2019   Procedure: BIOPSY;  Surgeon: Daneil Dolin, MD;  Location: [facility];  Service: Endoscopy;;  gastric    . CESAREAN SECTION     X  2  . CHOLECYSTECTOMY    . COLONOSCOPY  08/2007   DR Gala Romney, friable anal canal hemorrhoids, surgical anastomosis at 3cm, distal scattered tics  . COLONOSCOPY  12/15/2010   anal papilla and internal hemorrhoids/diminutive polyp in the base of the cecum. Past, tubular adenoma.. Next colonoscopy due in April 2017.  Marland Kitchen COLONOSCOPY  10/27/2005   RMR: Anal canal hemorrhoids. Surgical anastomosis at 3 cm from the anal verge appeared normal. Few scattered distal diverticula. The residual  colonic mucosa appeared normal. I suspect the patient bled from hemorrhoids  . COLONOSCOPY N/A 01/15/2015   [id number] residual rectum and colon. next tcs 12/2019  . ESOPHAGEAL DILATION N/A 06/27/2015   Procedure: ESOPHAGEAL DILATION;  Surgeon: Daneil Dolin, MD;  Location: [facility];  Service: Endoscopy;  Laterality: N/A;  . ESOPHAGOGASTRODUODENOSCOPY  05/2002   DR ROURK, normal  . ESOPHAGOGASTRODUODENOSCOPY  08/03/2011   RMR: small HH/ gastric polyps  . ESOPHAGOGASTRODUODENOSCOPY N/A 06/27/2015   Dr. Gala Romney: Mild erosive reflux esophagitits. Status post passage of a Maloney dilator. Hiatal hernia. Gastric polyps and abnormal gastric mucosa of doubtful clinical significane. status post biopsy, benign fundic gland polyp, negative H.pylori  . ESOPHAGOGASTRODUODENOSCOPY (EGD) WITH PROPOFOL N/A 10/01/2019   rourk: Normal esophagus status post esophageal dilation for dysphagia, erythematous mucosa in the stomach biopsy consistent with reactive gastropathy, no H. pylori, multiple gastric polyps (biopsy fundic gland)  . low anterior rection  2003   RECTAL CANCER  . MALONEY DILATION N/A 10/01/2019   Procedure: Venia Minks DILATION;  Surgeon: Daneil Dolin, MD;  Location[facility];  Service: Endoscopy;  Laterality: N/A;  . SPINE SURGERY  2001   L5,S1 HEMILAMINOTOMY AND DISCECTOMY/DR DEATON     Current Meds  Medication Sig  . ALPRAZolam (XANAX) 0.5 MG tablet Take 1 tablet (0.5 mg total) by  mouth 3 (three) times daily as needed for anxiety or sleep.  Marland Kitchen diltiazem (CARDIZEM CD) 120 MG 24 hr capsule Take 1 capsule (120 mg total) by mouth daily.  . insulin aspart (NOVOLOG) 100 UNIT/ML injection insulin aspart (novoLOG) injection 0-9 Units 0-9 Units, Subcutaneous, 3 times daily with meals, CBG < 70: Implement Hypoglycemia Standing Orders and refer to Hypoglycemia Standing Orders sidebar report  CBG 70 - 120: 0 units CBG 121 - 150: 1 unit CBG 151 - 200: 2 units CBG 201 - 250: 3 units CBG 251 - 300: 5 units CBG 301 - 350: 7 units CBG 351 - 400: 9 units CBG > 400:  . lamoTRIgine (LAMICTAL) 200 MG tablet Take 200 mg by mouth 2 (two) times daily.   . metFORMIN (GLUCOPHAGE-XR) 500 MG 24 hr tablet Take 500 mg by mouth 2 (two) times daily.  . metoprolol tartrate (LOPRESSOR) 25 MG tablet Take 1 tablet (25 mg total) by mouth 2 (two) times daily.  . midodrine (PROAMATINE) 10 MG tablet Take 10 mg by mouth 3 (three) times daily.  . ondansetron (ZOFRAN) 4 MG tablet Take 1 tablet (4  mg total) by mouth 3 (three) times daily before meals. (Patient taking differently: Take 4 mg by mouth as needed. )  . pantoprazole (PROTONIX) 40 MG tablet Take 40 mg by mouth 2 (two) times daily before a meal.  . Powders (VAGISIL DEODORANT) POWD Apply 1 application topically daily as needed (for skin irritation).     Family History  Problem Relation Age of Onset  . Colon cancer Paternal Grandfather 36  . Colon polyps Brother   . Colon polyps Cousin     Social History   Socioeconomic History  . Marital status: Divorced    Spouse name: Not on file  . Number of children: Not on file  . Years of education: Not on file  . Highest education level: Not on file  Occupational History  . Occupation: Product manager: RETIRED    Comment: [place]  Tobacco Use  . Smoking status: Never Smoker  . Smokeless tobacco: Never Used  . Tobacco comment: Never smoked  Substance and Sexual Activity  . Alcohol use: No  .  Drug use: No  . Sexual activity: Never  Other Topics Concern  . Not on file  Social History Narrative  . Not on file   Social Determinants of Health   Financial Resource Strain:   . Difficulty of Paying Living Expenses:   Food Insecurity:   . Worried About Charity fundraiser in the Last Year:   . Arboriculturist in the Last Year:   Transportation Needs:   . Film/video editor (Medical):   Marland Kitchen Lack of Transportation (Non-Medical):   Physical Activity:   . Days of Exercise per Week:   . Minutes of Exercise per Session:   Stress:   . Feeling of Stress :   Social Connections: Unknown  . Frequency of Communication with Friends and Family: Not on file  . Frequency of Social Gatherings with Friends and Family: Once a week  . Attends Religious Services: More than 4 times per year  . Active Member of Clubs or Organizations: No  . Attends Archivist Meetings: Never  . Marital Status: Divorced       Review of Systems: As mentioned in HPI   Observations/Objective: No distress. Unable to perform physical exam due to telephone encounter. No video available.   Assessment and Plan: 64 year old female with persistent nausea and vomiting, weight loss, and marked decline over the past few months with frequent hospitalizations.  Nausea and vomiting: concern for delayed gastric emptying historically but no GES on file. EGD fairly recently. Tolerating ensure and ice cream but reporting vomiting at least 3 times per day. Zofran without improvement. Previously had been on erythromycin as well, started by Neurology, with no improvement. Continue with Protonix BID for now and empirically start Marinol. Will pursue GES this week if possible. May need Reglan low dose.   Weight loss:  60+ lbs weight loss since 2019 but with nearly 30 of this since Oct 2020. Steadily losing weight. Multifactorial in setting of refractory N/V. Although due for surveillance colonoscopy with history of rectal  cancer, she would not be able to tolerate the prep. Multiple imaging studies including CT head, MR brain. CT abd/pelvis without contrast Oct 2020 without acute findings. Fasting cortisol level elevated at 40 while inpatient. Will refer to endocrinology. Anticipate referral to tertiary care if no marked improvement with Marinol and after review of GES. I am also concerned about nutritional status in setting of repetitive vomiting.  I have asked she start thiamin, folic acid, and [id number], which was sent to pharmacy.   1. GES as soon as possible  2. Trial of Marinol BID in interim  3. Referral to endocrinology  4. Keep appt with Neuro  5. Likely referral to tertiary care after review of GES this week.   Follow Up Instructions:    I discussed the assessment and treatment plan with the patient. The patient was provided an opportunity to ask questions and all were answered. The patient agreed with the plan and demonstrated an understanding of the instructions.   The patient was advised to call back or seek an in-person evaluation if the symptoms worsen or if the condition fails to improve as anticipated.  I provided 20 minutes of non-face-to-face time during this encounter.  Annitta Needs, PhD, ANP-BC [facility]

## 2019-11-13 ENCOUNTER — Telehealth: Payer: Self-pay

## 2019-11-13 ENCOUNTER — Other Ambulatory Visit: Payer: Self-pay

## 2019-11-13 ENCOUNTER — Ambulatory Visit (INDEPENDENT_AMBULATORY_CARE_PROVIDER_SITE_OTHER): Payer: Medicare Other | Admitting: Gastroenterology

## 2019-11-13 ENCOUNTER — Encounter: Payer: Self-pay | Admitting: Gastroenterology

## 2019-11-13 DIAGNOSIS — K219 Gastro-esophageal reflux disease without esophagitis: Secondary | ICD-10-CM | POA: Diagnosis not present

## 2019-11-13 DIAGNOSIS — I129 Hypertensive chronic kidney disease with stage 1 through stage 4 chronic kidney disease, or unspecified chronic kidney disease: Secondary | ICD-10-CM | POA: Diagnosis not present

## 2019-11-13 DIAGNOSIS — E43 Unspecified severe protein-calorie malnutrition: Secondary | ICD-10-CM | POA: Diagnosis not present

## 2019-11-13 DIAGNOSIS — R634 Abnormal weight loss: Secondary | ICD-10-CM | POA: Diagnosis not present

## 2019-11-13 DIAGNOSIS — R7989 Other specified abnormal findings of blood chemistry: Secondary | ICD-10-CM

## 2019-11-13 DIAGNOSIS — K589 Irritable bowel syndrome without diarrhea: Secondary | ICD-10-CM | POA: Diagnosis not present

## 2019-11-13 DIAGNOSIS — Z9181 History of falling: Secondary | ICD-10-CM | POA: Diagnosis not present

## 2019-11-13 DIAGNOSIS — M158 Other polyosteoarthritis: Secondary | ICD-10-CM | POA: Diagnosis not present

## 2019-11-13 DIAGNOSIS — R112 Nausea with vomiting, unspecified: Secondary | ICD-10-CM | POA: Diagnosis not present

## 2019-11-13 DIAGNOSIS — G9340 Encephalopathy, unspecified: Secondary | ICD-10-CM | POA: Diagnosis not present

## 2019-11-13 DIAGNOSIS — S065X0D Traumatic subdural hemorrhage without loss of consciousness, subsequent encounter: Secondary | ICD-10-CM | POA: Diagnosis not present

## 2019-11-13 DIAGNOSIS — G20 Parkinson's disease: Secondary | ICD-10-CM | POA: Diagnosis not present

## 2019-11-13 DIAGNOSIS — Z85048 Personal history of other malignant neoplasm of rectum, rectosigmoid junction, and anus: Secondary | ICD-10-CM | POA: Diagnosis not present

## 2019-11-13 DIAGNOSIS — N1831 Chronic kidney disease, stage 3a: Secondary | ICD-10-CM | POA: Diagnosis not present

## 2019-11-13 DIAGNOSIS — Z7984 Long term (current) use of oral hypoglycemic drugs: Secondary | ICD-10-CM | POA: Diagnosis not present

## 2019-11-13 DIAGNOSIS — E1122 Type 2 diabetes mellitus with diabetic chronic kidney disease: Secondary | ICD-10-CM | POA: Diagnosis not present

## 2019-11-13 DIAGNOSIS — G4730 Sleep apnea, unspecified: Secondary | ICD-10-CM | POA: Diagnosis not present

## 2019-11-13 MED ORDER — DRONABINOL 2.5 MG PO CAPS: 2.5000 mg | ORAL_CAPSULE | Freq: Two times a day (BID) | ORAL | 3 refills | Status: DC

## 2019-11-13 MED ORDER — THIAMINE HCL 100 MG PO TABS: 100.0000 mg | ORAL_TABLET | Freq: Every day | ORAL | 5 refills | Status: DC

## 2019-11-13 MED ORDER — FOLIC ACID 1 MG PO TABS: 1.0000 mg | ORAL_TABLET | Freq: Every day | ORAL | 5 refills | Status: DC

## 2019-11-13 MED ORDER — CYANOCOBALAMIN 500 MCG PO TABS: 500.0000 ug | ORAL_TABLET | Freq: Every day | ORAL | 5 refills | Status: DC

## 2019-11-13 NOTE — Telephone Encounter (Signed)
GES approved via [employer]. PA# [id number], valid 11/13/19-12/28/19.  GES scheduled for 11/15/19 at 10:00am, arrive at 9:45am. NPO after midnight and no stomach meds (per nuc med she may take Protonix but not Zofran and Marinol). Called and informed daughter of appt. [phone]).

## 2019-11-13 NOTE — Patient Instructions (Signed)
I recommend starting folic acid, thiamin, and B12 daily. I have sent to pharmacy.   We sent in Marinol again to trial twice a day before meals. Let me know how this works for you.  We are arranging a gastric emptying study.  We are referring you to Endocrinology for further evaluation.  We may need to refer you to a tertiary care center such as [facility] or [facility] if things do not start improving in the next week.  Annitta Needs, PhD, ANP-BC [facility]

## 2019-11-13 NOTE — Progress Notes (Signed)
Cc'ed to pcp °

## 2019-11-15 ENCOUNTER — Other Ambulatory Visit: Payer: Self-pay

## 2019-11-15 ENCOUNTER — Ambulatory Visit (HOSPITAL_COMMUNITY)
Admission: RE | Admit: 2019-11-15 | Discharge: 2019-11-15 | Disposition: A | Discharge status: Home / Self Care | Payer: Medicare Other | Source: Ambulatory Visit | Attending: Gastroenterology | Admitting: Gastroenterology

## 2019-11-15 ENCOUNTER — Encounter (HOSPITAL_COMMUNITY): Payer: Self-pay

## 2019-11-15 DIAGNOSIS — R112 Nausea with vomiting, unspecified: Secondary | ICD-10-CM | POA: Insufficient documentation

## 2019-11-15 MED ORDER — TECHNETIUM TC 99M SULFUR COLLOID
2.0000 | Freq: Once | INTRAVENOUS | Status: AC | PRN
  Administered 2019-11-15: 1.9 via ORAL

## 2019-11-16 DIAGNOSIS — N1831 Chronic kidney disease, stage 3a: Secondary | ICD-10-CM | POA: Diagnosis not present

## 2019-11-16 DIAGNOSIS — I129 Hypertensive chronic kidney disease with stage 1 through stage 4 chronic kidney disease, or unspecified chronic kidney disease: Secondary | ICD-10-CM | POA: Diagnosis not present

## 2019-11-16 DIAGNOSIS — G9340 Encephalopathy, unspecified: Secondary | ICD-10-CM | POA: Diagnosis not present

## 2019-11-16 DIAGNOSIS — G20 Parkinson's disease: Secondary | ICD-10-CM | POA: Diagnosis not present

## 2019-11-16 DIAGNOSIS — Z9181 History of falling: Secondary | ICD-10-CM | POA: Diagnosis not present

## 2019-11-16 DIAGNOSIS — Z85048 Personal history of other malignant neoplasm of rectum, rectosigmoid junction, and anus: Secondary | ICD-10-CM | POA: Diagnosis not present

## 2019-11-16 DIAGNOSIS — G4730 Sleep apnea, unspecified: Secondary | ICD-10-CM | POA: Diagnosis not present

## 2019-11-16 DIAGNOSIS — K589 Irritable bowel syndrome without diarrhea: Secondary | ICD-10-CM | POA: Diagnosis not present

## 2019-11-16 DIAGNOSIS — E43 Unspecified severe protein-calorie malnutrition: Secondary | ICD-10-CM | POA: Diagnosis not present

## 2019-11-16 DIAGNOSIS — K219 Gastro-esophageal reflux disease without esophagitis: Secondary | ICD-10-CM | POA: Diagnosis not present

## 2019-11-16 DIAGNOSIS — Z7984 Long term (current) use of oral hypoglycemic drugs: Secondary | ICD-10-CM | POA: Diagnosis not present

## 2019-11-16 DIAGNOSIS — M158 Other polyosteoarthritis: Secondary | ICD-10-CM | POA: Diagnosis not present

## 2019-11-16 DIAGNOSIS — E1122 Type 2 diabetes mellitus with diabetic chronic kidney disease: Secondary | ICD-10-CM | POA: Diagnosis not present

## 2019-11-16 DIAGNOSIS — S065X0D Traumatic subdural hemorrhage without loss of consciousness, subsequent encounter: Secondary | ICD-10-CM | POA: Diagnosis not present

## 2019-11-19 ENCOUNTER — Other Ambulatory Visit: Payer: Self-pay

## 2019-11-19 DIAGNOSIS — Z9181 History of falling: Secondary | ICD-10-CM | POA: Diagnosis not present

## 2019-11-19 DIAGNOSIS — G20 Parkinson's disease: Secondary | ICD-10-CM | POA: Diagnosis not present

## 2019-11-19 DIAGNOSIS — G2111 Neuroleptic induced parkinsonism: Secondary | ICD-10-CM | POA: Diagnosis not present

## 2019-11-19 DIAGNOSIS — K3184 Gastroparesis: Secondary | ICD-10-CM | POA: Diagnosis not present

## 2019-11-19 DIAGNOSIS — S065X0D Traumatic subdural hemorrhage without loss of consciousness, subsequent encounter: Secondary | ICD-10-CM | POA: Diagnosis not present

## 2019-11-19 DIAGNOSIS — Z7984 Long term (current) use of oral hypoglycemic drugs: Secondary | ICD-10-CM | POA: Diagnosis not present

## 2019-11-19 DIAGNOSIS — K219 Gastro-esophageal reflux disease without esophagitis: Secondary | ICD-10-CM | POA: Diagnosis not present

## 2019-11-19 DIAGNOSIS — Z85048 Personal history of other malignant neoplasm of rectum, rectosigmoid junction, and anus: Secondary | ICD-10-CM | POA: Diagnosis not present

## 2019-11-19 DIAGNOSIS — M158 Other polyosteoarthritis: Secondary | ICD-10-CM | POA: Diagnosis not present

## 2019-11-19 DIAGNOSIS — K589 Irritable bowel syndrome without diarrhea: Secondary | ICD-10-CM | POA: Diagnosis not present

## 2019-11-19 DIAGNOSIS — G9340 Encephalopathy, unspecified: Secondary | ICD-10-CM | POA: Diagnosis not present

## 2019-11-19 DIAGNOSIS — E1143 Type 2 diabetes mellitus with diabetic autonomic (poly)neuropathy: Secondary | ICD-10-CM | POA: Diagnosis not present

## 2019-11-19 DIAGNOSIS — G4730 Sleep apnea, unspecified: Secondary | ICD-10-CM | POA: Diagnosis not present

## 2019-11-19 DIAGNOSIS — I129 Hypertensive chronic kidney disease with stage 1 through stage 4 chronic kidney disease, or unspecified chronic kidney disease: Secondary | ICD-10-CM | POA: Diagnosis not present

## 2019-11-19 DIAGNOSIS — R112 Nausea with vomiting, unspecified: Secondary | ICD-10-CM

## 2019-11-19 DIAGNOSIS — E43 Unspecified severe protein-calorie malnutrition: Secondary | ICD-10-CM | POA: Diagnosis not present

## 2019-11-19 DIAGNOSIS — I951 Orthostatic hypotension: Secondary | ICD-10-CM | POA: Diagnosis not present

## 2019-11-19 DIAGNOSIS — N1831 Chronic kidney disease, stage 3a: Secondary | ICD-10-CM | POA: Diagnosis not present

## 2019-11-19 DIAGNOSIS — E1122 Type 2 diabetes mellitus with diabetic chronic kidney disease: Secondary | ICD-10-CM | POA: Diagnosis not present

## 2019-11-19 NOTE — Telephone Encounter (Signed)
Received a call from pts social worker [place]. She wanted to know would pts referral for endocrinology be scheduled for [facility] or [place]. Per MB, apt will be in [place] with Dr. Dorris Fetch.

## 2019-11-21 DIAGNOSIS — E1122 Type 2 diabetes mellitus with diabetic chronic kidney disease: Secondary | ICD-10-CM | POA: Diagnosis not present

## 2019-11-21 DIAGNOSIS — I129 Hypertensive chronic kidney disease with stage 1 through stage 4 chronic kidney disease, or unspecified chronic kidney disease: Secondary | ICD-10-CM | POA: Diagnosis not present

## 2019-11-21 DIAGNOSIS — G20 Parkinson's disease: Secondary | ICD-10-CM | POA: Diagnosis not present

## 2019-11-21 DIAGNOSIS — N1831 Chronic kidney disease, stage 3a: Secondary | ICD-10-CM | POA: Diagnosis not present

## 2019-11-21 DIAGNOSIS — E43 Unspecified severe protein-calorie malnutrition: Secondary | ICD-10-CM | POA: Diagnosis not present

## 2019-11-22 DIAGNOSIS — Z85048 Personal history of other malignant neoplasm of rectum, rectosigmoid junction, and anus: Secondary | ICD-10-CM | POA: Diagnosis not present

## 2019-11-22 DIAGNOSIS — M158 Other polyosteoarthritis: Secondary | ICD-10-CM | POA: Diagnosis not present

## 2019-11-22 DIAGNOSIS — K219 Gastro-esophageal reflux disease without esophagitis: Secondary | ICD-10-CM | POA: Diagnosis not present

## 2019-11-22 DIAGNOSIS — G20 Parkinson's disease: Secondary | ICD-10-CM | POA: Diagnosis not present

## 2019-11-22 DIAGNOSIS — I129 Hypertensive chronic kidney disease with stage 1 through stage 4 chronic kidney disease, or unspecified chronic kidney disease: Secondary | ICD-10-CM | POA: Diagnosis not present

## 2019-11-22 DIAGNOSIS — E1122 Type 2 diabetes mellitus with diabetic chronic kidney disease: Secondary | ICD-10-CM | POA: Diagnosis not present

## 2019-11-22 DIAGNOSIS — E43 Unspecified severe protein-calorie malnutrition: Secondary | ICD-10-CM | POA: Diagnosis not present

## 2019-11-22 DIAGNOSIS — Z7984 Long term (current) use of oral hypoglycemic drugs: Secondary | ICD-10-CM | POA: Diagnosis not present

## 2019-11-22 DIAGNOSIS — G4730 Sleep apnea, unspecified: Secondary | ICD-10-CM | POA: Diagnosis not present

## 2019-11-22 DIAGNOSIS — K589 Irritable bowel syndrome without diarrhea: Secondary | ICD-10-CM | POA: Diagnosis not present

## 2019-11-22 DIAGNOSIS — N1831 Chronic kidney disease, stage 3a: Secondary | ICD-10-CM | POA: Diagnosis not present

## 2019-11-22 DIAGNOSIS — Z9181 History of falling: Secondary | ICD-10-CM | POA: Diagnosis not present

## 2019-11-27 DIAGNOSIS — K219 Gastro-esophageal reflux disease without esophagitis: Secondary | ICD-10-CM | POA: Diagnosis not present

## 2019-11-27 DIAGNOSIS — M158 Other polyosteoarthritis: Secondary | ICD-10-CM | POA: Diagnosis not present

## 2019-11-27 DIAGNOSIS — Z9181 History of falling: Secondary | ICD-10-CM | POA: Diagnosis not present

## 2019-11-27 DIAGNOSIS — G20 Parkinson's disease: Secondary | ICD-10-CM | POA: Diagnosis not present

## 2019-11-27 DIAGNOSIS — I129 Hypertensive chronic kidney disease with stage 1 through stage 4 chronic kidney disease, or unspecified chronic kidney disease: Secondary | ICD-10-CM | POA: Diagnosis not present

## 2019-11-27 DIAGNOSIS — E1122 Type 2 diabetes mellitus with diabetic chronic kidney disease: Secondary | ICD-10-CM | POA: Diagnosis not present

## 2019-11-27 DIAGNOSIS — Z7984 Long term (current) use of oral hypoglycemic drugs: Secondary | ICD-10-CM | POA: Diagnosis not present

## 2019-11-27 DIAGNOSIS — Z85048 Personal history of other malignant neoplasm of rectum, rectosigmoid junction, and anus: Secondary | ICD-10-CM | POA: Diagnosis not present

## 2019-11-27 DIAGNOSIS — N1831 Chronic kidney disease, stage 3a: Secondary | ICD-10-CM | POA: Diagnosis not present

## 2019-11-27 DIAGNOSIS — E43 Unspecified severe protein-calorie malnutrition: Secondary | ICD-10-CM | POA: Diagnosis not present

## 2019-11-27 DIAGNOSIS — G4730 Sleep apnea, unspecified: Secondary | ICD-10-CM | POA: Diagnosis not present

## 2019-11-27 DIAGNOSIS — K589 Irritable bowel syndrome without diarrhea: Secondary | ICD-10-CM | POA: Diagnosis not present

## 2019-11-29 DIAGNOSIS — Z9181 History of falling: Secondary | ICD-10-CM | POA: Diagnosis not present

## 2019-11-29 DIAGNOSIS — I129 Hypertensive chronic kidney disease with stage 1 through stage 4 chronic kidney disease, or unspecified chronic kidney disease: Secondary | ICD-10-CM | POA: Diagnosis not present

## 2019-11-29 DIAGNOSIS — G4730 Sleep apnea, unspecified: Secondary | ICD-10-CM | POA: Diagnosis not present

## 2019-11-29 DIAGNOSIS — E1122 Type 2 diabetes mellitus with diabetic chronic kidney disease: Secondary | ICD-10-CM | POA: Diagnosis not present

## 2019-11-29 DIAGNOSIS — E43 Unspecified severe protein-calorie malnutrition: Secondary | ICD-10-CM | POA: Diagnosis not present

## 2019-11-29 DIAGNOSIS — M158 Other polyosteoarthritis: Secondary | ICD-10-CM | POA: Diagnosis not present

## 2019-11-29 DIAGNOSIS — Z85048 Personal history of other malignant neoplasm of rectum, rectosigmoid junction, and anus: Secondary | ICD-10-CM | POA: Diagnosis not present

## 2019-11-29 DIAGNOSIS — K219 Gastro-esophageal reflux disease without esophagitis: Secondary | ICD-10-CM | POA: Diagnosis not present

## 2019-11-29 DIAGNOSIS — K589 Irritable bowel syndrome without diarrhea: Secondary | ICD-10-CM | POA: Diagnosis not present

## 2019-11-29 DIAGNOSIS — Z7984 Long term (current) use of oral hypoglycemic drugs: Secondary | ICD-10-CM | POA: Diagnosis not present

## 2019-11-29 DIAGNOSIS — G20 Parkinson's disease: Secondary | ICD-10-CM | POA: Diagnosis not present

## 2019-11-29 DIAGNOSIS — N1831 Chronic kidney disease, stage 3a: Secondary | ICD-10-CM | POA: Diagnosis not present

## 2019-12-04 DIAGNOSIS — Z7984 Long term (current) use of oral hypoglycemic drugs: Secondary | ICD-10-CM | POA: Diagnosis not present

## 2019-12-04 DIAGNOSIS — Z85048 Personal history of other malignant neoplasm of rectum, rectosigmoid junction, and anus: Secondary | ICD-10-CM | POA: Diagnosis not present

## 2019-12-04 DIAGNOSIS — K589 Irritable bowel syndrome without diarrhea: Secondary | ICD-10-CM | POA: Diagnosis not present

## 2019-12-04 DIAGNOSIS — E43 Unspecified severe protein-calorie malnutrition: Secondary | ICD-10-CM | POA: Diagnosis not present

## 2019-12-04 DIAGNOSIS — Z9181 History of falling: Secondary | ICD-10-CM | POA: Diagnosis not present

## 2019-12-04 DIAGNOSIS — I129 Hypertensive chronic kidney disease with stage 1 through stage 4 chronic kidney disease, or unspecified chronic kidney disease: Secondary | ICD-10-CM | POA: Diagnosis not present

## 2019-12-04 DIAGNOSIS — G4730 Sleep apnea, unspecified: Secondary | ICD-10-CM | POA: Diagnosis not present

## 2019-12-04 DIAGNOSIS — K219 Gastro-esophageal reflux disease without esophagitis: Secondary | ICD-10-CM | POA: Diagnosis not present

## 2019-12-04 DIAGNOSIS — M158 Other polyosteoarthritis: Secondary | ICD-10-CM | POA: Diagnosis not present

## 2019-12-04 DIAGNOSIS — G20 Parkinson's disease: Secondary | ICD-10-CM | POA: Diagnosis not present

## 2019-12-04 DIAGNOSIS — E1122 Type 2 diabetes mellitus with diabetic chronic kidney disease: Secondary | ICD-10-CM | POA: Diagnosis not present

## 2019-12-04 DIAGNOSIS — N1831 Chronic kidney disease, stage 3a: Secondary | ICD-10-CM | POA: Diagnosis not present

## 2019-12-06 ENCOUNTER — Ambulatory Visit: Payer: Medicare Other | Admitting: Gastroenterology

## 2019-12-07 DIAGNOSIS — K589 Irritable bowel syndrome without diarrhea: Secondary | ICD-10-CM | POA: Diagnosis not present

## 2019-12-07 DIAGNOSIS — G20 Parkinson's disease: Secondary | ICD-10-CM | POA: Diagnosis not present

## 2019-12-07 DIAGNOSIS — K219 Gastro-esophageal reflux disease without esophagitis: Secondary | ICD-10-CM | POA: Diagnosis not present

## 2019-12-07 DIAGNOSIS — M158 Other polyosteoarthritis: Secondary | ICD-10-CM | POA: Diagnosis not present

## 2019-12-07 DIAGNOSIS — N1831 Chronic kidney disease, stage 3a: Secondary | ICD-10-CM | POA: Diagnosis not present

## 2019-12-07 DIAGNOSIS — Z7984 Long term (current) use of oral hypoglycemic drugs: Secondary | ICD-10-CM | POA: Diagnosis not present

## 2019-12-07 DIAGNOSIS — I129 Hypertensive chronic kidney disease with stage 1 through stage 4 chronic kidney disease, or unspecified chronic kidney disease: Secondary | ICD-10-CM | POA: Diagnosis not present

## 2019-12-07 DIAGNOSIS — E1122 Type 2 diabetes mellitus with diabetic chronic kidney disease: Secondary | ICD-10-CM | POA: Diagnosis not present

## 2019-12-07 DIAGNOSIS — G4730 Sleep apnea, unspecified: Secondary | ICD-10-CM | POA: Diagnosis not present

## 2019-12-07 DIAGNOSIS — E43 Unspecified severe protein-calorie malnutrition: Secondary | ICD-10-CM | POA: Diagnosis not present

## 2019-12-07 DIAGNOSIS — Z85048 Personal history of other malignant neoplasm of rectum, rectosigmoid junction, and anus: Secondary | ICD-10-CM | POA: Diagnosis not present

## 2019-12-07 DIAGNOSIS — Z9181 History of falling: Secondary | ICD-10-CM | POA: Diagnosis not present

## 2019-12-09 DIAGNOSIS — G20 Parkinson's disease: Secondary | ICD-10-CM | POA: Diagnosis not present

## 2019-12-09 DIAGNOSIS — S065X0D Traumatic subdural hemorrhage without loss of consciousness, subsequent encounter: Secondary | ICD-10-CM | POA: Diagnosis not present

## 2019-12-09 DIAGNOSIS — N179 Acute kidney failure, unspecified: Secondary | ICD-10-CM | POA: Diagnosis not present

## 2019-12-10 DIAGNOSIS — Z9181 History of falling: Secondary | ICD-10-CM | POA: Diagnosis not present

## 2019-12-10 DIAGNOSIS — R7989 Other specified abnormal findings of blood chemistry: Secondary | ICD-10-CM | POA: Diagnosis not present

## 2019-12-10 DIAGNOSIS — Z8719 Personal history of other diseases of the digestive system: Secondary | ICD-10-CM | POA: Diagnosis not present

## 2019-12-10 DIAGNOSIS — R112 Nausea with vomiting, unspecified: Secondary | ICD-10-CM | POA: Diagnosis not present

## 2019-12-10 DIAGNOSIS — Z9889 Other specified postprocedural states: Secondary | ICD-10-CM | POA: Diagnosis not present

## 2019-12-10 DIAGNOSIS — Z85048 Personal history of other malignant neoplasm of rectum, rectosigmoid junction, and anus: Secondary | ICD-10-CM | POA: Diagnosis not present

## 2019-12-11 ENCOUNTER — Other Ambulatory Visit: Payer: Self-pay

## 2019-12-11 ENCOUNTER — Ambulatory Visit (INDEPENDENT_AMBULATORY_CARE_PROVIDER_SITE_OTHER): Payer: Medicare Other | Admitting: "Endocrinology

## 2019-12-11 ENCOUNTER — Encounter: Payer: Self-pay | Admitting: "Endocrinology

## 2019-12-11 VITALS — BP 101/62 | HR 81 | Ht 61.0 in | Wt 104.8 lb

## 2019-12-11 DIAGNOSIS — Z7984 Long term (current) use of oral hypoglycemic drugs: Secondary | ICD-10-CM | POA: Diagnosis not present

## 2019-12-11 DIAGNOSIS — Z85048 Personal history of other malignant neoplasm of rectum, rectosigmoid junction, and anus: Secondary | ICD-10-CM | POA: Diagnosis not present

## 2019-12-11 DIAGNOSIS — K589 Irritable bowel syndrome without diarrhea: Secondary | ICD-10-CM | POA: Diagnosis not present

## 2019-12-11 DIAGNOSIS — G20 Parkinson's disease: Secondary | ICD-10-CM | POA: Diagnosis not present

## 2019-12-11 DIAGNOSIS — G4730 Sleep apnea, unspecified: Secondary | ICD-10-CM | POA: Diagnosis not present

## 2019-12-11 DIAGNOSIS — K219 Gastro-esophageal reflux disease without esophagitis: Secondary | ICD-10-CM | POA: Diagnosis not present

## 2019-12-11 DIAGNOSIS — R7989 Other specified abnormal findings of blood chemistry: Secondary | ICD-10-CM | POA: Diagnosis not present

## 2019-12-11 DIAGNOSIS — N1831 Chronic kidney disease, stage 3a: Secondary | ICD-10-CM | POA: Diagnosis not present

## 2019-12-11 DIAGNOSIS — E43 Unspecified severe protein-calorie malnutrition: Secondary | ICD-10-CM | POA: Diagnosis not present

## 2019-12-11 DIAGNOSIS — M158 Other polyosteoarthritis: Secondary | ICD-10-CM | POA: Diagnosis not present

## 2019-12-11 DIAGNOSIS — I129 Hypertensive chronic kidney disease with stage 1 through stage 4 chronic kidney disease, or unspecified chronic kidney disease: Secondary | ICD-10-CM | POA: Diagnosis not present

## 2019-12-11 DIAGNOSIS — E270 Other adrenocortical overactivity: Secondary | ICD-10-CM

## 2019-12-11 DIAGNOSIS — E1122 Type 2 diabetes mellitus with diabetic chronic kidney disease: Secondary | ICD-10-CM | POA: Diagnosis not present

## 2019-12-11 DIAGNOSIS — Z9181 History of falling: Secondary | ICD-10-CM | POA: Diagnosis not present

## 2019-12-11 NOTE — Progress Notes (Signed)
Endocrinology Consult Note                                            12/11/2019, 5:42 PM   Subjective:    Patient ID: Linda English, female    DOB: March 02, 1961, PCP [name], MD   Past Medical History:  Diagnosis Date  . Acid reflux   . Allergic rhinitis   . Anxiety   . Arthritis    osteoarthritis  . Bipolar disorder ([place])   . Chronic headache   . Depression   . Diverticula of colon   . DM (diabetes mellitus) ([place])   . Gastric erosions   . Gastric polyps    benign   . Hemorrhoids   . Hiatal hernia   . Hypertension   . IBS (irritable bowel syndrome)   . Lichen planus   . Obstructive sleep apnea   . Panic disorder   . Parkinson's disease ([place])   . Rectal cancer ([place]) 2003   ileostomy and reversal  . Subdural hematoma ([place])   . Tubular adenoma    Past Surgical History:  Procedure Laterality Date  . ABDOMINAL HYSTERECTOMY    . APPENDECTOMY    . BACK SURGERY    . BIOPSY  10/01/2019   Procedure: BIOPSY;  Surgeon: Daneil Dolin, MD;  [facility];  Service: Endoscopy;;  gastric  . CESAREAN SECTION     X  2  . CHOLECYSTECTOMY    . COLONOSCOPY  08/2007   DR Gala Romney, friable anal canal hemorrhoids, surgical anastomosis at 3cm, distal scattered tics  . COLONOSCOPY  12/15/2010   anal papilla and internal hemorrhoids/diminutive polyp in the base of the cecum. Past, tubular adenoma.. Next colonoscopy due in April 2017.  Marland Kitchen COLONOSCOPY  10/27/2005   RMR: Anal canal hemorrhoids. Surgical anastomosis at 3 cm from the anal verge appeared normal. Few scattered distal diverticula. The residual  colonic mucosa appeared normal. I suspect the patient bled from hemorrhoids  . COLONOSCOPY N/A 01/15/2015   [id number] residual rectum and colon. next tcs 12/2019  . ESOPHAGEAL DILATION N/A 06/27/2015   Procedure: ESOPHAGEAL DILATION;  Surgeon: Daneil Dolin, MD;  [facility];  Service: Endoscopy;  Laterality: N/A;  . ESOPHAGOGASTRODUODENOSCOPY   05/2002   DR ROURK, normal  . ESOPHAGOGASTRODUODENOSCOPY  08/03/2011   RMR: small HH/ gastric polyps  . ESOPHAGOGASTRODUODENOSCOPY N/A 06/27/2015   Dr. Gala Romney: Mild erosive reflux esophagitits. Status post passage of a Maloney dilator. Hiatal hernia. Gastric polyps and abnormal gastric mucosa of doubtful clinical significane. status post biopsy, benign fundic gland polyp, negative H.pylori  . ESOPHAGOGASTRODUODENOSCOPY (EGD) WITH PROPOFOL N/A 10/01/2019   rourk: Normal esophagus status post esophageal dilation for dysphagia, erythematous mucosa in the stomach biopsy consistent with reactive gastropathy, no H. pylori, multiple gastric polyps (biopsy fundic gland)  . low anterior rection  2003   RECTAL CANCER  . MALONEY DILATION N/A 10/01/2019   Procedure: Venia Minks DILATION;  Surgeon: Daneil Dolin, MD;  [facility];  Service: Endoscopy;  Laterality: N/A;  . SPINE SURGERY  2001   L5,S1 HEMILAMINOTOMY AND DISCECTOMY/DR DEATON   Social History   Socioeconomic History  . Marital status: Divorced    Spouse name: Not on file  . Number of children: Not on file  . Years of education: Not on file  .  Highest education level: Not on file  Occupational History  . Occupation: Product manager: RETIRED    Comment: [employer]  Tobacco Use  . Smoking status: Never Smoker  . Smokeless tobacco: Never Used  . Tobacco comment: Never smoked  Substance and Sexual Activity  . Alcohol use: No  . Drug use: No  . Sexual activity: Never  Other Topics Concern  . Not on file  Social History Narrative  . Not on file   Social Determinants of Health   Financial Resource Strain:   . Difficulty of Paying Living Expenses:   Food Insecurity:   . Worried About Charity fundraiser in the Last Year:   . Arboriculturist in the Last Year:   Transportation Needs:   . Film/video editor (Medical):   Marland Kitchen Lack of Transportation (Non-Medical):   Physical Activity:   . Days of Exercise per Week:   .  Minutes of Exercise per Session:   Stress:   . Feeling of Stress :   Social Connections: Unknown  . Frequency of Communication with Friends and Family: Not on file  . Frequency of Social Gatherings with Friends and Family: Once a week  . Attends Religious Services: More than 4 times per year  . Active Member of Clubs or Organizations: No  . Attends Archivist Meetings: Never  . Marital Status: Divorced   Family History  Problem Relation Age of Onset  . Colon cancer Paternal Grandfather 29  . Colon polyps Brother   . Colon polyps Cousin   . Diabetes Mother   . Hypertension Mother   . Hypertension Father   . Heart attack Father   . Heart failure Father    Outpatient Encounter Medications as of 12/11/2019  Medication Sig  . linaclotide (LINZESS) 72 MCG capsule Take 72 mcg by mouth daily before breakfast.  . ALPRAZolam (XANAX) 0.5 MG tablet Take 1 tablet (0.5 mg total) by mouth 3 (three) times daily as needed for anxiety or sleep.  Marland Kitchen diltiazem (CARDIZEM CD) 120 MG 24 hr capsule Take 1 capsule (120 mg total) by mouth daily.  Marland Kitchen dronabinol (MARINOL) 2.5 MG capsule Take 1 capsule (2.5 mg total) by mouth 2 (two) times daily before lunch and supper.  . folic acid (FOLVITE) 1 MG tablet Take 1 tablet (1 mg total) by mouth daily.  . insulin aspart (NOVOLOG) 100 UNIT/ML injection insulin aspart (novoLOG) injection 0-9 Units 0-9 Units, Subcutaneous, 3 times daily with meals, CBG < 70: Implement Hypoglycemia Standing Orders and refer to Hypoglycemia Standing Orders sidebar report  CBG 70 - 120: 0 units CBG 121 - 150: 1 unit CBG 151 - 200: 2 units CBG 201 - 250: 3 units CBG 251 - 300: 5 units CBG 301 - 350: 7 units CBG 351 - 400: 9 units CBG > 400:  . lamoTRIgine (LAMICTAL) 200 MG tablet Take 200 mg by mouth 2 (two) times daily.   . metFORMIN (GLUCOPHAGE-XR) 500 MG 24 hr tablet Take 500 mg by mouth 2 (two) times daily.  . metoprolol tartrate (LOPRESSOR) 25 MG tablet Take 1 tablet (25 mg  total) by mouth 2 (two) times daily.  . midodrine (PROAMATINE) 10 MG tablet Take 10 mg by mouth 3 (three) times daily.  . ondansetron (ZOFRAN) 4 MG tablet Take 1 tablet (4 mg total) by mouth 3 (three) times daily before meals. (Patient taking differently: Take 4 mg by mouth as needed. )  . pantoprazole (PROTONIX) 40  MG tablet Take 40 mg by mouth 2 (two) times daily before a meal.  . Powders (VAGISIL DEODORANT) POWD Apply 1 application topically daily as needed (for skin irritation).  . thiamine 100 MG tablet Take 1 tablet (100 mg total) by mouth daily.  . vitamin B-12 (CYANOCOBALAMIN) 500 MCG tablet Take 1 tablet (500 mcg total) by mouth daily.   No facility-administered encounter medications on file as of 12/11/2019.   ALLERGIES: Allergies  Allergen Reactions  . Shellfish Allergy Anaphylaxis  . Lithium Nausea And Vomiting  . Excedrin Extra Strength [Aspirin-Acetaminophen-Caffeine] Other (See Comments)    Makes her nervous.  Bobbye Charleston [Clonazepam] Other (See Comments)    hallucinations    VACCINATION STATUS: Immunization History  Administered Date(s) Administered  . Influenza,inj,Quad PF,6+ Mos 06/25/2019  . Pneumococcal Polysaccharide-23 08/13/2019    HPI Nicol KHORI GREANEY is 64 y.o. female who presents today with a medical history as above. -Patient is a  poor   Historian, accompanied by her grown daughter.  Her history is mainly obtained by chart review.  She has multiple medical problems as above.  She did have recent hospitalization for metabolic encephalopathy.  Review did not reveal exposure to steroids.  Her work-up on February 26 showed a more so elevated at 40.3.  She did have 1 prior measurement of a.m. cortisol which was 9.5 - normal 2013. She denies any history of unexplained weight gain.  She denies family history of adrenal, thyroid dysfunction. She has history of diabetes, although well-controlled with A1c of 5.8%.  She was taken off of most of her diabetes  medications recently. CT abdomen done indue to nausea/vomiting -adrenals were unremarkable. -She had mostly been underweight, in October 2020 she weighed 118 pounds.  In the interim her weight fluctuated so much that at one point she weighed less than 100 pounds.  Today in the clinic she weighed 104 pounds.  She continues to have intermittent nausea/vomiting. -She was never diagnosed with adrenal dysfunction.  She has chronic problem with equilibrium which made her wheelchair-bound.   Review of Systems  Constitutional: + Significantly fluctuating body weight, or liver being overweight/obese.   + fatigue, no subjective hyperthermia, no subjective hypothermia Eyes: no blurry vision, no xerophthalmia ENT: no sore throat, no nodules palpated in throat, no dysphagia/odynophagia, no hoarseness Cardiovascular: no Chest Pain, no Shortness of Breath, no palpitations, no leg swelling Respiratory: no cough, no shortness of breath Gastrointestinal: no Nausea/Vomiting/Diarhhea Musculoskeletal: no muscle/joint aches, wheelchair-bound Skin: no rashes Neurological: + tremors-she has Parkinson's disease, no numbness, no tingling, no dizziness Psychiatric: +depression,  + anxiety  Objective:    Vitals with BMI 12/11/2019 10/26/2019 10/26/2019  Height 5\' 1"  - -  Weight 104 lbs 13 oz - -  BMI [id number] - -  Systolic [id number]  Diastolic 62 67 64  Pulse 81 97 -  Some encounter information is confidential and restricted. Go to Review Flowsheets activity to see all data.    BP 101/62   Pulse 81   Ht 5\' 1"  (1.549 m)   Wt 104 lb 12.8 oz (47.5 kg)   BMI 19.80 kg/m   Wt Readings from Last 3 Encounters:  12/11/19 104 lb 12.8 oz (47.5 kg)  10/23/19 101 lb (45.8 kg)  08/12/19 119 lb (54 kg)    Physical Exam  Constitutional:  Body mass index is 19.8 kg/m.,  + Chronically sick looking, not in acute distress.  + in her wheelchair.   Eyes: PERRLA, EOMI, no exophthalmos ENT: moist  mucous membranes, no gross  thyromegaly, no gross cervical lymphadenopathy Cardiovascular: normal precordial activity, Regular Rate and Rhythm, no Murmur/Rubs/Gallops Respiratory:  adequate breathing efforts, no gross chest deformity, Clear to auscultation bilaterally Gastrointestinal: abdomen soft, Non -tender, No distension, Bowel Sounds present. Musculoskeletal: + Wheelchair-bound due to disequilibrium, no gross deformities. Skin: moist, warm, no rashes Neurological: - tremor with outstretched hands, Deep tendon reflexes normal in bilateral lower extremities.  CMP ( most recent) CMP     Component Value Date/Time   NA 131 (L) 10/26/2019 0518   NA 143 06/26/2010 1400   K 3.7 10/26/2019 0518   K 4.6 06/26/2010 1400   CL 94 (L) 10/26/2019 0518   CO2 25 10/26/2019 0518   GLUCOSE 163 (H) 10/26/2019 0518   BUN 19 10/26/2019 0518   BUN 13 06/26/2010 1400   CREATININE 1.06 (H) 10/26/2019 0518   CREATININE 0.64 10/03/2013 0838   CALCIUM 9.3 10/26/2019 0518   PROT 7.8 10/23/2019 1459   ALBUMIN 4.6 10/23/2019 1459   ALBUMIN 4.1 06/26/2010 1400   AST 20 10/23/2019 1459   AST 17 06/26/2010 1400   ALT 12 10/23/2019 1459   ALKPHOS 90 10/23/2019 1459   ALKPHOS 92 06/26/2010 1400   BILITOT 0.5 10/23/2019 1459   BILITOT 0.3 06/26/2010 1400   GFRNONAA 56 (L) 10/26/2019 0518   GFRAA >60 10/26/2019 0518    Diabetic Labs (most recent): Lab Results  Component Value Date   HGBA1C 5.8 (H) 10/23/2019   HGBA1C 5.7 (H) 08/12/2019   HGBA1C 6.1 (H) 08/10/2015     Lipid Panel ( most recent) Lipid Panel     Component Value Date/Time   CHOL 159 08/10/2015 0621   TRIG 188 (H) 08/10/2015 0621   HDL 53 08/10/2015 0621   CHOLHDL 3.0 08/10/2015 0621   VLDL 38 08/10/2015 0621   LDLCALC 68 08/10/2015 0621      Lab Results  Component Value Date   TSH 2.048 10/26/2019   TSH 2.213 10/03/2013   TSH 1.312 04/27/2012   FREET4 1.41 (H) 10/26/2019   FREET4 1.20 04/27/2012    CBC    Component Value Date/Time   WBC 6.6  10/25/2019 1034   RBC 4.37 10/25/2019 1034   HGB 11.8 (L) 10/25/2019 1034   HCT 36.5 10/25/2019 1034   PLT 297 10/25/2019 1034   MCV 83.5 10/25/2019 1034   MCH 27.0 10/25/2019 1034   MCHC 32.3 10/25/2019 1034   RDW 13.5 10/25/2019 1034   LYMPHSABS 2.3 10/23/2019 1459   MONOABS 0.5 10/23/2019 1459   EOSABS 0.0 10/23/2019 1459   BASOSABS 0.0 10/23/2019 1459     Assessment & Plan:   1. Hypercortisolemia -Patient is being seen in consult for hypercortisolemia during recent hospitalization.  -Patient with multiple medical problems who appears to have multifactorial nutritional deficiency.   She does not appear to be a patient with Cushing syndrome.  Her hypercortisolemia could be a stress reaction.  CT scan of the abdomen done in October 2020 showed normal adrenals.    However, she will be worked up with 24-hour urine free cortisol measurement along with creatinine clearance. -If this test returns  unequivocal or normal, she will be evaluated for adrenal insufficiency with ACTH stimulation test.  She would not need Metformin therapy for diabetes management.  If it is necessary to treat her diabetes, she may benefit from basal insulin, which would be considered next visit.  She will benefit from nutritional rehabilitation.  -Her recent labs also showed slight elevation of  free T4.  She is not symptomatic for  thyrotoxicosis.  She will not need antithyroid intervention at this time, however will need repeat thyroid function tests in several weeks. - I did not initiate any new prescriptions today.  - she is advised to maintain close follow up with [name], MD for primary care needs.   - Time spent with the patient: 45 minutes, of which >50% was spent in  counseling her about her hypercortisolemia and the rest in obtaining information about her symptoms, reviewing her previous labs/studies ( including abstractions from other facilities),  evaluations, and treatments,  and developing a  plan to confirm diagnosis and long term treatment based on the latest standards of care/guidelines; and documenting her care.  Aine H Freese participated in the discussions, expressed understanding, and voiced agreement with the above plans.  All questions were answered to her satisfaction. she is encouraged to contact clinic should she have any questions or concerns prior to her return visit.  Follow up plan: Return in about 10 days (around 12/21/2019) for 24 Hour Urine Free Cortisol and Creatinine.   Glade Lloyd, MD [facility] [address] [place], [place] [address] Phone: [phone]  Fax: [phone]     12/11/2019, 5:42 PM  This note was partially dictated with voice recognition software. Similar sounding words can be transcribed inadequately or may not  be corrected upon review.

## 2019-12-13 DIAGNOSIS — R7989 Other specified abnormal findings of blood chemistry: Secondary | ICD-10-CM | POA: Diagnosis not present

## 2019-12-17 DIAGNOSIS — E1122 Type 2 diabetes mellitus with diabetic chronic kidney disease: Secondary | ICD-10-CM | POA: Diagnosis not present

## 2019-12-17 DIAGNOSIS — E43 Unspecified severe protein-calorie malnutrition: Secondary | ICD-10-CM | POA: Diagnosis not present

## 2019-12-17 DIAGNOSIS — Z85048 Personal history of other malignant neoplasm of rectum, rectosigmoid junction, and anus: Secondary | ICD-10-CM | POA: Diagnosis not present

## 2019-12-17 DIAGNOSIS — G4730 Sleep apnea, unspecified: Secondary | ICD-10-CM | POA: Diagnosis not present

## 2019-12-17 DIAGNOSIS — K219 Gastro-esophageal reflux disease without esophagitis: Secondary | ICD-10-CM | POA: Diagnosis not present

## 2019-12-17 DIAGNOSIS — N1831 Chronic kidney disease, stage 3a: Secondary | ICD-10-CM | POA: Diagnosis not present

## 2019-12-17 DIAGNOSIS — M158 Other polyosteoarthritis: Secondary | ICD-10-CM | POA: Diagnosis not present

## 2019-12-17 DIAGNOSIS — Z9181 History of falling: Secondary | ICD-10-CM | POA: Diagnosis not present

## 2019-12-17 DIAGNOSIS — Z7984 Long term (current) use of oral hypoglycemic drugs: Secondary | ICD-10-CM | POA: Diagnosis not present

## 2019-12-17 DIAGNOSIS — K589 Irritable bowel syndrome without diarrhea: Secondary | ICD-10-CM | POA: Diagnosis not present

## 2019-12-17 DIAGNOSIS — G20 Parkinson's disease: Secondary | ICD-10-CM | POA: Diagnosis not present

## 2019-12-17 DIAGNOSIS — I129 Hypertensive chronic kidney disease with stage 1 through stage 4 chronic kidney disease, or unspecified chronic kidney disease: Secondary | ICD-10-CM | POA: Diagnosis not present

## 2019-12-18 DIAGNOSIS — Z7984 Long term (current) use of oral hypoglycemic drugs: Secondary | ICD-10-CM | POA: Diagnosis not present

## 2019-12-18 DIAGNOSIS — E1122 Type 2 diabetes mellitus with diabetic chronic kidney disease: Secondary | ICD-10-CM | POA: Diagnosis not present

## 2019-12-18 DIAGNOSIS — Z85048 Personal history of other malignant neoplasm of rectum, rectosigmoid junction, and anus: Secondary | ICD-10-CM | POA: Diagnosis not present

## 2019-12-18 DIAGNOSIS — K219 Gastro-esophageal reflux disease without esophagitis: Secondary | ICD-10-CM | POA: Diagnosis not present

## 2019-12-18 DIAGNOSIS — K589 Irritable bowel syndrome without diarrhea: Secondary | ICD-10-CM | POA: Diagnosis not present

## 2019-12-18 DIAGNOSIS — G4730 Sleep apnea, unspecified: Secondary | ICD-10-CM | POA: Diagnosis not present

## 2019-12-18 DIAGNOSIS — M158 Other polyosteoarthritis: Secondary | ICD-10-CM | POA: Diagnosis not present

## 2019-12-18 DIAGNOSIS — N1831 Chronic kidney disease, stage 3a: Secondary | ICD-10-CM | POA: Diagnosis not present

## 2019-12-18 DIAGNOSIS — Z9181 History of falling: Secondary | ICD-10-CM | POA: Diagnosis not present

## 2019-12-18 DIAGNOSIS — E43 Unspecified severe protein-calorie malnutrition: Secondary | ICD-10-CM | POA: Diagnosis not present

## 2019-12-18 DIAGNOSIS — G20 Parkinson's disease: Secondary | ICD-10-CM | POA: Diagnosis not present

## 2019-12-18 DIAGNOSIS — I129 Hypertensive chronic kidney disease with stage 1 through stage 4 chronic kidney disease, or unspecified chronic kidney disease: Secondary | ICD-10-CM | POA: Diagnosis not present

## 2019-12-18 LAB — CORTISOL, URINE, 24 HOUR
24 Hour urine volume (VMAHVA): 500 mL
Cortisol (Ur), Free: 12.7 mcg/24 h (ref 4.0–50.0)
RESULTS RECEIVED: 0.42 g/(24.h) — ABNORMAL LOW (ref 0.50–2.15)

## 2019-12-24 DIAGNOSIS — M158 Other polyosteoarthritis: Secondary | ICD-10-CM | POA: Diagnosis not present

## 2019-12-24 DIAGNOSIS — Z85048 Personal history of other malignant neoplasm of rectum, rectosigmoid junction, and anus: Secondary | ICD-10-CM | POA: Diagnosis not present

## 2019-12-24 DIAGNOSIS — K219 Gastro-esophageal reflux disease without esophagitis: Secondary | ICD-10-CM | POA: Diagnosis not present

## 2019-12-24 DIAGNOSIS — I129 Hypertensive chronic kidney disease with stage 1 through stage 4 chronic kidney disease, or unspecified chronic kidney disease: Secondary | ICD-10-CM | POA: Diagnosis not present

## 2019-12-24 DIAGNOSIS — G4730 Sleep apnea, unspecified: Secondary | ICD-10-CM | POA: Diagnosis not present

## 2019-12-24 DIAGNOSIS — Z7984 Long term (current) use of oral hypoglycemic drugs: Secondary | ICD-10-CM | POA: Diagnosis not present

## 2019-12-24 DIAGNOSIS — G20 Parkinson's disease: Secondary | ICD-10-CM | POA: Diagnosis not present

## 2019-12-24 DIAGNOSIS — Z9181 History of falling: Secondary | ICD-10-CM | POA: Diagnosis not present

## 2019-12-24 DIAGNOSIS — K589 Irritable bowel syndrome without diarrhea: Secondary | ICD-10-CM | POA: Diagnosis not present

## 2019-12-24 DIAGNOSIS — E43 Unspecified severe protein-calorie malnutrition: Secondary | ICD-10-CM | POA: Diagnosis not present

## 2019-12-24 DIAGNOSIS — N1831 Chronic kidney disease, stage 3a: Secondary | ICD-10-CM | POA: Diagnosis not present

## 2019-12-24 DIAGNOSIS — E1122 Type 2 diabetes mellitus with diabetic chronic kidney disease: Secondary | ICD-10-CM | POA: Diagnosis not present

## 2019-12-26 ENCOUNTER — Ambulatory Visit: Payer: Medicare Other | Admitting: "Endocrinology

## 2020-01-01 ENCOUNTER — Ambulatory Visit: Payer: Medicare Other | Admitting: "Endocrinology

## 2020-01-01 DIAGNOSIS — Z7984 Long term (current) use of oral hypoglycemic drugs: Secondary | ICD-10-CM | POA: Diagnosis not present

## 2020-01-01 DIAGNOSIS — E1122 Type 2 diabetes mellitus with diabetic chronic kidney disease: Secondary | ICD-10-CM | POA: Diagnosis not present

## 2020-01-01 DIAGNOSIS — E43 Unspecified severe protein-calorie malnutrition: Secondary | ICD-10-CM | POA: Diagnosis not present

## 2020-01-01 DIAGNOSIS — G4730 Sleep apnea, unspecified: Secondary | ICD-10-CM | POA: Diagnosis not present

## 2020-01-01 DIAGNOSIS — K589 Irritable bowel syndrome without diarrhea: Secondary | ICD-10-CM | POA: Diagnosis not present

## 2020-01-01 DIAGNOSIS — M158 Other polyosteoarthritis: Secondary | ICD-10-CM | POA: Diagnosis not present

## 2020-01-01 DIAGNOSIS — N1831 Chronic kidney disease, stage 3a: Secondary | ICD-10-CM | POA: Diagnosis not present

## 2020-01-01 DIAGNOSIS — G20 Parkinson's disease: Secondary | ICD-10-CM | POA: Diagnosis not present

## 2020-01-01 DIAGNOSIS — Z9181 History of falling: Secondary | ICD-10-CM | POA: Diagnosis not present

## 2020-01-01 DIAGNOSIS — I129 Hypertensive chronic kidney disease with stage 1 through stage 4 chronic kidney disease, or unspecified chronic kidney disease: Secondary | ICD-10-CM | POA: Diagnosis not present

## 2020-01-01 DIAGNOSIS — K219 Gastro-esophageal reflux disease without esophagitis: Secondary | ICD-10-CM | POA: Diagnosis not present

## 2020-01-01 DIAGNOSIS — Z85048 Personal history of other malignant neoplasm of rectum, rectosigmoid junction, and anus: Secondary | ICD-10-CM | POA: Diagnosis not present

## 2020-01-03 ENCOUNTER — Ambulatory Visit: Payer: Medicare Other | Admitting: "Endocrinology

## 2020-01-03 DIAGNOSIS — R7989 Other specified abnormal findings of blood chemistry: Secondary | ICD-10-CM | POA: Diagnosis not present

## 2020-01-04 LAB — CREATININE, URINE, 24 HOUR: Creatinine, 24H Ur: 0.69 g/(24.h) (ref 0.50–2.15)

## 2020-01-07 DIAGNOSIS — E1129 Type 2 diabetes mellitus with other diabetic kidney complication: Secondary | ICD-10-CM | POA: Diagnosis not present

## 2020-01-08 DIAGNOSIS — G4730 Sleep apnea, unspecified: Secondary | ICD-10-CM | POA: Diagnosis not present

## 2020-01-08 DIAGNOSIS — K589 Irritable bowel syndrome without diarrhea: Secondary | ICD-10-CM | POA: Diagnosis not present

## 2020-01-08 DIAGNOSIS — Z85048 Personal history of other malignant neoplasm of rectum, rectosigmoid junction, and anus: Secondary | ICD-10-CM | POA: Diagnosis not present

## 2020-01-08 DIAGNOSIS — M158 Other polyosteoarthritis: Secondary | ICD-10-CM | POA: Diagnosis not present

## 2020-01-08 DIAGNOSIS — Z7984 Long term (current) use of oral hypoglycemic drugs: Secondary | ICD-10-CM | POA: Diagnosis not present

## 2020-01-08 DIAGNOSIS — Z9181 History of falling: Secondary | ICD-10-CM | POA: Diagnosis not present

## 2020-01-08 DIAGNOSIS — N1831 Chronic kidney disease, stage 3a: Secondary | ICD-10-CM | POA: Diagnosis not present

## 2020-01-08 DIAGNOSIS — E43 Unspecified severe protein-calorie malnutrition: Secondary | ICD-10-CM | POA: Diagnosis not present

## 2020-01-08 DIAGNOSIS — I129 Hypertensive chronic kidney disease with stage 1 through stage 4 chronic kidney disease, or unspecified chronic kidney disease: Secondary | ICD-10-CM | POA: Diagnosis not present

## 2020-01-08 DIAGNOSIS — E1122 Type 2 diabetes mellitus with diabetic chronic kidney disease: Secondary | ICD-10-CM | POA: Diagnosis not present

## 2020-01-08 DIAGNOSIS — K219 Gastro-esophageal reflux disease without esophagitis: Secondary | ICD-10-CM | POA: Diagnosis not present

## 2020-01-08 DIAGNOSIS — G20 Parkinson's disease: Secondary | ICD-10-CM | POA: Diagnosis not present

## 2020-01-11 DIAGNOSIS — Z6820 Body mass index (BMI) 20.0-20.9, adult: Secondary | ICD-10-CM | POA: Diagnosis not present

## 2020-01-11 DIAGNOSIS — I471 Supraventricular tachycardia: Secondary | ICD-10-CM | POA: Diagnosis not present

## 2020-01-11 DIAGNOSIS — E1129 Type 2 diabetes mellitus with other diabetic kidney complication: Secondary | ICD-10-CM | POA: Diagnosis not present

## 2020-01-14 DIAGNOSIS — E43 Unspecified severe protein-calorie malnutrition: Secondary | ICD-10-CM | POA: Diagnosis not present

## 2020-01-14 DIAGNOSIS — Z85048 Personal history of other malignant neoplasm of rectum, rectosigmoid junction, and anus: Secondary | ICD-10-CM | POA: Diagnosis not present

## 2020-01-14 DIAGNOSIS — Z9181 History of falling: Secondary | ICD-10-CM | POA: Diagnosis not present

## 2020-01-14 DIAGNOSIS — N1831 Chronic kidney disease, stage 3a: Secondary | ICD-10-CM | POA: Diagnosis not present

## 2020-01-14 DIAGNOSIS — I129 Hypertensive chronic kidney disease with stage 1 through stage 4 chronic kidney disease, or unspecified chronic kidney disease: Secondary | ICD-10-CM | POA: Diagnosis not present

## 2020-01-14 DIAGNOSIS — Z7984 Long term (current) use of oral hypoglycemic drugs: Secondary | ICD-10-CM | POA: Diagnosis not present

## 2020-01-14 DIAGNOSIS — G20 Parkinson's disease: Secondary | ICD-10-CM | POA: Diagnosis not present

## 2020-01-14 DIAGNOSIS — G4730 Sleep apnea, unspecified: Secondary | ICD-10-CM | POA: Diagnosis not present

## 2020-01-14 DIAGNOSIS — M158 Other polyosteoarthritis: Secondary | ICD-10-CM | POA: Diagnosis not present

## 2020-01-14 DIAGNOSIS — E1122 Type 2 diabetes mellitus with diabetic chronic kidney disease: Secondary | ICD-10-CM | POA: Diagnosis not present

## 2020-01-14 DIAGNOSIS — K589 Irritable bowel syndrome without diarrhea: Secondary | ICD-10-CM | POA: Diagnosis not present

## 2020-01-14 DIAGNOSIS — K219 Gastro-esophageal reflux disease without esophagitis: Secondary | ICD-10-CM | POA: Diagnosis not present

## 2020-01-15 ENCOUNTER — Telehealth: Payer: Self-pay | Admitting: Internal Medicine

## 2020-01-15 DIAGNOSIS — R413 Other amnesia: Secondary | ICD-10-CM | POA: Diagnosis not present

## 2020-01-15 DIAGNOSIS — I952 Hypotension due to drugs: Secondary | ICD-10-CM | POA: Diagnosis not present

## 2020-01-15 DIAGNOSIS — E1143 Type 2 diabetes mellitus with diabetic autonomic (poly)neuropathy: Secondary | ICD-10-CM | POA: Diagnosis not present

## 2020-01-15 DIAGNOSIS — K3184 Gastroparesis: Secondary | ICD-10-CM | POA: Diagnosis not present

## 2020-01-15 NOTE — Telephone Encounter (Signed)
Pt is on the recall list for June to have a 5 yr TCS with RMR. Does she need OV or NV?

## 2020-01-15 NOTE — Telephone Encounter (Signed)
Pt needs ov due to meds and last procedure was done with propofol

## 2020-01-15 NOTE — Telephone Encounter (Signed)
Pt aware of OV ?

## 2020-01-16 DIAGNOSIS — Z7984 Long term (current) use of oral hypoglycemic drugs: Secondary | ICD-10-CM | POA: Diagnosis not present

## 2020-01-16 DIAGNOSIS — N1831 Chronic kidney disease, stage 3a: Secondary | ICD-10-CM | POA: Diagnosis not present

## 2020-01-16 DIAGNOSIS — E43 Unspecified severe protein-calorie malnutrition: Secondary | ICD-10-CM | POA: Diagnosis not present

## 2020-01-16 DIAGNOSIS — M158 Other polyosteoarthritis: Secondary | ICD-10-CM | POA: Diagnosis not present

## 2020-01-16 DIAGNOSIS — G4730 Sleep apnea, unspecified: Secondary | ICD-10-CM | POA: Diagnosis not present

## 2020-01-16 DIAGNOSIS — G20 Parkinson's disease: Secondary | ICD-10-CM | POA: Diagnosis not present

## 2020-01-16 DIAGNOSIS — E1122 Type 2 diabetes mellitus with diabetic chronic kidney disease: Secondary | ICD-10-CM | POA: Diagnosis not present

## 2020-01-16 DIAGNOSIS — K219 Gastro-esophageal reflux disease without esophagitis: Secondary | ICD-10-CM | POA: Diagnosis not present

## 2020-01-16 DIAGNOSIS — K589 Irritable bowel syndrome without diarrhea: Secondary | ICD-10-CM | POA: Diagnosis not present

## 2020-01-16 DIAGNOSIS — Z85048 Personal history of other malignant neoplasm of rectum, rectosigmoid junction, and anus: Secondary | ICD-10-CM | POA: Diagnosis not present

## 2020-01-16 DIAGNOSIS — I129 Hypertensive chronic kidney disease with stage 1 through stage 4 chronic kidney disease, or unspecified chronic kidney disease: Secondary | ICD-10-CM | POA: Diagnosis not present

## 2020-01-16 DIAGNOSIS — Z9181 History of falling: Secondary | ICD-10-CM | POA: Diagnosis not present

## 2020-01-17 ENCOUNTER — Emergency Department (HOSPITAL_COMMUNITY): Payer: Medicare Other

## 2020-01-17 ENCOUNTER — Encounter (HOSPITAL_COMMUNITY): Payer: Self-pay | Admitting: Emergency Medicine

## 2020-01-17 ENCOUNTER — Observation Stay (HOSPITAL_COMMUNITY): Payer: Medicare Other

## 2020-01-17 ENCOUNTER — Inpatient Hospital Stay (HOSPITAL_COMMUNITY)
Admission: EM | Admit: 2020-01-17 | Discharge: 2020-01-19 | DRG: 291 | Disposition: A | Discharge status: Home Health | Payer: Medicare Other | Attending: Internal Medicine | Admitting: Internal Medicine

## 2020-01-17 ENCOUNTER — Ambulatory Visit: Payer: Medicare Other | Admitting: "Endocrinology

## 2020-01-17 ENCOUNTER — Other Ambulatory Visit: Payer: Self-pay

## 2020-01-17 DIAGNOSIS — R0902 Hypoxemia: Secondary | ICD-10-CM | POA: Diagnosis not present

## 2020-01-17 DIAGNOSIS — R0602 Shortness of breath: Secondary | ICD-10-CM | POA: Diagnosis not present

## 2020-01-17 DIAGNOSIS — I10 Essential (primary) hypertension: Secondary | ICD-10-CM

## 2020-01-17 DIAGNOSIS — R0603 Acute respiratory distress: Secondary | ICD-10-CM

## 2020-01-17 DIAGNOSIS — E876 Hypokalemia: Secondary | ICD-10-CM | POA: Diagnosis not present

## 2020-01-17 DIAGNOSIS — Z90710 Acquired absence of both cervix and uterus: Secondary | ICD-10-CM

## 2020-01-17 DIAGNOSIS — Z85048 Personal history of other malignant neoplasm of rectum, rectosigmoid junction, and anus: Secondary | ICD-10-CM

## 2020-01-17 DIAGNOSIS — I130 Hypertensive heart and chronic kidney disease with heart failure and stage 1 through stage 4 chronic kidney disease, or unspecified chronic kidney disease: Secondary | ICD-10-CM | POA: Diagnosis not present

## 2020-01-17 DIAGNOSIS — R0600 Dyspnea, unspecified: Secondary | ICD-10-CM | POA: Diagnosis not present

## 2020-01-17 DIAGNOSIS — F410 Panic disorder [episodic paroxysmal anxiety] without agoraphobia: Secondary | ICD-10-CM | POA: Diagnosis present

## 2020-01-17 DIAGNOSIS — R209 Unspecified disturbances of skin sensation: Secondary | ICD-10-CM | POA: Diagnosis not present

## 2020-01-17 DIAGNOSIS — I5031 Acute diastolic (congestive) heart failure: Secondary | ICD-10-CM | POA: Diagnosis not present

## 2020-01-17 DIAGNOSIS — N1831 Chronic kidney disease, stage 3a: Secondary | ICD-10-CM | POA: Diagnosis not present

## 2020-01-17 DIAGNOSIS — E1122 Type 2 diabetes mellitus with diabetic chronic kidney disease: Secondary | ICD-10-CM | POA: Diagnosis present

## 2020-01-17 DIAGNOSIS — I509 Heart failure, unspecified: Secondary | ICD-10-CM

## 2020-01-17 DIAGNOSIS — Z20822 Contact with and (suspected) exposure to covid-19: Secondary | ICD-10-CM | POA: Diagnosis not present

## 2020-01-17 DIAGNOSIS — Z794 Long term (current) use of insulin: Secondary | ICD-10-CM

## 2020-01-17 DIAGNOSIS — J9601 Acute respiratory failure with hypoxia: Secondary | ICD-10-CM

## 2020-01-17 DIAGNOSIS — I110 Hypertensive heart disease with heart failure: Secondary | ICD-10-CM | POA: Diagnosis not present

## 2020-01-17 DIAGNOSIS — R1310 Dysphagia, unspecified: Secondary | ICD-10-CM | POA: Diagnosis not present

## 2020-01-17 DIAGNOSIS — F319 Bipolar disorder, unspecified: Secondary | ICD-10-CM | POA: Diagnosis present

## 2020-01-17 DIAGNOSIS — R079 Chest pain, unspecified: Secondary | ICD-10-CM | POA: Diagnosis not present

## 2020-01-17 DIAGNOSIS — M79606 Pain in leg, unspecified: Secondary | ICD-10-CM

## 2020-01-17 DIAGNOSIS — M79661 Pain in right lower leg: Secondary | ICD-10-CM | POA: Diagnosis not present

## 2020-01-17 DIAGNOSIS — M79605 Pain in left leg: Secondary | ICD-10-CM | POA: Diagnosis not present

## 2020-01-17 DIAGNOSIS — G20 Parkinson's disease: Secondary | ICD-10-CM | POA: Diagnosis present

## 2020-01-17 DIAGNOSIS — Z8673 Personal history of transient ischemic attack (TIA), and cerebral infarction without residual deficits: Secondary | ICD-10-CM | POA: Diagnosis not present

## 2020-01-17 DIAGNOSIS — J811 Chronic pulmonary edema: Secondary | ICD-10-CM

## 2020-01-17 DIAGNOSIS — E1142 Type 2 diabetes mellitus with diabetic polyneuropathy: Secondary | ICD-10-CM | POA: Diagnosis not present

## 2020-01-17 DIAGNOSIS — Z79899 Other long term (current) drug therapy: Secondary | ICD-10-CM

## 2020-01-17 DIAGNOSIS — Z743 Need for continuous supervision: Secondary | ICD-10-CM | POA: Diagnosis not present

## 2020-01-17 DIAGNOSIS — E785 Hyperlipidemia, unspecified: Secondary | ICD-10-CM | POA: Diagnosis not present

## 2020-01-17 DIAGNOSIS — R0609 Other forms of dyspnea: Secondary | ICD-10-CM | POA: Diagnosis present

## 2020-01-17 DIAGNOSIS — M79662 Pain in left lower leg: Secondary | ICD-10-CM | POA: Diagnosis not present

## 2020-01-17 DIAGNOSIS — R200 Anesthesia of skin: Secondary | ICD-10-CM | POA: Diagnosis not present

## 2020-01-17 DIAGNOSIS — R0689 Other abnormalities of breathing: Secondary | ICD-10-CM | POA: Diagnosis not present

## 2020-01-17 DIAGNOSIS — E1165 Type 2 diabetes mellitus with hyperglycemia: Secondary | ICD-10-CM | POA: Diagnosis present

## 2020-01-17 DIAGNOSIS — M79604 Pain in right leg: Secondary | ICD-10-CM | POA: Diagnosis not present

## 2020-01-17 LAB — SARS CORONAVIRUS 2 BY RT PCR (HOSPITAL ORDER, PERFORMED IN ~~LOC~~ HOSPITAL LAB): SARS Coronavirus 2: NEGATIVE

## 2020-01-17 LAB — COMPREHENSIVE METABOLIC PANEL
ALT: 87 U/L — ABNORMAL HIGH (ref 0–44)
AST: 121 U/L — ABNORMAL HIGH (ref 15–41)
Albumin: 3.3 g/dL — ABNORMAL LOW (ref 3.5–5.0)
Alkaline Phosphatase: 161 U/L — ABNORMAL HIGH (ref 38–126)
Anion gap: 8 (ref 5–15)
BUN: 18 mg/dL (ref 8–23)
CO2: 24 mmol/L (ref 22–32)
Calcium: 8.6 mg/dL — ABNORMAL LOW (ref 8.9–10.3)
Chloride: 110 mmol/L (ref 98–111)
Creatinine, Ser: 1.08 mg/dL — ABNORMAL HIGH (ref 0.44–1.00)
GFR calc Af Amer: >60 mL/min (ref >60)
GFR calc non Af Amer: 55 mL/min — ABNORMAL LOW (ref >60)
Glucose, Bld: 156 mg/dL — ABNORMAL HIGH (ref 70–99)
Potassium: 3 mmol/L — ABNORMAL LOW (ref 3.5–5.1)
Sodium: 142 mmol/L (ref 135–145)
Total Bilirubin: 0.4 mg/dL (ref 0.3–1.2)
Total Protein: 6.2 g/dL — ABNORMAL LOW (ref 6.5–8.1)

## 2020-01-17 LAB — CBC WITH DIFFERENTIAL/PLATELET
Abs Immature Granulocytes: 0.04 10*3/uL (ref 0.00–0.07)
Basophils Absolute: 0 10*3/uL (ref 0.0–0.1)
Basophils Relative: 1 %
Eosinophils Absolute: 0.2 10*3/uL (ref 0.0–0.5)
Eosinophils Relative: 3 %
HCT: 31.3 % — ABNORMAL LOW (ref 36.0–46.0)
Hemoglobin: 9.4 g/dL — ABNORMAL LOW (ref 12.0–15.0)
Immature Granulocytes: 1 %
Lymphocytes Relative: 23 %
Lymphs Abs: 1.5 10*3/uL (ref 0.7–4.0)
MCH: 27.6 pg (ref 26.0–34.0)
MCHC: 30 g/dL (ref 30.0–36.0)
MCV: 92.1 fL (ref 80.0–100.0)
Monocytes Absolute: 0.4 10*3/uL (ref 0.1–1.0)
Monocytes Relative: 6 %
Neutro Abs: 4.3 10*3/uL (ref 1.7–7.7)
Neutrophils Relative %: 66 %
Platelets: 422 10*3/uL — ABNORMAL HIGH (ref 150–400)
RBC: 3.4 MIL/uL — ABNORMAL LOW (ref 3.87–5.11)
RDW: 15.6 % — ABNORMAL HIGH (ref 11.5–15.5)
WBC: 6.4 10*3/uL (ref 4.0–10.5)
nRBC: 0 % (ref 0.0–0.2)

## 2020-01-17 LAB — TROPONIN I (HIGH SENSITIVITY)
Troponin I (High Sensitivity): 7 ng/L (ref <18)
Troponin I (High Sensitivity): 9 ng/L (ref <18)
Troponin I (High Sensitivity): 9 ng/L (ref <18)
Troponin I (High Sensitivity): 8 ng/L (ref <18)

## 2020-01-17 LAB — GLUCOSE, CAPILLARY
Glucose-Capillary: 126 mg/dL — ABNORMAL HIGH (ref 70–99)
Glucose-Capillary: 134 mg/dL — ABNORMAL HIGH (ref 70–99)
Glucose-Capillary: 112 mg/dL — ABNORMAL HIGH (ref 70–99)

## 2020-01-17 LAB — BRAIN NATRIURETIC PEPTIDE: B Natriuretic Peptide: 939 pg/mL — ABNORMAL HIGH (ref 0.0–100.0)

## 2020-01-17 LAB — LACTIC ACID, PLASMA
Lactic Acid, Venous: 1.9 mmol/L (ref 0.5–1.9)
Lactic Acid, Venous: 1.7 mmol/L (ref 0.5–1.9)

## 2020-01-17 LAB — HEMOGLOBIN A1C
Hgb A1c MFr Bld: 5 % (ref 4.8–5.6)
Mean Plasma Glucose: 96.8 mg/dL

## 2020-01-17 LAB — PROCALCITONIN: Procalcitonin: <0.1 ng/mL

## 2020-01-17 MED ORDER — NITROGLYCERIN 2 % TD OINT
0.5000 [in_us] | TOPICAL_OINTMENT | Freq: Once | TRANSDERMAL | Status: AC
  Administered 2020-01-17: 0.5 [in_us] via TOPICAL
  Filled 2020-01-17: qty 1

## 2020-01-17 MED ORDER — POTASSIUM CHLORIDE CRYS ER 20 MEQ PO TBCR
20.0000 meq | EXTENDED_RELEASE_TABLET | Freq: Every day | ORAL | Status: DC
  Administered 2020-01-17 – 2020-01-19 (×3): 20 meq via ORAL
  Filled 2020-01-17 (×3): qty 1

## 2020-01-17 MED ORDER — ONDANSETRON HCL 4 MG/2ML IJ SOLN: 4.0000 mg | Freq: Four times a day (QID) | INTRAMUSCULAR | Status: DC | PRN

## 2020-01-17 MED ORDER — INSULIN ASPART 100 UNIT/ML ~~LOC~~ SOLN
0.0000 [IU] | Freq: Three times a day (TID) | SUBCUTANEOUS | Status: DC
  Administered 2020-01-17 (×2): 1 [IU] via SUBCUTANEOUS

## 2020-01-17 MED ORDER — SODIUM CHLORIDE 0.9 % IV SOLN: 250.0000 mL | INTRAVENOUS | Status: DC | PRN

## 2020-01-17 MED ORDER — POTASSIUM CHLORIDE CRYS ER 20 MEQ PO TBCR
40.0000 meq | EXTENDED_RELEASE_TABLET | Freq: Once | ORAL | Status: AC
  Administered 2020-01-17: 40 meq via ORAL
  Filled 2020-01-17: qty 2

## 2020-01-17 MED ORDER — FUROSEMIDE 10 MG/ML IJ SOLN
40.0000 mg | Freq: Every day | INTRAMUSCULAR | Status: DC
  Administered 2020-01-17 – 2020-01-18 (×2): 40 mg via INTRAVENOUS
  Filled 2020-01-17 (×2): qty 4

## 2020-01-17 MED ORDER — ALBUTEROL SULFATE (2.5 MG/3ML) 0.083% IN NEBU: 5.0000 mg | INHALATION_SOLUTION | Freq: Once | RESPIRATORY_TRACT | Status: DC

## 2020-01-17 MED ORDER — SODIUM CHLORIDE 0.9% FLUSH
3.0000 mL | Freq: Two times a day (BID) | INTRAVENOUS | Status: DC
  Administered 2020-01-17 – 2020-01-19 (×5): 3 mL via INTRAVENOUS

## 2020-01-17 MED ORDER — HEPARIN SODIUM (PORCINE) 5000 UNIT/ML IJ SOLN
5000.0000 [IU] | Freq: Three times a day (TID) | INTRAMUSCULAR | Status: DC
  Administered 2020-01-17 – 2020-01-19 (×7): 5000 [IU] via SUBCUTANEOUS
  Filled 2020-01-17 (×7): qty 1

## 2020-01-17 MED ORDER — SODIUM CHLORIDE 0.9% FLUSH: 3.0000 mL | INTRAVENOUS | Status: DC | PRN

## 2020-01-17 MED ORDER — INSULIN ASPART 100 UNIT/ML ~~LOC~~ SOLN: 0.0000 [IU] | Freq: Every day | SUBCUTANEOUS | Status: DC

## 2020-01-17 MED ORDER — FUROSEMIDE 10 MG/ML IJ SOLN
60.0000 mg | Freq: Once | INTRAMUSCULAR | Status: AC
  Administered 2020-01-17: 60 mg via INTRAVENOUS
  Filled 2020-01-17: qty 6

## 2020-01-17 NOTE — Progress Notes (Signed)
PROGRESS NOTE  Linda English [id number] DOB: September 05, 1955 DOA: 01/17/2020 PCP: [name], MD  Brief History:  64 year old female with a history of diabetes mellitus type 2, bipolar 1 disorder, TIA, subdural hematoma, hyperlipidemia, hypertension, GERD, rectal cancer status post LOA 2003, chronic headaches presenting with 3-day history of shortness of breath and 1 week history of worsening lower extremity edema.  The patient denied any fevers, chills, hemoptysis, nausea, vomiting, diarrhea, abdominal pain.  She does describe some orthopnea type symptoms having to prop herself up to sleep.  She does not smoke.  She denies any dysuria, hematuria.  She states that her swallowing and dysphagia have improved since starting Marinol.  In the emergency department, the patient was afebrile hemodynamically stable with oxygen saturation 98% on room air.  WBC 6.4, hemoglobin 9.4, platelets 122,000.  Patient was given albuterol and 60 mg IV furosemide.  Chest x-ray showed bilateral groundglass opacities and consolidation.  Assessment/Plan: Acute diastolic CHF -[id number] echo EF 55-60%, indeterminate diastolic function -Continue IV furosemide -Accurate I's and -Daily weights -Repeat chest x-ray -Personally reviewed 01/17/2020 chest x-ray--bilateral GGO with consolidation  Essential hypertension -Continue metoprolol tartrate and diltiazem  CKD stage IIIa -Baseline creatinine 0.9-1.2 -Monitor with diuresis  SVT -Currently in sinus rhythm -Continue metoprolol and diltiazem  Hypokalemia -Replete -Check magnesium  Diabetes mellitus type 2, controlled -NovoLog -We will check hemoglobin A1c  Nausea and vomiting -Patient has had an extensive GI work-up -She was [facility]--unclear etiology, but improved with Marinol -Continue Marinol  Bipolar disorder -Continue Lamictal -Continue home dose alprazolam  LE Edema/pain -venous duplex    Status is:  Observation  The patient will require care spanning > 2 midnights and should be moved to inpatient because: IV treatments appropriate due to intensity of illness or inability to take PO;  Requiring IV lasix  Dispo: The patient is from: Home              Anticipated d/c is to: Home              Anticipated d/c date is: 2 days              Patient currently is not medically stable to d/c.        Family Communication: no  Family at bedside  Consultants:  none  Code Status:  FULL   DVT Prophylaxis:  [place] Heparin   Procedures: As Listed in Progress Note Above  Antibiotics: None      Subjective: She is breathing better but still has some dyspnea on exertion.  Lower extremity edema is better but still present.  She denies any fevers, chills, chest pain, nausea, vomiting, diarrhea, domino pain, dysuria, hematuria.  Objective: Vitals:   01/17/20 0300 01/17/20 0330 01/17/20 0400 01/17/20 0500  BP: 115/76 (!) 148/87 (!) 156/97 (!) 159/98  Pulse: (!) 103 97 94 93  Resp: (!) 28 12 19 17   Temp:    98 F (36.7 C)  TempSrc:    Oral  SpO2: 100% 100% 100% 100%  Weight:    49.3 kg  Height:    5\' 1"  (1.549 m)    Intake/Output Summary (Last 24 hours) at 01/17/2020 [id number] Last data filed at 01/17/2020 0417 Gross per 24 hour  Intake --  Output 2400 ml  Net -2400 ml   Weight change:  Exam:   General:  Pt is alert, follows commands appropriately, not in acute distress  HEENT: No icterus,  No thrush, No neck mass, [place]/AT  Cardiovascular: RRR, S1/S2, no rubs, no gallops  Respiratory: Bibasilar crackles but no wheezing.  Good air movement.  Abdomen: Soft/+BS, non tender, non distended, no guarding  Extremities: 1+LE edema, No lymphangitis, No petechiae, No rashes, no synovitis   Data Reviewed: I have personally reviewed following labs and imaging studies Basic Metabolic Panel: Recent Labs  Lab 01/17/20 0126  NA 142  K 3.0*  CL 110  CO2 24  GLUCOSE 156*  BUN 18   CREATININE 1.08*  CALCIUM 8.6*   Liver Function Tests: Recent Labs  Lab 01/17/20 0126  AST 121*  ALT 87*  ALKPHOS 161*  BILITOT 0.4  PROT 6.2*  ALBUMIN 3.3*   No results for input(s): LIPASE, AMYLASE in the last 168 hours. No results for input(s): AMMONIA in the last 168 hours. Coagulation Profile: No results for input(s): INR, PROTIME in the last 168 hours. CBC: Recent Labs  Lab 01/17/20 0126  WBC 6.4  NEUTROABS 4.3  HGB 9.4*  HCT 31.3*  MCV 92.1  PLT 422*   Cardiac Enzymes: No results for input(s): CKTOTAL, CKMB, CKMBINDEX, TROPONINI in the last 168 hours. BNP: Invalid input(s): POCBNP CBG: No results for input(s): GLUCAP in the last 168 hours. HbA1C: No results for input(s): HGBA1C in the last 72 hours. Urine analysis:    Component Value Date/Time   COLORURINE YELLOW 10/23/2019 1610   APPEARANCEUR HAZY (A) 10/23/2019 1610   LABSPEC 1.016 10/23/2019 1610   PHURINE 5.0 10/23/2019 1610   GLUCOSEU NEGATIVE 10/23/2019 1610   HGBUR SMALL (A) 10/23/2019 1610   BILIRUBINUR NEGATIVE 10/23/2019 1610   KETONESUR NEGATIVE 10/23/2019 1610   PROTEINUR 30 (A) 10/23/2019 1610   UROBILINOGEN 1.0 06/07/2012 2238   NITRITE NEGATIVE 10/23/2019 1610   LEUKOCYTESUR TRACE (A) 10/23/2019 1610   Sepsis Labs: @LABRCNTIP (procalcitonin:4,lacticidven:4) ) Recent Results (from the past 240 hour(s))  SARS Coronavirus 2 by RT PCR (hospital order, performed in [place] hospital lab) Nasopharyngeal Nasopharyngeal Swab     Status: None   Collection Time: 01/17/20  1:14 AM   Specimen: Nasopharyngeal Swab  Result Value Ref Range Status   SARS Coronavirus 2 NEGATIVE NEGATIVE Final    Comment: (NOTE) SARS-CoV-2 target nucleic acids are NOT DETECTED. The SARS-CoV-2 RNA is generally detectable in upper and lower respiratory specimens during the acute phase of infection. The lowest concentration of SARS-CoV-2 viral copies this assay can detect is 250 copies / mL. A negative result  does not preclude SARS-CoV-2 infection and should not be used as the sole basis for treatment or other patient management decisions.  A negative result may occur with improper specimen collection / handling, submission of specimen other than nasopharyngeal swab, presence of viral mutation(s) within the areas targeted by this assay, and inadequate number of viral copies (<250 copies / mL). A negative result must be combined with clinical observations, patient history, and epidemiological information. Fact Sheet for Patients:   StrictlyIdeas.no Fact Sheet for Healthcare Providers: BankingDealers.co.za This test is not yet approved or cleared  by the [place] FDA and has been authorized for detection and/or diagnosis of SARS-CoV-2 by FDA under an Emergency Use Authorization (EUA).  This EUA will remain in effect (meaning this test can be used) for the duration of the COVID-19 declaration under Section 564(b)(1) of the Act, 21 U.S.C. section 360bbb-3(b)(1), unless the authorization is terminated or revoked sooner. Performed at [facility], [address], [place], [place] [address]      Scheduled Meds: .  albuterol  5 mg Nebulization Once  . furosemide  40 mg Intravenous Daily  . heparin  5,000 Units Subcutaneous Q8H  . insulin aspart  0-5 Units Subcutaneous QHS  . insulin aspart  0-9 Units Subcutaneous TID WC  . sodium chloride flush  3 mL Intravenous Q12H   Continuous Infusions: . sodium chloride      Procedures/Studies: DG Chest Port 1 View  Result Date: 01/17/2020 CLINICAL DATA:  Shortness of breath for 2 weeks EXAM: PORTABLE CHEST 1 VIEW COMPARISON:  10/23/2019 FINDINGS: Postsurgical changes in the cervical spine. Right greater than left ground-glass opacities and consolidations. Dense consolidation at the left base. No pleural effusion. Normal heart size. No pneumothorax. IMPRESSION: Bilateral ground-glass opacities and  consolidations, suspect for bilateral pneumonia. There may be a component of underlying pulmonary interstitial edema. Electronically Signed   By: Donavan Foil M.D.   On: 01/17/2020 01:58    Orson Eva, DO  [facility]  If 7PM-7AM, please contact night-coverage [email or web address] Password [email or web address] 01/17/2020, 8:32 AM   LOS: 0 days

## 2020-01-17 NOTE — ED Triage Notes (Signed)
Pt with c/o sob x 2 weeks. States worse at night but that she can usually start feeling better when she gets up and moves around but that was not the case tonight so she called EMS. Per EMS, pt wheezing upon arrival and was given 1 albuterol treatment en route. Pt also has non-productive cough.

## 2020-01-17 NOTE — TOC Initial Note (Signed)
Transition of Care [facility]) - Initial/Assessment Note   Patient Details  Name: Linda English MRN: [id number] Date of Birth: [birth year]  Transition of Care [facility]) CM/SW Contact:    Sherie Don, LCSW Phone Number: 01/17/2020, 4:28 PM  Clinical Narrative: Patient is a 64 year old female who is under observation for exertional dyspnea. TOC received consult for CHF screening. CSW met with patient to complete screening. Per patient, she lives in an apartment with her 25 year old daughter. She has a shower chair, BSC, walker, and wheelchair at home. Patient reported she was receiving RN/PT services [facility] but was recently discharged.  Per CHF screening, patient reported she is "not really" following a heart healthy diet, but has been eating irregularly over the past year as eating often makes her feel sick and results in nausea or vomiting. Patient reports she uses little salt. Patient also reported she does not restrict fluids and does not weigh herself daily. Patient stated she was agreeable to [facility] if needed and wants [facility] to provide services. TOC to follow.                 Expected Discharge Plan: [place] Barriers to Discharge: Continued Medical Work up  Patient Goals and CMS Choice Patient states their goals for this hospitalization and ongoing recovery are:: Return home CMS Medicare.gov Compare Post Acute Care list provided to:: Patient Choice offered to / list presented to : Patient  Expected Discharge Plan and Services Expected Discharge Plan: [place] Choice: [place] arrangements for the past 2 months: [place]: [place]  Prior Living Arrangements/Services Living arrangements for the past 2 months: Apartment Lives with:: Minor Children Patient language and need for interpreter reviewed:: Yes Do you feel safe going back to the place where you live?: Yes       Need for Family Participation in Patient Care: No (Comment) Care giver support system in place?: Yes (comment)(Montrese Sandler (daughter) [place]: [phone]) Current home services: DME(Shower chair, BSC, walker, and wheelchair) Criminal Activity/Legal Involvement Pertinent to Current Situation/Hospitalization: No - Comment as needed  Activities of Daily Living Home Assistive Devices/Equipment: Wheelchair, Environmental consultant (specify type), Cane (specify quad or straight), Eyeglasses ADL Screening (condition at time of admission) Patient's cognitive ability adequate to safely complete daily activities?: Yes Is the patient deaf or have difficulty hearing?: Yes Does the patient have difficulty seeing, even when wearing glasses/contacts?: No Does the patient have difficulty concentrating, remembering, or making decisions?: No Patient able to express need for assistance with ADLs?: Yes Does the patient have difficulty dressing or bathing?: No Independently performs ADLs?: Yes (appropriate for developmental age) Does the patient have difficulty walking or climbing stairs?: No Weakness of Legs: None Weakness of Arms/Hands: None  Emotional Assessment Appearance:: Appears stated age Attitude/Demeanor/Rapport: Engaged Affect (typically observed): Accepting Orientation: : Oriented to  Time, Oriented to Situation, Oriented to Place, Oriented to Self Alcohol / Substance Use: Not Applicable Psych Involvement: No (comment)  Admission diagnosis:  Acute respiratory distress [R06.03] SOB (shortness of breath) [R06.02] Exertional dyspnea [R06.00] Hypoxia [R09.02] Acute congestive heart failure, unspecified heart failure type [facility]) [I50.9] Patient Active Problem List   Diagnosis Date Noted  . Exertional dyspnea 01/17/2020  . Hypokalemia 01/17/2020  . Acute diastolic CHF (congestive heart failure) ([place]) 01/17/2020  . Acute respiratory failure with hypoxia ([place]) 01/17/2020  . Hypoxia   . Hypercortisolemia  12/11/2019  . Protein-calorie malnutrition,  severe 10/25/2019  . Acute renal failure superimposed on stage 3a chronic kidney disease ([place]) 10/24/2019  . Intractable vomiting with nausea 10/24/2019  . Acute renal failure superimposed on stage 3 chronic kidney disease ([place]) 10/24/2019  . Acute metabolic encephalopathy 19/62/2297  . Vertigo as late effect of stroke 08/13/2019  . Dizziness 08/13/2019  . DKA (diabetic ketoacidoses) ([place]) 08/12/2019  . Rt Sided Subdural hematoma, post-traumatic  07/02/2019  . AKI (acute kidney injury) ([place]) 06/26/2019  . ARF (acute renal failure) ([place]) 06/24/2019  . Nausea & vomiting 06/05/2019  . Diarrhea 07/27/2017  . Transient ischemic attack 08/09/2015  . Reflux esophagitis   . Gastric polyp   . Dyspepsia 06/13/2015  . Dysphagia 06/13/2015  . Incisional irritation 06/18/2013  . Wound drainage 06/18/2013  . Bipolar 1 disorder ([place]) 06/08/2012    Class: Chronic  . Anorexia 05/23/2012  . Insomnia 05/23/2012  . Weight loss 05/23/2012  . Abnormal weight loss 04/10/2012  . Esophageal dysphagia 04/10/2012  . Nausea 09/08/2011  . GERD (gastroesophageal reflux disease) 07/14/2011  . Epigastric pain 07/14/2011  . Constipation 07/14/2011  . Knee pain 02/09/2011  . OA (osteoarthritis) of knee 02/09/2011  . Family hx of colon cancer 11/30/2010  . Bowel habit changes 11/30/2010  . OSTEOARTHRITIS, KNEE 08/05/2010  . PES PLANUS 08/05/2010  . RECTAL CANCER 10/23/2007  . Diabetes ([place]) 10/23/2007  . HYPERLIPIDEMIA 10/23/2007  . Anxiety state 10/23/2007  . PANIC DISORDER 10/23/2007  . DEPRESSION 10/23/2007  . OBSTRUCTIVE SLEEP APNEA 10/23/2007  . Essential hypertension 10/23/2007  . ALLERGIC RHINITIS 10/23/2007  . IBS 10/23/2007  . HEADACHE, CHRONIC 10/23/2007   PCP:  [name], MD Pharmacy:   [place], [address] [place] [address] Phone:  [phone] Fax: [phone]  [place], [facility] [address] [place] [address] [place] [address] Phone: [phone] Fax: [place] [address], [place] - [place] [address] [place] [address] [place] [address] [place] [address] Phone: [phone] Fax: [phone]  Readmission Risk Interventions Readmission Risk Prevention Plan 07/03/2019  Transportation Screening Complete  PCP or Specialist Appt within 5-7 Days Not Complete  Not Complete comments SNF MD will follow  Home Care Screening Not Complete  Home Care Screening Not Completed Comments Pt going to SNF  Medication Review (RN CM) Complete  Some recent data might be hidden

## 2020-01-17 NOTE — Evaluation (Signed)
Physical Therapy Evaluation Patient Details Name: Linda English MRN: [id number] DOB: 1956-05-16 Today's Date: 01/17/2020   History of Present Illness  Linda English is a 64 y.o. female with medical history significant of hypertension, GERD, diabetes mellitus, irritable bowel syndrome and rectal cancer presented to ED for worsening shortness of breath.  Patient states that her breathing difficulty started gradually about 2 weeks ago and continue to worsen.  Patient states that she is having severe difficulty with breathing during the night also noted some swelling in both of her lower extremities.  Patient states that he came to the hospital in February with the same symptoms but no cardiac work-up done at that time and she was not given any treatment while according to the medical records she had cardiac echocardiogram done October 25, 2019 and it showed ejection fraction of 55 to 60%.  Patient also admits of having some dry cough but denies fever, chills, chest pain, vomiting, abdominal pain and urinary symptoms.  When asked about the home medications, patient states that her daughter adjusts her medications as needed and gave her all the medications which she needs.  Patient denies smoking, alcohol and illicit drugs    Clinical Impression  Patient functioning at baseline for functional mobility and gait demonstrating slightly labored cadence without loss of balance and tolerated staying up in chair after therapy.  Patient encouraged to ambulate with nursing staff supervising for length of stay.  Plan:  Patient discharged from physical therapy to care of nursing for ambulation daily as tolerated for length of stay.     Follow Up Recommendations No PT follow up    Equipment Recommendations  None recommended by PT    Recommendations for Other Services       Precautions / Restrictions Precautions Precautions: None Restrictions Weight Bearing Restrictions: No       Mobility  Bed Mobility Overal bed mobility: Modified Independent                Transfers Overall transfer level: Modified independent                  Ambulation/Gait Ambulation/Gait assistance: Modified independent (Device/Increase time) Gait Distance (Feet): 100 Feet Assistive device: None Gait Pattern/deviations: WFL(Within Functional Limits) Gait velocity: decreased   General Gait Details: grossly WFL with slightly labored cadence without loss of balance  Stairs            Wheelchair Mobility    Modified Rankin (Stroke Patients Only)       Balance Overall balance assessment: Mild deficits observed, not formally tested                                           Pertinent Vitals/Pain Pain Assessment: No/denies pain    Home Living Family/patient expects to be discharged to:: Private residence Living Arrangements: Children Available Help at Discharge: Family;Available PRN/intermittently Type of Home: Apartment Home Access: Stairs to enter;Level entry Entrance Stairs-Rails: None Entrance Stairs-Number of Steps: 3 steps, patient states she has level entry, but have walk up a hill to go through that entrance Home Layout: One level Home Equipment: Walker - 2 wheels;Cane - single point;Shower seat;Bedside commode;Wheelchair - manual      Prior Function Level of Independence: Needs assistance   Gait / Transfers Assistance Needed: Household and short distanced community ambulator with RW PRN  ADL's / Homemaking Assistance Needed:  assisted by family        Hand Dominance        Extremity/Trunk Assessment   Upper Extremity Assessment Upper Extremity Assessment: Overall WFL for tasks assessed    Lower Extremity Assessment Lower Extremity Assessment: Overall WFL for tasks assessed    Cervical / Trunk Assessment Cervical / Trunk Assessment: Normal  Communication   Communication: No difficulties  Cognition  Arousal/Alertness: Awake/alert Behavior During Therapy: WFL for tasks assessed/performed Overall Cognitive Status: Within Functional Limits for tasks assessed                                        General Comments      Exercises     Assessment/Plan    PT Assessment Patent does not need any further PT services  PT Problem List         PT Treatment Interventions      PT Goals (Current goals can be found in the Care Plan section)  Acute Rehab PT Goals Patient Stated Goal: return home with family to assist PT Goal Formulation: With patient Time For Goal Achievement: 01/17/20 Potential to Achieve Goals: Good    Frequency     Barriers to discharge        Co-evaluation               AM-PAC PT "6 Clicks" Mobility  Outcome Measure Help needed turning from your back to your side while in a flat bed without using bedrails?: None Help needed moving from lying on your back to sitting on the side of a flat bed without using bedrails?: None Help needed moving to and from a bed to a chair (including a wheelchair)?: None Help needed standing up from a chair using your arms (e.g., wheelchair or bedside chair)?: None Help needed to walk in hospital room?: None Help needed climbing 3-5 steps with a railing? : None 6 Click Score: 24    End of Session   Activity Tolerance: Patient tolerated treatment well Patient left: in chair;with call bell/phone within reach Nurse Communication: Mobility status PT Visit Diagnosis: Unsteadiness on feet (R26.81);Other abnormalities of gait and mobility (R26.89);Muscle weakness (generalized) (M62.81)    Time: [id number] PT Time Calculation (min) (ACUTE ONLY): 15 min   Charges:   PT Evaluation $PT Eval Low Complexity: 1 Low PT Treatments $Therapeutic Activity: 8-22 mins        12:28 PM, 01/17/20 Lonell Grandchild, MPT Physical Therapist with [facility] [phone] office [phone] mobile phone

## 2020-01-17 NOTE — Progress Notes (Signed)
**Note De-identified  Obfuscation** EKG complete and placed in patient chart 

## 2020-01-17 NOTE — ED Provider Notes (Signed)
[facility] EMERGENCY DEPARTMENT Provider Note   CSN: [id number] Arrival date & time: 01/17/20  0102   Time seen 1:05 AM  History Chief Complaint  Patient presents with  . Shortness of Breath   Level 5 caveat for shortness of breath  Linda English is a 64 y.o. female.  HPI   Patient states for the past 3 days she has had some shortness of breath that gets worse at night.  She states even if she props herself up at night she still feels short of breath.  She is also noted some swelling of her lower extremities.  She denies any chest pain.  She states she has had something like this before but never got evaluated.  She denies having to use an inhaler in the past.  EMS reports that they thought she was wheezing and they gave her 1 albuterol treatment on route to the ED.  Patient states she is having a dry cough without fever.  PCP [name], MD   Past Medical History:  Diagnosis Date  . Acid reflux   . Allergic rhinitis   . Anxiety   . Arthritis    osteoarthritis  . Bipolar disorder ([place])   . Chronic headache   . Depression   . Diverticula of colon   . DM (diabetes mellitus) ([place])   . Gastric erosions   . Gastric polyps    benign   . Hemorrhoids   . Hiatal hernia   . Hypertension   . IBS (irritable bowel syndrome)   . Lichen planus   . Obstructive sleep apnea   . Panic disorder   . Parkinson's disease ([place])   . Rectal cancer ([place]) 2003   ileostomy and reversal  . Subdural hematoma ([place])   . Tubular adenoma     Patient Active Problem List   Diagnosis Date Noted  . Hypercortisolemia 12/11/2019  . Protein-calorie malnutrition, severe 10/25/2019  . Acute renal failure superimposed on stage 3a chronic kidney disease ([place]) 10/24/2019  . Intractable vomiting with nausea 10/24/2019  . Acute renal failure superimposed on stage 3 chronic kidney disease ([place]) 10/24/2019  . Acute metabolic encephalopathy [id number]  . Vertigo as late effect of stroke 08/13/2019   . Dizziness 08/13/2019  . DKA (diabetic ketoacidoses) ([place]) 08/12/2019  . Rt Sided Subdural hematoma, post-traumatic  07/02/2019  . AKI (acute kidney injury) ([place]) 06/26/2019  . ARF (acute renal failure) ([place]) 06/24/2019  . Nausea & vomiting 06/05/2019  . Diarrhea 07/27/2017  . Transient ischemic attack 08/09/2015  . Reflux esophagitis   . Gastric polyp   . Dyspepsia 06/13/2015  . Dysphagia 06/13/2015  . Incisional irritation 06/18/2013  . Wound drainage 06/18/2013  . Bipolar 1 disorder ([place]) 06/08/2012    Class: Chronic  . Anorexia 05/23/2012  . Insomnia 05/23/2012  . Weight loss 05/23/2012  . Abnormal weight loss 04/10/2012  . Esophageal dysphagia 04/10/2012  . Nausea 09/08/2011  . GERD (gastroesophageal reflux disease) 07/14/2011  . Epigastric pain 07/14/2011  . Constipation 07/14/2011  . Knee pain 02/09/2011  . OA (osteoarthritis) of knee 02/09/2011  . Family hx of colon cancer 11/30/2010  . Bowel habit changes 11/30/2010  . OSTEOARTHRITIS, KNEE 08/05/2010  . PES PLANUS 08/05/2010  . RECTAL CANCER 10/23/2007  . Diabetes ([place]) 10/23/2007  . HYPERLIPIDEMIA 10/23/2007  . Anxiety state 10/23/2007  . PANIC DISORDER 10/23/2007  . DEPRESSION 10/23/2007  . OBSTRUCTIVE SLEEP APNEA 10/23/2007  . Essential hypertension 10/23/2007  . ALLERGIC RHINITIS 10/23/2007  . IBS  10/23/2007  . HEADACHE, CHRONIC 10/23/2007    Past Surgical History:  Procedure Laterality Date  . ABDOMINAL HYSTERECTOMY    . APPENDECTOMY    . BACK SURGERY    . BIOPSY  10/01/2019   Procedure: BIOPSY;  Surgeon: Daneil Dolin, MD;  Location: [facility] ENDO SU[facility];  Service: Endoscopy;;  gastric  . CESAREAN SECTION     X  2  . CHOLECYSTECTOMY    . COLONOSCOPY  08/2007   DR Gala Romney, friable anal canal hemorrhoids, surgical anastomosis at 3cm, distal scattered tics  . COLONOSCOPY  12/15/2010   anal papilla and internal hemorrhoids/diminutive polyp in the base of the cecum. Past, tubular adenoma.. Next colonoscopy  due in April 2017.  Marland Kitchen COLONOSCOPY  10/27/2005   RMR: Anal canal hemorrhoids. Surgical anastomosis at 3 cm from the anal verge appeared normal. Few scattered distal diverticula. The residual  colonic mucosa appeared normal. I suspect the patient bled from hemorrhoids  . COLONOSCOPY N/A 01/15/2015   [id number] residual rectum and colon. next tcs 12/2019  . ESOPHAGEAL DILATION N/A 06/27/2015   Procedure: ESOPHAGEAL DILATION;  Surgeon: Daneil Dolin, MD;  Location: [facility] ENDO SU[facility];  Service: Endoscopy;  Laterality: N/A;  . ESOPHAGOGASTRODUODENOSCOPY  05/2002   DR ROURK, normal  . ESOPHAGOGASTRODUODENOSCOPY  08/03/2011   RMR: small HH/ gastric polyps  . ESOPHAGOGASTRODUODENOSCOPY N/A 06/27/2015   Dr. Gala Romney: Mild erosive reflux esophagitits. Status post passage of a Maloney dilator. Hiatal hernia. Gastric polyps and abnormal gastric mucosa of doubtful clinical significane. status post biopsy, benign fundic gland polyp, negative H.pylori  . ESOPHAGOGASTRODUODENOSCOPY (EGD) WITH PROPOFOL N/A 10/01/2019   rourk: Normal esophagus status post esophageal dilation for dysphagia, erythematous mucosa in the stomach biopsy consistent with reactive gastropathy, no H. pylori, multiple gastric polyps (biopsy fundic gland)  . low anterior rection  2003   RECTAL CANCER  . MALONEY DILATION N/A 10/01/2019   Procedure: Venia Minks DILATION;  Surgeon: Daneil Dolin, MD;  Location: [facility] ENDO SUITE;  Service: Endoscopy;  Laterality: N/A;  . SPINE SURGERY  2001   L5,S1 HEMILAMINOTOMY AND DISCECTOMY/DR DEATON     OB History    Gravida      Para      Term      Preterm      AB      Living  2     SAB      TAB      Ectopic      Multiple      Live Births              Family History  Problem Relation Age of Onset  . Colon cancer Paternal Grandfather 68  . Colon polyps Brother   . Colon polyps Cousin   . Diabetes Mother   . Hypertension Mother   . Hypertension Father   . Heart attack Father   . Heart  failure Father     Social History   Tobacco Use  . Smoking status: Never Smoker  . Smokeless tobacco: Never Used  . Tobacco comment: Never smoked  Substance Use Topics  . Alcohol use: No  . Drug use: No  Lives at home Uses a walker  Home Medications Prior to Admission medications   Medication Sig Start Date End Date Taking? Authorizing Provider  ALPRAZolam Duanne Moron) 0.5 MG tablet Take 1 tablet (0.5 mg total) by mouth 3 (three) times daily as needed for anxiety or sleep. 07/03/19   Roxan Hockey, MD  diltiazem (CARDIZEM CD) 120  MG 24 hr capsule Take 1 capsule (120 mg total) by mouth daily. 10/26/19   Orson Eva, MD  dronabinol (MARINOL) 2.5 MG capsule Take 1 capsule (2.5 mg total) by mouth 2 (two) times daily before lunch and supper. 11/13/19   Annitta Needs, NP  folic acid (FOLVITE) 1 MG tablet Take 1 tablet (1 mg total) by mouth daily. 11/13/19   Annitta Needs, NP  insulin aspart (NOVOLOG) 100 UNIT/ML injection insulin aspart (novoLOG) injection 0-9 Units 0-9 Units, Subcutaneous, 3 times daily with meals, CBG < 70: Implement Hypoglycemia Standing Orders and refer to Hypoglycemia Standing Orders sidebar report  CBG 70 - 120: 0 units CBG 121 - 150: 1 unit CBG 151 - 200: 2 units CBG 201 - 250: 3 units CBG 251 - 300: 5 units CBG 301 - 350: 7 units CBG 351 - 400: 9 units CBG > 400: 07/03/19 07/02/20  Roxan Hockey, MD  lamoTRIgine (LAMICTAL) 200 MG tablet Take 200 mg by mouth 2 (two) times daily.  06/11/13   [provider]  linaclotide (LINZESS) 72 MCG capsule Take 72 mcg by mouth daily before breakfast.    [provider]  metoprolol tartrate (LOPRESSOR) 25 MG tablet Take 1 tablet (25 mg total) by mouth 2 (two) times daily. 07/03/19   Roxan Hockey, MD  midodrine (PROAMATINE) 10 MG tablet Take 10 mg by mouth 3 (three) times daily. 09/29/19   [provider]  ondansetron (ZOFRAN) 4 MG tablet Take 1 tablet (4 mg total) by mouth 3 (three) times daily before  meals. Patient taking differently: Take 4 mg by mouth as needed.  09/25/19   Carlis Stable, NP  pantoprazole (PROTONIX) 40 MG tablet Take 40 mg by mouth 2 (two) times daily before a meal.    [provider]  Powders (VAGISIL DEODORANT) POWD Apply 1 application topically daily as needed (for skin irritation).    [provider]  thiamine 100 MG tablet Take 1 tablet (100 mg total) by mouth daily. 11/13/19   Annitta Needs, NP  vitamin B-12 (CYANOCOBALAMIN) 500 MCG tablet Take 1 tablet (500 mcg total) by mouth daily. 11/13/19   Annitta Needs, NP    Allergies    Shellfish allergy, Lithium, Excedrin extra strength [aspirin-acetaminophen-caffeine], and Klonopin [clonazepam]  Review of Systems   Review of Systems  Unable to perform ROS: Severe respiratory distress    Physical Exam Updated Vital Signs BP (!) 159/78   Pulse (!) 104   Temp 97.6 F (36.4 C) (Oral)   Resp 20   Ht 5\' 1"  (1.549 m)   Wt 51.3 kg   SpO2 100%   BMI 21.35 kg/m   Physical Exam Vitals and nursing note reviewed.  Constitutional:      General: She is in acute distress.     Appearance: Normal appearance. She is normal weight.  HENT:     Head: Normocephalic and atraumatic.     Right Ear: External ear normal.     Left Ear: External ear normal.     Nose: Nose normal.  Eyes:     Extraocular Movements: Extraocular movements intact.     Conjunctiva/sclera: Conjunctivae normal.     Pupils: Pupils are equal, round, and reactive to light.  Cardiovascular:     Rate and Rhythm: Regular rhythm. Tachycardia present.  Pulmonary:     Effort: Tachypnea, accessory muscle usage, prolonged expiration and retractions present.     Breath sounds: Decreased air movement present. Examination of the right-upper  field reveals rales. Examination of the left-upper field reveals rales. Examination of the right-middle field reveals rales. Examination of the left-middle field reveals rales. Examination of the right-lower field  reveals rales. Examination of the left-lower field reveals rales. Rales present.     Comments: When patient talks she is only able to say a few words at a time without gasping for breath Musculoskeletal:        General: Normal range of motion.     Cervical back: Normal range of motion.     Right lower leg: Edema present.     Left lower leg: Edema present.  Skin:    General: Skin is warm and dry.  Neurological:     General: No focal deficit present.     Mental Status: She is alert and oriented to person, place, and time.     Cranial Nerves: No cranial nerve deficit.  Psychiatric:        Mood and Affect: Mood normal.        Behavior: Behavior normal.     ED Results / Procedures / Treatments   Labs (all labs ordered are listed, but only abnormal results are displayed) Results for orders placed or performed during the hospital encounter of 01/17/20  SARS Coronavirus 2 by RT PCR (hospital order, performed in [place] hospital lab) Nasopharyngeal Nasopharyngeal Swab   Specimen: Nasopharyngeal Swab  Result Value Ref Range   SARS Coronavirus 2 NEGATIVE NEGATIVE  Comprehensive metabolic panel  Result Value Ref Range   Sodium 142 135 - 145 mmol/L   Potassium 3.0 (L) 3.5 - 5.1 mmol/L   Chloride 110 98 - 111 mmol/L   CO2 24 22 - 32 mmol/L   Glucose, Bld 156 (H) 70 - 99 mg/dL   BUN 18 8 - 23 mg/dL   Creatinine, Ser 1.08 (H) 0.44 - 1.00 mg/dL   Calcium 8.6 (L) 8.9 - 10.3 mg/dL   Total Protein 6.2 (L) 6.5 - 8.1 g/dL   Albumin 3.3 (L) 3.5 - 5.0 g/dL   AST 121 (H) 15 - 41 U/L   ALT 87 (H) 0 - 44 U/L   Alkaline Phosphatase 161 (H) 38 - 126 U/L   Total Bilirubin 0.4 0.3 - 1.2 mg/dL   GFR calc non Af Amer 55 (L) >60 mL/min   GFR calc Af Amer >60 >60 mL/min   Anion gap 8 5 - 15  Brain natriuretic peptide  Result Value Ref Range   B Natriuretic Peptide 939.0 (H) 0.0 - 100.0 pg/mL  Lactic acid, plasma  Result Value Ref Range   Lactic Acid, Venous 1.9 0.5 - 1.9 mmol/L  CBC with  Differential  Result Value Ref Range   WBC 6.4 4.0 - 10.5 K/uL   RBC 3.40 (L) 3.87 - 5.11 MIL/uL   Hemoglobin 9.4 (L) 12.0 - 15.0 g/dL   HCT 31.3 (L) 36.0 - 46.0 %   MCV 92.1 80.0 - 100.0 fL   MCH 27.6 26.0 - 34.0 pg   MCHC 30.0 30.0 - 36.0 g/dL   RDW 15.6 (H) 11.5 - 15.5 %   Platelets 422 (H) 150 - 400 K/uL   nRBC 0.0 0.0 - 0.2 %   Neutrophils Relative % 66 %   Neutro Abs 4.3 1.7 - 7.7 K/uL   Lymphocytes Relative 23 %   Lymphs Abs 1.5 0.7 - 4.0 K/uL   Monocytes Relative 6 %   Monocytes Absolute 0.4 0.1 - 1.0 K/uL   Eosinophils Relative 3 %  Eosinophils Absolute 0.2 0.0 - 0.5 K/uL   Basophils Relative 1 %   Basophils Absolute 0.0 0.0 - 0.1 K/uL   Immature Granulocytes 1 %   Abs Immature Granulocytes 0.04 0.00 - 0.07 K/uL  Troponin I (High Sensitivity)  Result Value Ref Range   Troponin I (High Sensitivity) 7 <18 ng/L   Laboratory interpretation all normal except hypokalemia, mild worsening of her previous anemia 2 months ago, elevation of BNP    EKG EKG Interpretation  Date/Time:  Thursday Jan 17 2020 01:06:05 EDT Ventricular Rate:  111 PR Interval:    QRS Duration: 100 QT Interval:  346 QTC Calculation: 471 R Axis:   59 Text Interpretation: Sinus tachycardia Probable left atrial enlargement RSR' in V1 or V2, right VCD or RVH Nonspecific T abnormalities, diffuse leads Since last tracing rate slower 24 Oct 2019 Confirmed by Rolland Porter [phone]) on 01/17/2020 1:27:56 AM   Radiology DG Chest Port 1 View  Result Date: 01/17/2020 CLINICAL DATA:  Shortness of breath for 2 weeks EXAM: PORTABLE CHEST 1 VIEW COMPARISON:  10/23/2019 FINDINGS: Postsurgical changes in the cervical spine. Right greater than left ground-glass opacities and consolidations. Dense consolidation at the left base. No pleural effusion. Normal heart size. No pneumothorax. IMPRESSION: Bilateral ground-glass opacities and consolidations, suspect for bilateral pneumonia. There may be a component of underlying  pulmonary interstitial edema. Electronically Signed   By: Donavan Foil M.D.   On: 01/17/2020 01:58    Procedures .Critical Care Performed by: Rolland Porter, MD Authorized by: Rolland Porter, MD   Critical care provider statement:    Critical care time (minutes):  35   Critical care was necessary to treat or prevent imminent or life-threatening deterioration of the following conditions:  Respiratory failure   Critical care was time spent personally by me on the following activities:  Discussions with consultants, examination of patient, obtaining history from patient or surrogate, ordering and review of radiographic studies, pulse oximetry, ordering and review of laboratory studies, re-evaluation of patient's condition and review of old charts   (including critical care time)  Medications Ordered in ED Medications  albuterol (PROVENTIL) (2.5 MG/3ML) 0.083% nebulizer solution 5 mg (5 mg Nebulization Not Given 01/17/20 0116)  potassium chloride SA (KLOR-CON) CR tablet 40 mEq (has no administration in time range)  furosemide (LASIX) injection 60 mg (60 mg Intravenous Given 01/17/20 0141)  nitroGLYCERIN (NITROGLYN) 2 % ointment 0.5 inch (0.5 inches Topical Given 01/17/20 0140)    ED Course  I have reviewed the triage vital signs and the nursing notes.  Pertinent labs & imaging results that were available during my care of the patient were reviewed by me and considered in my medical decision making (see chart for details).    MDM Rules/Calculators/A&P                      Patient was given albuterol 6 puffs in the ED by respiratory therapy.  She was given 60 mg of Lasix IV.  She had diastolic hypertension and she was given 1/2 inch of nitroglycerin for her presumed congestive heart failure and her diastolic hypertension.  Her systolic blood pressure was not very elevated.  Recheck at 2:40 AM patient states she is feeling better.  She appears to be much less tachypneic.  She is able to speak now in  fuller sentences.  Her pure wick is not working because she has totally filled the canister so she has had over 1000 cc urinary output.  When I listen to her however she still has some rales at the bases.  We reviewed her test results and she may have pneumonia however I feel like her exam was most consistent with congestive heart failure.  She is agreeable for admission.  Patient states she feels like she can take oral potassium now.  3:08 AM Dr Humphrey Rolls, hospitalist will admit.   Final Clinical Impression(s) / ED Diagnoses Final diagnoses:  Acute respiratory distress  Hypoxia  Acute congestive heart failure, unspecified heart failure type [facility])    Rx / DC Orders  Plan admission  Rolland Porter, MD, Barbette Or, MD 01/17/20 [phone]

## 2020-01-17 NOTE — H&P (Signed)
History and Physical    Linda English [id number] DOB: 1956/01/06 DOA: 01/17/2020  PCP: [name], MD (Confirm with patient/family/NH records and if not entered, this has to be entered at [facility] point of entry) Patient coming from: Home  I have personally briefly reviewed patient's old medical records in [place]  Chief Complaint: Dyspnea  HPI: Linda English is a 64 y.o. female with medical history significant of hypertension, GERD, diabetes mellitus, irritable bowel syndrome and rectal cancer presented to ED for worsening shortness of breath.  Patient states that her breathing difficulty started gradually about 2 weeks ago and continue to worsen.  Patient states that she is having severe difficulty with breathing during the night also noted some swelling in both of her lower extremities.  Patient states that he came to the hospital in February with the same symptoms but no cardiac work-up done at that time and she was not given any treatment while according to the medical records she had cardiac echocardiogram done October 25, 2019 and it showed ejection fraction of 55 to 60%.  Patient also admits of having some dry cough but denies fever, chills, chest pain, vomiting, abdominal pain and urinary symptoms.  When asked about the home medications, patient states that her daughter adjusts her medications as needed and gave her all the medications which she needs.  Patient denies smoking, alcohol and illicit drugs  ED Course: Arrival to the ED patient had temperature of 97.6, blood pressure 123/104, heart rate 109, respiratory rate 19 and oxygen saturation was low and Patient needed 4 L of oxygen with nasal cannula to maintain oxygen saturation within normal limits.  Blood work showed WBC 6.4 hemoglobin 9.4, sodium 142, potassium, BUN 18, creatinine 1.01, blood glucose 156 and BNP 939 showed bilateral groundglass opacities and consolidation with suspected underlying pulmonary  interstitial edema.  Given 1 dose of IV Lasix 60 mg in the ED and topical nitroglycerin.  Patient was also given potassium chloride.  Review of Systems: As per HPI otherwise 10 point review of systems negative.  Unacceptable ROS statements: "10 systems reviewed," "Extensive" (without elaboration).  Acceptable ROS statements: "All others negative," "All others reviewed and are negative," and "All others unremarkable," with at [place] documented Can't double dip - if using for HPI can't use for ROS  Past Medical History:  Diagnosis Date  . Acid reflux   . Allergic rhinitis   . Anxiety   . Arthritis    osteoarthritis  . Bipolar disorder ([place])   . Chronic headache   . Depression   . Diverticula of colon   . DM (diabetes mellitus) ([place])   . Gastric erosions   . Gastric polyps    benign   . Hemorrhoids   . Hiatal hernia   . Hypertension   . IBS (irritable bowel syndrome)   . Lichen planus   . Obstructive sleep apnea   . Panic disorder   . Parkinson's disease ([place])   . Rectal cancer ([place]) 2003   ileostomy and reversal  . Subdural hematoma ([place])   . Tubular adenoma     Past Surgical History:  Procedure Laterality Date  . ABDOMINAL HYSTERECTOMY    . APPENDECTOMY    . BACK SURGERY    . BIOPSY  10/01/2019   Procedure: BIOPSY;  Surgeon: Daneil Dolin, MD;  Location: [facility];  Service: Endoscopy;;  gastric  . CESAREAN SECTION     X  2  . CHOLECYSTECTOMY    .  COLONOSCOPY  08/2007   DR Gala Romney, friable anal canal hemorrhoids, surgical anastomosis at 3cm, distal scattered tics  . COLONOSCOPY  12/15/2010   anal papilla and internal hemorrhoids/diminutive polyp in the base of the cecum. Past, tubular adenoma.. Next colonoscopy due in April 2017.  Marland Kitchen COLONOSCOPY  10/27/2005   RMR: Anal canal hemorrhoids. Surgical anastomosis at 3 cm from the anal verge appeared normal. Few scattered distal diverticula. The residual  colonic mucosa appeared normal. I suspect the patient bled  from hemorrhoids  . COLONOSCOPY N/A 01/15/2015   [id number] residual rectum and colon. next tcs 12/2019  . ESOPHAGEAL DILATION N/A 06/27/2015   Procedure: ESOPHAGEAL DILATION;  Surgeon: Daneil Dolin, MD;  Location[facility];  Service: Endoscopy;  Laterality: N/A;  . ESOPHAGOGASTRODUODENOSCOPY  05/2002   DR ROURK, normal  . ESOPHAGOGASTRODUODENOSCOPY  08/03/2011   RMR: small HH/ gastric polyps  . ESOPHAGOGASTRODUODENOSCOPY N/A 06/27/2015   Dr. Gala Romney: Mild erosive reflux esophagitits. Status post passage of a Maloney dilator. Hiatal hernia. Gastric polyps and abnormal gastric mucosa of doubtful clinical significane. status post biopsy, benign fundic gland polyp, negative H.pylori  . ESOPHAGOGASTRODUODENOSCOPY (EGD) WITH PROPOFOL N/A 10/01/2019   rourk: Normal esophagus status post esophageal dilation for dysphagia, erythematous mucosa in the stomach biopsy consistent with reactive gastropathy, no H. pylori, multiple gastric polyps (biopsy fundic gland)  . low anterior rection  2003   RECTAL CANCER  . MALONEY DILATION N/A 10/01/2019   Procedure: Venia Minks DILATION;  Surgeon: Daneil Dolin, MD;  [facility];  Service: Endoscopy;  Laterality: N/A;  . SPINE SURGERY  2001   L5,S1 HEMILAMINOTOMY AND DISCECTOMY/DR DEATON     reports that she has never smoked. She has never used smokeless tobacco. She reports that she does not drink alcohol or use drugs.  Allergies  Allergen Reactions  . Shellfish Allergy Anaphylaxis  . Lithium Nausea And Vomiting  . Excedrin Extra Strength [Aspirin-Acetaminophen-Caffeine] Other (See Comments)    Makes her nervous.  Bobbye Charleston [Clonazepam] Other (See Comments)    hallucinations    Family History  Problem Relation Age of Onset  . Colon cancer Paternal Grandfather 56  . Colon polyps Brother   . Colon polyps Cousin   . Diabetes Mother   . Hypertension Mother   . Hypertension Father   . Heart attack Father   . Heart failure Father      Unacceptable: Noncontributory, unremarkable, or negative. Acceptable: (example)Family history negative for heart disease  Prior to Admission medications   Medication Sig Start Date End Date Taking? Authorizing Provider  ALPRAZolam Duanne Moron) 0.5 MG tablet Take 1 tablet (0.5 mg total) by mouth 3 (three) times daily as needed for anxiety or sleep. 07/03/19   Roxan Hockey, MD  diltiazem (CARDIZEM CD) 120 MG 24 hr capsule Take 1 capsule (120 mg total) by mouth daily. 10/26/19   Orson Eva, MD  dronabinol (MARINOL) 2.5 MG capsule Take 1 capsule (2.5 mg total) by mouth 2 (two) times daily before lunch and supper. 11/13/19   Annitta Needs, NP  folic acid (FOLVITE) 1 MG tablet Take 1 tablet (1 mg total) by mouth daily. 11/13/19   Annitta Needs, NP  insulin aspart (NOVOLOG) 100 UNIT/ML injection insulin aspart (novoLOG) injection 0-9 Units 0-9 Units, Subcutaneous, 3 times daily with meals, CBG < 70: Implement Hypoglycemia Standing Orders and refer to Hypoglycemia Standing Orders sidebar report  CBG 70 - 120: 0 units CBG 121 - 150: 1 unit CBG 151 - 200:  2 units CBG 201 - 250: 3 units CBG 251 - 300: 5 units CBG 301 - 350: 7 units CBG 351 - 400: 9 units CBG > 400: 07/03/19 07/02/20  Roxan Hockey, MD  lamoTRIgine (LAMICTAL) 200 MG tablet Take 200 mg by mouth 2 (two) times daily.  06/11/13   [provider]  linaclotide (LINZESS) 72 MCG capsule Take 72 mcg by mouth daily before breakfast.    [provider]  metoprolol tartrate (LOPRESSOR) 25 MG tablet Take 1 tablet (25 mg total) by mouth 2 (two) times daily. 07/03/19   Roxan Hockey, MD  midodrine (PROAMATINE) 10 MG tablet Take 10 mg by mouth 3 (three) times daily. 09/29/19   [provider]  ondansetron (ZOFRAN) 4 MG tablet Take 1 tablet (4 mg total) by mouth 3 (three) times daily before meals. Patient taking differently: Take 4 mg by mouth as needed.  09/25/19   Carlis Stable, NP  pantoprazole (PROTONIX) 40 MG tablet Take 40 mg by  mouth 2 (two) times daily before a meal.    [provider]  Powders (VAGISIL DEODORANT) POWD Apply 1 application topically daily as needed (for skin irritation).    [provider]  thiamine 100 MG tablet Take 1 tablet (100 mg total) by mouth daily. 11/13/19   Annitta Needs, NP  vitamin B-12 (CYANOCOBALAMIN) 500 MCG tablet Take 1 tablet (500 mcg total) by mouth daily. 11/13/19   Annitta Needs, NP    Physical Exam: Vitals:   01/17/20 0300 01/17/20 0330 01/17/20 0400 01/17/20 0500  BP: 115/76 (!) 148/87 (!) 156/97 (!) 159/98  Pulse: (!) 103 97 94 93  Resp: (!) 28 12 19 17   Temp:    98 F (36.7 C)  TempSrc:    Oral  SpO2: 100% 100% 100% 100%  Weight:    49.3 kg  Height:    5\' 1"  (1.549 m)    Constitutional: NAD, calm, comfortable Vitals:   01/17/20 0300 01/17/20 0330 01/17/20 0400 01/17/20 0500  BP: 115/76 (!) 148/87 (!) 156/97 (!) 159/98  Pulse: (!) 103 97 94 93  Resp: (!) 28 12 19 17   Temp:    98 F (36.7 C)  TempSrc:    Oral  SpO2: 100% 100% 100% 100%  Weight:    49.3 kg  Height:    5\' 1"  (1.549 m)    General: Patient is a 64 year old cachectic African-American female who looks sick and weak. Eyes: PERRL, lids and conjunctivae normal ENMT: Mucous membranes are moist. Posterior pharynx clear of any exudate or lesions.Normal dentition.  Neck: normal, supple, no masses, no thyromegaly Respiratory: Patient is on 4 L of oxygen with nasal cannula.  Bilateral lower lobes crackles on auscultation but no wheezing or rhonchi.   Cardiovascular: Regular rate and rhythm, no murmurs / rubs / gallops. No extremity edema. 2+ pedal pulses. No carotid bruits.  Abdomen: no tenderness, no masses palpated. No hepatosplenomegaly. Bowel sounds positive.  Musculoskeletal: no clubbing / cyanosis. No joint deformity upper and lower extremities. Good ROM, no contractures. Normal muscle tone.  Skin: no rashes, lesions, ulcers. No induration Neurologic: CN 2-12 grossly intact.  Sensation intact, DTR normal. Strength 5/5 in all 4.  Psychiatric: Normal judgment and insight. Alert and oriented x 3. Normal mood.   (Anything < 9 systems with 2 bullets each down codes to level 1) (If patient refuses exam can't bill higher level) (Make sure to document decubitus ulcers present on admission -- if possible -- and whether  patient has chronic indwelling catheter at time of admission)  Labs on Admission: I have personally reviewed following labs and imaging studies  CBC: Recent Labs  Lab 01/17/20 0126  WBC 6.4  NEUTROABS 4.3  HGB 9.4*  HCT 31.3*  MCV 92.1  PLT [id number]*   Basic Metabolic Panel: Recent Labs  Lab 01/17/20 0126  NA 142  K 3.0*  CL 110  CO2 24  GLUCOSE 156*  BUN 18  CREATININE 1.08*  CALCIUM 8.6*   GFR: Estimated Creatinine Clearance: 40.8 mL/min (A) (by C-G formula based on SCr of 1.08 mg/dL (H)). Liver Function Tests: Recent Labs  Lab 01/17/20 0126  AST 121*  ALT 87*  ALKPHOS 161*  BILITOT 0.4  PROT 6.2*  ALBUMIN 3.3*   No results for input(s): LIPASE, AMYLASE in the last 168 hours. No results for input(s): AMMONIA in the last 168 hours. Coagulation Profile: No results for input(s): INR, PROTIME in the last 168 hours. Cardiac Enzymes: No results for input(s): CKTOTAL, CKMB, CKMBINDEX, TROPONINI in the last 168 hours. BNP (last 3 results) No results for input(s): PROBNP in the last 8760 hours. HbA1C: No results for input(s): HGBA1C in the last 72 hours. CBG: No results for input(s): GLUCAP in the last 168 hours. Lipid Profile: No results for input(s): CHOL, HDL, LDLCALC, TRIG, CHOLHDL, LDLDIRECT in the last 72 hours. Thyroid Function Tests: No results for input(s): TSH, T4TOTAL, FREET4, T3FREE, THYROIDAB in the last 72 hours. Anemia Panel: No results for input(s): VITAMINB12, FOLATE, FERRITIN, TIBC, IRON, RETICCTPCT in the last 72 hours. Urine analysis:    Component Value Date/Time   COLORURINE YELLOW 10/23/2019 1610    APPEARANCEUR HAZY (A) 10/23/2019 1610   LABSPEC 1.016 10/23/2019 1610   PHURINE 5.0 10/23/2019 1610   GLUCOSEU NEGATIVE 10/23/2019 1610   HGBUR SMALL (A) 10/23/2019 1610   BILIRUBINUR NEGATIVE 10/23/2019 1610   KETONESUR NEGATIVE 10/23/2019 1610   PROTEINUR 30 (A) 10/23/2019 1610   UROBILINOGEN 1.0 06/07/2012 2238   NITRITE NEGATIVE 10/23/2019 1610   LEUKOCYTESUR TRACE (A) 10/23/2019 1610    Radiological Exams on Admission: DG Chest Port 1 View  Result Date: 01/17/2020 CLINICAL DATA:  Shortness of breath for 2 weeks EXAM: PORTABLE CHEST 1 VIEW COMPARISON:  10/23/2019 FINDINGS: Postsurgical changes in the cervical spine. Right greater than left ground-glass opacities and consolidations. Dense consolidation at the left base. No pleural effusion. Normal heart size. No pneumothorax. IMPRESSION: Bilateral ground-glass opacities and consolidations, suspect for bilateral pneumonia. There may be a component of underlying pulmonary interstitial edema. Electronically Signed   By: Donavan Foil M.D.   On: 01/17/2020 01:58      Assessment/Plan Principal Problem:   Exertional dyspnea Patient will of 939 with chest x-ray showing pulmonary vascular congestion. Patient was also complaining of exertional dyspnea and orthopnea. IV Lasix 60mg  given in the ED and patient produces about 1000 mL of urine with improvement in her symptoms.  IV Lasix 40 mg daily ordered. Last echocardiogram was done in October 25, 2019 and ejection fraction 55 to 60% Although the medication reconciliation not done by pharmacy yet but there was no diuretic in her home medications. Cardiology consult ordered for evaluation and further recommendations.  Active Problems:   Essential hypertension Patient had a blood pressure of 123/104 on arrival to the hospital that improved at 140/78 during my evaluation. Patient will be started on home medications after reconciliation by pharmacy.    Hypokalemia Potassium on arrival to  the hospital was 3.0 and patient was  given 40 mEq of potassium chloride.  Continue to monitor potassium level and replete potassium as needed.   Hyperglycemia Low-dose sliding scale insulin ordered. Blood glucose monitoring and hypoglycemic protocol in place  Home medications will be started after reconciliation done by pharmacy.    DVT prophylaxis: Heparin Code Status: Full code Family Communication: No family member present at bedside Disposition Plan:  Consults called: Cardiology consult ordered Admission status: Observation/telemetry   Edmonia Lynch MD [facility] Pager [phone]   If 7PM-7AM, please contact night-coverage [email or web address] Password   01/17/2020, 8:09 AM

## 2020-01-17 NOTE — Care Management Obs Status (Signed)
[place] NOTIFICATION   Patient Details  Name: Linda English MRN: [id number] Date of Birth: 10-15-1955   Medicare Observation Status Notification Given:  Yes    Tommy Medal 01/17/2020, 3:22 PM

## 2020-01-17 NOTE — Plan of Care (Signed)
  Problem: Education: Goal: Knowledge of General Education information will improve Description: Including pain rating scale, medication(s)/side effects and non-pharmacologic comfort measures Outcome: Progressing   Problem: Health Behavior/Discharge Planning: Goal: Ability to manage health-related needs will improve Outcome: Progressing   Problem: Clinical Measurements: Goal: Ability to maintain clinical measurements within normal limits will improve Outcome: Progressing Goal: Respiratory complications will improve Outcome: Progressing   Problem: Activity: Goal: Risk for activity intolerance will decrease Outcome: Progressing   Problem: Nutrition: Goal: Adequate nutrition will be maintained Outcome: Progressing   Problem: Safety: Goal: Ability to remain free from injury will improve Outcome: Progressing   Problem: Skin Integrity: Goal: Risk for impaired skin integrity will decrease Outcome: Progressing

## 2020-01-18 ENCOUNTER — Observation Stay (HOSPITAL_COMMUNITY): Payer: Medicare Other

## 2020-01-18 DIAGNOSIS — Z794 Long term (current) use of insulin: Secondary | ICD-10-CM | POA: Diagnosis not present

## 2020-01-18 DIAGNOSIS — R079 Chest pain, unspecified: Secondary | ICD-10-CM | POA: Diagnosis not present

## 2020-01-18 DIAGNOSIS — F410 Panic disorder [episodic paroxysmal anxiety] without agoraphobia: Secondary | ICD-10-CM | POA: Diagnosis present

## 2020-01-18 DIAGNOSIS — R209 Unspecified disturbances of skin sensation: Secondary | ICD-10-CM

## 2020-01-18 DIAGNOSIS — R0600 Dyspnea, unspecified: Secondary | ICD-10-CM | POA: Diagnosis not present

## 2020-01-18 DIAGNOSIS — E1165 Type 2 diabetes mellitus with hyperglycemia: Secondary | ICD-10-CM | POA: Diagnosis present

## 2020-01-18 DIAGNOSIS — E785 Hyperlipidemia, unspecified: Secondary | ICD-10-CM | POA: Diagnosis present

## 2020-01-18 DIAGNOSIS — E876 Hypokalemia: Secondary | ICD-10-CM | POA: Diagnosis present

## 2020-01-18 DIAGNOSIS — Z90710 Acquired absence of both cervix and uterus: Secondary | ICD-10-CM | POA: Diagnosis not present

## 2020-01-18 DIAGNOSIS — N1831 Chronic kidney disease, stage 3a: Secondary | ICD-10-CM | POA: Diagnosis present

## 2020-01-18 DIAGNOSIS — Z85048 Personal history of other malignant neoplasm of rectum, rectosigmoid junction, and anus: Secondary | ICD-10-CM | POA: Diagnosis not present

## 2020-01-18 DIAGNOSIS — R0603 Acute respiratory distress: Secondary | ICD-10-CM | POA: Diagnosis present

## 2020-01-18 DIAGNOSIS — G20 Parkinson's disease: Secondary | ICD-10-CM | POA: Diagnosis present

## 2020-01-18 DIAGNOSIS — E1122 Type 2 diabetes mellitus with diabetic chronic kidney disease: Secondary | ICD-10-CM | POA: Diagnosis present

## 2020-01-18 DIAGNOSIS — I10 Essential (primary) hypertension: Secondary | ICD-10-CM | POA: Diagnosis not present

## 2020-01-18 DIAGNOSIS — I130 Hypertensive heart and chronic kidney disease with heart failure and stage 1 through stage 4 chronic kidney disease, or unspecified chronic kidney disease: Secondary | ICD-10-CM | POA: Diagnosis present

## 2020-01-18 DIAGNOSIS — R1310 Dysphagia, unspecified: Secondary | ICD-10-CM | POA: Diagnosis present

## 2020-01-18 DIAGNOSIS — J9601 Acute respiratory failure with hypoxia: Secondary | ICD-10-CM

## 2020-01-18 DIAGNOSIS — Z20822 Contact with and (suspected) exposure to covid-19: Secondary | ICD-10-CM | POA: Diagnosis present

## 2020-01-18 DIAGNOSIS — Z8673 Personal history of transient ischemic attack (TIA), and cerebral infarction without residual deficits: Secondary | ICD-10-CM | POA: Diagnosis not present

## 2020-01-18 DIAGNOSIS — E1142 Type 2 diabetes mellitus with diabetic polyneuropathy: Secondary | ICD-10-CM | POA: Diagnosis present

## 2020-01-18 DIAGNOSIS — R0902 Hypoxemia: Secondary | ICD-10-CM | POA: Diagnosis present

## 2020-01-18 DIAGNOSIS — R0602 Shortness of breath: Secondary | ICD-10-CM | POA: Diagnosis not present

## 2020-01-18 DIAGNOSIS — I5031 Acute diastolic (congestive) heart failure: Secondary | ICD-10-CM | POA: Diagnosis present

## 2020-01-18 DIAGNOSIS — Z79899 Other long term (current) drug therapy: Secondary | ICD-10-CM | POA: Diagnosis not present

## 2020-01-18 DIAGNOSIS — F319 Bipolar disorder, unspecified: Secondary | ICD-10-CM | POA: Diagnosis present

## 2020-01-18 LAB — BASIC METABOLIC PANEL
Anion gap: 13 (ref 5–15)
BUN: 19 mg/dL (ref 8–23)
CO2: 25 mmol/L (ref 22–32)
Calcium: 9.2 mg/dL (ref 8.9–10.3)
Chloride: 104 mmol/L (ref 98–111)
Creatinine, Ser: 0.94 mg/dL (ref 0.44–1.00)
GFR calc Af Amer: >60 mL/min (ref >60)
GFR calc non Af Amer: >60 mL/min (ref >60)
Glucose, Bld: 100 mg/dL — ABNORMAL HIGH (ref 70–99)
Potassium: 3.6 mmol/L (ref 3.5–5.1)
Sodium: 142 mmol/L (ref 135–145)

## 2020-01-18 LAB — CBC
HCT: 31.3 % — ABNORMAL LOW (ref 36.0–46.0)
Hemoglobin: 9.7 g/dL — ABNORMAL LOW (ref 12.0–15.0)
MCH: 27.7 pg (ref 26.0–34.0)
MCHC: 31 g/dL (ref 30.0–36.0)
MCV: 89.4 fL (ref 80.0–100.0)
Platelets: 385 10*3/uL (ref 150–400)
RBC: 3.5 MIL/uL — ABNORMAL LOW (ref 3.87–5.11)
RDW: 15 % (ref 11.5–15.5)
WBC: 4.6 10*3/uL (ref 4.0–10.5)
nRBC: 0 % (ref 0.0–0.2)

## 2020-01-18 LAB — IRON AND TIBC
Iron: 28 ug/dL (ref 28–170)
Saturation Ratios: 7 % — ABNORMAL LOW (ref 10.4–31.8)
TIBC: 393 ug/dL (ref 250–450)
UIBC: 365 ug/dL

## 2020-01-18 LAB — GLUCOSE, CAPILLARY
Glucose-Capillary: 96 mg/dL (ref 70–99)
Glucose-Capillary: 89 mg/dL (ref 70–99)
Glucose-Capillary: 76 mg/dL (ref 70–99)
Glucose-Capillary: 125 mg/dL — ABNORMAL HIGH (ref 70–99)

## 2020-01-18 LAB — D-DIMER, QUANTITATIVE: D-Dimer, Quant: 0.38 ug/mL-FEU (ref 0.00–0.50)

## 2020-01-18 LAB — FOLATE: Folate: 53.6 ng/mL (ref >5.9)

## 2020-01-18 LAB — TSH: TSH: 2.23 u[IU]/mL (ref 0.350–4.500)

## 2020-01-18 LAB — FERRITIN: Ferritin: 32 ng/mL (ref 11–307)

## 2020-01-18 LAB — MAGNESIUM: Magnesium: 2.1 mg/dL (ref 1.7–2.4)

## 2020-01-18 LAB — VITAMIN B12: Vitamin B-12: 432 pg/mL (ref 180–914)

## 2020-01-18 MED ORDER — ACETAMINOPHEN 500 MG PO TABS
1000.0000 mg | ORAL_TABLET | Freq: Four times a day (QID) | ORAL | Status: DC | PRN
  Administered 2020-01-18: 1000 mg via ORAL
  Filled 2020-01-18: qty 2

## 2020-01-18 MED ORDER — METOPROLOL SUCCINATE ER 50 MG PO TB24
50.0000 mg | ORAL_TABLET | Freq: Every day | ORAL | Status: DC
  Administered 2020-01-18 – 2020-01-19 (×2): 50 mg via ORAL
  Filled 2020-01-18 (×2): qty 1

## 2020-01-18 MED ORDER — ASPIRIN 81 MG PO CHEW
81.0000 mg | CHEWABLE_TABLET | Freq: Every day | ORAL | Status: DC
  Administered 2020-01-18 – 2020-01-19 (×2): 81 mg via ORAL
  Filled 2020-01-18 (×2): qty 1

## 2020-01-18 MED ORDER — FUROSEMIDE 40 MG PO TABS
40.0000 mg | ORAL_TABLET | Freq: Every day | ORAL | Status: DC
  Administered 2020-01-19: 40 mg via ORAL
  Filled 2020-01-18: qty 1

## 2020-01-18 NOTE — Progress Notes (Signed)
PROGRESS NOTE  Linda English [id number] DOB: Nov 28, 1955 DOA: 01/17/2020 PCP: [name], MD  Brief History:  64 year old female with a history of diabetes mellitus type 2, bipolar 1 disorder, TIA, subdural hematoma, hyperlipidemia, hypertension, GERD, rectal cancer status post LOA 2003, chronic headaches presenting with 3-day history of shortness of breath and 1 week history of worsening lower extremity edema.  The patient denied any fevers, chills, hemoptysis, nausea, vomiting, diarrhea, abdominal pain.  She does describe some orthopnea type symptoms having to prop herself up to sleep.  She does not smoke.  She denies any dysuria, hematuria.  She states that her swallowing and dysphagia have improved since starting Marinol.  In the emergency department, the patient was afebrile hemodynamically stable with oxygen saturation 98% on room air.  WBC 6.4, hemoglobin 9.4, platelets 122,000.  Patient was given albuterol and 60 mg IV furosemide.  Chest x-ray showed bilateral groundglass opacities and consolidation.  Assessment/Plan: Acute diastolic CHF -[id number] echo EF 55-60%, indeterminate diastolic function -Continue IV furosemide -Accurate I's andOs--NEG 3.6L -Daily weights -5/21/21Repeat chest x-ray-personally reviewed--improved infiltrates  Sensory Disturbance -numbness/tingling in bilateral finger tips and tongue/lips -MR brain -check B12 -folate -TSH  Essential hypertension -Continue metoprolol tartrate and diltiazem  CKD stage IIIa -Baseline creatinine 0.9-1.2 -Monitor with diuresis  SVT -Currently in sinus rhythm -Continue metoprolol and diltiazem  Hypokalemia -Replete -Check magnesium  Diabetes mellitus type 2, controlled -NovoLog -We will check hemoglobin A1c  Nausea and vomiting -Patient has had an extensive GI work-up -She was [facility] at [facility] 12/10/[facility]--unclear etiology, but improved with Marinol -Continue Marinol  Bipolar  disorder -Continue Lamictal -Continue home dose alprazolam  LE Edema/pain -venous duplex    Status is: Inpatient--requiring IV lasix diuresis  The patient will require care spanning > 2 midnights and should be moved to inpatient because: IV treatments appropriate due to intensity of illness or inability to take PO;  Requiring IV lasix  Dispo: The patient is from: Home  Anticipated d/c is to: Home  Anticipated d/c date is: 5/22 if MR brain neg and euvolemic  Patient currently is not medically stable to d/c.        Family Communication: no  Family at bedside  Consultants:  none  Code Status:  FULL   DVT Prophylaxis:  [place] Heparin   Procedures: As Listed in Progress Note Above  Antibiotics: None    Total time spent 35 minutes.  Greater than 50% spent face to face counseling and coordinating care.    Subjective: Patient complains of numbness and tingling of bilateral finger tips and tongue and lips.  Has occurred off and on x over 2 months.  Denies focal extremity weakness, dysarthria, visual disturbance, word finding difficulty, dysphasia.  She is breathing better.    Objective: Vitals:   01/18/20 0500 01/18/20 0521 01/18/20 0932 01/18/20 1423  BP:  (!) 157/87  139/83  Pulse:  97  100  Resp:  16 18 18   Temp:  98.6 F (37 C)  98.3 F (36.8 C)  TempSrc:  Oral    SpO2:  100% 98% 100%  Weight: 48.3 kg     Height:        Intake/Output Summary (Last 24 hours) at 01/18/2020 1631 Last data filed at 01/18/2020 1500 Gross per 24 hour  Intake 720 ml  Output 200 ml  Net 520 ml   Weight change: -2.957 kg Exam:   General:  Pt is alert, follows commands appropriately, not  in acute distress  HEENT: No icterus, No thrush, No neck mass, [place]/AT  Cardiovascular: RRR, S1/S2, no rubs, no gallops  Respiratory: CTA bilaterally, no wheezing, no crackles, no rhonchi  Abdomen: Soft/+BS, non tender, non distended, no  guarding  Extremities: No edema, No lymphangitis, No petechiae, No rashes, no synovitis  Neuro:  CN II-XII intact, strength 4/5 in RUE, RLE, strength 4/5 LUE, LLE; sensation intact bilateral; no dysmetria; babinski equivocal     Data Reviewed: I have personally reviewed following labs and imaging studies Basic Metabolic Panel: Recent Labs  Lab 01/17/20 0126 01/18/20 0504  NA 142 142  K 3.0* 3.6  CL 110 104  CO2 24 25  GLUCOSE 156* 100*  BUN 18 19  CREATININE 1.08* 0.94  CALCIUM 8.6* 9.2  MG  --  2.1   Liver Function Tests: Recent Labs  Lab 01/17/20 0126  AST 121*  ALT 87*  ALKPHOS 161*  BILITOT 0.4  PROT 6.2*  ALBUMIN 3.3*   No results for input(s): LIPASE, AMYLASE in the last 168 hours. No results for input(s): AMMONIA in the last 168 hours. Coagulation Profile: No results for input(s): INR, PROTIME in the last 168 hours. CBC: Recent Labs  Lab 01/17/20 0126 01/18/20 0504  WBC 6.4 4.6  NEUTROABS 4.3  --   HGB 9.4* 9.7*  HCT 31.3* 31.3*  MCV 92.1 89.4  PLT 422* 385   Cardiac Enzymes: No results for input(s): CKTOTAL, CKMB, CKMBINDEX, TROPONINI in the last 168 hours. BNP: Invalid input(s): POCBNP CBG: Recent Labs  Lab 01/17/20 1122 01/17/20 1628 01/17/20 2050 01/18/20 0734 01/18/20 1130  GLUCAP 126* 134* 112* 96 89   HbA1C: Recent Labs    01/17/20 0301  HGBA1C 5.0   Urine analysis:    Component Value Date/Time   COLORURINE YELLOW 10/23/2019 1610   APPEARANCEUR HAZY (A) 10/23/2019 1610   LABSPEC 1.016 10/23/2019 1610   PHURINE 5.0 10/23/2019 1610   GLUCOSEU NEGATIVE 10/23/2019 1610   HGBUR SMALL (A) 10/23/2019 1610   BILIRUBINUR NEGATIVE 10/23/2019 1610   KETONESUR NEGATIVE 10/23/2019 1610   PROTEINUR 30 (A) 10/23/2019 1610   UROBILINOGEN 1.0 06/07/2012 2238   NITRITE NEGATIVE 10/23/2019 1610   LEUKOCYTESUR TRACE (A) 10/23/2019 1610   Sepsis Labs: @LABRCNTIP (procalcitonin:4,lacticidven:4) ) Recent Results (from the past 240  hour(s))  SARS Coronavirus 2 by RT PCR (hospital order, performed in [place] hospital lab) Nasopharyngeal Nasopharyngeal Swab     Status: None   Collection Time: 01/17/20  1:14 AM   Specimen: Nasopharyngeal Swab  Result Value Ref Range Status   SARS Coronavirus 2 NEGATIVE NEGATIVE Final    Comment: (NOTE) SARS-CoV-2 target nucleic acids are NOT DETECTED. The SARS-CoV-2 RNA is generally detectable in upper and lower respiratory specimens during the acute phase of infection. The lowest concentration of SARS-CoV-2 viral copies this assay can detect is 250 copies / mL. A negative result does not preclude SARS-CoV-2 infection and should not be used as the sole basis for treatment or other patient management decisions.  A negative result may occur with improper specimen collection / handling, submission of specimen other than nasopharyngeal swab, presence of viral mutation(s) within the areas targeted by this assay, and inadequate number of viral copies (<250 copies / mL). A negative result must be combined with clinical observations, patient history, and epidemiological information. Fact Sheet for Patients:   StrictlyIdeas.no Fact Sheet for Healthcare Providers: BankingDealers.co.za This test is not yet approved or cleared  by the [place] FDA and has been  authorized for detection and/or diagnosis of SARS-CoV-2 by FDA under an Emergency Use Authorization (EUA).  This EUA will remain in effect (meaning this test can be used) for the duration of the COVID-19 declaration under Section 564(b)(1) of the Act, 21 U.S.C. section 360bbb-3(b)(1), unless the authorization is terminated or revoked sooner. Performed at [facility], [address] [place], [place], [place] [address]      Scheduled Meds: . furosemide  40 mg Intravenous Daily  . heparin  5,000 Units Subcutaneous Q8H  . insulin aspart  0-5 Units Subcutaneous QHS  . insulin aspart  0-9  Units Subcutaneous TID WC  . potassium chloride  20 mEq Oral Daily  . sodium chloride flush  3 mL Intravenous Q12H   Continuous Infusions: . sodium chloride      Procedures/Studies: DG Chest 2 View  Result Date: 01/18/2020 CLINICAL DATA:  Shortness of breath.  Chest pain with inspiration. EXAM: CHEST - 2 VIEW COMPARISON:  01/17/2020 FINDINGS: Marked radiographic improvement. Only minimal residual interstitial edema. The lungs are largely clear. Heart size upper limits of normal. Aortic atherosclerosis. No visible effusion. IMPRESSION: Marked radiographic improvement with near complete resolution of pulmonary edema. Electronically Signed   By: Nelson Chimes M.D.   On: 01/18/2020 10:09   US Venous Img Lower Bilateral (DVT)  Result Date: 01/17/2020 CLINICAL DATA:  Bilateral lower extremity pain EXAM: BILATERAL LOWER EXTREMITY VENOUS DUPLEX ULTRASOUND TECHNIQUE: Gray-scale sonography with graded compression, as well as color Doppler and duplex ultrasound were performed to evaluate the lower extremity deep venous systems from the level of the common femoral vein and including the common femoral, femoral, profunda femoral, popliteal and calf veins including the posterior tibial, peroneal and gastrocnemius veins when visible. The superficial great saphenous vein was also interrogated. Spectral Doppler was utilized to evaluate flow at rest and with distal augmentation maneuvers in the common femoral, femoral and popliteal veins. COMPARISON:  None. FINDINGS: RIGHT LOWER EXTREMITY Common Femoral Vein: No evidence of thrombus. Normal compressibility, respiratory phasicity and response to augmentation. Saphenofemoral Junction: No evidence of thrombus. Normal compressibility and flow on color Doppler imaging. Profunda Femoral Vein: No evidence of thrombus. Normal compressibility and flow on color Doppler imaging. Femoral Vein: No evidence of thrombus. Normal compressibility, respiratory phasicity and response to  augmentation. Popliteal Vein: No evidence of thrombus. Normal compressibility, respiratory phasicity and response to augmentation. Calf Veins: No evidence of thrombus. Normal compressibility and flow on color Doppler imaging. Superficial Great Saphenous Vein: No evidence of thrombus. Normal compressibility. Venous Reflux:  None. Other Findings:  None. LEFT LOWER EXTREMITY Common Femoral Vein: No evidence of thrombus. Normal compressibility, respiratory phasicity and response to augmentation. Saphenofemoral Junction: No evidence of thrombus. Normal compressibility and flow on color Doppler imaging. Profunda Femoral Vein: No evidence of thrombus. Normal compressibility and flow on color Doppler imaging. Femoral Vein: No evidence of thrombus. Normal compressibility, respiratory phasicity and response to augmentation. Popliteal Vein: No evidence of thrombus. Normal compressibility, respiratory phasicity and response to augmentation. Calf Veins: No evidence of thrombus. Normal compressibility and flow on color Doppler imaging. Superficial Great Saphenous Vein: No evidence of thrombus. Normal compressibility. Venous Reflux:  None. Other Findings:  None. IMPRESSION: No evidence of deep venous thrombosis in either lower extremity. Electronically Signed   By: Lowella Grip III M.D.   On: 01/17/2020 13:26   DG Chest Port 1 View  Result Date: 01/17/2020 CLINICAL DATA:  Shortness of breath for 2 weeks EXAM: PORTABLE CHEST 1 VIEW COMPARISON:  10/23/2019 FINDINGS: Postsurgical  changes in the cervical spine. Right greater than left ground-glass opacities and consolidations. Dense consolidation at the left base. No pleural effusion. Normal heart size. No pneumothorax. IMPRESSION: Bilateral ground-glass opacities and consolidations, suspect for bilateral pneumonia. There may be a component of underlying pulmonary interstitial edema. Electronically Signed   By: Donavan Foil M.D.   On: 01/17/2020 01:58    Orson Eva,  DO  [facility]  If 7PM-7AM, please contact night-coverage [email or web address] Password [email or web address] 01/18/2020, 4:31 PM   LOS: 0 days

## 2020-01-18 NOTE — Progress Notes (Signed)
Patient c/o fingertips to bil. hands feeling "numb." Dr. Carles Collet made aware, will continue to monitor.

## 2020-01-19 LAB — GLUCOSE, CAPILLARY
Glucose-Capillary: 105 mg/dL — ABNORMAL HIGH (ref 70–99)
Glucose-Capillary: 101 mg/dL — ABNORMAL HIGH (ref 70–99)

## 2020-01-19 LAB — BASIC METABOLIC PANEL
Anion gap: 8 (ref 5–15)
BUN: 20 mg/dL (ref 8–23)
CO2: 28 mmol/L (ref 22–32)
Calcium: 9.5 mg/dL (ref 8.9–10.3)
Chloride: 107 mmol/L (ref 98–111)
Creatinine, Ser: 0.88 mg/dL (ref 0.44–1.00)
GFR calc Af Amer: >60 mL/min (ref >60)
GFR calc non Af Amer: >60 mL/min (ref >60)
Glucose, Bld: 105 mg/dL — ABNORMAL HIGH (ref 70–99)
Potassium: 3.8 mmol/L (ref 3.5–5.1)
Sodium: 143 mmol/L (ref 135–145)

## 2020-01-19 LAB — MAGNESIUM: Magnesium: 2 mg/dL (ref 1.7–2.4)

## 2020-01-19 LAB — T4, FREE: Free T4: 1.03 ng/dL (ref 0.61–1.12)

## 2020-01-19 MED ORDER — MELATONIN 5 MG PO TABS: 10.0000 mg | ORAL_TABLET | Freq: Once | ORAL | Status: DC

## 2020-01-19 MED ORDER — FUROSEMIDE 40 MG PO TABS: 40.0000 mg | ORAL_TABLET | Freq: Every day | ORAL | 1 refills | Status: DC

## 2020-01-19 MED ORDER — FERROUS SULFATE 325 (65 FE) MG PO TABS: 325.0000 mg | ORAL_TABLET | Freq: Every day | ORAL | 3 refills | Status: DC

## 2020-01-19 MED ORDER — FERROUS SULFATE 325 (65 FE) MG PO TABS: 325.0000 mg | ORAL_TABLET | Freq: Every day | ORAL | Status: DC

## 2020-01-19 MED ORDER — METOPROLOL SUCCINATE ER 50 MG PO TB24: 50.0000 mg | ORAL_TABLET | Freq: Every day | ORAL | 1 refills | Status: AC

## 2020-01-19 MED ORDER — ASPIRIN 81 MG PO CHEW: 81.0000 mg | CHEWABLE_TABLET | Freq: Every day | ORAL | Status: DC

## 2020-01-19 MED ORDER — MELATONIN 3 MG PO TABS
9.0000 mg | ORAL_TABLET | Freq: Once | ORAL | Status: AC
  Administered 2020-01-19: 9 mg via ORAL
  Filled 2020-01-19: qty 3

## 2020-01-19 MED ORDER — POTASSIUM CHLORIDE CRYS ER 20 MEQ PO TBCR: 20.0000 meq | EXTENDED_RELEASE_TABLET | Freq: Every day | ORAL | 0 refills | Status: DC

## 2020-01-19 NOTE — Plan of Care (Signed)

## 2020-01-19 NOTE — Progress Notes (Signed)
Nsg Discharge Note  Admit Date:  01/17/2020 Discharge date: 01/19/2020   Marvene Staff Chill to be D/C'd home  per MD order.  AVS completed.  Copy for chart, and copy for patient signed, and dated. Patient/caregiver able to verbalize understanding.  Discharge Medication: Allergies as of 01/19/2020      Reactions   Shellfish Allergy Anaphylaxis   Lithium Nausea And Vomiting   Excedrin Extra Strength [aspirin-acetaminophen-caffeine] Other (See Comments)   Makes her nervous.   Klonopin [clonazepam] Other (See Comments)   hallucinations      Medication List    STOP taking these medications   metoprolol tartrate 25 MG tablet Commonly known as: LOPRESSOR     TAKE these medications   ALPRAZolam 0.5 MG tablet Commonly known as: XANAX Take 1 tablet (0.5 mg total) by mouth 3 (three) times daily as needed for anxiety or sleep.   aspirin 81 MG chewable tablet Chew 1 tablet (81 mg total) by mouth daily. Start taking on: Jan 20, 2020   diltiazem 120 MG 24 hr capsule Commonly known as: CARDIZEM CD Take 1 capsule (120 mg total) by mouth daily.   dronabinol 2.5 MG capsule Commonly known as: Marinol Take 1 capsule (2.5 mg total) by mouth 2 (two) times daily before lunch and supper.   ferrous sulfate 325 (65 FE) MG tablet Take 1 tablet (325 mg total) by mouth daily with breakfast. Start taking on: May 23, [id number]   folic acid 1 MG tablet Commonly known as: FOLVITE Take 1 tablet (1 mg total) by mouth daily.   furosemide 40 MG tablet Commonly known as: LASIX Take 1 tablet (40 mg total) by mouth daily. Start taking on: Jan 20, 2020   insulin aspart 100 UNIT/ML injection Commonly known as: NovoLOG insulin aspart (novoLOG) injection 0-9 Units 0-9 Units, Subcutaneous, 3 times daily with meals, CBG < 70: Implement Hypoglycemia Standing Orders and refer to Hypoglycemia Standing Orders sidebar report  CBG 70 - 120: 0 units CBG 121 - 150: 1 unit CBG 151 - 200: 2 units CBG 201 - 250: 3 units  CBG 251 - 300: 5 units CBG 301 - 350: 7 units CBG 351 - 400: 9 units CBG > 400: What changed:   how much to take  how to take this  when to take this  additional instructions   lamoTRIgine 200 MG tablet Commonly known as: LAMICTAL Take 200 mg by mouth 2 (two) times daily.   Linzess 72 MCG capsule Generic drug: linaclotide Take 72 mcg by mouth daily as needed (for constipation).   metoprolol succinate 50 MG 24 hr tablet Commonly known as: TOPROL-XL Take 1 tablet (50 mg total) by mouth daily. Take with or immediately following a meal. Start taking on: Jan 20, 2020   ondansetron 4 MG tablet Commonly known as: ZOFRAN Take 1 tablet (4 mg total) by mouth 3 (three) times daily before meals. What changed:   when to take this  reasons to take this   pantoprazole 40 MG tablet Commonly known as: PROTONIX Take 40 mg by mouth 2 (two) times daily before a meal.   potassium chloride SA 20 MEQ tablet Commonly known as: KLOR-CON Take 1 tablet (20 mEq total) by mouth daily. Start taking on: Jan 20, 2020   thiamine 100 MG tablet Take 1 tablet (100 mg total) by mouth daily.   vitamin B-12 500 MCG tablet Commonly known as: CYANOCOBALAMIN Take 1 tablet (500 mcg total) by mouth daily.  Discharge Assessment: Vitals:   01/18/20 2030 01/19/20 0509  BP: (!) 157/84 (!) 164/91  Pulse: (!) 102 90  Resp: 17 15  Temp: 98.8 F (37.1 C) 98.8 F (37.1 C)  SpO2: 100% 100%   Skin clean, dry and intact without evidence of skin break down, no evidence of skin tears noted. IV catheter discontinued intact. Site without signs and symptoms of complications - no redness or edema noted at insertion site, patient denies c/o pain - only slight tenderness at site.  Dressing with slight pressure applied.  D/c Instructions-Education: Discharge instructions given to patient/family with verbalized understanding. D/c education completed with patient/family including follow up instructions,  medication list, d/c activities limitations if indicated, with other d/c instructions as indicated by MD - patient able to verbalize understanding, all questions fully answered. Patient instructed to return to ED, call 911, or call MD for any changes in condition.  Patient escorted via [place], and D/C home via private auto.  Zachery Conch, RN 01/19/2020 2:55 PM

## 2020-01-19 NOTE — Discharge Summary (Addendum)
Physician Discharge Summary  Linda English [id number] DOB: 1955-09-16 DOA: 01/17/2020  PCP: [name], MD  Admit date: 01/17/2020 Discharge date: 01/19/2020  Admitted From: Home Disposition:  Home   Recommendations for Outpatient Follow-up:  1. Follow up with PCP in 1-2 weeks 2. Please obtain BMP/CBC in one week    Discharge Condition: Stable CODE STATUS: FULL Diet recommendation: Heart Healthy / Carb Modified   Brief/Interim Summary: 64 year old female with a history of diabetes mellitus type 2, bipolar 1 disorder, TIA, subdural hematoma, hyperlipidemia, hypertension, GERD,rectal cancer status post LOA 2003,chronic headaches presenting with3-day history of shortness of breath and 1 week history of worsening lower extremity edema. The patient denied any fevers, chills, hemoptysis, nausea, vomiting, diarrhea, abdominal pain. She does describe some orthopnea type symptoms having to prop herself up to sleep. She does not smoke. She denies any dysuria, hematuria. She states that her swallowing and dysphagia have improved since starting Marinol. In the emergency department, the patient was afebrile hemodynamically stable with oxygen saturation 98% on room air. WBC 6.4, hemoglobin 9.4, platelets 122,000. Patient was given albuterol and 60 mg IV furosemide. Chest x-ray showed bilateral groundglass opacities and consolidation.  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  Essential hypertension -Continue metoprolol succinate and  diltiazem  CKD stage IIIa -Baseline creatinine 0.9-1.2 -Monitor with diuresis -serum creatinine 0.88 at time of dc  SVT -Currently in sinus rhythm -Continue metoprolol and diltiazem  Hypokalemia -Replete -Check magnesium--2.0  Diabetes mellitus type 2, controlled -NovoLog -We will check hemoglobin A1c--5.0  Nausea and vomiting -Patient has had an extensive GI work-up -She was seen [facility] 12/10/19--unclear etiology, but improved with Marinol -Continue Marinol -tolerating cardiac diet  Bipolar disorder -Continue Lamictal -Continue home dose alprazolam  LE Edema/pain -venous duplex--neg    Discharge Instructions   Allergies as of 01/19/2020      Reactions   Shellfish Allergy Anaphylaxis   Lithium Nausea And Vomiting   Excedrin Extra Strength [aspirin-acetaminophen-caffeine] Other (See Comments)   Makes her nervous.   Klonopin [clonazepam] Other (See Comments)   hallucinations      Medication List    STOP taking these medications   metoprolol tartrate 25 MG tablet Commonly known as: LOPRESSOR     TAKE these medications   ALPRAZolam 0.5 MG tablet Commonly known as: XANAX Take 1 tablet (0.5 mg total) by mouth 3 (three) times daily as needed for anxiety or sleep.   aspirin 81 MG chewable tablet Chew 1 tablet (81 mg total) by mouth daily. Start taking on: Jan 20, 2020   diltiazem 120 MG 24 hr capsule Commonly known as: CARDIZEM CD Take 1 capsule (120 mg total) by mouth daily.   dronabinol 2.5 MG capsule Commonly known as: Marinol Take 1 capsule (2.5 mg total) by mouth 2 (two) times daily before lunch and supper.   folic acid 1 MG tablet Commonly known as: FOLVITE Take 1 tablet (1 mg total) by mouth daily.   furosemide 40 MG tablet Commonly known as: LASIX Take 1 tablet (40 mg total) by mouth daily. Start taking on: Jan 20, 2020   insulin aspart 100 UNIT/ML injection Commonly known as: NovoLOG insulin aspart (novoLOG) injection 0-9  Units 0-9 Units, Subcutaneous, 3 times  daily with meals, CBG < 70: Implement Hypoglycemia Standing Orders and refer to Hypoglycemia Standing Orders sidebar report  CBG 70 - 120: 0 units CBG 121 - 150: 1 unit CBG 151 - 200: 2 units CBG 201 - 250: 3 units CBG 251 - 300: 5 units CBG 301 - 350: 7 units CBG 351 - 400: 9 units CBG > 400: What changed:   how much to take  how to take this  when to take this  additional instructions   lamoTRIgine 200 MG tablet Commonly known as: LAMICTAL Take 200 mg by mouth 2 (two) times daily.   Linzess 72 MCG capsule Generic drug: linaclotide Take 72 mcg by mouth daily as needed (for constipation).   metoprolol succinate 50 MG 24 hr tablet Commonly known as: TOPROL-XL Take 1 tablet (50 mg total) by mouth daily. Take with or immediately following a meal. Start taking on: Jan 20, 2020   ondansetron 4 MG tablet Commonly known as: ZOFRAN Take 1 tablet (4 mg total) by mouth 3 (three) times daily before meals. What changed:   when to take this  reasons to take this   pantoprazole 40 MG tablet Commonly known as: PROTONIX Take 40 mg by mouth 2 (two) times daily before a meal.   potassium chloride SA 20 MEQ tablet Commonly known as: KLOR-CON Take 1 tablet (20 mEq total) by mouth daily. Start taking on: Jan 20, 2020   thiamine 100 MG tablet Take 1 tablet (100 mg total) by mouth daily.   vitamin B-12 500 MCG tablet Commonly known as: CYANOCOBALAMIN Take 1 tablet (500 mcg total) by mouth daily.       Allergies  Allergen Reactions  . Shellfish Allergy Anaphylaxis  . Lithium Nausea And Vomiting  . Excedrin Extra Strength [Aspirin-Acetaminophen-Caffeine] Other (See Comments)    Makes her nervous.  Bobbye Charleston [Clonazepam] Other (See Comments)    hallucinations    Consultations:  none   Procedures/Studies: DG Chest 2 View  Result Date: 01/18/2020 CLINICAL DATA:  Shortness of breath.  Chest pain with inspiration. EXAM: CHEST - 2 VIEW  COMPARISON:  01/17/2020 FINDINGS: Marked radiographic improvement. Only minimal residual interstitial edema. The lungs are largely clear. Heart size upper limits of normal. Aortic atherosclerosis. No visible effusion. IMPRESSION: Marked radiographic improvement with near complete resolution of pulmonary edema. Electronically Signed   By: Nelson Chimes M.D.   On: 01/18/2020 10:09   MR BRAIN WO CONTRAST  Result Date: 01/18/2020 CLINICAL DATA:  64 year old female with sensory disturbance. Hand numbness. EXAM: MRI HEAD WITHOUT CONTRAST TECHNIQUE: Multiplanar, multiecho pulse sequences of the brain and surrounding structures were obtained without intravenous contrast. COMPARISON:  Brain MRI, MRA head and neck 10/23/2019 and earlier. FINDINGS: Brain: Stable cerebral volume. No restricted diffusion to suggest acute infarction. No midline shift, mass effect, evidence of mass lesion, ventriculomegaly, extra-axial collection or acute intracranial hemorrhage. Cervicomedullary junction and pituitary are within normal limits. Pear[place] Cables and white matter signal is stable since February and largely normal throughout the brain. No cortical encephalomalacia or chronic cerebral blood products identified. Deep gray nuclei, brainstem and cerebellum are within normal limits. Vascular: Major intracranial vascular flow voids are stable since February. Skull and upper cervical spine: Normal for age visible cervical spine. Visualized bone marrow signal is within normal limits. Sinuses/Orbits: Stable and negative. Other: Mastoids remain clear. Visible internal auditory structures appear normal. Scalp and face soft tissues appear negative. IMPRESSION: No acute intracranial abnormality. Normal for age noncontrast MRI appearance of the brain. Electronically  Signed   By: Genevie Ann M.D.   On: 01/18/2020 17:11   US Venous Img Lower Bilateral (DVT)  Result Date: 01/17/2020 CLINICAL DATA:  Bilateral lower extremity pain EXAM: BILATERAL LOWER  EXTREMITY VENOUS DUPLEX ULTRASOUND TECHNIQUE: Gray-scale sonography with graded compression, as well as color Doppler and duplex ultrasound were performed to evaluate the lower extremity deep venous systems from the level of the common femoral vein and including the common femoral, femoral, profunda femoral, popliteal and calf veins including the posterior tibial, peroneal and gastrocnemius veins when visible. The superficial great saphenous vein was also interrogated. Spectral Doppler was utilized to evaluate flow at rest and with distal augmentation maneuvers in the common femoral, femoral and popliteal veins. COMPARISON:  None. FINDINGS: RIGHT LOWER EXTREMITY Common Femoral Vein: No evidence of thrombus. Normal compressibility, respiratory phasicity and response to augmentation. Saphenofemoral Junction: No evidence of thrombus. Normal compressibility and flow on color Doppler imaging. Profunda Femoral Vein: No evidence of thrombus. Normal compressibility and flow on color Doppler imaging. Femoral Vein: No evidence of thrombus. Normal compressibility, respiratory phasicity and response to augmentation. Popliteal Vein: No evidence of thrombus. Normal compressibility, respiratory phasicity and response to augmentation. Calf Veins: No evidence of thrombus. Normal compressibility and flow on color Doppler imaging. Superficial Great Saphenous Vein: No evidence of thrombus. Normal compressibility. Venous Reflux:  None. Other Findings:  None. LEFT LOWER EXTREMITY Common Femoral Vein: No evidence of thrombus. Normal compressibility, respiratory phasicity and response to augmentation. Saphenofemoral Junction: No evidence of thrombus. Normal compressibility and flow on color Doppler imaging. Profunda Femoral Vein: No evidence of thrombus. Normal compressibility and flow on color Doppler imaging. Femoral Vein: No evidence of thrombus. Normal compressibility, respiratory phasicity and response to augmentation. Popliteal Vein:  No evidence of thrombus. Normal compressibility, respiratory phasicity and response to augmentation. Calf Veins: No evidence of thrombus. Normal compressibility and flow on color Doppler imaging. Superficial Great Saphenous Vein: No evidence of thrombus. Normal compressibility. Venous Reflux:  None. Other Findings:  None. IMPRESSION: No evidence of deep venous thrombosis in either lower extremity. Electronically Signed   By: Lowella Grip III M.D.   On: 01/17/2020 13:26   DG Chest Port 1 View  Result Date: 01/17/2020 CLINICAL DATA:  Shortness of breath for 2 weeks EXAM: PORTABLE CHEST 1 VIEW COMPARISON:  10/23/2019 FINDINGS: Postsurgical changes in the cervical spine. Right greater than left ground-glass opacities and consolidations. Dense consolidation at the left base. No pleural effusion. Normal heart size. No pneumothorax. IMPRESSION: Bilateral ground-glass opacities and consolidations, suspect for bilateral pneumonia. There may be a component of underlying pulmonary interstitial edema. Electronically Signed   By: Donavan Foil M.D.   On: 01/17/2020 01:58         Discharge Exam: Vitals:   01/18/20 2030 01/19/20 0509  BP: (!) 157/84 (!) 164/91  Pulse: (!) 102 90  Resp: 17 15  Temp: 98.8 F (37.1 C) 98.8 F (37.1 C)  SpO2: 100% 100%   Vitals:   01/18/20 2024 01/18/20 2030 01/19/20 0500 01/19/20 0509  BP:  (!) 157/84  (!) 164/91  Pulse:  (!) 102  90  Resp:  17  15  Temp:  98.8 F (37.1 C)  98.8 F (37.1 C)  TempSrc:  Oral  Oral  SpO2: 100% 100%  100%  Weight:   48.3 kg   Height:        General: Pt is alert, awake, not in acute distress Cardiovascular: RRR, S1/S2 +, no rubs, no gallops Respiratory: fine bibasilar  rales. No wheeze Abdominal: Soft, NT, ND, bowel sounds + Extremities: no edema, no cyanosis   The results of significant diagnostics from this hospitalization (including imaging, microbiology, ancillary and laboratory) are listed below for reference.     Significant Diagnostic Studies: DG Chest 2 View  Result Date: 01/18/2020 CLINICAL DATA:  Shortness of breath.  Chest pain with inspiration. EXAM: CHEST - 2 VIEW COMPARISON:  01/17/2020 FINDINGS: Marked radiographic improvement. Only minimal residual interstitial edema. The lungs are largely clear. Heart size upper limits of normal. Aortic atherosclerosis. No visible effusion. IMPRESSION: Marked radiographic improvement with near complete resolution of pulmonary edema. Electronically Signed   By: Nelson Chimes M.D.   On: 01/18/2020 10:09   MR BRAIN WO CONTRAST  Result Date: 01/18/2020 CLINICAL DATA:  63 year old female with sensory disturbance. Hand numbness. EXAM: MRI HEAD WITHOUT CONTRAST TECHNIQUE: Multiplanar, multiecho pulse sequences of the brain and surrounding structures were obtained without intravenous contrast. COMPARISON:  Brain MRI, MRA head and neck 10/23/2019 and earlier. FINDINGS: Brain: Stable cerebral volume. No restricted diffusion to suggest acute infarction. No midline shift, mass effect, evidence of mass lesion, ventriculomegaly, extra-axial collection or acute intracranial hemorrhage. Cervicomedullary junction and pituitary are within normal limits. Pearline Cables and white matter signal is stable since February and largely normal throughout the brain. No cortical encephalomalacia or chronic cerebral blood products identified. Deep gray nuclei, brainstem and cerebellum are within normal limits. Vascular: Major intracranial vascular flow voids are stable since February. Skull and upper cervical spine: Normal for age visible cervical spine. Visualized bone marrow signal is within normal limits. Sinuses/Orbits: Stable and negative. Other: Mastoids remain clear. Visible internal auditory structures appear normal. Scalp and face soft tissues appear negative. IMPRESSION: No acute intracranial abnormality. Normal for age noncontrast MRI appearance of the brain. Electronically Signed   By: Genevie Ann M.D.    On: 01/18/2020 17:11   US Venous Img Lower Bilateral (DVT)  Result Date: 01/17/2020 CLINICAL DATA:  Bilateral lower extremity pain EXAM: BILATERAL LOWER EXTREMITY VENOUS DUPLEX ULTRASOUND TECHNIQUE: Gray-scale sonography with graded compression, as well as color Doppler and duplex ultrasound were performed to evaluate the lower extremity deep venous systems from the level of the common femoral vein and including the common femoral, femoral, profunda femoral, popliteal and calf veins including the posterior tibial, peroneal and gastrocnemius veins when visible. The superficial great saphenous vein was also interrogated. Spectral Doppler was utilized to evaluate flow at rest and with distal augmentation maneuvers in the common femoral, femoral and popliteal veins. COMPARISON:  None. FINDINGS: RIGHT LOWER EXTREMITY Common Femoral Vein: No evidence of thrombus. Normal compressibility, respiratory phasicity and response to augmentation. Saphenofemoral Junction: No evidence of thrombus. Normal compressibility and flow on color Doppler imaging. Profunda Femoral Vein: No evidence of thrombus. Normal compressibility and flow on color Doppler imaging. Femoral Vein: No evidence of thrombus. Normal compressibility, respiratory phasicity and response to augmentation. Popliteal Vein: No evidence of thrombus. Normal compressibility, respiratory phasicity and response to augmentation. Calf Veins: No evidence of thrombus. Normal compressibility and flow on color Doppler imaging. Superficial Great Saphenous Vein: No evidence of thrombus. Normal compressibility. Venous Reflux:  None. Other Findings:  None. LEFT LOWER EXTREMITY Common Femoral Vein: No evidence of thrombus. Normal compressibility, respiratory phasicity and response to augmentation. Saphenofemoral Junction: No evidence of thrombus. Normal compressibility and flow on color Doppler imaging. Profunda Femoral Vein: No evidence of thrombus. Normal compressibility and  flow on color Doppler imaging. Femoral Vein: No evidence of thrombus. Normal compressibility, respiratory phasicity  and response to augmentation. Popliteal Vein: No evidence of thrombus. Normal compressibility, respiratory phasicity and response to augmentation. Calf Veins: No evidence of thrombus. Normal compressibility and flow on color Doppler imaging. Superficial Great Saphenous Vein: No evidence of thrombus. Normal compressibility. Venous Reflux:  None. Other Findings:  None. IMPRESSION: No evidence of deep venous thrombosis in either lower extremity. Electronically Signed   By: Lowella Grip III M.D.   On: 01/17/2020 13:26   DG Chest Port 1 View  Result Date: 01/17/2020 CLINICAL DATA:  Shortness of breath for 2 weeks EXAM: PORTABLE CHEST 1 VIEW COMPARISON:  10/23/2019 FINDINGS: Postsurgical changes in the cervical spine. Right greater than left ground-glass opacities and consolidations. Dense consolidation at the left base. No pleural effusion. Normal heart size. No pneumothorax. IMPRESSION: Bilateral ground-glass opacities and consolidations, suspect for bilateral pneumonia. There may be a component of underlying pulmonary interstitial edema. Electronically Signed   By: Donavan Foil M.D.   On: 01/17/2020 01:58     Microbiology: Recent Results (from the past 240 hour(s))  SARS Coronavirus 2 by RT PCR (hospital order, performed in [facility] hospital lab) Nasopharyngeal Nasopharyngeal Swab     Status: None   Collection Time: 01/17/20  1:14 AM   Specimen: Nasopharyngeal Swab  Result Value Ref Range Status   SARS Coronavirus 2 NEGATIVE NEGATIVE Final    Comment: (NOTE) SARS-CoV-2 target nucleic acids are NOT DETECTED. The SARS-CoV-2 RNA is generally detectable in upper and lower respiratory specimens during the acute phase of infection. The lowest concentration of SARS-CoV-2 viral copies this assay can detect is 250 copies / mL. A negative result does not preclude SARS-CoV-2  infection and should not be used as the sole basis for treatment or other patient management decisions.  A negative result may occur with improper specimen collection / handling, submission of specimen other than nasopharyngeal swab, presence of viral mutation(s) within the areas targeted by this assay, and inadequate number of viral copies (<250 copies / mL). A negative result must be combined with clinical observations, patient history, and epidemiological information. Fact Sheet for Patients:   StrictlyIdeas.no Fact Sheet for Healthcare Providers: BankingDealers.co.za This test is not yet approved or cleared  by the [place] FDA and has been authorized for detection and/or diagnosis of SARS-CoV-2 by FDA under an Emergency Use Authorization (EUA).  This EUA will remain in effect (meaning this test can be used) for the duration of the COVID-19 declaration under Section 564(b)(1) of the Act, 21 U.S.C. section 360bbb-3(b)(1), unless the authorization is terminated or revoked sooner. Performed at [facility], [address] [place], [place] [address]      Labs: Basic Metabolic Panel: Recent Labs  Lab 01/17/20 0126 01/17/20 0126 01/18/20 0504 01/19/20 0604  NA 142  --  142 143  K 3.0*   < > 3.6 3.8  CL 110  --  104 107  CO2 24  --  25 28  GLUCOSE 156*  --  100* 105*  BUN 18  --  19 20  CREATININE 1.08*  --  0.94 0.88  CALCIUM 8.6*  --  9.2 9.5  MG  --   --  2.1 2.0   < > = values in this interval not displayed.   Liver Function Tests: Recent Labs  Lab 01/17/20 0126  AST 121*  ALT 87*  ALKPHOS 161*  BILITOT 0.4  PROT 6.2*  ALBUMIN 3.3*   No results for input(s): LIPASE, AMYLASE in the last 168 hours. No results  for input(s): AMMONIA in the last 168 hours. CBC: Recent Labs  Lab 01/17/20 0126 01/18/20 0504  WBC 6.4 4.6  NEUTROABS 4.3  --   HGB 9.4* 9.7*  HCT 31.3* 31.3*  MCV 92.1 89.4  PLT 422* 385    Cardiac Enzymes: No results for input(s): CKTOTAL, CKMB, CKMBINDEX, TROPONINI in the last 168 hours. BNP: Invalid input(s): POCBNP CBG: Recent Labs  Lab 01/18/20 1130 01/18/20 1726 01/18/20 2032 01/19/20 0736 01/19/20 1126  GLUCAP 89 76 125* 105* 101*    Time coordinating discharge:  36 minutes  Signed:  Orson Eva, DO [facility] Pager: [phone] 01/19/2020, 2:08 PM

## 2020-01-25 ENCOUNTER — Other Ambulatory Visit (HOSPITAL_COMMUNITY): Payer: Self-pay | Admitting: Internal Medicine

## 2020-01-25 DIAGNOSIS — Z6820 Body mass index (BMI) 20.0-20.9, adult: Secondary | ICD-10-CM | POA: Diagnosis not present

## 2020-01-25 DIAGNOSIS — R0602 Shortness of breath: Secondary | ICD-10-CM

## 2020-01-25 DIAGNOSIS — I5031 Acute diastolic (congestive) heart failure: Secondary | ICD-10-CM | POA: Diagnosis not present

## 2020-02-08 ENCOUNTER — Ambulatory Visit (HOSPITAL_COMMUNITY)
Admission: RE | Admit: 2020-02-08 | Discharge: 2020-02-08 | Disposition: A | Discharge status: Home / Self Care | Payer: Medicare Other | Source: Ambulatory Visit | Attending: Internal Medicine | Admitting: Internal Medicine

## 2020-02-08 ENCOUNTER — Other Ambulatory Visit (HOSPITAL_COMMUNITY): Payer: Self-pay | Admitting: Internal Medicine

## 2020-02-08 ENCOUNTER — Other Ambulatory Visit: Payer: Self-pay

## 2020-02-08 DIAGNOSIS — R0602 Shortness of breath: Secondary | ICD-10-CM

## 2020-02-08 DIAGNOSIS — I5031 Acute diastolic (congestive) heart failure: Secondary | ICD-10-CM | POA: Diagnosis not present

## 2020-02-08 DIAGNOSIS — Z79899 Other long term (current) drug therapy: Secondary | ICD-10-CM | POA: Diagnosis not present

## 2020-02-08 DIAGNOSIS — J984 Other disorders of lung: Secondary | ICD-10-CM | POA: Diagnosis not present

## 2020-02-11 ENCOUNTER — Ambulatory Visit: Payer: Medicare Other | Admitting: "Endocrinology

## 2020-02-12 ENCOUNTER — Encounter: Payer: Self-pay | Admitting: "Endocrinology

## 2020-02-12 ENCOUNTER — Ambulatory Visit (INDEPENDENT_AMBULATORY_CARE_PROVIDER_SITE_OTHER): Payer: Medicare Other | Admitting: "Endocrinology

## 2020-02-12 ENCOUNTER — Other Ambulatory Visit: Payer: Self-pay

## 2020-02-12 VITALS — BP 135/70 | HR 67 | Ht 60.0 in | Wt 113.4 lb

## 2020-02-12 DIAGNOSIS — E270 Other adrenocortical overactivity: Secondary | ICD-10-CM

## 2020-02-12 DIAGNOSIS — Z6821 Body mass index (BMI) 21.0-21.9, adult: Secondary | ICD-10-CM | POA: Diagnosis not present

## 2020-02-12 DIAGNOSIS — R7989 Other specified abnormal findings of blood chemistry: Secondary | ICD-10-CM

## 2020-02-12 DIAGNOSIS — R636 Underweight: Secondary | ICD-10-CM | POA: Diagnosis not present

## 2020-02-12 DIAGNOSIS — I5032 Chronic diastolic (congestive) heart failure: Secondary | ICD-10-CM | POA: Diagnosis not present

## 2020-02-12 DIAGNOSIS — N1831 Chronic kidney disease, stage 3a: Secondary | ICD-10-CM | POA: Diagnosis not present

## 2020-02-12 NOTE — Progress Notes (Signed)
02/12/2020, 5:28 PM  Endocrinology follow-up note   Subjective:    Patient ID: Linda English, female    DOB: March 28, 1956, PCP [name], MD   Past Medical History:  Diagnosis Date  . Acid reflux   . Allergic rhinitis   . Anxiety   . Arthritis    osteoarthritis  . Bipolar disorder ([place])   . Chronic headache   . Depression   . Diverticula of colon   . DM (diabetes mellitus) ([place])   . Gastric erosions   . Gastric polyps    benign   . Hemorrhoids   . Hiatal hernia   . Hypertension   . IBS (irritable bowel syndrome)   . Lichen planus   . Obstructive sleep apnea   . Panic disorder   . Parkinson's disease ([place])   . Rectal cancer ([place]) 2003   ileostomy and reversal  . Subdural hematoma ([place])   . Tubular adenoma    Past Surgical History:  Procedure Laterality Date  . ABDOMINAL HYSTERECTOMY    . APPENDECTOMY    . BACK SURGERY    . BIOPSY  10/01/2019   Procedure: BIOPSY;  Surgeon: Daneil Dolin, MD;  [facility];  Service: Endoscopy;;  gastric  . CESAREAN SECTION     X  2  . CHOLECYSTECTOMY    . COLONOSCOPY  08/2007   DR Gala Romney, friable anal canal hemorrhoids, surgical anastomosis at 3cm, distal scattered tics  . COLONOSCOPY  12/15/2010   anal papilla and internal hemorrhoids/diminutive polyp in the base of the cecum. Past, tubular adenoma.. Next colonoscopy due in April 2017.  Marland Kitchen COLONOSCOPY  10/27/2005   RMR: Anal canal hemorrhoids. Surgical anastomosis at 3 cm from the anal verge appeared normal. Few scattered distal diverticula. The residual  colonic mucosa appeared normal. I suspect the patient bled from hemorrhoids  . COLONOSCOPY N/A 01/15/2015   ZYS:AYTKZS residual rectum and colon. next tcs 12/2019  . ESOPHAGEAL DILATION N/A 06/27/2015   Procedure: ESOPHAGEAL DILATION;  Surgeon: Daneil Dolin, MD;  [facility];  Service: Endoscopy;  Laterality: N/A;  . ESOPHAGOGASTRODUODENOSCOPY   05/2002   DR ROURK, normal  . ESOPHAGOGASTRODUODENOSCOPY  08/03/2011   RMR: small HH/ gastric polyps  . ESOPHAGOGASTRODUODENOSCOPY N/A 06/27/2015   Dr. Gala Romney: Mild erosive reflux esophagitits. Status post passage of a Maloney dilator. Hiatal hernia. Gastric polyps and abnormal gastric mucosa of doubtful clinical significane. status post biopsy, benign fundic gland polyp, negative H.pylori  . ESOPHAGOGASTRODUODENOSCOPY (EGD) WITH PROPOFOL N/A 10/01/2019   rourk: Normal esophagus status post esophageal dilation for dysphagia, erythematous mucosa in the stomach biopsy consistent with reactive gastropathy, no H. pylori, multiple gastric polyps (biopsy fundic gland)  . low anterior rection  2003   RECTAL CANCER  . MALONEY DILATION N/A 10/01/2019   Procedure: Venia Minks DILATION;  Surgeon: Daneil Dolin, MD;  [facility];  Service: Endoscopy;  Laterality: N/A;  . SPINE SURGERY  2001   L5,S1 HEMILAMINOTOMY AND DISCECTOMY/DR DEATON   Social History   Socioeconomic History  . Marital status: Divorced    Spouse name: Not on file  . Number of children: Not on file  . Years of education: Not on file  . Highest  education level: Not on file  Occupational History  . Occupation: Product manager: RETIRED    Comment: [employer]  Tobacco Use  . Smoking status: Never Smoker  . Smokeless tobacco: Never Used  . Tobacco comment: Never smoked  Vaping Use  . Vaping Use: Never used  Substance and Sexual Activity  . Alcohol use: No  . Drug use: No  . Sexual activity: Never  Other Topics Concern  . Not on file  Social History Narrative  . Not on file   Social Determinants of Health   Financial Resource Strain:   . Difficulty of Paying Living Expenses:   Food Insecurity:   . Worried About Charity fundraiser in the Last Year:   . Arboriculturist in the Last Year:   Transportation Needs:   . Film/video editor (Medical):   Marland Kitchen Lack of Transportation (Non-Medical):   Physical  Activity:   . Days of Exercise per Week:   . Minutes of Exercise per Session:   Stress:   . Feeling of Stress :   Social Connections: Unknown  . Frequency of Communication with Friends and Family: Not on file  . Frequency of Social Gatherings with Friends and Family: Once a week  . Attends Religious Services: More than 4 times per year  . Active Member of Clubs or Organizations: No  . Attends Archivist Meetings: Never  . Marital Status: Divorced   Family History  Problem Relation Age of Onset  . Colon cancer Paternal Grandfather 8  . Colon polyps Brother   . Colon polyps Cousin   . Diabetes Mother   . Hypertension Mother   . Hypertension Father   . Heart attack Father   . Heart failure Father    Outpatient Encounter Medications as of 02/12/2020  Medication Sig  . ALPRAZolam (XANAX) 0.5 MG tablet Take 1 tablet (0.5 mg total) by mouth 3 (three) times daily as needed for anxiety or sleep.  Marland Kitchen aspirin 81 MG chewable tablet Chew 1 tablet (81 mg total) by mouth daily.  Marland Kitchen diltiazem (CARDIZEM CD) 120 MG 24 hr capsule Take 1 capsule (120 mg total) by mouth daily.  Marland Kitchen dronabinol (MARINOL) 2.5 MG capsule Take 1 capsule (2.5 mg total) by mouth 2 (two) times daily before lunch and supper.  . ferrous sulfate 325 (65 FE) MG tablet Take 1 tablet (325 mg total) by mouth daily with breakfast.  . folic acid (FOLVITE) 1 MG tablet Take 1 tablet (1 mg total) by mouth daily.  . furosemide (LASIX) 40 MG tablet Take 1 tablet (40 mg total) by mouth daily.  Marland Kitchen lamoTRIgine (LAMICTAL) 200 MG tablet Take 200 mg by mouth 2 (two) times daily.   Marland Kitchen linaclotide (LINZESS) 72 MCG capsule Take 72 mcg by mouth daily as needed (for constipation).   . metoprolol succinate (TOPROL-XL) 50 MG 24 hr tablet Take 1 tablet (50 mg total) by mouth daily. Take with or immediately following a meal.  . ondansetron (ZOFRAN) 4 MG tablet Take 1 tablet (4 mg total) by mouth 3 (three) times daily before meals. (Patient taking  differently: Take 4 mg by mouth daily as needed for nausea or vomiting. )  . pantoprazole (PROTONIX) 40 MG tablet Take 40 mg by mouth 2 (two) times daily before a meal.  . potassium chloride SA (KLOR-CON) 20 MEQ tablet Take 1 tablet (20 mEq total) by mouth daily.  Marland Kitchen thiamine 100 MG tablet Take 1 tablet (100 mg  total) by mouth daily.  . vitamin B-12 (CYANOCOBALAMIN) 500 MCG tablet Take 1 tablet (500 mcg total) by mouth daily.  . [DISCONTINUED] insulin aspart (NOVOLOG) 100 UNIT/ML injection insulin aspart (novoLOG) injection 0-9 Units 0-9 Units, Subcutaneous, 3 times daily with meals, CBG < 70: Implement Hypoglycemia Standing Orders and refer to Hypoglycemia Standing Orders sidebar report  CBG 70 - 120: 0 units CBG 121 - 150: 1 unit CBG 151 - 200: 2 units CBG 201 - 250: 3 units CBG 251 - 300: 5 units CBG 301 - 350: 7 units CBG 351 - 400: 9 units CBG > 400: (Patient taking differently: Inject 0-9 Units into the skin See admin instructions. insulin aspart (novoLOG) injection 0-9 Units 0-9 Units, Subcutaneous, 3 times daily with meals, CBG < 70: Implement Hypoglycemia Standing Orders and refer to Hypoglycemia Standing Orders sidebar report  CBG 70 - 120: 0 units CBG 121 - 150: 1 unit CBG 151 - 200: 2 units CBG 201 - 250: 3 units CBG 251 - 300: 5 units CBG 301 - 350: 7 units CBG 351 - 400: 9 units CBG > 400: TO take as needed)   No facility-administered encounter medications on file as of 02/12/2020.   ALLERGIES: Allergies  Allergen Reactions  . Shellfish Allergy Anaphylaxis  . Lithium Nausea And Vomiting  . Excedrin Extra Strength [Aspirin-Acetaminophen-Caffeine] Other (See Comments)    Makes her nervous.  Bobbye [place] [Clonazepam] Other (See Comments)    hallucinations    VACCINATION STATUS: Immunization History  Administered Date(s) Administered  . Influenza,inj,Quad PF,6+ Mos 06/25/2019  . Pneumococcal Polysaccharide-23 08/13/2019    HPI Linda English is 64 y.o. female who presents  today with a medical history as above. -She was seen in the clinic in April 2021 in consult for hypercortisolism.  Her subsequent 24-hour urine free cortisol was normal.  She has multiple medical problems as above.  She did have recent hospitalization for metabolic encephalopathy.  Review did not reveal exposure to steroids.  Her work-up on February 26 showed a more so elevated at 40.3.  She did have 1 prior measurement of a.m. cortisol which was 9.5 - normal 2013.  Her 24-hour urine free cortisol was normal at 12.7. She denies any history of unexplained weight gain.  She denies family history of adrenal, thyroid dysfunction. She has history of diabetes, although well-controlled with A1c of 5.8%.  She was taken off of most of her diabetes medications recently, including insulin treatment. CT abdomen done indue to nausea/vomiting -adrenals were unremarkable. -She had mostly been underweight, in October 2020 she weighed 118 pounds.  In the interim her weight fluctuated so much that at one point she weighed less than 100 pounds.  Today in the clinic she weighed 113 pounds, gaining 9 pounds since last visit.  He denies nausea/vomiting. -She was never diagnosed with adrenal dysfunction.  She has chronic problem with equilibrium which made her wheelchair-bound.   Review of Systems  Constitutional: + Significantly fluctuating body weight, reported being underweight most of her adult life,   + fatigue, no subjective hyperthermia, no subjective hypothermia Eyes: no blurry vision, no xerophthalmia ENT: no sore throat, no nodules palpated in throat, no dysphagia/odynophagia, no hoarseness Cardiovascular: no Chest Pain, no Shortness of Breath, no palpitations, no leg swelling Respiratory: no cough, no shortness of breath Gastrointestinal: no Nausea/Vomiting/Diarhhea Musculoskeletal: no muscle/joint aches, wheelchair-bound Skin: no rashes Neurological: + tremors-she has Parkinson's disease, no numbness, no  tingling, no dizziness Psychiatric: +depression,  + anxiety  Objective:  Vitals with BMI 02/12/2020 01/19/2020 01/18/2020  Height 5\' 0"  - -  Weight 113 lbs 6 oz 106 lbs 8 oz -  BMI 82.50 53.97 -  Systolic 673 419 379  Diastolic 70 91 84  Pulse 67 90 102  Some encounter information is confidential and restricted. Go to Review Flowsheets activity to see all data.    BP 135/70   Pulse 67   Ht 5' (1.524 m)   Wt 113 lb 6.4 oz (51.4 kg)   BMI 22.15 kg/m   Wt Readings from Last 3 Encounters:  02/12/20 113 lb 6.4 oz (51.4 kg)  01/19/20 106 lb 7.7 oz (48.3 kg)  12/11/19 104 lb 12.8 oz (47.5 kg)    Physical Exam  Constitutional:  Body mass index is 22.15 kg/m.,  + Chronically sick looking, not in acute distress.  + Walking without a chair or cane this time. .   Eyes: PERRLA, EOMI, no exophthalmos ENT: moist mucous membranes, no gross thyromegaly, no gross cervical lymphadenopathy Cardiovascular: normal precordial activity, Regular Rate and Rhythm, no Murmur/Rubs/Gallops Respiratory:  adequate breathing efforts, no gross chest deformity, Clear to auscultation bilaterally Gastrointestinal: abdomen soft, Non -tender, No distension, Bowel Sounds present. Musculoskeletal:  no gross deformities. Skin: moist, warm, no rashes Neurological: - tremor with outstretched hands, Deep tendon reflexes normal in bilateral lower extremities.  CMP ( most recent) CMP     Component Value Date/Time   NA 143 01/19/2020 0604   NA 143 06/26/2010 1400   K 3.8 01/19/2020 0604   K 4.6 06/26/2010 1400   CL 107 01/19/2020 0604   CO2 28 01/19/2020 0604   GLUCOSE 105 (H) 01/19/2020 0604   BUN 20 01/19/2020 0604   BUN 13 06/26/2010 1400   CREATININE 0.88 01/19/2020 0604   CREATININE 0.64 10/03/2013 0838   CALCIUM 9.5 01/19/2020 0604   PROT 6.2 (L) 01/17/2020 0126   ALBUMIN 3.3 (L) 01/17/2020 0126   ALBUMIN 4.1 06/26/2010 1400   AST 121 (H) 01/17/2020 0126   AST 17 06/26/2010 1400   ALT 87 (H)  01/17/2020 0126   ALKPHOS 161 (H) 01/17/2020 0126   ALKPHOS 92 06/26/2010 1400   BILITOT 0.4 01/17/2020 0126   BILITOT 0.3 06/26/2010 1400   GFRNONAA >60 01/19/2020 0604   GFRAA >60 01/19/2020 0604    Diabetic Labs (most recent): Lab Results  Component Value Date   HGBA1C 5.0 01/17/2020   HGBA1C 5.8 (H) 10/23/2019   HGBA1C 5.7 (H) 08/12/2019     Lipid Panel ( most recent) Lipid Panel     Component Value Date/Time   CHOL 159 08/10/2015 0621   TRIG 188 (H) 08/10/2015 0621   HDL 53 08/10/2015 0621   CHOLHDL 3.0 08/10/2015 0621   VLDL 38 08/10/2015 0621   LDLCALC 68 08/10/2015 0621      Lab Results  Component Value Date   TSH 2.230 01/18/2020   TSH 2.048 10/26/2019   TSH 2.213 10/03/2013   TSH 1.312 04/27/2012   FREET4 1.03 01/18/2020   FREET4 1.41 (H) 10/26/2019   FREET4 1.20 04/27/2012    CBC    Component Value Date/Time   WBC 4.6 01/18/2020 0504   RBC 3.50 (L) 01/18/2020 0504   HGB 9.7 (L) 01/18/2020 0504   HCT 31.3 (L) 01/18/2020 0504   PLT 385 01/18/2020 0504   MCV 89.4 01/18/2020 0504   MCH 27.7 01/18/2020 0504   MCHC 31.0 01/18/2020 0504   RDW 15.0 01/18/2020 0504   LYMPHSABS 1.5 01/17/2020 0126  MONOABS 0.4 01/17/2020 0126   EOSABS 0.2 01/17/2020 0126   BASOSABS 0.0 01/17/2020 0126   Results for KARALEE, HAUTER (MRN [id number]) as of 02/12/2020 17:32  Ref. Range 01/18/2020 17:26 01/19/2020 06:04  Glucose Latest Ref Range: 70 - 99 mg/dL  105 (H)  TSH Latest Ref Range: 0.350 - 4.500 uIU/mL 2.230   T4,Free(Direct) Latest Ref Range: 0.61 - 1.12 ng/dL 1.03    24-hour urine free cortisol was normal at 12.7. Assessment & Plan:   1. Hypercortisolemia-resolved -Her work-up so far did not confirm endogenous hypercortisolism, will not need any further work-up.   CT scan of the abdomen done in October 2020 showed normal adrenals.  2.  Underweight/undernourished -This seems to be her major health problem. -Patient with multiple medical problems who  appears to have multifactorial nutritional deficiency. She is encouraged to introduce more carbs to her diet including rice, pasta, potatoes, juice, and various meats. -She would not need treatment for diabetes at this time. -Her recent thyroid function tests were within normal limits. - I did not initiate any new prescriptions today.  - she is advised to maintain close follow up with [name], MD for primary care needs.      - Time spent on this patient care encounter:  20 minutes of which 50% was spent in  counseling and the rest reviewing  her current and  previous labs / studies and medications  doses and developing a plan for long term care. Linda English  participated in the discussions, expressed understanding, and voiced agreement with the above plans.  All questions were answered to her satisfaction. she is encouraged to contact clinic should she have any questions or concerns prior to her return visit.   Follow up plan: Return in about 6 months (around 08/13/2020) for F/U with Pre-visit Labs.   Glade Lloyd, MD [facility] [address] [place], [place] [address] Phone: [phone]  Fax: [phone]     02/12/2020, 5:28 PM  This note was partially dictated with voice recognition software. Similar sounding words can be transcribed inadequately or may not  be corrected upon review.

## 2020-02-14 NOTE — Progress Notes (Signed)
Referring Provider: [name], MD Primary Care Physician:  [name], MD Primary GI Physician: Dr. Gala Romney  Chief Complaint  Patient presents with   Colonoscopy    due for tcs   Dysphagia    pills gag her    HPI:   Linda English is a 64 y.o. female presenting to schedule her 5-year surveillance colonoscopy and reports dysphagia. History of  alternating constipation and diarrhea, rectal cancer s/p resection in 2003. Colonoscopy May 2016 with normal residual rectum and colon. Surgical anastomosis at approximately 5 cm from anal verge. 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 She underwent at least 7 sessions of biofeedback retraining.Historically difficult to manage bowel regimen as medications will work for some time and then result in diarrhea.   More recently struggling with intractable vomiting with nausea and weight loss.  During hospitalization in February, EGD 10/01/2019: normal esophagus status post esophageal dilation for dysphagia, erythematous mucosa in the stomach consistent with reactive gastropathy on biopsy, no H. pylori, multiple fundic gland polyps based on biopsy. At her last visit in March 2021, she continued with vomiting 3 times a day.  Was not taking erythromycin as it was not helpful.  Zofran without improvement. She was to continue Protonix BID, started on Marinol with plans for GES.  She was due for surveillance colonoscopy but patient would not be able to tolerate prep due to ongoing nausea and vomiting.  Regarding weight loss, she had multiple imaging studies including CT head, MRI brain, CT A/P without acute findings.  Fasting cortisol level elevated at 40 while inpatient.  She was referred to endocrinology.  GES 11/15/2019 within normal limits.  She was referred to [facility] health for further evaluation.  Appointment with [facility]  12/10/2019.  Patient reported her nausea and vomiting had been completely relieved by Marinol.  She had an excellent appetite and had gained 10 pounds back in the last 2-1/2 weeks.  She is drinking 1 Ensure on most days and typically eats 1 meal each day.  Bowels continue to be irregular with 1 Bristol 5-7 BM about once per week.  Taking Linzess on an as-needed basis.  Advised to continue Marinol, 1-2 Ensure daily, follow-up with Dr. Buford Dresser for surveillance colonoscopy, take Linzess daily, may need 3 referral to PT for retraining and biofeedback therapy, continue PPI twice daily, follow-up as needed.  Patient was admitted to the hospital 01/17/2020-01/19/2020 after presenting with report of 3-day history of shortness of breath and 1 week history of worsening lower extremity edema.  She was admitted with acute diastolic CHF, pulmonary edema.  She was diuresed and discharged on furosemide 40 mg daily.  Today: Nausea and vomiting is much improved/resolved. Gaining some weight back.   She is having trouble with swallowing her pills. Potassium is causing the most trouble. She is now taking liquid potassium. States this taste bad. Minimal trouble with other pills. Will take 1-2 pills at a time. States she can "feel it more." Doesn't get stuck. Wants to monitor this for now. No trouble with foods or liquids. Overall improved after last dilation.   Occasional burning sensation. Heartburn symptoms a few times a week. Trying to avoid fried/fatty foods. No soda. Avoiding spicy foods. Had pineapples this morning and has mild burning at this time. Prefers to continue with Protonix.   Occasional upper abdominal pain. This had been associated with constipation. Now she is having  a BM daily. Not taking Linzess at this time. Hasn't taken it in "a long time". Hasn't needed it. Has had some blood on toilet tissue in the setting of hemorrhoids that were prolapsed. This was several weeks ago. Used preparation H which resolved  symptoms.  Stools are typically Bristol 4, occasional 3 or 5.   Intermittent chest discomfort. Not really associated with exertion. Not sure if this is gas or reflux related. No palpitations. Occasional SOB at rest or with exertion, no regular cough. Feels she is back to her baseline since last hospitalization. Swelling in legs is much improved.   Past Medical History:  Diagnosis Date   Acid reflux    Allergic rhinitis    Anxiety    Arthritis    osteoarthritis   Bipolar disorder (HCC)    Chronic headache    Depression    Diverticula of colon    DM (diabetes mellitus) (HCC)    Gastric erosions    Gastric polyps    benign    Hemorrhoids    Hiatal hernia    Hypertension    IBS (irritable bowel syndrome)    Lichen planus    Obstructive sleep apnea    Panic disorder    Parkinson's disease ([place])    Rectal cancer ([place]) 2003   ileostomy and reversal   Subdural hematoma ([place])    Tubular adenoma     Past Surgical History:  Procedure Laterality Date   ABDOMINAL HYSTERECTOMY     APPENDECTOMY     BACK SURGERY     BIOPSY  10/01/2019   Procedure: BIOPSY;  Surgeon: Daneil Dolin, MD;  [facility]: [facility];  Service: Endoscopy;;  gastric   CESAREAN SECTION     X  2   CHOLECYSTECTOMY     COLONOSCOPY  08/2007   DR Gala Romney, friable anal canal hemorrhoids, surgical anastomosis at 3cm, distal scattered tics   COLONOSCOPY  12/15/2010   anal papilla and internal hemorrhoids/diminutive polyp in the base of the cecum. Past, tubular adenoma.. Next colonoscopy due in April 2017.   COLONOSCOPY  10/27/2005   RMR: Anal canal hemorrhoids. Surgical anastomosis at 3 cm from the anal verge appeared normal. Few scattered distal diverticula. The residual  colonic mucosa appeared normal. I suspect the patient bled from hemorrhoids   COLONOSCOPY N/A 01/15/2015   IOM:BTDHRC residual rectum and colon. next tcs 12/2019   ESOPHAGEAL DILATION N/A 06/27/2015   Procedure:  ESOPHAGEAL DILATION;  Surgeon: Daneil Dolin, MD;  Location: [facility];  Service: Endoscopy;  Laterality: N/A;   ESOPHAGOGASTRODUODENOSCOPY  05/2002   DR Gala Romney, normal   ESOPHAGOGASTRODUODENOSCOPY  08/03/2011   RMR: small HH/ gastric polyps   ESOPHAGOGASTRODUODENOSCOPY N/A 06/27/2015   Dr. Gala Romney: Mild erosive reflux esophagitits. Status post passage of a Maloney dilator. Hiatal hernia. Gastric polyps and abnormal gastric mucosa of doubtful clinical significane. status post biopsy, benign fundic gland polyp, negative H.pylori   ESOPHAGOGASTRODUODENOSCOPY (EGD) WITH PROPOFOL N/A 10/01/2019   rourk: Normal esophagus status post esophageal dilation for dysphagia, erythematous mucosa in the stomach biopsy consistent with reactive gastropathy, no H. pylori, multiple gastric polyps (biopsy fundic gland)   low anterior rection  2003   RECTAL CANCER   MALONEY DILATION N/A 10/01/2019   Procedure: MALONEY DILATION;  Surgeon: Daneil Dolin, MD;  Location: [facility];  Service: Endoscopy;  Laterality: N/A;   SPINE SURGERY  2001   L5,S1 HEMILAMINOTOMY AND DISCECTOMY/DR DEATON    Current Outpatient Medications  Medication Sig  Dispense Refill   ALPRAZolam (XANAX) 0.5 MG tablet Take 1 tablet (0.5 mg total) by mouth 3 (three) times daily as needed for anxiety or sleep. 12 tablet 0   aspirin 81 MG chewable tablet Chew 1 tablet (81 mg total) by mouth daily.     diltiazem (CARDIZEM CD) 120 MG 24 hr capsule Take 1 capsule (120 mg total) by mouth daily. 30 capsule 1   dronabinol (MARINOL) 2.5 MG capsule Take 1 capsule (2.5 mg total) by mouth 2 (two) times daily before lunch and supper. 60 capsule 3   ferrous sulfate 325 (65 FE) MG tablet Take 1 tablet (325 mg total) by mouth daily with breakfast.  3   folic acid (FOLVITE) 1 MG tablet Take 1 tablet (1 mg total) by mouth daily. 30 tablet 5   furosemide (LASIX) 40 MG tablet Take 1 tablet (40 mg total) by mouth daily. 30 tablet 1   lamoTRIgine  (LAMICTAL) 200 MG tablet Take 200 mg by mouth 2 (two) times daily.      linaclotide (LINZESS) 72 MCG capsule Take 72 mcg by mouth daily as needed (for constipation).      metoprolol succinate (TOPROL-XL) 50 MG 24 hr tablet Take 1 tablet (50 mg total) by mouth daily. Take with or immediately following a meal. 30 tablet 1   pantoprazole (PROTONIX) 40 MG tablet Take 40 mg by mouth 2 (two) times daily before a meal.     potassium chloride 20 MEQ/15ML (10%) SOLN Take 5 mLs by mouth daily.     thiamine 100 MG tablet Take 1 tablet (100 mg total) by mouth daily. 30 tablet 5   vitamin B-12 (CYANOCOBALAMIN) 500 MCG tablet Take 1 tablet (500 mcg total) by mouth daily. 30 tablet 5   No current facility-administered medications for this visit.    Allergies as of 02/15/2020 - Review Complete 02/15/2020  Allergen Reaction Noted   Shellfish allergy Anaphylaxis 06/24/2019   Lithium Nausea And Vomiting 06/27/2015   Excedrin extra strength [aspirin-acetaminophen-caffeine] Other (See Comments) 10/28/2014   Klonopin [clonazepam] Other (See Comments) 10/23/2019    Family History  Problem Relation Age of Onset   Colon cancer Paternal Grandfather 28   Colon polyps Brother    Colon polyps Cousin    Diabetes Mother    Hypertension Mother    Hypertension Father    Heart attack Father    Heart failure Father     Social History   Socioeconomic History   Marital status: Divorced    Spouse name: Not on file   Number of children: Not on file   Years of education: Not on file   Highest education level: Not on file  Occupational History   Occupation: Product manager: RETIRED    Comment: [employer]  Tobacco Use   Smoking status: Never Smoker   Smokeless tobacco: Never Used   Tobacco comment: Never smoked  Vaping Use   Vaping Use: Never used  Substance and Sexual Activity   Alcohol use: No   Drug use: No   Sexual activity: Never  Other Topics Concern   Not on  file  Social History Narrative   Not on file   Social Determinants of Health   Financial Resource Strain:    Difficulty of Paying Living Expenses:   Food Insecurity:    Worried About [place] in the Last Year:    [place] in the Last Year:   Transportation Needs:  Lack of Transportation (Medical):    Lack of Transportation (Non-Medical):   Physical Activity:    Days of Exercise per Week:    Minutes of Exercise per Session:   Stress:    Feeling of Stress :   Social Connections: Unknown   Frequency of Communication with Friends and Family: Not on file   Frequency of Social Gatherings with Friends and Family: Once a week   Attends Religious Services: More than 4 times per year   Active Member of [employer] or Organizations: No   Attends Music therapist: Never   Marital Status: Divorced    Review of Systems: Gen: Denies fever, chills, lightheadedness, dizziness, presyncope, syncope.  CV: See HPI Resp: See HPI GI: See HPI Derm: Denies rash Heme: See HPI  Physical Exam: BP 136/74    Pulse 70    Temp (!) 96.9 F (36.1 C) (Temporal)    Ht 5' (1.524 m)    Wt 111 lb 12.8 oz (50.7 kg)    BMI 21.83 kg/m  General:   Alert and oriented. No distress noted. Pleasant and cooperative.  Head:  Normocephalic and atraumatic. Eyes:  Conjuctiva clear without scleral icterus. Heart:  S1, S2 present without murmurs appreciated. Lungs:  Clear to auscultation bilaterally. No wheezes, rales, or rhonchi. No distress.  Abdomen:  +BS, soft, non-tender and non-distended. No rebound or guarding. No HSM or masses noted. Msk:  Symmetrical without gross deformities. Normal posture. Extremities:  Without edema. Neurologic:  Alert and  oriented x4 Psych:  Normal mood and affect.

## 2020-02-15 ENCOUNTER — Ambulatory Visit (INDEPENDENT_AMBULATORY_CARE_PROVIDER_SITE_OTHER): Payer: Medicare Other | Admitting: Gastroenterology

## 2020-02-15 ENCOUNTER — Other Ambulatory Visit: Payer: Self-pay

## 2020-02-15 ENCOUNTER — Encounter: Payer: Self-pay | Admitting: Gastroenterology

## 2020-02-15 VITALS — BP 136/74 | HR 70 | Temp 96.9°F | Ht 60.0 in | Wt 111.8 lb

## 2020-02-15 DIAGNOSIS — R198 Other specified symptoms and signs involving the digestive system and abdomen: Secondary | ICD-10-CM | POA: Diagnosis not present

## 2020-02-15 DIAGNOSIS — R1310 Dysphagia, unspecified: Secondary | ICD-10-CM | POA: Diagnosis not present

## 2020-02-15 DIAGNOSIS — R112 Nausea with vomiting, unspecified: Secondary | ICD-10-CM | POA: Diagnosis not present

## 2020-02-15 DIAGNOSIS — Z85048 Personal history of other malignant neoplasm of rectum, rectosigmoid junction, and anus: Secondary | ICD-10-CM | POA: Insufficient documentation

## 2020-02-15 DIAGNOSIS — K219 Gastro-esophageal reflux disease without esophagitis: Secondary | ICD-10-CM

## 2020-02-15 NOTE — Patient Instructions (Addendum)
We will get you scheduled for a colonoscopy in the near future with Dr. Gala Romney.  Please hold iron for 7 days prior to your procedure.   I am glad your nausea/vomiting is doing better.  Continue taking Marinol.   Continue taking Protonix 40 mg twice daily 30 minutes before breakfast and dinner.  Be sure you are following a strict GERD diet: Avoid fried, fatty, greasy, spicy, citrus foods. Avoid caffeine and carbonated beverages. Avoid chocolate. Try eating 4-6 small meals a day rather than 3 large meals. Do not eat within 3 hours of laying down. Prop head of bed up on wood or bricks to create a 6 inch incline.  Continue to use Linzess 72 mcg as needed for constipation.  We will plan to see back in the office after your colonoscopy.  Call with questions or concerns prior.  Aliene Altes, PA-C [facility]   Food Choices for Gastroesophageal Reflux Disease, Adult When you have gastroesophageal reflux disease (GERD), the foods you eat and your eating habits are very important. Choosing the right foods can help ease the discomfort of GERD. Consider working with a diet and nutrition specialist (dietitian) to help you make healthy food choices. What general guidelines should I follow?  Eating plan  Choose healthy foods low in fat, such as fruits, vegetables, whole grains, low-fat dairy products, and lean meat, fish, and poultry.  Eat frequent, small meals instead of three large meals each day. Eat your meals slowly, in a relaxed setting. Avoid bending over or lying down until 2-3 hours after eating.  Limit high-fat foods such as fatty meats or fried foods.  Limit your intake of oils, butter, and shortening to less than 8 teaspoons each day.  Avoid the following: ? Foods that cause symptoms. These may be different for different people. Keep a food diary to keep track of foods that cause symptoms. ? Alcohol. ? Drinking large amounts of liquid with meals. ? Eating meals  during the 2-3 hours before bed.  Cook foods using methods other than frying. This may include baking, grilling, or broiling. Lifestyle  Maintain a healthy weight. Ask your health care provider what weight is healthy for you. If you need to lose weight, work with your health care provider to do so safely.  Exercise for at least 30 minutes on 5 or more days each week, or as told by your health care provider.  Avoid wearing clothes that fit tightly around your waist and chest.  Do not use any products that contain nicotine or tobacco, such as cigarettes and e-cigarettes. If you need help quitting, ask your health care provider.  Sleep with the head of your bed raised. Use a wedge under the mattress or blocks under the bed frame to raise the head of the bed. What foods are not recommended? The items listed may not be a complete list. Talk with your dietitian about what dietary choices are best for you. Grains Pastries or quick breads with added fat. [place] toast. Vegetables Deep fried vegetables. [place] fries. Any vegetables prepared with added fat. Any vegetables that cause symptoms. For some people this may include tomatoes and tomato products, chili peppers, onions and garlic, and horseradish. Fruits Any fruits prepared with added fat. Any fruits that cause symptoms. For some people this may include citrus fruits, such as oranges, grapefruit, pineapple, and lemons. Meats and other protein foods High-fat meats, such as fatty beef or pork, hot dogs, ribs, ham, sausage, salami and bacon. Fried meat or  protein, including fried fish and fried chicken. Nuts and nut butters. Dairy Whole milk and chocolate milk. Sour cream. Cream. Ice cream. Cream cheese. Milk shakes. Beverages Coffee and tea, with or without caffeine. Carbonated beverages. Sodas. Energy drinks. Fruit juice made with acidic fruits (such as orange or grapefruit). Tomato juice. Alcoholic drinks. Fats and oils Butter. Margarine.  Shortening. Ghee. Sweets and desserts Chocolate and cocoa. Donuts. Seasoning and other foods Pepper. Peppermint and spearmint. Any condiments, herbs, or seasonings that cause symptoms. For some people, this may include curry, hot sauce, or vinegar-based salad dressings. Summary  When you have gastroesophageal reflux disease (GERD), food and lifestyle choices are very important to help ease the discomfort of GERD.  Eat frequent, small meals instead of three large meals each day. Eat your meals slowly, in a relaxed setting. Avoid bending over or lying down until 2-3 hours after eating.  Limit high-fat foods such as fatty meat or fried foods. This information is not intended to replace advice given to you by your health care provider. Make sure you discuss any questions you have with your health care provider. Document Revised: 12/07/2018 Document Reviewed: 08/17/2016 Elsevier Patient Education  [place].

## 2020-02-17 ENCOUNTER — Encounter: Payer: Self-pay | Admitting: Gastroenterology

## 2020-02-17 NOTE — Assessment & Plan Note (Signed)
GERD symptoms fairly well controlled on Protonix 40 mg twice daily.  She does admit to occasional breakthrough symptoms a few times a week trying to avoid fried/fatty/spicy foods as well as soda.  Discussed potentially trying a different PPI but patient prefers to continue with Protonix as she feels this is doing fairly well.  Continue Protonix 40 mg twice daily 30 minutes before breakfast and dinner. Follow a GERD diet:   Avoid fried, fatty, greasy, spicy, citrus foods.  Avoid caffeine and carbonated beverages.  Avoid chocolate.  Try eating 4-6 small meals a day rather than 3 large meals.  Do not eat within 3 hours of laying down.  Prop head of bed up on wood or bricks to create a 6 inch incline. Follow-up after surveillance colonoscopy as discussed below.

## 2020-02-17 NOTE — Assessment & Plan Note (Signed)
Patient had been struggling with intractable nausea/vomiting/weight loss of unclear etiology.  Multiple imaging studies including CT head, MRI brain, CT A/P without acute findings.  EGD 10/01/2019: normal esophagus status post esophageal dilation for dysphagia, erythematous mucosa in the stomach consistent with reactive gastropathy on biopsy, no H. pylori, multiple fundic gland polyps based on biopsy.  GES 11/15/2019 within normal limits.  GERD fairly well controlled on Protonix twice daily.  She started on Marinol in March 2021 and has had significant improvement/resolution of symptoms.  She has gained about 10 pounds since February 2021.    Advise she continue her current medications.  She will follow up in the office after her surveillance colonoscopy as discussed below.

## 2020-02-17 NOTE — Assessment & Plan Note (Addendum)
History of rectal cancer s/p resection in 2003.  Last colonoscopy in May 2016 with normal residual rectum and colon, surgical anastomosis at approximately 5 cm from the anal verge.  She is currently due for surveillance colonoscopy.  No significant lower GI symptoms.  She does report having a hemorrhoid that was prolapsed several weeks ago with associated low-volume toilet tissue hematochezia.  Symptoms resolved after using Preparation H.  Also with history of alternating constipation and diarrhea but BMs are currently daily and typically Bristol 4.  She will use Linzess 72 mcg as needed for intermittent constipation.  Family history significant for paternal grandfather with colon cancer in his 101s, brother and cousin with colon polyps.  Proceed with TCS with propofol with Dr. Gala Romney in the near future. The risks, benefits, and alternatives have been discussed in detail with patient. They have stated understanding and desire to proceed.  Hold iron x7 days prior to procedure. Follow-up after procedure.

## 2020-02-17 NOTE — Assessment & Plan Note (Signed)
History of alternating constipation and diarrhea.  Currently, BMs are daily and typically Bristol 4.  She will use Linzess 72 mcg as needed for constipation which she has not required in quite some time.  Advised she continue to monitor symptoms, continue Linzess 72 mcg as needed, and follow-up after surveillance colonoscopy as discussed above.

## 2020-02-17 NOTE — Assessment & Plan Note (Signed)
Overall improved s/p EGD on 10/01/2019 revealing normal esophagus with empiric dilation.  She does report continuing to have some trouble with pills but does fairly well now that potassium has been changed to a liquid and she is only taking 1 or 2 pills at a time.  No trouble with foods or liquids.  Discussed pursuing BPE for further evaluation versus monitoring.  Patient prefers to continue to monitor for now as symptoms are overall improved.  She will follow up in the office after surveillance colonoscopy as discussed below.

## 2020-02-18 NOTE — Progress Notes (Signed)
Cc'ed to pcp °

## 2020-03-06 ENCOUNTER — Telehealth: Payer: Self-pay | Admitting: *Deleted

## 2020-03-06 NOTE — Telephone Encounter (Signed)
Called patient to schedule TCS with propofol RMR, ASA II  She states she needed weekend appt. I advised we do not do procedures on the weekend. Offered 8/5, 8/12, 8/30. Patient states she is working right now during the week and she is not sure when she can schedule. Patient states she is unable to schedule right now. She is going to call us when she is ready to schedule. FYI to [facility]

## 2020-03-06 NOTE — Telephone Encounter (Signed)
Noted. She doesn't have follow-up appointment. If she would like to go ahead and schedule a follow-up so we do not lose track of her, we can. Would offer 6 month follow-up.   Linda English, please go ahead and arrange follow- up if patient would like. Otherwise, we will wait to hear from her when she is ready to schedule procedure.

## 2020-03-10 ENCOUNTER — Encounter: Payer: Self-pay | Admitting: Internal Medicine

## 2020-03-10 NOTE — Telephone Encounter (Signed)
SCHEDULED PATIENT FOR FOLLOW UP

## 2020-03-10 NOTE — Telephone Encounter (Signed)
Noted  

## 2020-03-18 ENCOUNTER — Telehealth: Payer: Self-pay | Admitting: *Deleted

## 2020-03-18 NOTE — Telephone Encounter (Signed)
Yes, I feel she needs to discuss further with PCP. SOB today and loss of sense of taste is concerning for COVID. If she has any significant shortness of breath, fever, feeling like she will pass out, she should proceed to the ER. Further recommendations per PCP.

## 2020-03-18 NOTE — Telephone Encounter (Signed)
Called pt and informed her that [facility] suspects she will need to see PCP. Recommend she call PCP today.   Pt voiced understanding and answered questions below as follows:   Is she eating and drinking normally? Yes Is her urine dark or pale yellow to clear?  Clear Any nausea/vomiting?  Nausea today and yesterday Does she have any white lesions on her tongue. No, but white and dry, no spots Any trouble breathing? SOB today Any difficulty swallowing other than occasional trouble with pills, which she had when I saw her in the office? Swallows food and liquid good, takes effort to swallow since saliva seems thick to her Any significant lightheadedness, dizziness, or feeling like she will pass out? No, has in the past but not recently

## 2020-03-18 NOTE — Telephone Encounter (Signed)
Called pt and informed her that she needs to discuss further with PCP. She was advised of [facility]'s recommendations.  SOB today and loss of sense of taste is concerning for COVID. She was advised if she has any significant shortness of breath, fever, feeling like she will pass out, she should proceed to the ER. Further recommendations per PCP.  Pt voiced understanding.

## 2020-03-18 NOTE — Telephone Encounter (Signed)
Suspect she will need to see PCP. Recommend she call PCP today.   Is she eating and drinking normally? Is her urine dark or pale yellow to clear? Any nausea/vomiting? Does she have any white lesions on her tongue. Any trouble breathing? Any difficulty swallowing other than occasional trouble with pills, which she had when I saw her in the office? Any significant lightheadedness, dizziness, or feeling like she will pass out?

## 2020-03-18 NOTE — Telephone Encounter (Signed)
Pt called in complaining of burnt feeling on tongue and mouth.  Lips feels spongy and throat hurts.  Tongue sore and dry.  Loss of taste and feels dehydrated.  Pt feels like she is chewing on her teeth.  Pt wants to know if she should see [place] or PCP?  Has not been put on any new medications lately.  [phone]

## 2020-03-19 DIAGNOSIS — K146 Glossodynia: Secondary | ICD-10-CM | POA: Diagnosis not present

## 2020-03-24 DIAGNOSIS — Z1231 Encounter for screening mammogram for malignant neoplasm of breast: Secondary | ICD-10-CM | POA: Diagnosis not present

## 2020-04-10 DIAGNOSIS — R232 Flushing: Secondary | ICD-10-CM | POA: Diagnosis not present

## 2020-04-14 DIAGNOSIS — K3184 Gastroparesis: Secondary | ICD-10-CM | POA: Diagnosis not present

## 2020-04-14 DIAGNOSIS — Z79899 Other long term (current) drug therapy: Secondary | ICD-10-CM | POA: Diagnosis not present

## 2020-04-14 DIAGNOSIS — I952 Hypotension due to drugs: Secondary | ICD-10-CM | POA: Diagnosis not present

## 2020-04-14 DIAGNOSIS — E1143 Type 2 diabetes mellitus with diabetic autonomic (poly)neuropathy: Secondary | ICD-10-CM | POA: Diagnosis not present

## 2020-04-14 DIAGNOSIS — E876 Hypokalemia: Secondary | ICD-10-CM | POA: Diagnosis not present

## 2020-04-14 DIAGNOSIS — R61 Generalized hyperhidrosis: Secondary | ICD-10-CM | POA: Diagnosis not present

## 2020-04-15 ENCOUNTER — Other Ambulatory Visit (HOSPITAL_COMMUNITY): Payer: Self-pay | Admitting: Internal Medicine

## 2020-04-15 ENCOUNTER — Other Ambulatory Visit: Payer: Self-pay

## 2020-04-15 ENCOUNTER — Ambulatory Visit (HOSPITAL_COMMUNITY)
Admission: RE | Admit: 2020-04-15 | Discharge: 2020-04-15 | Disposition: A | Discharge status: Home / Self Care | Payer: Medicare Other | Source: Ambulatory Visit | Attending: Internal Medicine | Admitting: Internal Medicine

## 2020-04-15 DIAGNOSIS — R079 Chest pain, unspecified: Secondary | ICD-10-CM | POA: Diagnosis not present

## 2020-04-15 DIAGNOSIS — R61 Generalized hyperhidrosis: Secondary | ICD-10-CM | POA: Diagnosis not present

## 2020-04-16 ENCOUNTER — Telehealth: Payer: Self-pay | Admitting: Internal Medicine

## 2020-04-16 NOTE — Telephone Encounter (Signed)
Per phone note 03/06/20, Mindy spoke to pt and she declined to schedule TCS.  Called pt, she said Mindy must have spoke to her daughter and her daughter didn't tell her our office had called. TCS w/Propofol ASA 2 w/Dr. Gala Romney scheduled for 05/01/20 at 2:45pm. Pt aware to hold Iron for 7 days prior to TCS. COVID test 04/30/20. Appt letter and procedure instructions mailed.

## 2020-04-16 NOTE — Telephone Encounter (Signed)
Pt said she was last seen in June by Aliene Altes, PA and was told she would be scheduled a procedure, but she hasn't heard from [place]. Please advise. [phone]

## 2020-04-17 ENCOUNTER — Telehealth: Payer: Self-pay | Admitting: Internal Medicine

## 2020-04-17 NOTE — Telephone Encounter (Signed)
Called pt, question answered. ?

## 2020-04-17 NOTE — Telephone Encounter (Signed)
PLEASE CALL PATIENT, SHE HAS QUESTIONS ABOUT HER PROCEDURE  [phone]

## 2020-04-22 ENCOUNTER — Encounter (HOSPITAL_COMMUNITY)
Admission: RE | Admit: 2020-04-22 | Discharge: 2020-04-22 | Disposition: A | Discharge status: Home / Self Care | Payer: Medicare Other | Source: Ambulatory Visit | Attending: Internal Medicine | Admitting: Internal Medicine

## 2020-04-22 ENCOUNTER — Other Ambulatory Visit: Payer: Self-pay

## 2020-04-25 DIAGNOSIS — K146 Glossodynia: Secondary | ICD-10-CM | POA: Insufficient documentation

## 2020-04-30 ENCOUNTER — Other Ambulatory Visit (HOSPITAL_COMMUNITY)
Admission: RE | Admit: 2020-04-30 | Discharge: 2020-04-30 | Disposition: A | Discharge status: Home / Self Care | Payer: Medicare Other | Source: Ambulatory Visit | Attending: Internal Medicine | Admitting: Internal Medicine

## 2020-04-30 ENCOUNTER — Telehealth: Payer: Self-pay

## 2020-04-30 ENCOUNTER — Other Ambulatory Visit: Payer: Self-pay

## 2020-04-30 DIAGNOSIS — Z20822 Contact with and (suspected) exposure to covid-19: Secondary | ICD-10-CM | POA: Insufficient documentation

## 2020-04-30 DIAGNOSIS — Z01812 Encounter for preprocedural laboratory examination: Secondary | ICD-10-CM | POA: Diagnosis not present

## 2020-04-30 LAB — SARS CORONAVIRUS 2 (TAT 6-24 HRS): SARS Coronavirus 2: NEGATIVE

## 2020-04-30 NOTE — Telephone Encounter (Signed)
Tried to call pt to see if she can arrive earlier tomorrow for TCS, no answer, LMOVM for return call. Endo scheduler informed.

## 2020-05-01 ENCOUNTER — Ambulatory Visit (HOSPITAL_COMMUNITY): Payer: Medicare Other | Admitting: Anesthesiology

## 2020-05-01 ENCOUNTER — Ambulatory Visit (HOSPITAL_COMMUNITY)
Admission: RE | Admit: 2020-05-01 | Discharge: 2020-05-01 | Disposition: A | Discharge status: Home / Self Care | Payer: Medicare Other | Attending: Internal Medicine | Admitting: Internal Medicine

## 2020-05-01 ENCOUNTER — Encounter (HOSPITAL_COMMUNITY): Payer: Self-pay | Admitting: Internal Medicine

## 2020-05-01 ENCOUNTER — Other Ambulatory Visit: Payer: Self-pay

## 2020-05-01 ENCOUNTER — Encounter (HOSPITAL_COMMUNITY)
Admission: RE | Disposition: A | Discharge status: Home / Self Care | Payer: Self-pay | Source: Home / Self Care | Attending: Internal Medicine

## 2020-05-01 DIAGNOSIS — K5730 Diverticulosis of large intestine without perforation or abscess without bleeding: Secondary | ICD-10-CM | POA: Diagnosis not present

## 2020-05-01 DIAGNOSIS — F410 Panic disorder [episodic paroxysmal anxiety] without agoraphobia: Secondary | ICD-10-CM | POA: Diagnosis not present

## 2020-05-01 DIAGNOSIS — Z1211 Encounter for screening for malignant neoplasm of colon: Secondary | ICD-10-CM | POA: Insufficient documentation

## 2020-05-01 DIAGNOSIS — Z09 Encounter for follow-up examination after completed treatment for conditions other than malignant neoplasm: Secondary | ICD-10-CM | POA: Diagnosis not present

## 2020-05-01 DIAGNOSIS — I110 Hypertensive heart disease with heart failure: Secondary | ICD-10-CM | POA: Insufficient documentation

## 2020-05-01 DIAGNOSIS — K219 Gastro-esophageal reflux disease without esophagitis: Secondary | ICD-10-CM | POA: Insufficient documentation

## 2020-05-01 DIAGNOSIS — Z7982 Long term (current) use of aspirin: Secondary | ICD-10-CM | POA: Diagnosis not present

## 2020-05-01 DIAGNOSIS — Z85048 Personal history of other malignant neoplasm of rectum, rectosigmoid junction, and anus: Secondary | ICD-10-CM | POA: Insufficient documentation

## 2020-05-01 DIAGNOSIS — K589 Irritable bowel syndrome without diarrhea: Secondary | ICD-10-CM | POA: Diagnosis not present

## 2020-05-01 DIAGNOSIS — D122 Benign neoplasm of ascending colon: Secondary | ICD-10-CM | POA: Insufficient documentation

## 2020-05-01 DIAGNOSIS — Z86010 Personal history of colonic polyps: Secondary | ICD-10-CM | POA: Insufficient documentation

## 2020-05-01 DIAGNOSIS — E119 Type 2 diabetes mellitus without complications: Secondary | ICD-10-CM | POA: Insufficient documentation

## 2020-05-01 DIAGNOSIS — G20 Parkinson's disease: Secondary | ICD-10-CM | POA: Diagnosis not present

## 2020-05-01 DIAGNOSIS — F319 Bipolar disorder, unspecified: Secondary | ICD-10-CM | POA: Diagnosis not present

## 2020-05-01 DIAGNOSIS — I509 Heart failure, unspecified: Secondary | ICD-10-CM | POA: Insufficient documentation

## 2020-05-01 DIAGNOSIS — K635 Polyp of colon: Secondary | ICD-10-CM

## 2020-05-01 DIAGNOSIS — Z85038 Personal history of other malignant neoplasm of large intestine: Secondary | ICD-10-CM | POA: Diagnosis not present

## 2020-05-01 DIAGNOSIS — G4733 Obstructive sleep apnea (adult) (pediatric): Secondary | ICD-10-CM | POA: Diagnosis not present

## 2020-05-01 DIAGNOSIS — M1990 Unspecified osteoarthritis, unspecified site: Secondary | ICD-10-CM | POA: Diagnosis not present

## 2020-05-01 DIAGNOSIS — Z79899 Other long term (current) drug therapy: Secondary | ICD-10-CM | POA: Diagnosis not present

## 2020-05-01 HISTORY — PX: COLONOSCOPY WITH PROPOFOL: SHX5780

## 2020-05-01 HISTORY — PX: POLYPECTOMY: SHX5525

## 2020-05-01 LAB — GLUCOSE, CAPILLARY: Glucose-Capillary: 76 mg/dL (ref 70–99)

## 2020-05-01 SURGERY — COLONOSCOPY WITH PROPOFOL
Anesthesia: General

## 2020-05-01 MED ORDER — STERILE WATER FOR IRRIGATION IR SOLN
Status: DC | PRN
  Administered 2020-05-01: 1.5 mL

## 2020-05-01 MED ORDER — PROPOFOL 10 MG/ML IV BOLUS
INTRAVENOUS | Status: DC | PRN
  Administered 2020-05-01: 50 mg via INTRAVENOUS
  Administered 2020-05-01: 40 mg via INTRAVENOUS

## 2020-05-01 MED ORDER — CHLORHEXIDINE GLUCONATE CLOTH 2 % EX PADS: 6.0000 | MEDICATED_PAD | Freq: Once | CUTANEOUS | Status: DC

## 2020-05-01 MED ORDER — LACTATED RINGERS IV SOLN: INTRAVENOUS | Status: DC | PRN

## 2020-05-01 MED ORDER — LACTATED RINGERS IV SOLN: Freq: Once | INTRAVENOUS | Status: AC

## 2020-05-01 MED ORDER — PROPOFOL 500 MG/50ML IV EMUL
INTRAVENOUS | Status: DC | PRN
  Administered 2020-05-01: 150 ug/kg/min via INTRAVENOUS

## 2020-05-01 NOTE — Discharge Instructions (Signed)
Colonoscopy Discharge Instructions  Read the instructions outlined below and refer to this sheet in the next few weeks. These discharge instructions provide you with general information on caring for yourself after you leave the hospital. Your doctor may also give you specific instructions. While your treatment has been planned according to the most current medical practices available, unavoidable complications occasionally occur. If you have any problems or questions after discharge, call Dr. Gala Romney at [phone]. ACTIVITY  You may resume your regular activity, but move at a slower pace for the next 24 hours.   Take frequent rest periods for the next 24 hours.   Walking will help get rid of the air and reduce the bloated feeling in your belly (abdomen).   No driving for 24 hours (because of the medicine (anesthesia) used during the test).    Do not sign any important legal documents or operate any machinery for 24 hours (because of the anesthesia used during the test).  NUTRITION  Drink plenty of fluids.   You may resume your normal diet as instructed by your doctor.   Begin with a light meal and progress to your normal diet. Heavy or fried foods are harder to digest and may make you feel sick to your stomach (nauseated).   Avoid alcoholic beverages for 24 hours or as instructed.  MEDICATIONS  You may resume your normal medications unless your doctor tells you otherwise.  WHAT YOU CAN EXPECT TODAY  Some feelings of bloating in the abdomen.   Passage of more gas than usual.   Spotting of blood in your stool or on the toilet paper.  IF YOU HAD POLYPS REMOVED DURING THE COLONOSCOPY:  No aspirin products for 7 days or as instructed.   No alcohol for 7 days or as instructed.   Eat a soft diet for the next 24 hours.  FINDING OUT THE RESULTS OF YOUR TEST Not all test results are available during your visit. If your test results are not back during the visit, make an appointment  with your caregiver to find out the results. Do not assume everything is normal if you have not heard from your caregiver or the medical facility. It is important for you to follow up on all of your test results.  SEEK IMMEDIATE MEDICAL ATTENTION IF:  You have more than a spotting of blood in your stool.   Your belly is swollen (abdominal distention).   You are nauseated or vomiting.   You have a temperature over 101.   You have abdominal pain or discomfort that is severe or gets worse throughout the day.   2 polyps removed from your colon today  Polyp and diverticulosis information provided  Further recommendations to follow pending review of pathology report  At patient request I called Alaila Pillard at [phone] -reviewed findings.    Diverticulosis  Diverticulosis is a condition that develops when small pouches (diverticula) form in the wall of the large intestine (colon). The colon is where water is absorbed and stool (feces) is formed. The pouches form when the inside layer of the colon pushes through weak spots in the outer layers of the colon. You may have a few pouches or many of them. The pouches usually do not cause problems unless they become inflamed or infected. When this happens, the condition is called diverticulitis. What are the causes? The cause of this condition is not known. What increases the risk? The following factors may make you more likely to develop this condition:  Being older than age 56. Your risk for this condition increases with age. Diverticulosis is rare among people younger than age 24. By age 24, many people have it.  Eating a low-fiber diet.  Having frequent constipation.  Being overweight.  Not getting enough exercise.  Smoking.  Taking over-the-counter pain medicines, like aspirin and ibuprofen.  Having a family history of diverticulosis. What are the signs or symptoms? In most people, there are no symptoms of this  condition. If you do have symptoms, they may include:  Bloating.  Cramps in the abdomen.  Constipation or diarrhea.  Pain in the lower left side of the abdomen. How is this diagnosed? Because diverticulosis usually has no symptoms, it is most often diagnosed during an exam for other colon problems. The condition may be diagnosed by:  Using a flexible scope to examine the colon (colonoscopy).  Taking an X-ray of the colon after dye has been put into the colon (barium enema).  Having a CT scan. How is this treated? You may not need treatment for this condition. Your health care provider may recommend treatment to prevent problems. You may need treatment if you have symptoms or if you previously had diverticulitis. Treatment may include:  Eating a high-fiber diet.  Taking a fiber supplement.  Taking a live bacteria supplement (probiotic).  Taking medicine to relax your colon. Follow these instructions at home: Medicines  Take over-the-counter and prescription medicines only as told by your health care provider.  If told by your health care provider, take a fiber supplement or probiotic. Constipation prevention Your condition may cause constipation. To prevent or treat constipation, you may need to:  Drink enough fluid to keep your urine pale yellow.  Take over-the-counter or prescription medicines.  Eat foods that are high in fiber, such as beans, whole grains, and fresh fruits and vegetables.  Limit foods that are high in fat and processed sugars, such as fried or sweet foods.  General instructions  Try not to strain when you have a bowel movement.  Keep all follow-up visits as told by your health care provider. This is important. Contact a health care provider if you:  Have pain in your abdomen.  Have bloating.  Have cramps.  Have not had a bowel movement in 3 days. Get help right away if:  Your pain gets worse.  Your bloating becomes very bad.  You have  a fever or chills, and your symptoms suddenly get worse.  You vomit.  You have bowel movements that are bloody or black.  You have bleeding from your rectum. Summary  Diverticulosis is a condition that develops when small pouches (diverticula) form in the wall of the large intestine (colon).  You may have a few pouches or many of them.  This condition is most often diagnosed during an exam for other colon problems.  Treatment may include increasing the fiber in your diet, taking supplements, or taking medicines. This information is not intended to replace advice given to you by your health care provider. Make sure you discuss any questions you have with your health care provider. Document Revised: 03/15/2019 Document Reviewed: 03/15/2019 Elsevier Patient Education  [place].    Colon Polyps  Polyps are tissue growths inside the body. Polyps can grow in many places, including the large intestine (colon). A polyp may be a round bump or a mushroom-shaped growth. You could have one polyp or several. Most colon polyps are noncancerous (benign). However, some colon polyps can become cancerous  over time. Finding and removing the polyps early can help prevent this. What are the causes? The exact cause of colon polyps is not known. What increases the risk? You are more likely to develop this condition if you:  Have a family history of colon cancer or colon polyps.  Are older than 92 or older than 45 if you are African American.  Have inflammatory bowel disease, such as ulcerative colitis or Crohn's disease.  Have certain hereditary conditions, such as: ? Familial adenomatous polyposis. ? Lynch syndrome. ? Turcot syndrome. ? Peutz-Jeghers syndrome.  Are overweight.  Smoke cigarettes.  Do not get enough exercise.  Drink too much alcohol.  Eat a diet that is high in fat and red meat and low in fiber.  Had childhood cancer that was treated with abdominal  radiation. What are the signs or symptoms? Most polyps do not cause symptoms. If you have symptoms, they may include:  Blood coming from your rectum when having a bowel movement.  Blood in your stool. The stool may look dark red or black.  Abdominal pain.  A change in bowel habits, such as constipation or diarrhea. How is this diagnosed? This condition is diagnosed with a colonoscopy. This is a procedure in which a lighted, flexible scope is inserted into the anus and then passed into the colon to examine the area. Polyps are sometimes found when a colonoscopy is done as part of routine cancer screening tests. How is this treated? Treatment for this condition involves removing any polyps that are found. Most polyps can be removed during a colonoscopy. Those polyps will then be tested for cancer. Additional treatment may be needed depending on the results of testing. Follow these instructions at home: Lifestyle  Maintain a healthy weight, or lose weight if recommended by your health care provider.  Exercise every day or as told by your health care provider.  Do not use any products that contain nicotine or tobacco, such as cigarettes and e-cigarettes. If you need help quitting, ask your health care provider.  If you drink alcohol, limit how much you have: ? 0-1 drink a day for women. ? 0-2 drinks a day for men.  Be aware of how much alcohol is in your drink. In the [place] one drink equals one 12 oz bottle of beer (355 mL), one 5 oz glass of wine (148 mL), or one 1 oz shot of hard liquor (44 mL). Eating and drinking   Eat foods that are high in fiber, such as fruits, vegetables, and whole grains.  Eat foods that are high in calcium and vitamin D, such as milk, cheese, yogurt, eggs, liver, fish, and broccoli.  Limit foods that are high in fat, such as fried foods and desserts.  Limit the amount of red meat and processed meat you eat, such as hot dogs, sausage, bacon, and lunch  meats. General instructions  Keep all follow-up visits as told by your health care provider. This is important. ? This includes having regularly scheduled colonoscopies. ? Talk to your health care provider about when you need a colonoscopy. Contact a health care provider if:  You have new or worsening bleeding during a bowel movement.  You have new or increased blood in your stool.  You have a change in bowel habits.  You lose weight for no known reason. Summary  Polyps are tissue growths inside the body. Polyps can grow in many places, including the colon.  Most colon polyps are noncancerous (benign), but some  can become cancerous over time.  This condition is diagnosed with a colonoscopy.  Treatment for this condition involves removing any polyps that are found. Most polyps can be removed during a colonoscopy. This information is not intended to replace advice given to you by your health care provider. Make sure you discuss any questions you have with your health care provider. Document Revised: 12/01/2017 Document Reviewed: 12/01/2017 Elsevier Patient Education  [place] POST-ANESTHESIA  IMMEDIATELY FOLLOWING SURGERY:  Do not drive or operate machinery for the first twenty four hours after surgery.  Do not make any important decisions for twenty four hours after surgery or while taking narcotic pain medications or sedatives.  If you develop intractable nausea and vomiting or a severe headache please notify your doctor immediately.  FOLLOW-UP:  Please make an appointment with your surgeon as instructed. You do not need to follow up with anesthesia unless specifically instructed to do so.  WOUND CARE INSTRUCTIONS (if applicable):  Keep a dry clean dressing on the anesthesia/puncture wound site if there is drainage.  Once the wound has quit draining you may leave it open to air.  Generally you should leave the bandage intact for twenty four  hours unless there is drainage.  If the epidural site drains for more than 36-48 hours please call the anesthesia department.  QUESTIONS?:  Please feel free to call your physician or the hospital operator if you have any questions, and they will be happy to assist you.

## 2020-05-01 NOTE — Op Note (Signed)
[facility] Patient Name: Linda English Procedure Date: 05/01/2020 1:33 PM MRN: [id number] Date of Birth: 10/31/1955 Attending MD: Norvel Richards , MD CSN: [id number] Age: 64 Admit Type: Outpatient Procedure:                Colonoscopy Indications:              High risk colon cancer surveillance: Personal                            history of colon cancer Providers:                Norvel Richards, MD, Caprice Kluver, Kristine L.                            Risa Grill, Technician, Randa Spike, [employer] Referring MD:              Medicines:                Propofol per Anesthesia Complications:            No immediate complications. Estimated Blood Loss:     Estimated blood loss was minimal. Procedure:                Pre-Anesthesia Assessment:                           - Prior to the procedure, a History and Physical                            was performed, and patient medications and                            allergies were reviewed. The patient's tolerance of                            previous anesthesia was also reviewed. The risks                            and benefits of the procedure and the sedation                            options and risks were discussed with the patient.                            All questions were answered, and informed consent                            was obtained. Prior Anticoagulants: The patient has                            taken no previous anticoagulant or antiplatelet                            agents. ASA Grade Assessment: II - A patient with  mild systemic disease. After reviewing the risks                            and benefits, the patient was deemed in                            satisfactory condition to undergo the procedure.                           After obtaining informed consent, the colonoscope                            was passed under direct vision. Throughout the                             procedure, the patient's blood pressure, pulse, and                            oxygen saturations were monitored continuously. The                            CF-[id number] ([id number]) scope was introduced through                            the anus and advanced to the the cecum, identified                            by appendiceal orifice and ileocecal valve. The                            colonoscopy was performed without difficulty. The                            patient tolerated the procedure well. The quality                            of the bowel preparation was adequate. Scope In: 3:01:29 PM Scope Out: 3:15:28 PM Scope Withdrawal Time: 0 hours 7 minutes 43 seconds  Total Procedure Duration: 0 hours 13 minutes 59 seconds  Findings:      The perianal and digital rectal examinations were normal.      Two sessile polyps were found in the ascending colon. The polyps were 3       to 8 mm in size. These polyps were removed with a cold snare. Resection       and retrieval were complete. Estimated blood loss was minimal.      Scattered medium-mouthed diverticula were found in the descending colon.       Surgical anastomosis identified at approximately 4 cm from the anal       verge.      The exam was otherwise without abnormality on direct and retroflexion       views. Impression:               - Two 3 to 8 mm polyps in the ascending colon,  removed with a cold snare. Resected and retrieved.                           - Diverticulosis in the descending colon. Surgical                            anastomosis 4 cm                           - The examination was otherwise normal on direct                            and retroflexion views. Moderate Sedation:      Moderate (conscious) sedation was personally administered by an       anesthesia professional. The following parameters were monitored: oxygen       saturation, heart rate, blood pressure,  respiratory rate, EKG, adequacy       of pulmonary ventilation, and response to care. Recommendation:           - Patient has a contact number available for                            emergencies. The signs and symptoms of potential                            delayed complications were discussed with the                            patient. Return to normal activities tomorrow.                            Written discharge instructions were provided to the                            patient.                           - Resume previous diet.                           - Continue present medications.                           - Repeat colonoscopy date to be determined after                            pending pathology results are reviewed for                            surveillance based on pathology results.                           - Return to GI office (date not yet determined). Procedure Code(s):        --- Professional ---  45385, Colonoscopy, flexible; with removal of                            tumor(s), polyp(s), or other lesion(s) by snare                            technique Diagnosis Code(s):        --- Professional ---                           G31.517, Personal history of other malignant                            neoplasm of large intestine                           K63.5, Polyp of colon                           K57.30, Diverticulosis of large intestine without                            perforation or abscess without bleeding CPT copyright 2019 American Medical Association. All rights reserved. The codes documented in this report are preliminary and upon coder review may  be revised to meet current compliance requirements. Cristopher Estimable. Jessyca Sloan, MD Norvel Richards, MD 05/01/2020 3:27:04 PM This report has been signed electronically. Number of Addenda: 0

## 2020-05-01 NOTE — Transfer of Care (Signed)
Immediate Anesthesia Transfer of Care Note  Patient: Linda English  Procedure(s) Performed: COLONOSCOPY WITH PROPOFOL (N/A ) POLYPECTOMY  Patient Location: PACU and Endoscopy Unit  Anesthesia Type:General  Level of Consciousness: awake  Airway & Oxygen Therapy: Patient Spontanous Breathing  Post-op Assessment: Report given to RN  Post vital signs: Reviewed and stable  Last Vitals:  Vitals Value Taken Time  BP    Temp 36.7 C 05/01/20 1521  Pulse    Resp 18 05/01/20 1521  SpO2 100 % 05/01/20 1521    Last Pain:  Vitals:   05/01/20 1521  TempSrc: Oral  PainSc: 0-No pain      Patients Stated Pain Goal: 7 (16/10/96 0454)  Complications: No complications documented.

## 2020-05-01 NOTE — Anesthesia Preprocedure Evaluation (Addendum)
Anesthesia Evaluation  Patient identified by MRN, date of birth, ID band Patient awake    Reviewed: Allergy & Precautions, NPO status , Patient's Chart, lab work & pertinent test results  History of Anesthesia Complications Negative for: history of anesthetic complications  Airway Mallampati: II  TM Distance: >3 FB Neck ROM: Full    Dental  (+) Teeth Intact, Dental Advisory Given, Chipped,    Pulmonary shortness of breath and with exertion, sleep apnea ,    Pulmonary exam normal breath sounds clear to auscultation       Cardiovascular Exercise Tolerance: Good hypertension, Pt. on medications +CHF   Rhythm:Regular Rate:Normal + Diastolic murmurs 1. Left ventricular ejection fraction, by estimation, is 55 to 60%. The  left ventricle has normal function. Left ventricular endocardial border not optimally defined to evaluate regional wall motion. Left ventricular diastolic parameters are indeterminate.  2. Right ventricular systolic function is normal. The right ventricular size is normal. There is normal pulmonary artery systolic pressure.  3. The mitral valve is normal in structure and function. Mild mitral valve regurgitation. No evidence of mitral stenosis.  4. The aortic valve is tricuspid. Aortic valve regurgitation is moderate   Neuro/Psych  Headaches, PSYCHIATRIC DISORDERS Anxiety Depression Bipolar Disorder    GI/Hepatic hiatal hernia, PUD, GERD  Medicated,  Endo/Other  diabetes, Well Controlled, Type 2, Oral Hypoglycemic Agents  Renal/GU Renal InsufficiencyRenal disease     Musculoskeletal  (+) Arthritis , Osteoarthritis,    Abdominal   Peds  Hematology   Anesthesia Other Findings   Reproductive/Obstetrics                           Anesthesia Physical Anesthesia Plan  ASA: III  Anesthesia Plan: General   Post-op Pain Management:    Induction: Intravenous  PONV Risk Score and  Plan: TIVA  Airway Management Planned: Nasal Cannula, Natural Airway and Simple Face Mask  Additional Equipment:   Intra-op Plan:   Post-operative Plan:   Informed Consent: I have reviewed the patients History and Physical, chart, labs and discussed the procedure including the risks, benefits and alternatives for the proposed anesthesia with the patient or authorized representative who has indicated his/her understanding and acceptance.     Dental advisory given  Plan Discussed with: CRNA and Surgeon  Anesthesia Plan Comments:        Anesthesia Quick Evaluation

## 2020-05-01 NOTE — Anesthesia Postprocedure Evaluation (Signed)
Anesthesia Post Note  Patient: Linda English  Procedure(s) Performed: COLONOSCOPY WITH PROPOFOL (N/A ) POLYPECTOMY  Patient location during evaluation: PACU Anesthesia Type: General Level of consciousness: awake and alert and oriented Pain management: pain level controlled Vital Signs Assessment: post-procedure vital signs reviewed and stable Respiratory status: spontaneous breathing Cardiovascular status: blood pressure returned to baseline and stable Postop Assessment: no apparent nausea or vomiting Anesthetic complications: no   No complications documented.   Last Vitals:  Vitals:   05/01/20 1339 05/01/20 1521  BP: (!) 161/80 110/70  Pulse: 63   Resp: 16 18  Temp: 37 C 36.7 C  SpO2: 99% 100%    Last Pain:  Vitals:   05/01/20 1521  TempSrc: Oral  PainSc: 0-No pain                 Papa [place]field

## 2020-05-01 NOTE — H&P (Signed)
@LOGO @   Primary Care Physician:  [name], MD Primary Gastroenterologist:  Dr. Gala Romney  Pre-Procedure History & Physical: HPI:  Linda English is a 64 y.o. female here for surveillance colonoscopy.  History of a low anterior resection for rectal cancer 2003 - subsequent follow-up examinations; last examination diverticulosis 2016 anastomosis at 3 to 5 cm. Clinically doing well from a bowel standpoint.  Past Medical History:  Diagnosis Date  . Acid reflux   . Allergic rhinitis   . Anxiety   . Arthritis    osteoarthritis  . Bipolar disorder ([place])   . Chronic headache   . Depression   . Diverticula of colon   . DM (diabetes mellitus) ([place])   . Gastric erosions   . Gastric polyps    benign   . Hemorrhoids   . Hiatal hernia   . Hypertension   . IBS (irritable bowel syndrome)   . Lichen planus   . Obstructive sleep apnea   . Panic disorder   . Parkinson's disease ([place])   . Rectal cancer ([place]) 2003   ileostomy and reversal  . Subdural hematoma ([place])   . Tubular adenoma     Past Surgical History:  Procedure Laterality Date  . ABDOMINAL HYSTERECTOMY    . APPENDECTOMY    . BACK SURGERY    . BIOPSY  10/01/2019   Procedure: BIOPSY;  Surgeon: Daneil Dolin, MD;  [facility]: [facility];  Service: Endoscopy;;  gastric  . CESAREAN SECTION     X  2  . CHOLECYSTECTOMY    . COLONOSCOPY  08/2007   DR Gala Romney, friable anal canal hemorrhoids, surgical anastomosis at 3cm, distal scattered tics  . COLONOSCOPY  12/15/2010   anal papilla and internal hemorrhoids/diminutive polyp in the base of the cecum. Past, tubular adenoma.. Next colonoscopy due in April 2017.  Marland Kitchen COLONOSCOPY  10/27/2005   RMR: Anal canal hemorrhoids. Surgical anastomosis at 3 cm from the anal verge appeared normal. Few scattered distal diverticula. The residual  colonic mucosa appeared normal. I suspect the patient bled from hemorrhoids  . COLONOSCOPY N/A 01/15/2015   JJO:ACZYSA residual rectum and colon. next  tcs 12/2019  . ESOPHAGEAL DILATION N/A 06/27/2015   Procedure: ESOPHAGEAL DILATION;  Surgeon: Daneil Dolin, MD;  [facility]: [facility];  Service: Endoscopy;  Laterality: N/A;  . ESOPHAGOGASTRODUODENOSCOPY  05/2002   DR Elona Yinger, normal  . ESOPHAGOGASTRODUODENOSCOPY  08/03/2011   RMR: small HH/ gastric polyps  . ESOPHAGOGASTRODUODENOSCOPY N/A 06/27/2015   Dr. Gala Romney: Mild erosive reflux esophagitits. Status post passage of a Maloney dilator. Hiatal hernia. Gastric polyps and abnormal gastric mucosa of doubtful clinical significane. status post biopsy, benign fundic gland polyp, negative H.pylori  . ESOPHAGOGASTRODUODENOSCOPY (EGD) WITH PROPOFOL N/A 10/01/2019   Priscila Bean: Normal esophagus status post esophageal dilation for dysphagia, erythematous mucosa in the stomach biopsy consistent with reactive gastropathy, no H. pylori, multiple gastric polyps (biopsy fundic gland)  . low anterior rection  2003   RECTAL CANCER  . MALONEY DILATION N/A 10/01/2019   Procedure: Venia Minks DILATION;  Surgeon: Daneil Dolin, MD;  [facility];  Service: Endoscopy;  Laterality: N/A;  . SPINE SURGERY  2001   L5,S1 HEMILAMINOTOMY AND DISCECTOMY/DR DEATON    Prior to Admission medications   Medication Sig Start Date End Date Taking? Authorizing Provider  acetaminophen (TYLENOL) 500 MG tablet Take 1,000 mg by mouth every 8 (eight) hours as needed for moderate pain or headache.   Yes [provider]  aspirin 81 MG chewable tablet Chew 1 tablet (81 mg total) by mouth daily. 01/20/20  Yes Tat, Shanon Brow, MD  diltiazem (CARDIZEM CD) 120 MG 24 hr capsule Take 1 capsule (120 mg total) by mouth daily. 10/26/19  Yes Tat, Shanon Brow, MD  ferrous sulfate 325 (65 FE) MG tablet Take 1 tablet (325 mg total) by mouth daily with breakfast. 01/20/20  Yes Tat, Shanon Brow, MD  folic acid (FOLVITE) 1 MG tablet Take 1 tablet (1 mg total) by mouth daily. 11/13/19  Yes Annitta Needs, NP  furosemide (LASIX) 40 MG tablet Take 1 tablet (40 mg  total) by mouth daily. 01/20/20  Yes Tat, Shanon Brow, MD  lamoTRIgine (LAMICTAL) 200 MG tablet Take 200 mg by mouth 2 (two) times daily.  06/11/13  Yes [provider]  linaclotide (LINZESS) 72 MCG capsule Take 72 mcg by mouth daily as needed (for constipation).    Yes [provider]  Melatonin 5 MG CAPS Take 10 mg by mouth at bedtime as needed (sleep).   Yes [provider]  metoprolol succinate (TOPROL-XL) 50 MG 24 hr tablet Take 1 tablet (50 mg total) by mouth daily. Take with or immediately following a meal. 01/20/20  Yes Tat, Shanon Brow, MD  pantoprazole (PROTONIX) 40 MG tablet Take 40 mg by mouth 2 (two) times daily before a meal.   Yes [provider]  potassium chloride 20 MEQ/15ML (10%) SOLN Take 20 mEq by mouth daily.  02/12/20  Yes [provider]  thiamine 100 MG tablet Take 1 tablet (100 mg total) by mouth daily. 11/13/19  Yes Annitta Needs, NP  triamcinolone cream (KENALOG) 0.1 % Apply 1 application topically 2 (two) times daily as needed (rash).   Yes [provider]  vitamin B-12 (CYANOCOBALAMIN) 500 MCG tablet Take 1 tablet (500 mcg total) by mouth daily. 11/13/19  Yes Annitta Needs, NP  ALPRAZolam Duanne Moron) 0.5 MG tablet Take 1 tablet (0.5 mg total) by mouth 3 (three) times daily as needed for anxiety or sleep. Patient not taking: Reported on 04/21/2020 07/03/19   Roxan Hockey, MD  dronabinol (MARINOL) 2.5 MG capsule Take 1 capsule (2.5 mg total) by mouth 2 (two) times daily before lunch and supper. Patient not taking: Reported on 04/21/2020 11/13/19   Annitta Needs, NP  LORazepam (ATIVAN) 1 MG tablet Take 1 mg by mouth 2 (two) times daily as needed for anxiety.  04/16/20   [provider]    Allergies as of 04/16/2020 - Review Complete 02/15/2020  Allergen Reaction Noted  . Shellfish allergy Anaphylaxis 06/24/2019  . Lithium Nausea And Vomiting 06/27/2015  . Excedrin extra strength [aspirin-acetaminophen-caffeine] Other (See  Comments) 10/28/2014  . Klonopin [clonazepam] Other (See Comments) 10/23/2019    Family History  Problem Relation Age of Onset  . Colon cancer Paternal Grandfather 62  . Colon polyps Brother   . Colon polyps Cousin   . Diabetes Mother   . Hypertension Mother   . Hypertension Father   . Heart attack Father   . Heart failure Father     Social History   Socioeconomic History  . Marital status: Divorced    Spouse name: Not on file  . Number of children: Not on file  . Years of education: Not on file  . Highest education level: Not on file  Occupational History  . Occupation: Product manager: RETIRED    Comment: [place]  Tobacco Use  . Smoking status: Never Smoker  . Smokeless tobacco:  Never Used  . Tobacco comment: Never smoked  Vaping Use  . Vaping Use: Never used  Substance and Sexual Activity  . Alcohol use: No  . Drug use: No  . Sexual activity: Never  Other Topics Concern  . Not on file  Social History Narrative  . Not on file   Social Determinants of Health   Financial Resource Strain:   . Difficulty of Paying Living Expenses: Not on file  Food Insecurity:   . Worried About Charity fundraiser in the Last Year: Not on file  . Ran Out of Food in the Last Year: Not on file  Transportation Needs:   . Lack of Transportation (Medical): Not on file  . Lack of Transportation (Non-Medical): Not on file  Physical Activity:   . Days of Exercise per Week: Not on file  . Minutes of Exercise per Session: Not on file  Stress:   . Feeling of Stress : Not on file  Social Connections: Unknown  . Frequency of Communication with Friends and Family: Not on file  . Frequency of Social Gatherings with Friends and Family: Once a week  . Attends Religious Services: More than 4 times per year  . Active Member of Clubs or Organizations: No  . Attends Archivist Meetings: Never  . Marital Status: Divorced  Human resources officer Violence:   . Fear of Current or  Ex-Partner: Not on file  . Emotionally Abused: Not on file  . Physically Abused: Not on file  . Sexually Abused: Not on file    Review of Systems: See HPI, otherwise negative ROS  Physical Exam: BP (!) 161/80   Pulse 63   Temp 98.6 F (37 C) (Oral)   Resp 16   SpO2 99%  General:   Alert,  Well-developed, well-nourished, pleasant and cooperative in NAD Neck:  Supple; no masses or thyromegaly. No significant cervical adenopathy. Lungs:  Clear throughout to auscultation.   No wheezes, crackles, or rhonchi. No acute distress. Heart:  Regular rate and rhythm; no murmurs, clicks, rubs,  or gallops. Abdomen: Non-distended, normal bowel sounds.  Soft and nontender without appreciable mass or hepatosplenomegaly.  Pulses:  Normal pulses noted. Extremities:  Without clubbing or edema.  Impression/Plan: 64 year old lady history of rectal cancer status post low anterior resection in 2003.  Cured with surgery.  Doing well from a GI standpoint.  Here for surveillance colonoscopy today per plan.  The risks, benefits, limitations, alternatives and imponderables have been reviewed with the patient. Questions have been answered. All parties are agreeable.      Notice: This dictation was prepared with Dragon dictation along with smaller phrase technology. Any transcriptional errors that result from this process are unintentional and may not be corrected upon review.

## 2020-05-06 LAB — SURGICAL PATHOLOGY

## 2020-05-07 ENCOUNTER — Encounter (HOSPITAL_COMMUNITY): Payer: Self-pay | Admitting: Internal Medicine

## 2020-05-08 ENCOUNTER — Encounter: Payer: Self-pay | Admitting: Internal Medicine

## 2020-05-08 DIAGNOSIS — K219 Gastro-esophageal reflux disease without esophagitis: Secondary | ICD-10-CM | POA: Diagnosis not present

## 2020-05-08 DIAGNOSIS — E876 Hypokalemia: Secondary | ICD-10-CM | POA: Diagnosis not present

## 2020-05-08 DIAGNOSIS — E1129 Type 2 diabetes mellitus with other diabetic kidney complication: Secondary | ICD-10-CM | POA: Diagnosis not present

## 2020-05-08 DIAGNOSIS — Z79899 Other long term (current) drug therapy: Secondary | ICD-10-CM | POA: Diagnosis not present

## 2020-05-15 ENCOUNTER — Other Ambulatory Visit: Payer: Self-pay | Admitting: Gastroenterology

## 2020-05-15 NOTE — Telephone Encounter (Signed)
Spoke with pt. Pt was notified that she can d/c folate.

## 2020-05-15 NOTE — Telephone Encounter (Signed)
Patient can stop taking folate. Her last check 3 months ago was 23 which is much improved.

## 2020-05-16 DIAGNOSIS — N1831 Chronic kidney disease, stage 3a: Secondary | ICD-10-CM | POA: Diagnosis not present

## 2020-05-16 DIAGNOSIS — E785 Hyperlipidemia, unspecified: Secondary | ICD-10-CM | POA: Diagnosis not present

## 2020-05-16 DIAGNOSIS — I10 Essential (primary) hypertension: Secondary | ICD-10-CM | POA: Diagnosis not present

## 2020-05-16 DIAGNOSIS — R7309 Other abnormal glucose: Secondary | ICD-10-CM | POA: Diagnosis not present

## 2020-05-16 DIAGNOSIS — E1121 Type 2 diabetes mellitus with diabetic nephropathy: Secondary | ICD-10-CM | POA: Diagnosis not present

## 2020-06-18 DIAGNOSIS — R0689 Other abnormalities of breathing: Secondary | ICD-10-CM | POA: Diagnosis not present

## 2020-06-18 DIAGNOSIS — I499 Cardiac arrhythmia, unspecified: Secondary | ICD-10-CM | POA: Diagnosis not present

## 2020-06-18 DIAGNOSIS — Z743 Need for continuous supervision: Secondary | ICD-10-CM | POA: Diagnosis not present

## 2020-07-22 ENCOUNTER — Other Ambulatory Visit: Payer: Self-pay | Admitting: Gastroenterology

## 2020-08-04 ENCOUNTER — Other Ambulatory Visit: Payer: Self-pay

## 2020-08-04 ENCOUNTER — Ambulatory Visit (HOSPITAL_COMMUNITY)
Admission: RE | Admit: 2020-08-04 | Discharge: 2020-08-04 | Disposition: A | Discharge status: Home / Self Care | Payer: Medicare Other | Source: Ambulatory Visit | Attending: Internal Medicine | Admitting: Internal Medicine

## 2020-08-04 ENCOUNTER — Other Ambulatory Visit (HOSPITAL_COMMUNITY): Payer: Self-pay | Admitting: Internal Medicine

## 2020-08-04 DIAGNOSIS — M545 Low back pain, unspecified: Secondary | ICD-10-CM

## 2020-08-04 DIAGNOSIS — M546 Pain in thoracic spine: Secondary | ICD-10-CM | POA: Insufficient documentation

## 2020-08-04 DIAGNOSIS — M5134 Other intervertebral disc degeneration, thoracic region: Secondary | ICD-10-CM | POA: Diagnosis not present

## 2020-08-04 DIAGNOSIS — K219 Gastro-esophageal reflux disease without esophagitis: Secondary | ICD-10-CM | POA: Diagnosis not present

## 2020-08-11 ENCOUNTER — Other Ambulatory Visit: Payer: Self-pay | Admitting: Gastroenterology

## 2020-08-11 ENCOUNTER — Telehealth: Payer: Self-pay | Admitting: Internal Medicine

## 2020-08-11 DIAGNOSIS — R112 Nausea with vomiting, unspecified: Secondary | ICD-10-CM | POA: Diagnosis not present

## 2020-08-11 DIAGNOSIS — R1011 Right upper quadrant pain: Secondary | ICD-10-CM

## 2020-08-11 DIAGNOSIS — K219 Gastro-esophageal reflux disease without esophagitis: Secondary | ICD-10-CM

## 2020-08-11 DIAGNOSIS — R7989 Other specified abnormal findings of blood chemistry: Secondary | ICD-10-CM | POA: Diagnosis not present

## 2020-08-11 MED ORDER — LANSOPRAZOLE 30 MG PO CPDR: 30.0000 mg | DELAYED_RELEASE_CAPSULE | Freq: Two times a day (BID) | ORAL | 3 refills | Status: DC

## 2020-08-11 NOTE — Telephone Encounter (Signed)
Pt needs to speak with nurse. [phone]

## 2020-08-11 NOTE — Telephone Encounter (Signed)
Spoke with pt. Pt states that she has been in a lot of pain that she feels is her reflux. Pt's pain is starting under her rib cage and wraps around her back with burning sensations in her chest, burping and vomiting. Pt is taking Pantoprazole 40 mg bid and taking Pepcid at night. Pt states her reflux is currently bad. Pt missed 1-2 days of her Pantoprazole and has been back on it for a while. Pt is hurting in other areas of her body and pts daughter want her to call her neurologist. Pt feels it's completely her Reflux causing pain and keeping her from sleeping.

## 2020-08-11 NOTE — Telephone Encounter (Signed)
Of note, patient had also reported eating fried fish frequently and drinking lime water. We discussed the importance of following a strict GERD.

## 2020-08-11 NOTE — Telephone Encounter (Signed)
Spoke with patient. Symptoms have been present since August 2021 but have been worsening.  Right sided pain is worsened by meals.  Reflux symptoms are also worsened by meals.  Associated nausea.  Single episode of vomiting yesterday which is the first time this occurred.  No nausea or vomiting thus far today. No NSAID products.  No BRBPR or melena.  She does take iron for her stools are dark.  No urinary symptoms. States she has been off Marinol for several months now. She stopped this herself as she states she was "eating like a pig".  She has gained quite a bit of weight and is around 135-138 pounds (per patient).  History of cholecystectomy.  No history of pancreatitis.  Per chart review, it appears patient has been on Nexium, Protonix, Dexilant, and AcipHex thus far.  Plan:  We will stop Protonix and try Prevacid 30 mg twice daily.  Prescription was sent to her pharmacy. 2.   We will also update labs including CBC, CMP, and lipase which will be completed at [facility].  I have placed orders.  Patient is aware to have labs completed today or tomorrow.  3.   She has an appointment in January for follow-up.  I would like to try to get this moved up. 4.  She was advised to proceed to the emergency room if she has any worsening symptoms.  Requested progress report in 1-2 weeks.  Stacey: Can we try to move patient's follow-up appointment up?

## 2020-08-12 LAB — CBC WITH DIFFERENTIAL/PLATELET
Basophils Absolute: 0 10*3/uL (ref 0.0–0.2)
Basos: 1 %
EOS (ABSOLUTE): 0 10*3/uL (ref 0.0–0.4)
Eos: 1 %
Hematocrit: 42.5 % (ref 34.0–46.6)
Hemoglobin: 13.9 g/dL (ref 11.1–15.9)
Immature Grans (Abs): 0 10*3/uL (ref 0.0–0.1)
Immature Granulocytes: 0 %
Lymphocytes Absolute: 1.8 10*3/uL (ref 0.7–3.1)
Lymphs: 35 %
MCH: 27.8 pg (ref 26.6–33.0)
MCHC: 32.7 g/dL (ref 31.5–35.7)
MCV: 85 fL (ref 79–97)
Monocytes Absolute: 0.5 10*3/uL (ref 0.1–0.9)
Monocytes: 10 %
Neutrophils Absolute: 2.7 10*3/uL (ref 1.4–7.0)
Neutrophils: 53 %
Platelets: 326 10*3/uL (ref 150–450)
RBC: 5 x10E6/uL (ref 3.77–5.28)
RDW: 13.1 % (ref 11.7–15.4)
WBC: 5 10*3/uL (ref 3.4–10.8)

## 2020-08-12 LAB — COMPLETE METABOLIC PANEL WITH GFR
AG Ratio: 1.5 (calc) (ref 1.0–2.5)
ALT: 16 U/L (ref 6–29)
AST: 14 U/L (ref 10–35)
Albumin: 4.2 g/dL (ref 3.6–5.1)
Alkaline phosphatase (APISO): 108 U/L (ref 37–153)
BUN/Creatinine Ratio: 14 (calc) (ref 6–22)
BUN: 17 mg/dL (ref 7–25)
CO2: 31 mmol/L (ref 20–32)
Calcium: 10.3 mg/dL (ref 8.6–10.4)
Chloride: 103 mmol/L (ref 98–110)
Creat: 1.21 mg/dL — ABNORMAL HIGH (ref 0.50–0.99)
GFR, Est African American: 55 mL/min/{1.73_m2} — ABNORMAL LOW (ref >=60)
GFR, Est Non African American: 48 mL/min/{1.73_m2} — ABNORMAL LOW (ref >=60)
Globulin: 2.8 g/dL (calc) (ref 1.9–3.7)
Glucose, Bld: 82 mg/dL (ref 65–99)
Potassium: 3.5 mmol/L (ref 3.5–5.3)
Sodium: 141 mmol/L (ref 135–146)
Total Bilirubin: 0.4 mg/dL (ref 0.2–1.2)
Total Protein: 7 g/dL (ref 6.1–8.1)

## 2020-08-12 LAB — COMPREHENSIVE METABOLIC PANEL
ALT: 16 IU/L (ref 0–32)
AST: 15 IU/L (ref 0–40)
Albumin/Globulin Ratio: 1.7 (ref 1.2–2.2)
Albumin: 4.7 g/dL (ref 3.8–4.8)
Alkaline Phosphatase: 133 IU/L — ABNORMAL HIGH (ref 44–121)
BUN/Creatinine Ratio: 12 (ref 12–28)
BUN: 15 mg/dL (ref 8–27)
Bilirubin Total: <0.2 mg/dL (ref 0.0–1.2)
CO2: 26 mmol/L (ref 20–29)
Calcium: 10.3 mg/dL (ref 8.7–10.3)
Chloride: 101 mmol/L (ref 96–106)
Creatinine, Ser: 1.21 mg/dL — ABNORMAL HIGH (ref 0.57–1.00)
GFR calc Af Amer: 55 mL/min/{1.73_m2} — ABNORMAL LOW (ref >59)
GFR calc non Af Amer: 48 mL/min/{1.73_m2} — ABNORMAL LOW (ref >59)
Globulin, Total: 2.7 g/dL (ref 1.5–4.5)
Glucose: 72 mg/dL (ref 65–99)
Potassium: 3.5 mmol/L (ref 3.5–5.2)
Sodium: 142 mmol/L (ref 134–144)
Total Protein: 7.4 g/dL (ref 6.0–8.5)

## 2020-08-12 LAB — LIPASE: Lipase: 36 U/L (ref 14–72)

## 2020-08-12 NOTE — Telephone Encounter (Signed)
Noted  

## 2020-08-12 NOTE — Telephone Encounter (Signed)
We don't have anything sooner, I added her to a cancel list

## 2020-08-13 ENCOUNTER — Ambulatory Visit (INDEPENDENT_AMBULATORY_CARE_PROVIDER_SITE_OTHER): Payer: Medicare Other | Admitting: "Endocrinology

## 2020-08-13 ENCOUNTER — Encounter: Payer: Self-pay | Admitting: "Endocrinology

## 2020-08-13 ENCOUNTER — Other Ambulatory Visit: Payer: Self-pay

## 2020-08-13 VITALS — BP 132/76 | HR 58 | Ht 60.0 in | Wt 137.0 lb

## 2020-08-13 DIAGNOSIS — R636 Underweight: Secondary | ICD-10-CM

## 2020-08-13 DIAGNOSIS — E270 Other adrenocortical overactivity: Secondary | ICD-10-CM

## 2020-08-13 NOTE — Progress Notes (Signed)
08/13/2020, 12:48 PM  Endocrinology follow-up note   Subjective:    Patient ID: Linda English, female    DOB: [birth year], PCP [name], MD   Past Medical History:  Diagnosis Date  . Acid reflux   . Allergic rhinitis   . Anxiety   . Arthritis    osteoarthritis  . Bipolar disorder ([place])   . Chronic headache   . Depression   . Diverticula of colon   . DM (diabetes mellitus) ([place])   . Gastric erosions   . Gastric polyps    benign   . Hemorrhoids   . Hiatal hernia   . Hypertension   . IBS (irritable bowel syndrome)   . Lichen planus   . Obstructive sleep apnea   . Panic disorder   . Parkinson's disease ([place])   . Rectal cancer ([place]) 2003   ileostomy and reversal  . Subdural hematoma ([place])   . Tubular adenoma    Past Surgical History:  Procedure Laterality Date  . ABDOMINAL HYSTERECTOMY    . APPENDECTOMY    . BACK SURGERY    . BIOPSY  10/01/2019   Procedure: BIOPSY;  Surgeon: Daneil Dolin, MD;  [facility];  Service: Endoscopy;;  gastric  . CESAREAN SECTION     X  2  . CHOLECYSTECTOMY    . COLONOSCOPY  08/2007   DR Gala Romney, friable anal canal hemorrhoids, surgical anastomosis at 3cm, distal scattered tics  . COLONOSCOPY  12/15/2010   anal papilla and internal hemorrhoids/diminutive polyp in the base of the cecum. Past, tubular adenoma.. Next colonoscopy due in April 2017.  Marland Kitchen COLONOSCOPY  10/27/2005   RMR: Anal canal hemorrhoids. Surgical anastomosis at 3 cm from the anal verge appeared normal. Few scattered distal diverticula. The residual  colonic mucosa appeared normal. I suspect the patient bled from hemorrhoids  . COLONOSCOPY N/A 01/15/2015   FWY:OVZCHY residual rectum and colon. next tcs 12/2019  . COLONOSCOPY WITH PROPOFOL N/A 05/01/2020   Procedure: COLONOSCOPY WITH PROPOFOL;  Surgeon: Daneil Dolin, MD;  [facility];  Service: Endoscopy;  Laterality: N/A;  2:45pm, office LM for  pt to call about moving up  . ESOPHAGEAL DILATION N/A 06/27/2015   Procedure: ESOPHAGEAL DILATION;  Surgeon: Daneil Dolin, MD;  [facility];  Service: Endoscopy;  Laterality: N/A;  . ESOPHAGOGASTRODUODENOSCOPY  05/2002   DR ROURK, normal  . ESOPHAGOGASTRODUODENOSCOPY  08/03/2011   RMR: small HH/ gastric polyps  . ESOPHAGOGASTRODUODENOSCOPY N/A 06/27/2015   Dr. Gala Romney: Mild erosive reflux esophagitits. Status post passage of a Maloney dilator. Hiatal hernia. Gastric polyps and abnormal gastric mucosa of doubtful clinical significane. status post biopsy, benign fundic gland polyp, negative H.pylori  . ESOPHAGOGASTRODUODENOSCOPY (EGD) WITH PROPOFOL N/A 10/01/2019   rourk: Normal esophagus status post esophageal dilation for dysphagia, erythematous mucosa in the stomach biopsy consistent with reactive gastropathy, no H. pylori, multiple gastric polyps (biopsy fundic gland)  . low anterior rection  2003   RECTAL CANCER  . MALONEY DILATION N/A 10/01/2019   Procedure: Venia Minks DILATION;  Surgeon: Daneil Dolin, MD;  [facility];  Service: Endoscopy;  Laterality: N/A;  . POLYPECTOMY  05/01/2020   Procedure: POLYPECTOMY;  Surgeon: Gala Romney,  Cristopher Estimable, MD;  [facility];  Service: Endoscopy;;  . SPINE SURGERY  2001   L5,S1 HEMILAMINOTOMY AND DISCECTOMY/DR DEATON   Social History   Socioeconomic History  . Marital status: Divorced    Spouse name: Not on file  . Number of children: Not on file  . Years of education: Not on file  . Highest education level: Not on file  Occupational History  . Occupation: Product manager: RETIRED    Comment: [employer]  Tobacco Use  . Smoking status: Never Smoker  . Smokeless tobacco: Never Used  . Tobacco comment: Never smoked  Vaping Use  . Vaping Use: Never used  Substance and Sexual Activity  . Alcohol use: No  . Drug use: No  . Sexual activity: Never  Other Topics Concern  . Not on file  Social History Narrative  .  Not on file   Social Determinants of Health   Financial Resource Strain: Not on file  Food Insecurity: Not on file  Transportation Needs: Not on file  Physical Activity: Not on file  Stress: Not on file  Social Connections: Not on file   Family History  Problem Relation Age of Onset  . Colon cancer Paternal Grandfather 25  . Colon polyps Brother   . Colon polyps Cousin   . Diabetes Mother   . Hypertension Mother   . Hypertension Father   . Heart attack Father   . Heart failure Father    Outpatient Encounter Medications as of 08/13/2020  Medication Sig  . acetaminophen (TYLENOL) 500 MG tablet Take 1,000 mg by mouth every 8 (eight) hours as needed for moderate pain or headache.  Marland Kitchen aspirin 81 MG chewable tablet Chew 1 tablet (81 mg total) by mouth daily.  . clonazePAM (KLONOPIN) 1 MG tablet Take 1 mg by mouth 2 (two) times daily as needed.  . diltiazem (CARDIZEM CD) 120 MG 24 hr capsule Take 1 capsule (120 mg total) by mouth daily.  . ferrous sulfate 325 (65 FE) MG tablet Take 1 tablet (325 mg total) by mouth daily with breakfast.  . furosemide (LASIX) 40 MG tablet Take 1 tablet (40 mg total) by mouth daily.  Marland Kitchen lamoTRIgine (LAMICTAL) 200 MG tablet Take 200 mg by mouth 2 (two) times daily.   . lansoprazole (PREVACID) 30 MG capsule Take 1 capsule (30 mg total) by mouth 2 (two) times daily before a meal.  . linaclotide (LINZESS) 72 MCG capsule Take 72 mcg by mouth daily as needed (for constipation).   . LORazepam (ATIVAN) 1 MG tablet Take 1 mg by mouth 2 (two) times daily as needed for anxiety.   . Melatonin 5 MG CAPS Take 10 mg by mouth at bedtime as needed (sleep).  . metoprolol succinate (TOPROL-XL) 50 MG 24 hr tablet Take 1 tablet (50 mg total) by mouth daily. Take with or immediately following a meal.  . potassium chloride 20 MEQ/15ML (10%) SOLN Take 20 mEq by mouth daily.   Marland Kitchen thiamine 100 MG tablet Take 1 tablet (100 mg total) by mouth daily.  Marland Kitchen triamcinolone cream (KENALOG) 0.1  % Apply 1 application topically 2 (two) times daily as needed (rash).  . vitamin B-12 (CYANOCOBALAMIN) 500 MCG tablet Take 1 tablet (500 mcg total) by mouth daily.  . [DISCONTINUED] folic acid (FOLVITE) 1 MG tablet Take 1 tablet (1 mg total) by mouth daily.   No facility-administered encounter medications on file as of 08/13/2020.   ALLERGIES: Allergies  Allergen Reactions  .  Lithium Nausea And Vomiting  . Excedrin Extra Strength [Aspirin-Acetaminophen-Caffeine] Other (See Comments)    Makes her nervous.  Lindajo Royal [Ziprasidone Hcl]     Neurologist discontinued d/t diagnosis  . Klonopin [Clonazepam] Other (See Comments)    hallucinations    VACCINATION STATUS: Immunization History  Administered Date(s) Administered  . Influenza,inj,Quad PF,6+ Mos 06/25/2019  . Pneumococcal Polysaccharide-23 08/13/2019    HPI Delanee AARALYNN SHEPHEARD is 64 y.o. female who presents today with a medical history as above. -She was seen in the clinic in April 2021 in consult for hypercortisolism.  Her subsequent 24-hour urine free cortisol was normal.  She has had multiple medical problems as above.  She presents with no new complaints today.  She feels stronger, has normal appetite, regained most of her weight lost.  She did have recent hospitalization for metabolic encephalopathy.  Review did not reveal exposure to steroids.    Her 24-hour urine free cortisol was normal at 12.7. - She denies family history of adrenal, thyroid dysfunction. She has history of diabetes, although well-controlled with A1c of 5.8%.  She was taken off of most of her diabetes medications recently, including insulin treatment. CT abdomen done indue to nausea/vomiting -adrenals were unremarkable. -She had mostly been underweight, in October 2020 she weighed 118 pounds.  In the interim her weight fluctuated so much that at one point she weighed less than 100 pounds.  Today in the clinic she weighed 113 pounds, gaining 9 pounds since  last visit.  He denies nausea/vomiting. -She was never diagnosed with adrenal dysfunction.  She has chronic problem with equilibrium which made her wheelchair-bound.   Review of Systems  Constitutional: + Appropriate weight gain,  reported being underweight most of her adult life,   + fatigue, no subjective hyperthermia, no subjective hypothermia   Objective:    Vitals with BMI 08/13/2020 05/01/2020 05/01/2020  Height 5\' 0"  - -  Weight 137 lbs - -  BMI 84.13 - -  Systolic 244 010 272  Diastolic 76 70 80  Pulse 58 - 63  Some encounter information is confidential and restricted. Go to Review Flowsheets activity to see all data.    BP 132/76   Pulse (!) 58   Ht 5' (1.524 m)   Wt 137 lb (62.1 kg)   BMI 26.76 kg/m   Wt Readings from Last 3 Encounters:  08/13/20 137 lb (62.1 kg)  02/15/20 111 lb 12.8 oz (50.7 kg)  02/12/20 113 lb 6.4 oz (51.4 kg)    Physical Exam  Constitutional:  Body mass index is 26.76 kg/m.,     CMP ( most recent) CMP     Component Value Date/Time   NA 141 08/11/2020 1400   NA 142 08/11/2020 1315   K 3.5 08/11/2020 1400   K 4.6 06/26/2010 1400   CL 103 08/11/2020 1400   CO2 31 08/11/2020 1400   GLUCOSE 82 08/11/2020 1400   GLUCOSE 72 08/11/2020 1315   BUN 17 08/11/2020 1400   BUN 15 08/11/2020 1315   CREATININE 1.21 (H) 08/11/2020 1400   CREATININE 1.21 (H) 08/11/2020 1315   CALCIUM 10.3 08/11/2020 1400   PROT 7.0 08/11/2020 1400   PROT 7.4 08/11/2020 1315   ALBUMIN 4.7 08/11/2020 1315   AST 14 08/11/2020 1400   AST 17 06/26/2010 1400   ALT 16 08/11/2020 1400   ALKPHOS 133 (H) 08/11/2020 1315   ALKPHOS 92 06/26/2010 1400   BILITOT 0.4 08/11/2020 1400   BILITOT <0.2 08/11/2020 1315  BILITOT 0.3 06/26/2010 1400   GFRNONAA 48 (L) 08/11/2020 1400   GFRNONAA 48 (L) 08/11/2020 1315   GFRAA 55 (L) 08/11/2020 1400   GFRAA 55 (L) 08/11/2020 1315    Diabetic Labs (most recent): Lab Results  Component Value Date   HGBA1C 5.0 01/17/2020    HGBA1C 5.8 (H) 10/23/2019   HGBA1C 5.7 (H) 08/12/2019     Lipid Panel ( most recent) Lipid Panel     Component Value Date/Time   CHOL 159 08/10/2015 0621   TRIG 188 (H) 08/10/2015 0621   HDL 53 08/10/2015 0621   CHOLHDL 3.0 08/10/2015 0621   VLDL 38 08/10/2015 0621   LDLCALC 68 08/10/2015 0621      Lab Results  Component Value Date   TSH 2.230 01/18/2020   TSH 2.048 10/26/2019   TSH 2.213 10/03/2013   TSH 1.312 04/27/2012   FREET4 1.03 01/18/2020   FREET4 1.41 (H) 10/26/2019   FREET4 1.20 04/27/2012    CBC    Component Value Date/Time   WBC 5.0 08/11/2020 1315   WBC 4.6 01/18/2020 0504   RBC 5.00 08/11/2020 1315   RBC 3.50 (L) 01/18/2020 0504   HGB 13.9 08/11/2020 1315   HCT 42.5 08/11/2020 1315   PLT 326 08/11/2020 1315   MCV 85 08/11/2020 1315   MCH 27.8 08/11/2020 1315   MCH 27.7 01/18/2020 0504   MCHC 32.7 08/11/2020 1315   MCHC 31.0 01/18/2020 0504   RDW 13.1 08/11/2020 1315   LYMPHSABS 1.8 08/11/2020 1315   MONOABS 0.4 01/17/2020 0126   EOSABS 0.0 08/11/2020 1315   BASOSABS 0.0 08/11/2020 1315     24-hour urine free cortisol was normal at 12.7. Assessment & Plan:   1. Hypercortisolemia-resolved -Her work-up so far did not confirm endogenous hypercortisolism, will not need any further work-up.   CT scan of the abdomen done in October 2020 showed normal adrenals.  2.  Underweight/undernourished -She is recovered all of her weight lost previously. -Patient with multiple medical problems, detailed above.   -She has achieved a good weight with BMI of 26, advised to maintain well-balanced meals 3 times a day. -She will not need treatment for diabetes at this time. -Her recent thyroid function tests were within normal limits. - I did not initiate any new prescriptions today.  She will return to clinic as needed.  - she is advised to maintain close follow up with [name], MD for primary care needs.     - Time spent on this patient care encounter:  20  minutes of which 50% was spent in  counseling and the rest reviewing  her current and  previous labs / studies and medications  doses and developing a plan for long term care. Britney H Donatelli  participated in the discussions, expressed understanding, and voiced agreement with the above plans.  All questions were answered to her satisfaction. she is encouraged to contact clinic should she have any questions or concerns prior to her return visit.    Follow up plan: Return if symptoms worsen or fail to improve.   Glade Lloyd, MD [facility] [address] [place], [place] [address] Phone: [phone]  Fax: [phone]     08/13/2020, 12:48 PM  This note was partially dictated with voice recognition software. Similar sounding words can be transcribed inadequately or may not  be corrected upon review.

## 2020-08-14 DIAGNOSIS — M5414 Radiculopathy, thoracic region: Secondary | ICD-10-CM | POA: Diagnosis not present

## 2020-08-19 DIAGNOSIS — K3184 Gastroparesis: Secondary | ICD-10-CM | POA: Diagnosis not present

## 2020-08-19 DIAGNOSIS — B0229 Other postherpetic nervous system involvement: Secondary | ICD-10-CM | POA: Diagnosis not present

## 2020-08-19 DIAGNOSIS — E1143 Type 2 diabetes mellitus with diabetic autonomic (poly)neuropathy: Secondary | ICD-10-CM | POA: Diagnosis not present

## 2020-08-28 DIAGNOSIS — Z79899 Other long term (current) drug therapy: Secondary | ICD-10-CM | POA: Diagnosis not present

## 2020-08-28 DIAGNOSIS — E1129 Type 2 diabetes mellitus with other diabetic kidney complication: Secondary | ICD-10-CM | POA: Diagnosis not present

## 2020-08-28 DIAGNOSIS — N183 Chronic kidney disease, stage 3 unspecified: Secondary | ICD-10-CM | POA: Diagnosis not present

## 2020-08-28 DIAGNOSIS — I10 Essential (primary) hypertension: Secondary | ICD-10-CM | POA: Diagnosis not present

## 2020-08-28 DIAGNOSIS — E785 Hyperlipidemia, unspecified: Secondary | ICD-10-CM | POA: Diagnosis not present

## 2020-09-10 NOTE — Progress Notes (Signed)
Referring Provider: [name], MD Primary Care Physician:  [name], MD Primary GI Physician: Dr. Gala Romney  Chief Complaint  Patient presents with  . Gastroesophageal Reflux    Chest hurts around to back under rib cage    HPI:   Linda English is a 65 y.o. female presenting today for follow-up of GERD, nausea/vomiting, weight loss, abdominal pain,  history of rectal cancer s/p colonoscopy.   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 She underwent at least 7 sessions of biofeedback retraining.Historically difficult to manage bowel regimen as medications will work for some time and then result in diarrhea. Also with history of GERD, dysphagia, and persistent nausea with vomiting and weight loss. Multiple imaging studies including CT head, MRI brain, CT A/P without acute findings.  Fasting cortisol level elevated at 40 while inpatient in February 2021 and was referred to endocrinology. EGD 10/01/2019: normal esophagus status post esophageal dilation for dysphagia, erythematous mucosa in the stomach consistent with reactive gastropathy on biopsy, no H. pylori, multiple fundic gland polyps based on biopsy. GES March 2201 normal. She was started on Marinol in March 2021 with clinical improvement in N/V. Had been referred to [facility], but when she saw them, she was doing well with Marinol and they advised to follow-up as needed.   Last seen in our office 02/15/2020.  N/V had resolved.  She was gaining weight.  Reported trouble swallowing large pills but no trouble with foods or liquids.  Discussed BPE, but patient preferred to monitor.  Heartburn symptoms a few times a week.  Discussed changing PPI but patient preferred to continue Protonix.  Bowels had regulated without need for Linzess. Occasional toilet tissue  hematochezia in the setting of known hemorrhoids.  Plan to continue her current medications, counseled on GERD diet/lifestyle, use Linzess 72 mcg as needed, and proceed with colonoscopy.  Colonoscopy 05/01/2020: Two 3-8 millimeters polyps in ascending colon, diverticulosis in descending colon, surgical anastomosis at 4 cm, otherwise normal exam.  Pathology with tubular adenomas.  Recommended repeat colonoscopy in 5 years.  Telephone call 08/11/2020 reporting right-sided abdominal pain, wrapping around her back with burning sensation in her chest, uncontrolled GERD, associated nausea with single episode of vomiting.  Patient felt symptoms were secondary to uncontrolled GERD.  She was eating fried fish frequently and drinking lime water. Stated symptoms have been present since August but were worsening.  No BRBPR or melena.  Stools were dark on iron.  No NSAID products.  She had stopped Marinol for several months due to "eating like a pig".  Reported gaining quite a bit of weight.  Plan to stop Protonix and try Prevacid 30 mg twice daily, counseled on GERD diet/lifestyle, update labs, and advised to proceed to the emergency room if any worsening symptoms.  Requested 1-2-week progress report.  Labs completed with CBC within normal limits, CMP with slight bump in creatinine at 1.21 and slight elevation of alk phos at 133 (improved compared to 6 months prior), lipase within normal limits.   Progress report received 08/14/2020.  She reported pain in her back that goes to her rib cage.  She felt pain was related to her back and planed to see PCP that day.  Also reported vomiting a couple times but felt it was related to what she ate.  Stated she would call us back  after seeing PCP.  We have not received a return call.  Today:   Since September-October, she has been having right sided pain radiating from her right shoulder blade to RUQ. Worsened if taking a deep breath, certain positions/getting up, and sometimes  worsened by meals. No specific food triggers. Nausea when the pain is more severe. No vomiting recently. Had a couple episodes of vomiting before she resumed Marinol in December. In general, Marinol as resolved the general nausea/vomiting she had been having. Current nausea seems to be more associated with the right sided pain.  Saw her PCP and neurologist.  It may be secondary to neuropathy.  She was started on tramadol and lidocaine patch which has helped somewhat, but she continues with intermittent symptoms.  She is taking Prevacid twice daily and famotidine 20 mg every evening which has improved her GERD symptoms.  Continues to have breakthrough symptoms about 4 times a week, more so in the evening.  Worse if eating a larger meal.  Intermittent chest discomfort if having heartburn. States she tends to snack throughout the day and eat a meal in the afternoon.  Occasional eating within 3 hours of going to bed.  Trying to limit fried foods.  Later states she eats frozen fish sticks but she bakes them.  No soda.  No coffee.  No spicy foods.  No dysphagia.  Has had constipation for the last couple of weeks since starting tramadol. Not taking Linzess daily.  She has taken Linzess twice in the last couple of weeks and has had 3 bowel movements in the last couple of weeks.  No BRBPR or melena.  No NSAIDs. No alcohol.   Past Medical History:  Diagnosis Date  . Acid reflux   . Allergic rhinitis   . Anxiety   . Arthritis    osteoarthritis  . Bipolar disorder ([employer])   . Chronic headache   . Depression   . Diverticula of colon   . DM (diabetes mellitus) ([place])   . Gastric erosions   . Gastric polyps    benign   . Hemorrhoids   . Hiatal hernia   . Hypertension   . IBS (irritable bowel syndrome)   . Lichen planus   . Obstructive sleep apnea   . Panic disorder   . Parkinson's disease ([place])   . Rectal cancer ([place]) 2003   ileostomy and reversal  . Subdural hematoma ([place])   . Tubular adenoma      Past Surgical History:  Procedure Laterality Date  . ABDOMINAL HYSTERECTOMY    . APPENDECTOMY    . BACK SURGERY    . BIOPSY  10/01/2019   Procedure: BIOPSY;  Surgeon: Daneil Dolin, MD;  [facility];  Service: Endoscopy;;  gastric  . CESAREAN SECTION     X  2  . CHOLECYSTECTOMY    . COLONOSCOPY  08/2007   DR Gala Romney, friable anal canal hemorrhoids, surgical anastomosis at 3cm, distal scattered tics  . COLONOSCOPY  12/15/2010   anal papilla and internal hemorrhoids/diminutive polyp in the base of the cecum. Past, tubular adenoma.. Next colonoscopy due in April 2017.  Marland Kitchen COLONOSCOPY  10/27/2005   RMR: Anal canal hemorrhoids. Surgical anastomosis at 3 cm from the anal verge appeared normal. Few scattered distal diverticula. The residual  colonic mucosa appeared normal. I suspect the patient bled from hemorrhoids  . COLONOSCOPY N/A 01/15/2015   VUY:EBXIDH residual rectum and colon. next tcs 12/2019  . COLONOSCOPY WITH PROPOFOL N/A 05/01/2020  Procedure: COLONOSCOPY WITH PROPOFOL;  Surgeon: Daneil Dolin, MD;  Two 3-8 millimeters polyps in ascending colon, diverticulosis in descending colon, surgical anastomosis at 4 cm, otherwise normal exam.  Pathology with tubular adenomas.  Repeat colonoscopy in 5 years.  . ESOPHAGEAL DILATION N/A 06/27/2015   Procedure: ESOPHAGEAL DILATION;  Surgeon: Daneil Dolin, MD;  [facility];  Service: Endoscopy;  Laterality: N/A;  . ESOPHAGOGASTRODUODENOSCOPY  05/2002   DR ROURK, normal  . ESOPHAGOGASTRODUODENOSCOPY  08/03/2011   RMR: small HH/ gastric polyps  . ESOPHAGOGASTRODUODENOSCOPY N/A 06/27/2015   Dr. Gala Romney: Mild erosive reflux esophagitits. Status post passage of a Maloney dilator. Hiatal hernia. Gastric polyps and abnormal gastric mucosa of doubtful clinical significane. status post biopsy, benign fundic gland polyp, negative H.pylori  . ESOPHAGOGASTRODUODENOSCOPY (EGD) WITH PROPOFOL N/A 10/01/2019   rourk: Normal esophagus status  post esophageal dilation for dysphagia, erythematous mucosa in the stomach biopsy consistent with reactive gastropathy, no H. pylori, multiple gastric polyps (biopsy fundic gland)  . low anterior rection  2003   RECTAL CANCER  . MALONEY DILATION N/A 10/01/2019   Procedure: Venia Minks DILATION;  Surgeon: Daneil Dolin, MD;  [facility];  Service: Endoscopy;  Laterality: N/A;  . POLYPECTOMY  05/01/2020   Procedure: POLYPECTOMY;  Surgeon: Daneil Dolin, MD;  [facility];  Service: Endoscopy;;  . SPINE SURGERY  2001   L5,S1 HEMILAMINOTOMY AND DISCECTOMY/DR DEATON    Current Outpatient Medications  Medication Sig Dispense Refill  . acetaminophen (TYLENOL) 500 MG tablet Take 1,000 mg by mouth every 8 (eight) hours as needed for moderate pain or headache.    Marland Kitchen aspirin 81 MG chewable tablet Chew 1 tablet (81 mg total) by mouth daily.    . clonazePAM (KLONOPIN) 1 MG tablet Take 1 mg by mouth 2 (two) times daily as needed.    . diltiazem (TIAZAC) 240 MG 24 hr capsule Take 1 capsule by mouth daily at 12 noon.    . dronabinol (MARINOL) 2.5 MG capsule Take 2.5 mg by mouth 2 (two) times daily before a meal.    . famotidine (PEPCID) 40 MG tablet Take 1 tablet (40 mg total) by mouth at bedtime. 30 tablet 5  . ferrous sulfate 325 (65 FE) MG tablet Take 1 tablet (325 mg total) by mouth daily with breakfast.  3  . furosemide (LASIX) 40 MG tablet Take 1 tablet (40 mg total) by mouth daily. 30 tablet 1  . lamoTRIgine (LAMICTAL) 200 MG tablet Take 200 mg by mouth 2 (two) times daily.    . lansoprazole (PREVACID) 30 MG capsule Take 1 capsule (30 mg total) by mouth 2 (two) times daily before a meal. 60 capsule 3  . lidocaine (LIDODERM) 5 % Place 2 patches onto the skin 2 (two) times daily.    Marland Kitchen linaclotide (LINZESS) 72 MCG capsule Take 72 mcg by mouth daily as needed (for constipation).     Marland Kitchen losartan (COZAAR) 50 MG tablet Take 1 tablet by mouth at bedtime.    . Melatonin 5 MG CAPS Take 10 mg  by mouth at bedtime as needed (sleep).    . metoprolol succinate (TOPROL-XL) 50 MG 24 hr tablet Take 1 tablet (50 mg total) by mouth daily. Take with or immediately following a meal. 30 tablet 1  . OXcarbazepine (TRILEPTAL) 150 MG tablet Take 1-2 tablets by mouth in the morning and at bedtime.    . potassium chloride 20 MEQ/15ML (10%) SOLN Take 20 mEq by mouth  daily.     . traMADol (ULTRAM) 50 MG tablet Take 50 mg by mouth every 12 (twelve) hours as needed.    . triamcinolone cream (KENALOG) 0.1 % Apply 1 application topically 2 (two) times daily as needed (rash).    . vitamin B-12 (CYANOCOBALAMIN) 500 MCG tablet Take 1 tablet (500 mcg total) by mouth daily. 30 tablet 5  . LORazepam (ATIVAN) 1 MG tablet Take 1 mg by mouth 2 (two) times daily as needed for anxiety.  (Patient not taking: Reported on 09/11/2020)    . thiamine 100 MG tablet Take 1 tablet (100 mg total) by mouth daily. (Patient not taking: Reported on 09/11/2020) 30 tablet 5   No current facility-administered medications for this visit.    Allergies as of 09/11/2020 - Review Complete 09/11/2020  Allergen Reaction Noted  . Lithium Nausea And Vomiting 06/27/2015  . Excedrin extra strength [aspirin-acetaminophen-caffeine] Other (See Comments) 10/28/2014  . Geodon [ziprasidone hcl]  05/01/2020  . Klonopin [clonazepam] Other (See Comments) 10/23/2019    Family History  Problem Relation Age of Onset  . Colon cancer Paternal Grandfather 73  . Colon polyps Brother   . Colon polyps Cousin   . Diabetes Mother   . Hypertension Mother   . Hypertension Father   . Heart attack Father   . Heart failure Father     Social History   Socioeconomic History  . Marital status: Divorced    Spouse name: Not on file  . Number of children: Not on file  . Years of education: Not on file  . Highest education level: Not on file  Occupational History  . Occupation: Product manager: RETIRED    Comment: [employer]  Tobacco Use  . Smoking  status: Never Smoker  . Smokeless tobacco: Never Used  . Tobacco comment: Never smoked  Vaping Use  . Vaping Use: Never used  Substance and Sexual Activity  . Alcohol use: No  . Drug use: No  . Sexual activity: Never  Other Topics Concern  . Not on file  Social History Narrative  . Not on file   Social Determinants of Health   Financial Resource Strain: Not on file  Food Insecurity: Not on file  Transportation Needs: Not on file  Physical Activity: Not on file  Stress: Not on file  Social Connections: Not on file    Review of Systems: Gen: Denies fever, chills, cold or flulike symptoms, presyncope, syncope. CV: Denies chest pain other than related to heartburn symptoms.  Denies heart palpitations. Resp: Intermittent shortness of breath with exertion at baseline.  No regular cough. GI: See HPI. Derm: Denies rash Heme: See HPI.  Physical Exam: BP 120/67   Pulse 61   Temp (!) 96.9 F (36.1 C) (Temporal)   Ht _0  (1.549 m)   Wt 139 lb 3.2 oz (63.1 kg)   BMI 26.30 kg/m  General:   Alert and oriented. No distress noted. Pleasant and cooperative.  Head:  Normocephalic and atraumatic. Eyes:  Conjuctiva clear without scleral icterus. Heart:  S1, S2 present without murmurs appreciated. Lungs:  Clear to auscultation bilaterally. No wheezes, rales, or rhonchi. No distress.  Abdomen:  +BS, soft, and non-distended.  Mild TTP in RUQ and epigastric area. No rebound or guarding. No HSM or masses noted. Msk:  Symmetrical without gross deformities. Normal posture. Extremities:  Without edema. Neurologic:  Alert and  oriented x4 Psych:  Normal mood and affect.

## 2020-09-11 ENCOUNTER — Other Ambulatory Visit: Payer: Self-pay

## 2020-09-11 ENCOUNTER — Ambulatory Visit (INDEPENDENT_AMBULATORY_CARE_PROVIDER_SITE_OTHER): Payer: Medicare Other | Admitting: Gastroenterology

## 2020-09-11 ENCOUNTER — Encounter: Payer: Self-pay | Admitting: *Deleted

## 2020-09-11 ENCOUNTER — Encounter: Payer: Self-pay | Admitting: Gastroenterology

## 2020-09-11 VITALS — BP 120/67 | HR 61 | Temp 96.9°F | Ht 61.0 in | Wt 139.2 lb

## 2020-09-11 DIAGNOSIS — K5900 Constipation, unspecified: Secondary | ICD-10-CM | POA: Diagnosis not present

## 2020-09-11 DIAGNOSIS — R112 Nausea with vomiting, unspecified: Secondary | ICD-10-CM

## 2020-09-11 DIAGNOSIS — R1011 Right upper quadrant pain: Secondary | ICD-10-CM

## 2020-09-11 DIAGNOSIS — K219 Gastro-esophageal reflux disease without esophagitis: Secondary | ICD-10-CM | POA: Diagnosis not present

## 2020-09-11 MED ORDER — FAMOTIDINE 40 MG PO TABS: 40.0000 mg | ORAL_TABLET | Freq: Every day | ORAL | 5 refills | Status: DC

## 2020-09-11 NOTE — Assessment & Plan Note (Addendum)
Chronic history of GERD.  Over the years, she has been on Nexium twice daily, Aciphex daily, Dexilant daily, Protonix twice daily, and most recently switch from Protonix to Prevacid twice daily in mid December due to uncontrolled GERD.  She is currently on Prevacid 30 mg twice daily and Pepcid 20 mg at bedtime which has improved GERD symptoms.  Continues with breakthrough symptoms about 4 days a week, usually in the evening.  States she snacks throughout the day with her main meal typically in the afternoon. Lays down within 3 hours of eating at times. Also eating frozen meals/frozen fried foods such as fish sticks at times. No dysphagia. EGD 10/01/2019: normal esophagus s/p dilation, erythematous mucosa in the stomach consistent with reactive gastropathy on biopsy, no H. pylori, multiple fundic gland polyps based on biopsy. GES March 2021 normal.  Breakthrough symptoms may be triggered by dietary/lifestyle habits.    Plan:  Continue Prevacid 30 mg BID 30 minutes before breakfast and dinner.  Increase Pepcid to 40 mg at bedtime.  Follow a GERD diet:   Avoid fried, fatty, greasy, spicy, citrus foods.  Avoid caffeine and carbonated beverages.  Avoid chocolate.  Try eating 4-6 small meals a day rather than 3 large meals.  Do not eat within 3 hours of laying down.  Prop head of bed up on wood or bricks to create a 6 inch incline. GERD Handout provided.  Follow-up in 2-3 months.

## 2020-09-11 NOTE — Assessment & Plan Note (Signed)
Addressed under nausea and vomiting.

## 2020-09-11 NOTE — Assessment & Plan Note (Addendum)
Chronic history of nausea/vomiting/weight loss of unclear etiology. Multiple imaging studies including CT head, MRI brain, CT A/P without acute findings. EGD 10/01/2019: normal esophagus s/p dilation, erythematous mucosa in the stomach consistent with reactive gastropathy on biopsy, no H. pylori, multiple fundic gland polyps based on biopsy. GES March 2021 normal. Started in Marinol in March 2021 with significant improvement in symptoms. Previously referred to [facility] but was doing well with marinol and advised to follow-up as needed. She has gained 38 lbs since February 2021.   Overall, symptoms continue to be much improved. However, over the last few months, she has been experiencing right sided pain radiating from right shoulder blade anteriorly to RUQ, worsened by meals at times, but also worsened by deep breaths or certain movements, associated with nausea w/o vomiting if pain is more severe. Previously discussed with PCP and neurology who thought symptoms may be related to neuropathy and started on tramadol and lidocaine patches with some improvement, but symptoms continue. History of cholecystectomy. Laboratory evaluation in December for the same symptoms with CBC wnl, CMP with slight elevation of alk phos at 133, and lipase wnl. EGD in 10/01/2019 with normal esophagus s/p dilation, erythematous mucosa in the stomach consistent with reactive gastropathy on biopsy, no H. pylori, multiple fundic gland polyps based on biopsy. Currently on Prevacid 30 mg BID and famotidine 20 mg nightly with intermittent breakthrough GERD symptoms as per above. Constipation not adequately managed as per below. Abdominal exam today with mild TTP in epigastric area and RUQ. No CVA tenderness.   Will need to rule out biliary etiology such as CBD stone. May also have gastritis, duodenitis, less likely PUD as she is on PPI BID, and/or constipation. Suspect she may also have MSK component.   Plan:  Update HFP RUQ [place] Continue Prevacid  30 mg BID Increase Pepcid to 40 mg at bedtime. Counseled on GERD diet/lifestyle. Handout provided.  Continue to avoid NSAIDs.  Resume Linzess 72 mcg daily.   Further recommendations to follow HFP and [place]. If no significant findings and she continues with symptoms despite the above recommendations, will need to consider CT.   Follow-up in 2-3 months.

## 2020-09-11 NOTE — Patient Instructions (Addendum)
Please have labs and ultrasound completed at [facility].  Continue lansoprazole 30 mg twice daily 30 minutes before breakfast and dinner.  Increase famotidine to 40 mg at bedtime.  Follow a GERD diet:  Avoid fried, fatty, greasy, spicy, citrus foods. Avoid caffeine and carbonated beverages. Avoid chocolate. Try eating 4-6 small meals a day rather than 3 large meals. Do not eat within 3 hours of laying down. Prop head of bed up on wood or bricks to create a 6 inch incline.  It is also important that you limit frozen fried foods as well including fish sticks, frozen breaded chicken, etc as these items are also high in fat content.   Resume taking Linzess 72 mcg daily 30 minutes before breakfast.  We will see you back in 2-3 months for follow-up. Please call with questions or concerns prior.   Aliene Altes, PA-C [facility]    Food Choices for Gastroesophageal Reflux Disease, Adult When you have gastroesophageal reflux disease (GERD), the foods you eat and your eating habits are very important. Choosing the right foods can help ease your discomfort. Think about working with a food expert (dietitian) to help you make good choices. What are tips for following this plan? Reading food labels  Look for foods that are low in saturated fat. Foods that may help with your symptoms include: ? Foods that have less than 5% of daily value (DV) of fat. ? Foods that have 0 grams of trans fat. Cooking  Do not fry your food.  Cook your food by baking, steaming, grilling, or broiling. These are all methods that do not need a lot of fat for cooking.  To add flavor, try to use herbs that are low in spice and acidity. Meal planning  Choose healthy foods that are low in fat, such as: ? Fruits and vegetables. ? Whole grains. ? Low-fat dairy products. ? Lean meats, fish, and poultry.  Eat small meals often instead of eating 3 large meals each day. Eat your meals slowly in a  place where you are relaxed. Avoid bending over or lying down until 2-3 hours after eating.  Limit high-fat foods such as fatty meats or fried foods.  Limit your intake of fatty foods, such as oils, butter, and shortening.  Avoid the following as told by your doctor: ? Foods that cause symptoms. These may be different for different people. Keep a food diary to keep track of foods that cause symptoms. ? Alcohol. ? Drinking a lot of liquid with meals. ? Eating meals during the 2-3 hours before bed.   Lifestyle  Stay at a healthy weight. Ask your doctor what weight is healthy for you. If you need to lose weight, work with your doctor to do so safely.  Exercise for at least 30 minutes on 5 or more days each week, or as told by your doctor.  Wear loose-fitting clothes.  Do not smoke or use any products that contain nicotine or tobacco. If you need help quitting, ask your doctor.  Sleep with the head of your bed higher than your feet. Use a wedge under the mattress or blocks under the bed frame to raise the head of the bed.  Chew sugar-free gum after meals. What foods should eat? Eat a healthy, well-balanced diet of fruits, vegetables, whole grains, low-fat dairy products, lean meats, fish, and poultry. Each person is different. Foods that may cause symptoms in one person may not cause any symptoms in another person. Work with your  doctor to find foods that are safe for you. The items listed above may not be a complete list of what you can eat and drink. Contact a food expert for more options.   What foods should I avoid? Limiting some of these foods may help in managing the symptoms of GERD. Everyone is different. Talk with a food expert or your doctor to help you find the exact foods to avoid, if any. Fruits Any fruits prepared with added fat. Any fruits that cause symptoms. For some people, this may include citrus fruits, such as oranges, grapefruit, pineapple, and  lemons. Vegetables Deep-fried vegetables. [place] fries. Any vegetables prepared with added fat. Any vegetables that cause symptoms. For some people, this may include tomatoes and tomato products, chili peppers, onions and garlic, and horseradish. Grains Pastries or quick breads with added fat. Meats and other proteins High-fat meats, such as fatty beef or pork, hot dogs, ribs, ham, sausage, salami, and bacon. Fried meat or protein, including fried fish and fried chicken. Nuts and nut butters, in large amounts. Dairy Whole milk and chocolate milk. Sour cream. Cream. Ice cream. Cream cheese. Milkshakes. Fats and oils Butter. Margarine. Shortening. Ghee. Beverages Coffee and tea, with or without caffeine. Carbonated beverages. Sodas. Energy drinks. Fruit juice made with acidic fruits, such as orange or grapefruit. Tomato juice. Alcoholic drinks. Sweets and desserts Chocolate and cocoa. Donuts. Seasonings and condiments Pepper. Peppermint and spearmint. Added salt. Any condiments, herbs, or seasonings that cause symptoms. For some people, this may include curry, hot sauce, or vinegar-based salad dressings. The items listed above may not be a complete list of what you should not eat and drink. Contact a food expert for more options. Questions to ask your doctor Diet and lifestyle changes are often the first steps that are taken to manage symptoms of GERD. If diet and lifestyle changes do not help, talk with your doctor about taking medicines. Where to find more information  International Foundation for Gastrointestinal Disorders: aboutgerd.org Summary  When you have GERD, food and lifestyle choices are very important in easing your symptoms.  Eat small meals often instead of 3 large meals a day. Eat your meals slowly and in a place where you are relaxed.  Avoid bending over or lying down until 2-3 hours after eating.  Limit high-fat foods such as fatty meats or fried foods. This  information is not intended to replace advice given to you by your health care provider. Make sure you discuss any questions you have with your health care provider. Document Revised: 02/25/2020 Document Reviewed: 02/25/2020 Elsevier Patient Education  [place].

## 2020-09-11 NOTE — Assessment & Plan Note (Addendum)
Chronic. With pelvic floor dysfunction. Previously had been doing well using Linzess 72 mcg as needed. However, recently started on tramadol and has had worsened constipation. Colonoscopy up to date on 05/01/20 with recommendations to repeat in 5 years. Advised Linda English resume Linzess 72 mcg daily.

## 2020-09-18 ENCOUNTER — Other Ambulatory Visit: Payer: Self-pay

## 2020-09-18 ENCOUNTER — Ambulatory Visit (HOSPITAL_COMMUNITY)
Admission: RE | Admit: 2020-09-18 | Discharge: 2020-09-18 | Disposition: A | Discharge status: Home / Self Care | Payer: Medicare Other | Source: Ambulatory Visit | Attending: Gastroenterology | Admitting: Gastroenterology

## 2020-09-18 ENCOUNTER — Other Ambulatory Visit (HOSPITAL_COMMUNITY)
Admission: RE | Admit: 2020-09-18 | Discharge: 2020-09-18 | Disposition: A | Discharge status: Home / Self Care | Payer: Medicare Other | Source: Ambulatory Visit | Attending: Gastroenterology | Admitting: Gastroenterology

## 2020-09-18 DIAGNOSIS — R1011 Right upper quadrant pain: Secondary | ICD-10-CM | POA: Insufficient documentation

## 2020-09-18 LAB — HEPATIC FUNCTION PANEL
ALT: 48 U/L — ABNORMAL HIGH (ref 0–44)
AST: 14 U/L — ABNORMAL LOW (ref 15–41)
Albumin: 3.8 g/dL (ref 3.5–5.0)
Alkaline Phosphatase: 123 U/L (ref 38–126)
Bilirubin, Direct: <0.1 mg/dL (ref 0.0–0.2)
Total Bilirubin: 0.2 mg/dL — ABNORMAL LOW (ref 0.3–1.2)
Total Protein: 7.2 g/dL (ref 6.5–8.1)

## 2020-09-19 ENCOUNTER — Other Ambulatory Visit: Payer: Self-pay | Admitting: Gastroenterology

## 2020-09-19 DIAGNOSIS — K219 Gastro-esophageal reflux disease without esophagitis: Secondary | ICD-10-CM

## 2020-09-19 MED ORDER — OMEPRAZOLE 40 MG PO CPDR: 40.0000 mg | DELAYED_RELEASE_CAPSULE | Freq: Two times a day (BID) | ORAL | 3 refills | Status: DC

## 2020-09-22 ENCOUNTER — Other Ambulatory Visit: Payer: Self-pay | Admitting: Gastroenterology

## 2020-09-22 ENCOUNTER — Other Ambulatory Visit: Payer: Self-pay

## 2020-09-22 ENCOUNTER — Telehealth: Payer: Self-pay | Admitting: Internal Medicine

## 2020-09-22 DIAGNOSIS — R7989 Other specified abnormal findings of blood chemistry: Secondary | ICD-10-CM

## 2020-09-22 NOTE — Telephone Encounter (Signed)
Routing to [facility] refill box. Marinol 2.5 mg.

## 2020-09-22 NOTE — Telephone Encounter (Signed)
Pt said she needed a refill on her Marinol. She uses [facility] on [address].

## 2020-09-23 NOTE — Telephone Encounter (Signed)
Rx printed and will need to be faxed to pharmacy.

## 2020-09-23 NOTE — Telephone Encounter (Signed)
RX faxed to pharmacy.

## 2020-09-25 LAB — HEPATITIS C ANTIBODY
Hepatitis C Ab: NONREACTIVE
SIGNAL TO CUT-OFF: 0 (ref <1.00)

## 2020-09-25 LAB — HEPATITIS B SURFACE ANTIBODY,QUALITATIVE: Hep B S Ab: NONREACTIVE

## 2020-09-25 LAB — HEPATITIS A ANTIBODY, TOTAL: Hepatitis A AB,Total: NONREACTIVE

## 2020-09-25 LAB — HEPATITIS B CORE ANTIBODY, TOTAL: Hep B Core Total Ab: NONREACTIVE

## 2020-09-25 LAB — HEPATITIS B SURFACE ANTIGEN: Hepatitis B Surface Ag: NONREACTIVE

## 2020-09-29 ENCOUNTER — Other Ambulatory Visit: Payer: Self-pay | Admitting: Gastroenterology

## 2020-10-23 ENCOUNTER — Telehealth: Payer: Self-pay | Admitting: Internal Medicine

## 2020-10-23 NOTE — Telephone Encounter (Signed)
Lmom, waiting on a return call.  

## 2020-10-23 NOTE — Telephone Encounter (Signed)
PATIENT STOPPED TAKING THE OMEPRAZOLE BECAUSE OF THE SIDE EFFECTS.  WANTED TO LET us KNOW.  SAID IT WAS CAUSING ANXIETY AND DIZZINESS

## 2020-10-24 NOTE — Telephone Encounter (Signed)
As far as I know, patient was taking Prevacid twice daily.  However, if she is doing okay on famotidine daily, she is okay to continue this.  We can increase this to twice daily if needed.  Otherwise, keep upcoming office visit.

## 2020-10-24 NOTE — Telephone Encounter (Signed)
FYI Spoke with pt. Pt d/c Omeprazole due to the side effects she was having (anxiety and dizziness). Pt is taking Famotidine 40 mg daily.

## 2020-10-27 NOTE — Telephone Encounter (Signed)
Noted. Pt is aware that she can take Pepcid twice daily if Pepcid isn't working taking it once daily. Pt will follow up 10/2020 as directed.

## 2020-11-13 ENCOUNTER — Encounter: Payer: Self-pay | Admitting: Gastroenterology

## 2020-11-13 ENCOUNTER — Ambulatory Visit (INDEPENDENT_AMBULATORY_CARE_PROVIDER_SITE_OTHER): Payer: Medicare Other | Admitting: Gastroenterology

## 2020-11-13 ENCOUNTER — Other Ambulatory Visit: Payer: Self-pay

## 2020-11-13 VITALS — BP 131/72 | HR 60 | Temp 97.3°F | Ht 61.0 in | Wt 148.0 lb

## 2020-11-13 DIAGNOSIS — R7989 Other specified abnormal findings of blood chemistry: Secondary | ICD-10-CM

## 2020-11-13 LAB — HEPATIC FUNCTION PANEL
AG Ratio: 1.8 (calc) (ref 1.0–2.5)
ALT: 9 U/L (ref 6–29)
AST: 11 U/L (ref 10–35)
Albumin: 4.2 g/dL (ref 3.6–5.1)
Alkaline phosphatase (APISO): 118 U/L (ref 37–153)
Bilirubin, Direct: 0.1 mg/dL (ref 0.0–0.2)
Globulin: 2.4 g/dL (calc) (ref 1.9–3.7)
Indirect Bilirubin: 0.2 mg/dL (calc) (ref 0.2–1.2)
Total Bilirubin: 0.3 mg/dL (ref 0.2–1.2)
Total Protein: 6.6 g/dL (ref 6.1–8.1)

## 2020-11-13 MED ORDER — PANTOPRAZOLE SODIUM 40 MG PO TBEC: 40.0000 mg | DELAYED_RELEASE_TABLET | Freq: Every day | ORAL | 3 refills | Status: DC

## 2020-11-13 MED ORDER — LUBIPROSTONE 8 MCG PO CAPS: 8.0000 ug | ORAL_CAPSULE | Freq: Two times a day (BID) | ORAL | 3 refills | Status: DC

## 2020-11-13 NOTE — Patient Instructions (Signed)
Let's stop dronabinol (Marinol). This was for nausea.   Stop omeprazole as you have done. Start pantoprazole (Protonix) once each morning, 30 minutes before breakfast.  Stop Linzess. Let's do Amitiza WITH FOOD twice a day. This is for constipation. If needed, you can just take once a day. We can also do a higher strength if needed.   Please complete blood work today.   We will see you in 4 months!  I enjoyed seeing you again today! As you know, I value our relationship and want to provide genuine, compassionate, and quality care. I welcome your feedback. If you receive a survey regarding your visit,  I greatly appreciate you taking time to fill this out. See you next time!  Annitta Needs, PhD, ANP-BC [facility]

## 2020-11-13 NOTE — Progress Notes (Signed)
Referring Provider: [name], MD Primary Care Physician:  [name], MD Primary GI: Dr. Gala Romney   Chief Complaint  Patient presents with  . Gastroesophageal Reflux    Has reflux and some days whatever she may eat comes back up. She stopped omeprazole bc felt it was causing constant anxiety attacks. Stopped few months now. Only taking pepcid at bedtime  . Constipation    Can go several days without BM. She is not able to take Linzess every day bc it causing a lot of diarrhea, not able to make it to bathroom, not able to go anywhere when she takes it.     HPI:   Linda English is a 65 y.o. 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 Evaluated at [facility] with recommendations to continue Marinol, which helped significantly with nausea.GES normal.  Here for routine follow-up.   Historically, bowel habits difficult to manage. Slightly elevated alk phos in Jan 2022. Due for repeat HFP now. Gallbladder absent.    Stopped taking omeprazole on her own. Difficult to tell when she had been on omeprazole, as she had been on lansoprazole and recommended to do that but at some point was changed. Started having anxiety attacks with omeprazole. Lansoprazole: stopped working. Had done well with Protonix in the past.   Can't take Linzess 72 mcg daily as it causes diarrhea. Not taking dronabinol routinely. Nausea only rare. Gaining weight. Much improved.       Past Medical History:  Diagnosis Date  . Acid reflux   . Allergic rhinitis   . Anxiety   . Arthritis    osteoarthritis  . Bipolar disorder ([place])   . Chronic headache   .  Depression   . Diverticula of colon   . DM (diabetes mellitus) ([place])   . Gastric erosions   . Gastric polyps    benign   . Hemorrhoids   . Hiatal hernia   . Hypertension   . IBS (irritable bowel syndrome)   . Lichen planus   . Obstructive sleep apnea   . Panic disorder   . Parkinson's disease ([place])   . Rectal cancer ([place]) 2003   ileostomy and reversal  . Subdural hematoma ([place])   . Tubular adenoma     Past Surgical History:  Procedure Laterality Date  . ABDOMINAL HYSTERECTOMY    . APPENDECTOMY    . BACK SURGERY    . BIOPSY  10/01/2019   Procedure: BIOPSY;  Surgeon: Daneil Dolin, MD;  [facility];  Service: Endoscopy;;  gastric  . CESAREAN SECTION     X  2  . CHOLECYSTECTOMY    . COLONOSCOPY  08/2007   DR Gala Romney, friable anal canal hemorrhoids, surgical anastomosis at 3cm, distal scattered tics  . COLONOSCOPY  12/15/2010   anal papilla and internal hemorrhoids/diminutive polyp in the base of the cecum. Past, tubular adenoma.. Next colonoscopy due in April 2017.  Marland Kitchen COLONOSCOPY  10/27/2005   RMR: Anal canal hemorrhoids. Surgical anastomosis at 3 cm from the anal verge appeared normal. Few scattered distal diverticula. The residual  colonic mucosa appeared normal. I suspect the patient bled from hemorrhoids  . COLONOSCOPY N/A 01/15/2015  KXF:GHWEXH residual rectum and colon. next tcs 12/2019  . COLONOSCOPY WITH PROPOFOL N/A 05/01/2020   Procedure: COLONOSCOPY WITH PROPOFOL;  Surgeon: Daneil Dolin, MD;  Two 3-8 millimeters polyps in ascending colon, diverticulosis in descending colon, surgical anastomosis at 4 cm, otherwise normal exam.  Pathology with tubular adenomas.  Repeat colonoscopy in 5 years.  . ESOPHAGEAL DILATION N/A 06/27/2015   Procedure: ESOPHAGEAL DILATION;  Surgeon: Daneil Dolin, MD;  [facility];  Service: Endoscopy;  Laterality: N/A;  . ESOPHAGOGASTRODUODENOSCOPY  05/2002   DR ROURK, normal  . ESOPHAGOGASTRODUODENOSCOPY  08/03/2011    RMR: small HH/ gastric polyps  . ESOPHAGOGASTRODUODENOSCOPY N/A 06/27/2015   Dr. Gala Romney: Mild erosive reflux esophagitits. Status post passage of a Maloney dilator. Hiatal hernia. Gastric polyps and abnormal gastric mucosa of doubtful clinical significane. status post biopsy, benign fundic gland polyp, negative H.pylori  . ESOPHAGOGASTRODUODENOSCOPY (EGD) WITH PROPOFOL N/A 10/01/2019   rourk: Normal esophagus status post esophageal dilation for dysphagia, erythematous mucosa in the stomach biopsy consistent with reactive gastropathy, no H. pylori, multiple gastric polyps (biopsy fundic gland)  . low anterior rection  2003   RECTAL CANCER  . MALONEY DILATION N/A 10/01/2019   Procedure: Venia Minks DILATION;  Surgeon: Daneil Dolin, MD;  [facility];  Service: Endoscopy;  Laterality: N/A;  . POLYPECTOMY  05/01/2020   Procedure: POLYPECTOMY;  Surgeon: Daneil Dolin, MD;  [facility];  Service: Endoscopy;;  . SPINE SURGERY  2001   L5,S1 HEMILAMINOTOMY AND DISCECTOMY/DR DEATON    Current Outpatient Medications  Medication Sig Dispense Refill  . aspirin 81 MG chewable tablet Chew 1 tablet (81 mg total) by mouth daily.    Marland Kitchen diltiazem (TIAZAC) 240 MG 24 hr capsule Take 1 capsule by mouth daily at 12 noon.    . dronabinol (MARINOL) 2.5 MG capsule TAKE 1 CAPSULE BY MOUTH TWICE DAILY BEFORE A MEAL 60 capsule 3  . famotidine (PEPCID) 40 MG tablet Take 1 tablet (40 mg total) by mouth at bedtime. (Patient taking differently: Take 20 mg by mouth at bedtime.) 30 tablet 5  . ferrous sulfate 325 (65 FE) MG tablet Take 1 tablet (325 mg total) by mouth daily with breakfast.  3  . lamoTRIgine (LAMICTAL) 200 MG tablet Take 200 mg by mouth 2 (two) times daily.    Marland Kitchen lidocaine (LIDODERM) 5 % Place 2 patches onto the skin as needed.    Marland Kitchen LINZESS 72 MCG capsule TAKE 1 CAPSULE(72 MCG) BY MOUTH DAILY BEFORE BREAKFAST (Patient taking differently: 1 capsule every 3 days) 30 capsule 5  . LORazepam (ATIVAN)  1 MG tablet Take 1 mg by mouth 3 (three) times daily as needed for anxiety.    Marland Kitchen losartan (COZAAR) 50 MG tablet Take 1 tablet by mouth at bedtime.    . metoprolol succinate (TOPROL-XL) 50 MG 24 hr tablet Take 1 tablet (50 mg total) by mouth daily. Take with or immediately following a meal. 30 tablet 1  . OXcarbazepine (TRILEPTAL) 150 MG tablet Take 2 tablets by mouth daily.    . potassium chloride 20 MEQ/15ML (10%) SOLN Take by mouth daily. 22ml's    . traMADol (ULTRAM) 50 MG tablet Take 50 mg by mouth every 12 (twelve) hours as needed.    . vitamin B-12 (CYANOCOBALAMIN) 500 MCG tablet Take 1 tablet (500 mcg total) by mouth daily. 30 tablet 5  . acetaminophen (TYLENOL) 500 MG tablet Take 1,000 mg by mouth every 8 (eight) hours as needed  for moderate pain or headache.    . clonazePAM (KLONOPIN) 1 MG tablet Take 1 mg by mouth 2 (two) times daily as needed.    . furosemide (LASIX) 40 MG tablet Take 1 tablet (40 mg total) by mouth daily. (Patient not taking: Reported on 11/13/2020) 30 tablet 1  . Melatonin 5 MG CAPS Take 10 mg by mouth at bedtime as needed (sleep). (Patient not taking: Reported on 11/13/2020)    . omeprazole (PRILOSEC) 40 MG capsule Take 1 capsule (40 mg total) by mouth 2 (two) times daily before a meal. (Patient not taking: Reported on 11/13/2020) 60 capsule 3  . thiamine 100 MG tablet Take 1 tablet (100 mg total) by mouth daily. (Patient not taking: Reported on 09/11/2020) 30 tablet 5  . triamcinolone cream (KENALOG) 0.1 % Apply 1 application topically 2 (two) times daily as needed (rash). (Patient not taking: Reported on 11/13/2020)     No current facility-administered medications for this visit.    Allergies as of 11/13/2020 - Review Complete 11/13/2020  Allergen Reaction Noted  . Lithium Nausea And Vomiting 06/27/2015  . Excedrin extra strength [aspirin-acetaminophen-caffeine] Other (See Comments) 10/28/2014  . Geodon [ziprasidone hcl]  05/01/2020  . Klonopin [clonazepam] Other  (See Comments) 10/23/2019    Family History  Problem Relation Age of Onset  . Colon cancer Paternal Grandfather 15  . Colon polyps Brother   . Colon polyps Cousin   . Diabetes Mother   . Hypertension Mother   . Hypertension Father   . Heart attack Father   . Heart failure Father     Social History   Socioeconomic History  . Marital status: Divorced    Spouse name: Not on file  . Number of children: Not on file  . Years of education: Not on file  . Highest education level: Not on file  Occupational History  . Occupation: Product manager: RETIRED    Comment: [employer]  Tobacco Use  . Smoking status: Never Smoker  . Smokeless tobacco: Never Used  . Tobacco comment: Never smoked  Vaping Use  . Vaping Use: Never used  Substance and Sexual Activity  . Alcohol use: No  . Drug use: No  . Sexual activity: Never  Other Topics Concern  . Not on file  Social History Narrative  . Not on file   Social Determinants of Health   Financial Resource Strain: Not on file  Food Insecurity: Not on file  Transportation Needs: Not on file  Physical Activity: Not on file  Stress: Not on file  Social Connections: Not on file    Review of Systems: Gen: Denies fever, chills, anorexia. Denies fatigue, weakness, weight loss.  CV: Denies chest pain, palpitations, syncope, peripheral edema, and claudication. Resp: Denies dyspnea at rest, cough, wheezing, coughing up blood, and pleurisy. GI: see HPI Derm: Denies rash, itching, dry skin Psych: Denies depression, anxiety, memory loss, confusion. No homicidal or suicidal ideation.  Heme: Denies bruising, bleeding, and enlarged lymph nodes.  Physical Exam: BP 131/72   Pulse 60   Temp (!) 97.3 F (36.3 C)   Ht _0  (1.549 m)   Wt 148 lb (67.1 kg)   BMI 27.96 kg/m  General:   Alert and oriented. No distress noted. Pleasant and cooperative.  Head:  Normocephalic and atraumatic. Eyes:  Conjuctiva clear without scleral  icterus. Mouth:  Mask in place Abdomen:  +BS, soft, non-tender and non-distended. No rebound or guarding. No HSM or masses noted. Msk:  Symmetrical without gross deformities. Normal posture. Extremities:  Without edema. Neurologic:  Alert and  oriented x4 Psych:  Alert and cooperative. Normal mood and affect.  ASSESSMENT: Linda English is a 65 y.o. female presenting today with complicated GI history as noted above, in follow-up for constipation, GERD, chronic nausea, weight loss.   Constipation: not ideally managed as Linzess continues to overshoot the mark even at low doses. Will start Amitiza 8 mcg po BID with food.   GERD: stating omeprazole caused anxiety, lansoprazole lost efficacy. Has done well with pantoprazole in the past. Will resume this daily.   Chronic nausea: no longer taking Marinol. Will stop this. GES normal.   Weight loss: resolved.   History of rectal cancer: colonoscopy up-to-date.   History of mildly elevated LFTs: recheck HFP now.    PLAN:  Amitiza 8 mcg po BID Start pantoprazole 40 mg daily Stop Marinol Recheck HFP Colonoscopy 2026 Return in 4 months   Annitta Needs, PhD, [facility]

## 2020-11-27 DIAGNOSIS — Z79899 Other long term (current) drug therapy: Secondary | ICD-10-CM | POA: Diagnosis not present

## 2020-11-27 DIAGNOSIS — E1129 Type 2 diabetes mellitus with other diabetic kidney complication: Secondary | ICD-10-CM | POA: Diagnosis not present

## 2020-11-27 DIAGNOSIS — N183 Chronic kidney disease, stage 3 unspecified: Secondary | ICD-10-CM | POA: Diagnosis not present

## 2020-11-27 DIAGNOSIS — M859 Disorder of bone density and structure, unspecified: Secondary | ICD-10-CM | POA: Diagnosis not present

## 2020-11-27 DIAGNOSIS — I5032 Chronic diastolic (congestive) heart failure: Secondary | ICD-10-CM | POA: Diagnosis not present

## 2020-12-04 DIAGNOSIS — R7309 Other abnormal glucose: Secondary | ICD-10-CM | POA: Diagnosis not present

## 2020-12-04 DIAGNOSIS — I5030 Unspecified diastolic (congestive) heart failure: Secondary | ICD-10-CM | POA: Diagnosis not present

## 2020-12-04 DIAGNOSIS — E1122 Type 2 diabetes mellitus with diabetic chronic kidney disease: Secondary | ICD-10-CM | POA: Diagnosis not present

## 2020-12-04 DIAGNOSIS — N1831 Chronic kidney disease, stage 3a: Secondary | ICD-10-CM | POA: Diagnosis not present

## 2020-12-10 ENCOUNTER — Other Ambulatory Visit: Payer: Self-pay

## 2020-12-10 DIAGNOSIS — R7989 Other specified abnormal findings of blood chemistry: Secondary | ICD-10-CM

## 2020-12-22 ENCOUNTER — Telehealth: Payer: Self-pay | Admitting: Internal Medicine

## 2020-12-22 NOTE — Telephone Encounter (Signed)
Spoke with pt. Pt completed this lab in 10/2020. Pt doesn't need to complete lab orders received.  Linda English- pt noticed on her lab orders that her insurance should be listed as [facility] Medicare primary insurance and [employer] as secondary. I'm not sure if that can be changed in the system.

## 2020-12-22 NOTE — Telephone Encounter (Signed)
It is corrected

## 2020-12-22 NOTE — Telephone Encounter (Signed)
Noted  

## 2020-12-22 NOTE — Telephone Encounter (Signed)
Pt has questions about her labs. She is going today to have them done. [phone]

## 2021-02-17 ENCOUNTER — Telehealth: Payer: Self-pay | Admitting: Internal Medicine

## 2021-02-17 NOTE — Telephone Encounter (Signed)
Please call patient, she is having issues with her medication, she is not able to go to the bathroom

## 2021-02-17 NOTE — Telephone Encounter (Signed)
Spoke to pt.  Started Amitiza in March.  Worked too good at first but no longer works.  Takes twice a day with food.  Constipation for a month now.  No normal bm's.  Having to pull stool out.  Mushy but formed.  Occasional rectal bleeding with bm's.  Acid reflux when eating or drinking.  No nausea, vomiting, or fever.

## 2021-02-17 NOTE — Telephone Encounter (Signed)
May add Miralax each evening as needed. Continue Amitiza. Has been difficult to manage bowel regimen historically. Keep appt for 7/26.

## 2021-02-17 NOTE — Telephone Encounter (Signed)
Spoke to pt.  She was informed to add Miralax each evening as needed.  She was advised to continue Amitiza.  Pt informed to keep appt for 03/24/2021.  Pt voiced understanding.

## 2021-02-20 ENCOUNTER — Other Ambulatory Visit: Payer: Self-pay

## 2021-02-20 ENCOUNTER — Emergency Department (HOSPITAL_COMMUNITY)
Admission: EM | Admit: 2021-02-20 | Discharge: 2021-02-20 | Disposition: A | Discharge status: Home / Self Care | Payer: Medicare Other | Attending: Emergency Medicine | Admitting: Emergency Medicine

## 2021-02-20 ENCOUNTER — Emergency Department (HOSPITAL_COMMUNITY): Payer: Medicare Other

## 2021-02-20 ENCOUNTER — Encounter (HOSPITAL_COMMUNITY): Payer: Self-pay | Admitting: Emergency Medicine

## 2021-02-20 DIAGNOSIS — M47812 Spondylosis without myelopathy or radiculopathy, cervical region: Secondary | ICD-10-CM | POA: Diagnosis not present

## 2021-02-20 DIAGNOSIS — W19XXXA Unspecified fall, initial encounter: Secondary | ICD-10-CM

## 2021-02-20 DIAGNOSIS — R0781 Pleurodynia: Secondary | ICD-10-CM | POA: Diagnosis not present

## 2021-02-20 DIAGNOSIS — S065X0A Traumatic subdural hemorrhage without loss of consciousness, initial encounter: Secondary | ICD-10-CM | POA: Diagnosis not present

## 2021-02-20 DIAGNOSIS — F419 Anxiety disorder, unspecified: Secondary | ICD-10-CM | POA: Diagnosis not present

## 2021-02-20 DIAGNOSIS — I5031 Acute diastolic (congestive) heart failure: Secondary | ICD-10-CM | POA: Insufficient documentation

## 2021-02-20 DIAGNOSIS — Z79899 Other long term (current) drug therapy: Secondary | ICD-10-CM | POA: Insufficient documentation

## 2021-02-20 DIAGNOSIS — Z7982 Long term (current) use of aspirin: Secondary | ICD-10-CM | POA: Insufficient documentation

## 2021-02-20 DIAGNOSIS — H9190 Unspecified hearing loss, unspecified ear: Secondary | ICD-10-CM | POA: Insufficient documentation

## 2021-02-20 DIAGNOSIS — M545 Low back pain, unspecified: Secondary | ICD-10-CM | POA: Diagnosis not present

## 2021-02-20 DIAGNOSIS — R519 Headache, unspecified: Secondary | ICD-10-CM | POA: Insufficient documentation

## 2021-02-20 DIAGNOSIS — R6889 Other general symptoms and signs: Secondary | ICD-10-CM | POA: Diagnosis not present

## 2021-02-20 DIAGNOSIS — I130 Hypertensive heart and chronic kidney disease with heart failure and stage 1 through stage 4 chronic kidney disease, or unspecified chronic kidney disease: Secondary | ICD-10-CM | POA: Diagnosis not present

## 2021-02-20 DIAGNOSIS — Z85048 Personal history of other malignant neoplasm of rectum, rectosigmoid junction, and anus: Secondary | ICD-10-CM | POA: Diagnosis not present

## 2021-02-20 DIAGNOSIS — E1110 Type 2 diabetes mellitus with ketoacidosis without coma: Secondary | ICD-10-CM | POA: Insufficient documentation

## 2021-02-20 DIAGNOSIS — N1831 Chronic kidney disease, stage 3a: Secondary | ICD-10-CM | POA: Insufficient documentation

## 2021-02-20 DIAGNOSIS — M546 Pain in thoracic spine: Secondary | ICD-10-CM | POA: Diagnosis not present

## 2021-02-20 DIAGNOSIS — E876 Hypokalemia: Secondary | ICD-10-CM | POA: Insufficient documentation

## 2021-02-20 DIAGNOSIS — E1122 Type 2 diabetes mellitus with diabetic chronic kidney disease: Secondary | ICD-10-CM | POA: Insufficient documentation

## 2021-02-20 DIAGNOSIS — Z743 Need for continuous supervision: Secondary | ICD-10-CM | POA: Diagnosis not present

## 2021-02-20 DIAGNOSIS — R0689 Other abnormalities of breathing: Secondary | ICD-10-CM | POA: Diagnosis not present

## 2021-02-20 DIAGNOSIS — G4489 Other headache syndrome: Secondary | ICD-10-CM | POA: Diagnosis not present

## 2021-02-20 DIAGNOSIS — I10 Essential (primary) hypertension: Secondary | ICD-10-CM | POA: Diagnosis not present

## 2021-02-20 DIAGNOSIS — I6523 Occlusion and stenosis of bilateral carotid arteries: Secondary | ICD-10-CM | POA: Diagnosis not present

## 2021-02-20 DIAGNOSIS — Z043 Encounter for examination and observation following other accident: Secondary | ICD-10-CM | POA: Diagnosis not present

## 2021-02-20 LAB — BASIC METABOLIC PANEL
Anion gap: 12 (ref 5–15)
BUN: 20 mg/dL (ref 8–23)
CO2: 22 mmol/L (ref 22–32)
Calcium: 9.1 mg/dL (ref 8.9–10.3)
Chloride: 104 mmol/L (ref 98–111)
Creatinine, Ser: 1.39 mg/dL — ABNORMAL HIGH (ref 0.44–1.00)
GFR, Estimated: 43 mL/min — ABNORMAL LOW (ref >60)
Glucose, Bld: 213 mg/dL — ABNORMAL HIGH (ref 70–99)
Potassium: 3.4 mmol/L — ABNORMAL LOW (ref 3.5–5.1)
Sodium: 138 mmol/L (ref 135–145)

## 2021-02-20 LAB — CBC
HCT: 32.9 % — ABNORMAL LOW (ref 36.0–46.0)
Hemoglobin: 10.7 g/dL — ABNORMAL LOW (ref 12.0–15.0)
MCH: 28.5 pg (ref 26.0–34.0)
MCHC: 32.5 g/dL (ref 30.0–36.0)
MCV: 87.5 fL (ref 80.0–100.0)
Platelets: 231 10*3/uL (ref 150–400)
RBC: 3.76 MIL/uL — ABNORMAL LOW (ref 3.87–5.11)
RDW: 13.1 % (ref 11.5–15.5)
WBC: 6.1 10*3/uL (ref 4.0–10.5)
nRBC: 0 % (ref 0.0–0.2)

## 2021-02-20 MED ORDER — LORAZEPAM 1 MG PO TABS
1.0000 mg | ORAL_TABLET | Freq: Once | ORAL | Status: AC
  Administered 2021-02-20: 1 mg via ORAL
  Filled 2021-02-20: qty 1

## 2021-02-20 MED ORDER — FENTANYL CITRATE (PF) 100 MCG/2ML IJ SOLN
25.0000 ug | Freq: Once | INTRAMUSCULAR | Status: AC
Start: 2021-02-20 — End: 2021-02-20
  Administered 2021-02-20: 25 ug via INTRAVENOUS
  Filled 2021-02-20: qty 2

## 2021-02-20 NOTE — ED Notes (Signed)
Patient transported to X-ray 

## 2021-02-20 NOTE — ED Triage Notes (Signed)
Pt from home via [facility]. Pt reports falling this morning. Pt is now C/O "pins and needles all over my body." Pt reports posterior head pain.

## 2021-02-20 NOTE — ED Provider Notes (Signed)
[facility] EMERGENCY DEPARTMENT Provider Note   CSN: [id number] Arrival date & time: 02/20/21  1012     History Chief Complaint  Patient presents with   Fall    Linda English is a 65 y.o. female.  HPI 65 year old female with a history of DM type II, bipolar disorder, anxiety, IBS, hypertension, Parkinson's disease presents to the ER after a fall.  Patient is a difficult historian, very anxious, states that she fell but does not know how, thinks that she may have tripped over a chair.  She denies any LOC, but states the back of her head hurts.  States she hurts from "head to toe", despite multiple attempts to identify her source of pain.  Denies any chest pain, dizziness, shortness of breath.  I spoke with the patient's daughter, reportedly the patient was having a panic attack when her daughter went to go get her medicines.  Patient had gotten up and unfortunately tripped and fallen.  Patient has a history of frequent falls, with a prior history of subdural hematoma.  Past Medical History:  Diagnosis Date   Acid reflux    Allergic rhinitis    Anxiety    Arthritis    osteoarthritis   Bipolar disorder (HCC)    Chronic headache    Depression    Diverticula of colon    DM (diabetes mellitus) ([place])    Gastric erosions    Gastric polyps    benign    Hemorrhoids    Hiatal hernia    Hypertension    IBS (irritable bowel syndrome)    Lichen planus    Obstructive sleep apnea    Panic disorder    Parkinson's disease ([place])    Rectal cancer ([place]) 2003   ileostomy and reversal   Subdural hematoma (HCC)    Tubular adenoma     Patient Active Problem List   Diagnosis Date Noted   RUQ abdominal pain 09/11/2020   History of rectal cancer 02/15/2020   Alternating constipation and diarrhea 02/15/2020   Underweight 02/12/2020   Sensory disturbance 01/18/2020   Exertional dyspnea 01/17/2020   Hypokalemia 40/98/1191   Acute diastolic CHF (congestive heart failure) ([place])  01/17/2020   Acute respiratory failure with hypoxia ([place]) 01/17/2020   Hypoxia    Hypercortisolemia ([place]) 12/11/2019   Protein-calorie malnutrition, severe 10/25/2019   Acute renal failure superimposed on stage 3a chronic kidney disease ([place]) 10/24/2019   Intractable vomiting with nausea 10/24/2019   Acute renal failure superimposed on stage 3 chronic kidney disease ([place]) 47/82/9562   Acute metabolic encephalopathy 13/03/6577   Vertigo as late effect of stroke 08/13/2019   Dizziness 08/13/2019   DKA (diabetic ketoacidoses) 08/12/2019   Rt Sided Subdural hematoma, post-traumatic  07/02/2019   AKI (acute kidney injury) ([place]) 06/26/2019   ARF (acute renal failure) ([place]) 06/24/2019   Nausea & vomiting 06/05/2019   Diarrhea 07/27/2017   Transient ischemic attack 08/09/2015   Reflux esophagitis    Gastric polyp    Dyspepsia 06/13/2015   Dysphagia 06/13/2015   Incisional irritation 06/18/2013   Wound drainage 06/18/2013   Bipolar 1 disorder ([place]) 06/08/2012    Class: Chronic   Anorexia 05/23/2012   Insomnia 05/23/2012   Weight loss 05/23/2012   Abnormal weight loss 04/10/2012   Esophageal dysphagia 04/10/2012   Nausea 09/08/2011   GERD (gastroesophageal reflux disease) 07/14/2011   Epigastric pain 07/14/2011   Constipation 07/14/2011   Knee pain 02/09/2011   OA (osteoarthritis) of knee 02/09/2011   Family  hx of colon cancer 11/30/2010   Bowel habit changes 11/30/2010   OSTEOARTHRITIS, KNEE 08/05/2010   PES PLANUS 08/05/2010   RECTAL CANCER 10/23/2007   Diabetes ([place]) 10/23/2007   HYPERLIPIDEMIA 10/23/2007   Anxiety state 10/23/2007   PANIC DISORDER 10/23/2007   DEPRESSION 10/23/2007   OBSTRUCTIVE SLEEP APNEA 10/23/2007   Essential hypertension 10/23/2007   ALLERGIC RHINITIS 10/23/2007   IBS 10/23/2007   HEADACHE, CHRONIC 10/23/2007    Past Surgical History:  Procedure Laterality Date   ABDOMINAL HYSTERECTOMY     APPENDECTOMY     BACK SURGERY     BIOPSY  10/01/2019    Procedure: BIOPSY;  Surgeon: Daneil Dolin, MD;  Location: [facility] ENDO SU[facility];  Service: Endoscopy;;  gastric   CESAREAN SECTION     X  2   CHOLECYSTECTOMY     COLONOSCOPY  08/2007   DR Gala Romney, friable anal canal hemorrhoids, surgical anastomosis at 3cm, distal scattered tics   COLONOSCOPY  12/15/2010   anal papilla and internal hemorrhoids/diminutive polyp in the base of the cecum. Past, tubular adenoma.. Next colonoscopy due in April 2017.   COLONOSCOPY  10/27/2005   RMR: Anal canal hemorrhoids. Surgical anastomosis at 3 cm from the anal verge appeared normal. Few scattered distal diverticula. The residual  colonic mucosa appeared normal. I suspect the patient bled from hemorrhoids   COLONOSCOPY N/A 01/15/2015   QMV:HQIONG residual rectum and colon. next tcs 12/2019   COLONOSCOPY WITH PROPOFOL N/A 05/01/2020   Procedure: COLONOSCOPY WITH PROPOFOL;  Surgeon: Daneil Dolin, MD;  Two 3-8 millimeters polyps in ascending colon, diverticulosis in descending colon, surgical anastomosis at 4 cm, otherwise normal exam.  Pathology with tubular adenomas.  Repeat colonoscopy in 5 years.   ESOPHAGEAL DILATION N/A 06/27/2015   Procedure: ESOPHAGEAL DILATION;  Surgeon: Daneil Dolin, MD;  Location: [facility];  Service: Endoscopy;  Laterality: N/A;   ESOPHAGOGASTRODUODENOSCOPY  05/2002   DR Gala Romney, normal   ESOPHAGOGASTRODUODENOSCOPY  08/03/2011   RMR: small HH/ gastric polyps   ESOPHAGOGASTRODUODENOSCOPY N/A 06/27/2015   Dr. Gala Romney: Mild erosive reflux esophagitits. Status post passage of a Maloney dilator. Hiatal hernia. Gastric polyps and abnormal gastric mucosa of doubtful clinical significane. status post biopsy, benign fundic gland polyp, negative H.pylori   ESOPHAGOGASTRODUODENOSCOPY (EGD) WITH PROPOFOL N/A 10/01/2019   rourk: Normal esophagus status post esophageal dilation for dysphagia, erythematous mucosa in the stomach biopsy consistent with reactive gastropathy, no H. pylori, multiple gastric polyps  (biopsy fundic gland)   low anterior rection  2003   RECTAL CANCER   MALONEY DILATION N/A 10/01/2019   Procedure: MALONEY DILATION;  Surgeon: Daneil Dolin, MD;  Location[facility];  Service: Endoscopy;  Laterality: N/A;   POLYPECTOMY  05/01/2020   Procedure: POLYPECTOMY;  Surgeon: Daneil Dolin, MD;  Location: [facility];  Service: Endoscopy;;   SPINE SURGERY  2001   L5,S1 HEMILAMINOTOMY AND DISCECTOMY/DR DEATON     OB History     Gravida      Para      Term      Preterm      AB      Living  2      SAB      IAB      Ectopic      Multiple      Live Births              Family History  Problem Relation Age of Onset   Colon cancer  Paternal Grandfather 24   Colon polyps Brother    Colon polyps Cousin    Diabetes Mother    Hypertension Mother    Hypertension Father    Heart attack Father    Heart failure Father     Social History   Tobacco Use   Smoking status: Never   Smokeless tobacco: Never   Tobacco comments:    Never smoked  Vaping Use   Vaping Use: Never used  Substance Use Topics   Alcohol use: No   Drug use: No    Home Medications Prior to Admission medications   Medication Sig Start Date End Date Taking? Authorizing Provider  acetaminophen (TYLENOL) 500 MG tablet Take 1,000 mg by mouth every 8 (eight) hours as needed for moderate pain or headache.    [provider]  aspirin 81 MG chewable tablet Chew 1 tablet (81 mg total) by mouth daily. 01/20/20   Orson Eva, MD  clonazePAM (KLONOPIN) 1 MG tablet Take 1 mg by mouth 2 (two) times daily as needed. 07/28/20   [provider]  diltiazem (TIAZAC) 240 MG 24 hr capsule Take 1 capsule by mouth daily at 12 noon.    [provider]  dronabinol (MARINOL) 2.5 MG capsule TAKE 1 CAPSULE BY MOUTH TWICE DAILY BEFORE A MEAL 09/23/20   Erenest Rasher, PA-C  famotidine (PEPCID) 40 MG tablet Take 1 tablet (40 mg total) by mouth at bedtime. Patient taking differently:  Take 20 mg by mouth at bedtime. 09/11/20   Erenest Rasher, PA-C  ferrous sulfate 325 (65 FE) MG tablet Take 1 tablet (325 mg total) by mouth daily with breakfast. 01/20/20   Tat, Shanon Brow, MD  furosemide (LASIX) 40 MG tablet Take 1 tablet (40 mg total) by mouth daily. Patient not taking: Reported on 11/13/2020 01/20/20   Orson Eva, MD  lamoTRIgine (LAMICTAL) 200 MG tablet Take 200 mg by mouth 2 (two) times daily. 06/11/13   [provider]  lidocaine (LIDODERM) 5 % Place 2 patches onto the skin as needed. 09/05/20   [provider]  LINZESS 72 MCG capsule TAKE 1 CAPSULE(72 MCG) BY MOUTH DAILY BEFORE BREAKFAST Patient taking differently: 1 capsule every 3 days 09/29/20   Erenest Rasher, PA-C  LORazepam (ATIVAN) 1 MG tablet Take 1 mg by mouth 3 (three) times daily as needed for anxiety. 04/16/20   [provider]  losartan (COZAAR) 50 MG tablet Take 1 tablet by mouth at bedtime.    [provider]  lubiprostone (AMITIZA) 8 MCG capsule Take 1 capsule (8 mcg total) by mouth 2 (two) times daily with a meal. 11/13/20   Annitta Needs, NP  Melatonin 5 MG CAPS Take 10 mg by mouth at bedtime as needed (sleep). Patient not taking: Reported on 11/13/2020    [provider]  metoprolol succinate (TOPROL-XL) 50 MG 24 hr tablet Take 1 tablet (50 mg total) by mouth daily. Take with or immediately following a meal. 01/20/20   Tat, Shanon Brow, MD  OXcarbazepine (TRILEPTAL) 150 MG tablet Take 2 tablets by mouth daily. 07/28/20   [provider]  pantoprazole (PROTONIX) 40 MG tablet Take 1 tablet (40 mg total) by mouth daily. Take 30 minutes before breakfast 11/13/20   Annitta Needs, NP  potassium chloride 20 MEQ/15ML (10%) SOLN Take by mouth daily. 30ml's 02/12/20   [provider]  thiamine 100 MG tablet Take 1 tablet (100 mg total) by mouth daily. Patient not taking: Reported on 09/11/2020 11/13/19  Annitta Needs, NP  traMADol (ULTRAM) 50 MG tablet Take 50 mg by  mouth every 12 (twelve) hours as needed. 08/29/20   [provider]  triamcinolone cream (KENALOG) 0.1 % Apply 1 application topically 2 (two) times daily as needed (rash). Patient not taking: Reported on 11/13/2020    [provider]  vitamin B-12 (CYANOCOBALAMIN) 500 MCG tablet Take 1 tablet (500 mcg total) by mouth daily. 11/13/19   Annitta Needs, NP    Allergies    Lithium, Excedrin extra strength [aspirin-acetaminophen-caffeine], Geodon [ziprasidone hcl], and Klonopin [clonazepam]  Review of Systems   Review of Systems  Constitutional:  Negative for chills and fever.  HENT:  Negative for ear pain and sore throat.   Eyes:  Negative for pain and visual disturbance.  Respiratory:  Negative for cough and shortness of breath.   Cardiovascular:  Negative for chest pain and palpitations.  Gastrointestinal:  Negative for abdominal pain and vomiting.  Genitourinary:  Negative for dysuria and hematuria.  Musculoskeletal:  Positive for arthralgias and myalgias. Negative for back pain.  Skin:  Negative for color change and rash.  Neurological:  Positive for headaches. Negative for seizures and syncope.  Psychiatric/Behavioral:  The patient is nervous/anxious.   All other systems reviewed and are negative.  Physical Exam Updated Vital Signs BP (!) 143/66   Pulse 66   Temp 97.6 F (36.4 C) (Oral)   Resp 20   Ht 5\' 1"  (1.549 m)   Wt 65.8 kg   SpO2 100%   BMI 27.40 kg/m   Physical Exam Vitals reviewed.  Constitutional:      Appearance: Normal appearance.  HENT:     Head: Normocephalic and atraumatic.     Comments: No of hemotympanum, raccoon eyes, battle sign.  No mastoid tenderness.  No malocclusion.  No evidence of lacerations, cranial deformities. Full range of motion of head and neck    Eyes:     General:        Right eye: No discharge.        Left eye: No discharge.     Extraocular Movements: Extraocular movements intact.     Conjunctiva/sclera:  Conjunctivae normal.  Cardiovascular:     Rate and Rhythm: Normal rate and regular rhythm.  Pulmonary:     Effort: Pulmonary effort is normal.     Breath sounds: Normal breath sounds.  Abdominal:     General: Abdomen is flat. There is no distension.     Tenderness: There is no abdominal tenderness.  Musculoskeletal:        General: No swelling. Normal range of motion.     Cervical back: Normal range of motion. No tenderness.     Comments: Patient with hypersensitivity to with light touch in her back, arms, legs.  Cannot tell me if she has any midline tenderness of the C, T, L-spine.  Moving all 4 extremities, neurovascularly intact.  Skin:    General: Skin is warm and dry.  Neurological:     General: No focal deficit present.     Mental Status: She is alert and oriented to person, place, and time.     Comments: Mental Status:  Alert, thought content appropriate, able to give a coherent history. Speech fluent without evidence of aphasia. Able to follow 2 step commands without difficulty.  Cranial Nerves:  II:  Peripheral visual fields grossly normal, pupils equal, round, reactive to light III,IV, VI: ptosis not present, extra-ocular motions intact bilaterally  V,VII: smile symmetric, facial  light touch sensation equal VIII: hearing grossly normal to voice  X: uvula elevates symmetrically  XI: bilateral shoulder shrug symmetric and strong XII: midline tongue extension without fassiculations Motor:  Normal tone. 5/5 strength of BUE and BLE major muscle groups including strong and equal grip strength and dorsiflexion/plantar flexion Sensory: light touch normal in all extremities. Cerebellar: normal finger-to-nose with bilateral upper extremities, Romberg sign absent Gait:  not accessed    Psychiatric:        Mood and Affect: Mood is anxious.    ED Results / Procedures / Treatments   Labs (all labs ordered are listed, but only abnormal results are displayed) Labs Reviewed  CBC -  Abnormal; Notable for the following components:      Result Value   RBC 3.76 (*)    Hemoglobin 10.7 (*)    HCT 32.9 (*)    All other components within normal limits  BASIC METABOLIC PANEL - Abnormal; Notable for the following components:   Potassium 3.4 (*)    Glucose, Bld 213 (*)    Creatinine, Ser 1.39 (*)    GFR, Estimated 43 (*)    All other components within normal limits    EKG EKG Interpretation  Date/Time:  Friday February 20 2021 10:24:13 EDT Ventricular Rate:  74 PR Interval:  180 QRS Duration: 68 QT Interval:  425 QTC Calculation: 472 R Axis:   62 Text Interpretation: Sinus rhythm Probable left atrial enlargement Borderline T abnormalities, anterior leads No significant change since last tracing Confirmed by Calvert Cantor [id number]) on 02/20/2021 10:37:45 AM  Radiology No results found.  Procedures Procedures   Medications Ordered in ED Medications  fentaNYL (SUBLIMAZE) injection 25 mcg (25 mcg Intravenous Given 02/20/21 1102)  LORazepam (ATIVAN) tablet 1 mg (1 mg Oral Given 02/20/21 1056)    ED Course  I have reviewed the triage vital signs and the nursing notes.  Pertinent labs & imaging results that were available during my care of the patient were reviewed by me and considered in my medical decision making (see chart for details).    MDM Rules/Calculators/A&P                          65 year old female who presents to the ER after a fall.  On arrival, she is very anxious appearing, hypersensitive to touch, stating she "hurts all over".  She does complain of posterior head pain.  She is neurovascularly intact, no focal neurodeficits.  No visible trauma to the head.  She is able to follow two-step commands.  Vitals overall reassuring.  She is very anxious appearing, moaning, hyperventilating.  Physical exam of the spine difficult to assess, patient cannot tell me if she has any midline tenderness.  She is moving all 4 extremities without difficulty.  Equal  strength in upper and lower extremities.  Sensations intact.  Plain films of the lumbar spine, thoracic spine, chest and ribs overall reassuring.  CT of the head and neck without any acute findings.  EKG normal sinus rhythm.  BMP with slightly elevated creatinine from baseline, however does not meet AKI criteria.  Mild hypokalemia 3.4.  CBC with a stable hemoglobin.   Suspect mechanical fall. Daughter does endorse that she had frequent falls last year.  Low suspicion for ACS, PE, dissection, stroke as a cause of her fall.  No evidence of injury from the fall.  Patient was given fentanyl and Ativan, patient became much more calm, cooperative.  On  reevaluation, pain improved.  I discussed the findings with the patient's daughter.  She voiced understanding and is agreeable.  Encouraged PCP follow-up.  Return precautions discussed.  He voiced understanding and are agreeable.  Stable for discharge.   Final Clinical Impression(s) / ED Diagnoses Final diagnoses:  Cannot hear    Rx / DC Orders ED Discharge Orders     None        Lyndel Safe 02/20/21 1328    Truddie Hidden, MD 02/21/21 [phone]

## 2021-02-20 NOTE — Discharge Instructions (Addendum)
You were evaluated in the Emergency Department and after careful evaluation, we did not find any emergent condition requiring admission or further testing in the hospital.  Your imaging today was overall reassuring.  Please take Tylenol for pain.  You may experience some aches and pains after this fall.  I encourage you to follow-up with your primary care doctor.  Please return to the Emergency Department if you experience any worsening of your condition.  Thank you for allowing [place] to be a part of your care.

## 2021-02-26 DIAGNOSIS — N1831 Chronic kidney disease, stage 3a: Secondary | ICD-10-CM | POA: Diagnosis not present

## 2021-02-26 DIAGNOSIS — I10 Essential (primary) hypertension: Secondary | ICD-10-CM | POA: Diagnosis not present

## 2021-02-26 DIAGNOSIS — I5032 Chronic diastolic (congestive) heart failure: Secondary | ICD-10-CM | POA: Diagnosis not present

## 2021-02-26 DIAGNOSIS — Z79899 Other long term (current) drug therapy: Secondary | ICD-10-CM | POA: Diagnosis not present

## 2021-02-26 DIAGNOSIS — E1129 Type 2 diabetes mellitus with other diabetic kidney complication: Secondary | ICD-10-CM | POA: Diagnosis not present

## 2021-03-05 DIAGNOSIS — E1122 Type 2 diabetes mellitus with diabetic chronic kidney disease: Secondary | ICD-10-CM | POA: Diagnosis not present

## 2021-03-05 DIAGNOSIS — N1831 Chronic kidney disease, stage 3a: Secondary | ICD-10-CM | POA: Diagnosis not present

## 2021-03-05 DIAGNOSIS — I5032 Chronic diastolic (congestive) heart failure: Secondary | ICD-10-CM | POA: Diagnosis not present

## 2021-03-05 DIAGNOSIS — I10 Essential (primary) hypertension: Secondary | ICD-10-CM | POA: Diagnosis not present

## 2021-03-05 DIAGNOSIS — R7309 Other abnormal glucose: Secondary | ICD-10-CM | POA: Diagnosis not present

## 2021-03-24 ENCOUNTER — Encounter: Payer: Self-pay | Admitting: Gastroenterology

## 2021-03-24 ENCOUNTER — Other Ambulatory Visit: Payer: Self-pay

## 2021-03-24 ENCOUNTER — Ambulatory Visit (INDEPENDENT_AMBULATORY_CARE_PROVIDER_SITE_OTHER): Payer: Medicare Other | Admitting: Gastroenterology

## 2021-03-24 VITALS — BP 138/79 | HR 65 | Temp 97.7°F | Ht 61.0 in | Wt 143.2 lb

## 2021-03-24 DIAGNOSIS — K5900 Constipation, unspecified: Secondary | ICD-10-CM | POA: Diagnosis not present

## 2021-03-24 DIAGNOSIS — K219 Gastro-esophageal reflux disease without esophagitis: Secondary | ICD-10-CM | POA: Diagnosis not present

## 2021-03-24 DIAGNOSIS — D649 Anemia, unspecified: Secondary | ICD-10-CM | POA: Diagnosis not present

## 2021-03-24 MED ORDER — LUBIPROSTONE 24 MCG PO CAPS: 24.0000 ug | ORAL_CAPSULE | Freq: Two times a day (BID) | ORAL | 3 refills | Status: DC

## 2021-03-24 NOTE — Patient Instructions (Signed)
Let's stop the lower dosage of Amitiza. I have sent in Amitiza 24 micrograms to take starting out once a day with food. If needed, we can increase to twice a day.  Please have blood work done in 6-8 weeks.  We will see you in 3-4 months!

## 2021-03-24 NOTE — Progress Notes (Signed)
Referring Provider: [name], MD Primary Care Physician:  [name], MD Primary GI: Dr. Gala Romney   Chief Complaint  Patient presents with   Constipation    Sometimes med doesn't help. Taking Miralax prn too   Gastroesophageal Reflux    HPI:   Linda English is a 65 y.o. 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 Evaluated at [facility] with recommendations to continue Marinol, which helped significantly with nausea.GES normal.    Constipation: difficult to manage historically. Linzess 72 mcg daily caused diarrhea. Started on Amitiza 8 mcg with food at last visit. Not ideally managed. Could use stronger dose.   GERD: pantoprazole working well.   Nausea: no longer taking Marinol. Good appetite. Gaining weight.   Recent hgb 10.7, previously 13 in Dec 2021.   Past Medical History:  Diagnosis Date   Acid reflux    Allergic rhinitis    Anxiety    Arthritis    osteoarthritis   Bipolar disorder (HCC)    Chronic headache    Depression    Diverticula of colon    DM (diabetes mellitus) (HCC)    Gastric erosions    Gastric polyps    benign    Hemorrhoids    Hiatal hernia    Hypertension    IBS (irritable bowel syndrome)    Lichen planus    Obstructive sleep apnea    Panic disorder    Parkinson's disease ([place])    Rectal cancer ([place]) 2003   ileostomy and reversal   Subdural hematoma ([place])    Tubular adenoma     Past Surgical History:  Procedure Laterality Date   ABDOMINAL HYSTERECTOMY     APPENDECTOMY     BACK SURGERY     BIOPSY  10/01/2019   Procedure: BIOPSY;  Surgeon: Daneil Dolin, MD;  [facility];  Service: Endoscopy;;  gastric   CESAREAN SECTION     X  2   CHOLECYSTECTOMY     COLONOSCOPY  08/2007   DR Gala Romney, friable anal canal hemorrhoids, surgical anastomosis at 3cm, distal scattered tics   COLONOSCOPY  12/15/2010   anal papilla and internal hemorrhoids/diminutive polyp in the base of the cecum. Past, tubular adenoma.. Next colonoscopy due in April 2017.   COLONOSCOPY  10/27/2005   RMR: Anal canal hemorrhoids. Surgical anastomosis at 3 cm from the anal verge appeared normal. Few scattered distal diverticula. The residual  colonic mucosa appeared normal. I suspect the patient bled from hemorrhoids   COLONOSCOPY N/A 01/15/2015   [id number] residual rectum and colon. next tcs 12/2019   COLONOSCOPY WITH PROPOFOL N/A 05/01/2020   Procedure: COLONOSCOPY WITH PROPOFOL;  Surgeon: Daneil Dolin, MD;  Two 3-8 millimeters polyps in ascending colon, diverticulosis in descending colon, surgical anastomosis at 4 cm, otherwise normal exam.  Pathology with tubular adenomas.  Repeat colonoscopy in 5 years.   ESOPHAGEAL DILATION N/A 06/27/2015   Procedure: ESOPHAGEAL DILATION;  Surgeon: Daneil Dolin, MD;  [facility];  Service: Endoscopy;  Laterality: N/A;   ESOPHAGOGASTRODUODENOSCOPY  05/2002   DR Gala Romney, normal   ESOPHAGOGASTRODUODENOSCOPY  08/03/2011   RMR: small HH/ gastric polyps   ESOPHAGOGASTRODUODENOSCOPY N/A  06/27/2015   Dr. Gala Romney: Mild erosive reflux esophagitits. Status post passage of a Maloney dilator. Hiatal hernia. Gastric polyps and abnormal gastric mucosa of doubtful clinical significane. status post biopsy, benign fundic gland polyp, negative H.pylori   ESOPHAGOGASTRODUODENOSCOPY (EGD) WITH PROPOFOL N/A 10/01/2019   rourk: Normal esophagus status post esophageal dilation for dysphagia, erythematous mucosa in the stomach biopsy consistent with reactive gastropathy, no H. pylori, multiple gastric polyps (biopsy fundic gland)   low anterior rection  2003   RECTAL  CANCER   MALONEY DILATION N/A 10/01/2019   Procedure: MALONEY DILATION;  Surgeon: Daneil Dolin, MD;  [facility];  Service: Endoscopy;  Laterality: N/A;   POLYPECTOMY  05/01/2020   Procedure: POLYPECTOMY;  Surgeon: Daneil Dolin, MD;  [facility];  Service: Endoscopy;;   SPINE SURGERY  2001   L5,S1 HEMILAMINOTOMY AND DISCECTOMY/DR DEATON    Current Outpatient Medications  Medication Sig Dispense Refill   acetaminophen (TYLENOL) 500 MG tablet Take 1,000 mg by mouth every 8 (eight) hours as needed for moderate pain or headache.     diltiazem (TIAZAC) 240 MG 24 hr capsule Take 1 capsule by mouth daily at 12 noon.     dronabinol (MARINOL) 2.5 MG capsule TAKE 1 CAPSULE BY MOUTH TWICE DAILY BEFORE A MEAL 60 capsule 3   famotidine (PEPCID) 40 MG tablet Take 1 tablet (40 mg total) by mouth at bedtime. 30 tablet 5   furosemide (LASIX) 40 MG tablet Take 1 tablet (40 mg total) by mouth daily. 30 tablet 1   lamoTRIgine (LAMICTAL) 200 MG tablet Take 200 mg by mouth 2 (two) times daily.     lidocaine (LIDODERM) 5 % Place 2 patches onto the skin as needed.     LORazepam (ATIVAN) 1 MG tablet Take 1 mg by mouth 3 (three) times daily as needed for anxiety.     losartan (COZAAR) 50 MG tablet Take 1 tablet by mouth at bedtime.     lubiprostone (AMITIZA) 8 MCG capsule Take 1 capsule (8 mcg total) by mouth 2 (two) times daily with a meal. 60 capsule 3   Melatonin 5 MG CAPS Take 10 mg by mouth at bedtime as needed (sleep).     metoprolol succinate (TOPROL-XL) 50 MG 24 hr tablet Take 1 tablet (50 mg total) by mouth daily. Take with or immediately following a meal. 30 tablet 1   OXcarbazepine (TRILEPTAL) 150 MG tablet Take 150 mg by mouth daily.     pantoprazole (PROTONIX) 40 MG tablet Take 1 tablet (40 mg total) by mouth daily. Take 30 minutes before breakfast 90 tablet 3   polyethylene glycol (MIRALAX / GLYCOLAX) 17 g packet Take 17 g by mouth daily as needed.     potassium chloride 20  MEQ/15ML (10%) SOLN Take by mouth daily. 42m's     traMADol (ULTRAM) 50 MG tablet Take 50 mg by mouth every 12 (twelve) hours as needed.     triamcinolone cream (KENALOG) 0.1 % Apply 1 application topically 2 (two) times daily as needed (rash).     aspirin 81 MG chewable tablet Chew 1 tablet (81 mg total) by mouth daily. (Patient not taking: Reported on 03/24/2021)     clonazePAM (KLONOPIN) 1 MG tablet Take 1 mg by mouth 2 (two) times daily as needed. (Patient not taking: Reported on 03/24/2021)     ferrous sulfate 325 (65 FE) MG tablet Take 1 tablet (325 mg total) by mouth daily with breakfast. (Patient not taking: Reported on  03/24/2021)  3   LINZESS 72 MCG capsule TAKE 1 CAPSULE(72 MCG) BY MOUTH DAILY BEFORE BREAKFAST (Patient not taking: Reported on 03/24/2021) 30 capsule 5   thiamine 100 MG tablet Take 1 tablet (100 mg total) by mouth daily. (Patient not taking: No sig reported) 30 tablet 5   vitamin B-12 (CYANOCOBALAMIN) 500 MCG tablet Take 1 tablet (500 mcg total) by mouth daily. (Patient not taking: Reported on 03/24/2021) 30 tablet 5   No current facility-administered medications for this visit.    Allergies as of 03/24/2021 - Review Complete 03/24/2021  Allergen Reaction Noted   Lithium Nausea And Vomiting 06/27/2015   Excedrin extra strength [aspirin-acetaminophen-caffeine] Other (See Comments) 10/28/2014   Geodon [ziprasidone hcl]  05/01/2020   Klonopin [clonazepam] Other (See Comments) 10/23/2019    Family History  Problem Relation Age of Onset   Colon cancer Paternal Grandfather 56   Colon polyps Brother    Colon polyps Cousin    Diabetes Mother    Hypertension Mother    Hypertension Father    Heart attack Father    Heart failure Father     Social History   Socioeconomic History   Marital status: Divorced    Spouse name: Not on file   Number of children: Not on file   Years of education: Not on file   Highest education level: Not on file  Occupational History    Occupation: Product manager: RETIRED    Comment: [employer]  Tobacco Use   Smoking status: Never   Smokeless tobacco: Never   Tobacco comments:    Never smoked  Vaping Use   Vaping Use: Never used  Substance and Sexual Activity   Alcohol use: No   Drug use: No   Sexual activity: Never  Other Topics Concern   Not on file  Social History Narrative   Not on file   Social Determinants of Health   Financial Resource Strain: Not on file  Food Insecurity: Not on file  Transportation Needs: Not on file  Physical Activity: Not on file  Stress: Not on file  Social Connections: Not on file    Review of Systems: Gen: Denies fever, chills, anorexia. Denies fatigue, weakness, weight loss.  CV: Denies chest pain, palpitations, syncope, peripheral edema, and claudication. Resp: Denies dyspnea at rest, cough, wheezing, coughing up blood, and pleurisy. GI: see HPI Derm: Denies rash, itching, dry skin Psych: Denies depression, anxiety, memory loss, confusion. No homicidal or suicidal ideation.  Heme: Denies bruising, bleeding, and enlarged lymph nodes.  Physical Exam: BP 138/79   Pulse 65   Temp 97.7 F (36.5 C) (Temporal)   Ht '5\' 1"'$  (1.549 m)   Wt 143 lb 3.2 oz (65 kg)   BMI 27.06 kg/m  General:   Alert and oriented. No distress noted. Pleasant and cooperative.  Head:  Normocephalic and atraumatic. Eyes:  Conjuctiva clear without scleral icterus. Mouth:  mask in place Abdomen:  +BS, soft, non-tender and non-distended. No rebound or guarding. No HSM or masses noted. Msk:  Symmetrical without gross deformities. Normal posture. Extremities:  Without edema. Neurologic:  Alert and  oriented x4 Psych:  Alert and cooperative. Normal mood and affect.  ASSESSMENT/PLAN: Linda English is a 65 y.o. 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 Evaluated at [facility] with recommendations to continue Marinol, which helped significantly with nausea.GES normal.    Constipation: not ideally managed now with Amitiza 8 mcg. Will increase to 24 mcg starting out once daily and increase to BID if tolerated.   GERD: Continue pantoprazole daily. Nausea resolved and no longer needs marinol.  Normocytic anemia with Hgb 10.7 recently, previously 13 in Dec 2021. Will check CBC and iron studies in 6 weeks.  Return in 3-4 months.   Annitta Needs, PhD, ANP-BC [facility]

## 2021-03-25 ENCOUNTER — Other Ambulatory Visit: Payer: Self-pay | Admitting: Gastroenterology

## 2021-03-25 DIAGNOSIS — K219 Gastro-esophageal reflux disease without esophagitis: Secondary | ICD-10-CM

## 2021-04-02 DIAGNOSIS — D649 Anemia, unspecified: Secondary | ICD-10-CM | POA: Diagnosis not present

## 2021-04-03 LAB — CBC WITH DIFFERENTIAL/PLATELET
Absolute Monocytes: 578 cells/uL (ref 200–950)
Basophils Absolute: 39 cells/uL (ref 0–200)
Basophils Relative: 0.8 %
Eosinophils Absolute: 118 cells/uL (ref 15–500)
Eosinophils Relative: 2.4 %
HCT: 40 % (ref 35.0–45.0)
Hemoglobin: 13 g/dL (ref 11.7–15.5)
Lymphs Abs: 1446 cells/uL (ref 850–3900)
MCH: 27.5 pg (ref 27.0–33.0)
MCHC: 32.5 g/dL (ref 32.0–36.0)
MCV: 84.7 fL (ref 80.0–100.0)
MPV: 9.9 fL (ref 7.5–12.5)
Monocytes Relative: 11.8 %
Neutro Abs: 2720 cells/uL (ref 1500–7800)
Neutrophils Relative %: 55.5 %
Platelets: 290 10*3/uL (ref 140–400)
RBC: 4.72 10*6/uL (ref 3.80–5.10)
RDW: 13.1 % (ref 11.0–15.0)
Total Lymphocyte: 29.5 %
WBC: 4.9 10*3/uL (ref 3.8–10.8)

## 2021-04-03 LAB — IRON,TIBC AND FERRITIN PANEL
%SAT: 28 % (calc) (ref 16–45)
Ferritin: 126 ng/mL (ref 16–288)
Iron: 79 ug/dL (ref 45–160)
TIBC: 281 mcg/dL (calc) (ref 250–450)

## 2021-04-22 DIAGNOSIS — R296 Repeated falls: Secondary | ICD-10-CM | POA: Diagnosis not present

## 2021-04-22 DIAGNOSIS — R42 Dizziness and giddiness: Secondary | ICD-10-CM | POA: Diagnosis not present

## 2021-04-22 DIAGNOSIS — R278 Other lack of coordination: Secondary | ICD-10-CM | POA: Diagnosis not present

## 2021-04-22 DIAGNOSIS — E1143 Type 2 diabetes mellitus with diabetic autonomic (poly)neuropathy: Secondary | ICD-10-CM | POA: Diagnosis not present

## 2021-04-22 DIAGNOSIS — K3184 Gastroparesis: Secondary | ICD-10-CM | POA: Diagnosis not present

## 2021-04-22 DIAGNOSIS — R55 Syncope and collapse: Secondary | ICD-10-CM | POA: Diagnosis not present

## 2021-04-22 DIAGNOSIS — I951 Orthostatic hypotension: Secondary | ICD-10-CM | POA: Diagnosis not present

## 2021-05-29 DIAGNOSIS — E1129 Type 2 diabetes mellitus with other diabetic kidney complication: Secondary | ICD-10-CM | POA: Diagnosis not present

## 2021-05-29 DIAGNOSIS — Z79899 Other long term (current) drug therapy: Secondary | ICD-10-CM | POA: Diagnosis not present

## 2021-05-29 DIAGNOSIS — N1831 Chronic kidney disease, stage 3a: Secondary | ICD-10-CM | POA: Diagnosis not present

## 2021-05-29 DIAGNOSIS — I10 Essential (primary) hypertension: Secondary | ICD-10-CM | POA: Diagnosis not present

## 2021-06-05 DIAGNOSIS — N1831 Chronic kidney disease, stage 3a: Secondary | ICD-10-CM | POA: Diagnosis not present

## 2021-06-05 DIAGNOSIS — R7309 Other abnormal glucose: Secondary | ICD-10-CM | POA: Diagnosis not present

## 2021-06-05 DIAGNOSIS — E1122 Type 2 diabetes mellitus with diabetic chronic kidney disease: Secondary | ICD-10-CM | POA: Diagnosis not present

## 2021-06-05 DIAGNOSIS — I5032 Chronic diastolic (congestive) heart failure: Secondary | ICD-10-CM | POA: Diagnosis not present

## 2021-06-09 DIAGNOSIS — E1129 Type 2 diabetes mellitus with other diabetic kidney complication: Secondary | ICD-10-CM | POA: Diagnosis not present

## 2021-06-09 DIAGNOSIS — N1831 Chronic kidney disease, stage 3a: Secondary | ICD-10-CM | POA: Diagnosis not present

## 2021-06-17 DIAGNOSIS — R42 Dizziness and giddiness: Secondary | ICD-10-CM | POA: Diagnosis not present

## 2021-06-17 DIAGNOSIS — R55 Syncope and collapse: Secondary | ICD-10-CM | POA: Diagnosis not present

## 2021-06-17 DIAGNOSIS — K3184 Gastroparesis: Secondary | ICD-10-CM | POA: Diagnosis not present

## 2021-06-17 DIAGNOSIS — R296 Repeated falls: Secondary | ICD-10-CM | POA: Diagnosis not present

## 2021-06-17 DIAGNOSIS — R278 Other lack of coordination: Secondary | ICD-10-CM | POA: Diagnosis not present

## 2021-06-17 DIAGNOSIS — E1143 Type 2 diabetes mellitus with diabetic autonomic (poly)neuropathy: Secondary | ICD-10-CM | POA: Diagnosis not present

## 2021-06-17 DIAGNOSIS — I951 Orthostatic hypotension: Secondary | ICD-10-CM | POA: Diagnosis not present

## 2021-07-15 DIAGNOSIS — L820 Inflamed seborrheic keratosis: Secondary | ICD-10-CM | POA: Diagnosis not present

## 2021-07-28 ENCOUNTER — Other Ambulatory Visit: Payer: Self-pay

## 2021-07-28 ENCOUNTER — Ambulatory Visit (INDEPENDENT_AMBULATORY_CARE_PROVIDER_SITE_OTHER): Payer: Medicare Other | Admitting: Gastroenterology

## 2021-07-28 ENCOUNTER — Encounter: Payer: Self-pay | Admitting: Gastroenterology

## 2021-07-28 VITALS — BP 150/78 | HR 60 | Temp 97.1°F | Ht 61.0 in | Wt 148.6 lb

## 2021-07-28 DIAGNOSIS — K219 Gastro-esophageal reflux disease without esophagitis: Secondary | ICD-10-CM

## 2021-07-28 DIAGNOSIS — K5900 Constipation, unspecified: Secondary | ICD-10-CM

## 2021-07-28 MED ORDER — LUBIPROSTONE 8 MCG PO CAPS: 8.0000 ug | ORAL_CAPSULE | Freq: Two times a day (BID) | ORAL | 3 refills | Status: DC

## 2021-07-28 NOTE — Progress Notes (Signed)
Referring Provider: [name], MD Primary Care Physician:  [name], MD Primary GI: Dr. Gala Romney   Chief Complaint  Patient presents with   Constipation    Can go several days without bm but isn't constipated. Has lower back pain. Scared to take Amitiza on regular basis d/t causes loose stool   Gastroesophageal Reflux    occ    HPI:   Linda English is a 65 y.o. 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 Evaluated at [facility] with recommendations to continue Marinol, which helped significantly with nausea.GES normal.  Colonoscopy due in 2026.    Constipation: difficult to manage historically. Linzess 72 mcg daily caused diarrhea. Recently increased Amitiza in July to 24 mcg. Amitiza 24 mcg just as needed as it's strong when takes it. Was on 8 mcg. Causes diarrhea.    GERD: occasional breakthrough. Protonix once daily. Overall improved.    Past Medical History:  Diagnosis Date   Acid reflux    Allergic rhinitis    Anxiety    Arthritis    osteoarthritis   Bipolar disorder (HCC)    Chronic headache    Depression    Diverticula of colon    DM (diabetes mellitus) (HCC)    Gastric erosions    Gastric polyps    benign    Hemorrhoids    Hiatal hernia    Hypertension    IBS (irritable bowel syndrome)    Lichen planus    Obstructive sleep apnea    Panic disorder    Parkinson's disease ([place])    Rectal cancer ([place]) 2003   ileostomy and reversal   Subdural hematoma    Tubular adenoma     Past Surgical History:  Procedure Laterality Date   ABDOMINAL HYSTERECTOMY     APPENDECTOMY     BACK SURGERY      BIOPSY  10/01/2019   Procedure: BIOPSY;  Surgeon: Daneil Dolin, MD;  [facility];  Service: Endoscopy;;  gastric   CESAREAN SECTION     X  2   CHOLECYSTECTOMY     COLONOSCOPY  08/2007   DR Gala Romney, friable anal canal hemorrhoids, surgical anastomosis at 3cm, distal scattered tics   COLONOSCOPY  12/15/2010   anal papilla and internal hemorrhoids/diminutive polyp in the base of the cecum. Past, tubular adenoma.. Next colonoscopy due in April 2017.   COLONOSCOPY  10/27/2005   RMR: Anal canal hemorrhoids. Surgical anastomosis at 3 cm from the anal verge appeared normal. Few scattered distal diverticula. The residual  colonic mucosa appeared normal. I suspect the patient bled from hemorrhoids   COLONOSCOPY N/A 01/15/2015   [id number] residual rectum and colon. next tcs 12/2019   COLONOSCOPY WITH PROPOFOL N/A 05/01/2020   Procedure: COLONOSCOPY WITH PROPOFOL;  Surgeon: Daneil Dolin, MD;  Two 3-8 millimeters polyps in ascending colon, diverticulosis in descending colon, surgical anastomosis at 4 cm, otherwise normal exam.  Pathology with tubular adenomas.  Repeat colonoscopy in 5 years.   ESOPHAGEAL DILATION N/A 06/27/2015   Procedure: ESOPHAGEAL DILATION;  Surgeon: Daneil Dolin, MD;  [facility];  Service: Endoscopy;  Laterality: N/A;   ESOPHAGOGASTRODUODENOSCOPY  05/2002   DR  ROURK, normal   ESOPHAGOGASTRODUODENOSCOPY  08/03/2011   RMR: small HH/ gastric polyps   ESOPHAGOGASTRODUODENOSCOPY N/A 06/27/2015   Dr. Gala Romney: Mild erosive reflux esophagitits. Status post passage of a Maloney dilator. Hiatal hernia. Gastric polyps and abnormal gastric mucosa of doubtful clinical significane. status post biopsy, benign fundic gland polyp, negative H.pylori   ESOPHAGOGASTRODUODENOSCOPY (EGD) WITH PROPOFOL N/A 10/01/2019   rourk: Normal esophagus status post esophageal dilation for dysphagia, erythematous mucosa in the stomach biopsy consistent with reactive gastropathy, no H. pylori,  multiple gastric polyps (biopsy fundic gland)   low anterior rection  2003   RECTAL CANCER   MALONEY DILATION N/A 10/01/2019   Procedure: MALONEY DILATION;  Surgeon: Daneil Dolin, MD;  [facility];  Service: Endoscopy;  Laterality: N/A;   POLYPECTOMY  05/01/2020   Procedure: POLYPECTOMY;  Surgeon: Daneil Dolin, MD;  [facility];  Service: Endoscopy;;   SPINE SURGERY  2001   L5,S1 HEMILAMINOTOMY AND DISCECTOMY/DR DEATON    Current Outpatient Medications  Medication Sig Dispense Refill   acetaminophen (TYLENOL) 500 MG tablet Take 1,000 mg by mouth every 8 (eight) hours as needed for moderate pain or headache.     carbamazepine (TEGRETOL) 200 MG tablet Take 200 mg by mouth at bedtime.     diltiazem (TIAZAC) 240 MG 24 hr capsule Take 1 capsule by mouth daily at 12 noon.     famotidine (PEPCID) 40 MG tablet TAKE 1 TABLET(40 MG) BY MOUTH AT BEDTIME 30 tablet 5   furosemide (LASIX) 40 MG tablet Take 1 tablet (40 mg total) by mouth daily. 30 tablet 1   lamoTRIgine (LAMICTAL) 200 MG tablet Take 200 mg by mouth 2 (two) times daily.     lidocaine (LIDODERM) 5 % Place 2 patches onto the skin as needed.     LORazepam (ATIVAN) 1 MG tablet Take 1 mg by mouth 3 (three) times daily as needed for anxiety.     losartan (COZAAR) 50 MG tablet Take 1 tablet by mouth at bedtime.     lubiprostone (AMITIZA) 8 MCG capsule Take 1 capsule (8 mcg total) by mouth 2 (two) times daily with a meal. 60 capsule 3   metoprolol succinate (TOPROL-XL) 50 MG 24 hr tablet Take 1 tablet (50 mg total) by mouth daily. Take with or immediately following a meal. 30 tablet 1   mupirocin ointment (BACTROBAN) 2 % Apply 1 application topically as needed.     pantoprazole (PROTONIX) 40 MG tablet Take 1 tablet (40 mg total) by mouth daily. Take 30 minutes before breakfast 90 tablet 3   polyethylene glycol (MIRALAX / GLYCOLAX) 17 g packet Take 17 g by mouth daily as needed.     traMADol (ULTRAM) 50 MG tablet Take 50  mg by mouth every 12 (twelve) hours as needed.     triamcinolone cream (KENALOG) 0.1 % Apply 1 application topically 2 (two) times daily as needed (rash).     No current facility-administered medications for this visit.    Allergies as of 07/28/2021 - Review Complete 07/28/2021  Allergen Reaction Noted   Lithium Nausea And Vomiting 06/27/2015   Excedrin extra strength [aspirin-acetaminophen-caffeine] Other (See Comments) 10/28/2014   Geodon [ziprasidone hcl]  05/01/2020   Klonopin [clonazepam] Other (See Comments) 10/23/2019    Family History  Problem Relation Age of Onset   Colon cancer Paternal Grandfather 44   Colon polyps Brother    Colon polyps Cousin    Diabetes Mother    Hypertension Mother  Hypertension Father    Heart attack Father    Heart failure Father     Social History   Socioeconomic History   Marital status: Divorced    Spouse name: Not on file   Number of children: Not on file   Years of education: Not on file   Highest education level: Not on file  Occupational History   Occupation: Product manager: RETIRED    Comment: [place]  Tobacco Use   Smoking status: Never   Smokeless tobacco: Never   Tobacco comments:    Never smoked  Vaping Use   Vaping Use: Never used  Substance and Sexual Activity   Alcohol use: No   Drug use: No   Sexual activity: Never  Other Topics Concern   Not on file  Social History Narrative   Not on file   Social Determinants of Health   Financial Resource Strain: Not on file  Food Insecurity: Not on file  Transportation English: Not on file  Physical Activity: Not on file  Stress: Not on file  Social Connections: Not on file    Review of Systems: Gen: Denies fever, chills, anorexia. Denies fatigue, weakness, weight loss.  CV: Denies chest pain, palpitations, syncope, peripheral edema, and claudication. Resp: Denies dyspnea at rest, cough, wheezing, coughing up blood, and pleurisy. GI: see HPI Derm:  Denies rash, itching, dry skin Psych: Denies depression, anxiety, memory loss, confusion. No homicidal or suicidal ideation.  Heme: Denies bruising, bleeding, and enlarged lymph nodes.  Physical Exam: BP (!) 150/78   Pulse 60   Temp (!) 97.1 F (36.2 C) (Temporal)   Ht 5\' 1"  (1.549 m)   Wt 148 lb 9.6 oz (67.4 kg)   BMI 28.08 kg/m  General:   Alert and oriented. No distress noted. Pleasant and cooperative.  Head:  Normocephalic and atraumatic. Eyes:  Conjuctiva clear without scleral icterus. Mouth:  Oral mucosa pink and moist. Good dentition. No lesions. Abdomen:  +BS, soft, non-tender and non-distended. No rebound or guarding. No HSM or masses noted. Msk:  Symmetrical without gross deformities. Normal posture. Extremities:  Without edema. Neurologic:  Alert and  oriented x4 Psych:  Alert and cooperative. Normal mood and affect.  ASSESSMENT: LATAJAH THUMAN is a 65 y.o. female presenting today with a complicated GI history as noted above in follow-up for constipation and GERD.  GERD well-managed with Protonix daily. No alarm signs/symptoms.  Constipation: will titrate Amitiza down to 8 mcg orally to BID. 24 mcg has overshot the mark.   Colonoscopy due in 2026 due to history of rectal cancer.  Overall, she is doing quite well today.    PLAN:  Continue PPI daily  Amitiza 8 mcg daily to BID  Return in 6 months  Linda Needs, PhD, [facility]

## 2021-07-28 NOTE — Patient Instructions (Signed)
Let's stop Amitiza 24 micrograms. Instead, take Amitiza 8 micrograms once daily to start off with food. If needed, you can take this twice a day with food.  We will see you back in 6 months!  Let me know how this works for you!  I enjoyed seeing you again today! As you know, I value our relationship and want to provide genuine, compassionate, and quality care. I welcome your feedback. If you receive a survey regarding your visit,  I greatly appreciate you taking time to fill this out. See you next time!  Annitta Needs, PhD, ANP-BC [facility]

## 2021-07-30 DIAGNOSIS — L430 Hypertrophic lichen planus: Secondary | ICD-10-CM | POA: Diagnosis not present

## 2021-08-03 ENCOUNTER — Telehealth: Payer: Self-pay | Admitting: Internal Medicine

## 2021-08-03 NOTE — Telephone Encounter (Signed)
Returned the pt's call went straight to vm, LMOVM for her to return call

## 2021-08-03 NOTE — Telephone Encounter (Signed)
FYI: I phoned and spoke with the pt and advised the pt of your advise. Pt stated nothing had really changed its just she thought maybe she could get something cheaper (but it works). She really didn't want to try the Trulance because of you not knowing how it would work for her. The pt stated she will stick with the Amitiza for know. I advised her x 2 if she changes her mind and want to try it call me back before end of the week. Pt expressed understanding of this.

## 2021-08-03 NOTE — Telephone Encounter (Signed)
Noted  

## 2021-08-03 NOTE — Telephone Encounter (Signed)
Please call patient about her prescription, she needs something cheaper

## 2021-08-03 NOTE — Telephone Encounter (Signed)
She has been on Amitiza for awhile, so I don't know what changed? I only changed the dosage strength. Linzess caused diarrhea. I am not sure how Trulance would work for her, but we could try samples of that once daily.

## 2021-08-03 NOTE — Telephone Encounter (Signed)
Pt returned call and advised me that her Amitiza is $47.00 and that it is too much for her pay. I advised the pt that was cheap compared to most but she would like to see what else can she get or can you recommend something else

## 2021-08-05 ENCOUNTER — Telehealth: Payer: Self-pay | Admitting: Internal Medicine

## 2021-08-05 NOTE — Telephone Encounter (Signed)
PLEASE CALL THE PATIENT, SHE SAID THAT ANNA WAS SUPPOSED TO CHANGE HER MEDICATION BUT SHE IS STILL ON THE SAME DOSE AS BEFORE

## 2021-08-05 NOTE — Telephone Encounter (Signed)
Informed pt that Amitiza 8 mcg was sent in to pharmacy on the 29th. Pt verbalized understanding.

## 2021-08-11 DIAGNOSIS — Z1231 Encounter for screening mammogram for malignant neoplasm of breast: Secondary | ICD-10-CM | POA: Diagnosis not present

## 2021-09-01 DIAGNOSIS — L438 Other lichen planus: Secondary | ICD-10-CM | POA: Diagnosis not present

## 2021-09-06 ENCOUNTER — Other Ambulatory Visit: Payer: Self-pay | Admitting: Gastroenterology

## 2021-09-06 DIAGNOSIS — K219 Gastro-esophageal reflux disease without esophagitis: Secondary | ICD-10-CM

## 2021-09-21 DIAGNOSIS — R55 Syncope and collapse: Secondary | ICD-10-CM | POA: Diagnosis not present

## 2021-09-21 DIAGNOSIS — E1143 Type 2 diabetes mellitus with diabetic autonomic (poly)neuropathy: Secondary | ICD-10-CM | POA: Diagnosis not present

## 2021-10-27 DIAGNOSIS — K219 Gastro-esophageal reflux disease without esophagitis: Secondary | ICD-10-CM | POA: Diagnosis not present

## 2021-10-27 DIAGNOSIS — E1129 Type 2 diabetes mellitus with other diabetic kidney complication: Secondary | ICD-10-CM | POA: Diagnosis not present

## 2021-10-27 DIAGNOSIS — I10 Essential (primary) hypertension: Secondary | ICD-10-CM | POA: Diagnosis not present

## 2021-12-03 DIAGNOSIS — E1129 Type 2 diabetes mellitus with other diabetic kidney complication: Secondary | ICD-10-CM | POA: Diagnosis not present

## 2021-12-03 DIAGNOSIS — Z79899 Other long term (current) drug therapy: Secondary | ICD-10-CM | POA: Diagnosis not present

## 2021-12-03 DIAGNOSIS — E785 Hyperlipidemia, unspecified: Secondary | ICD-10-CM | POA: Diagnosis not present

## 2021-12-03 DIAGNOSIS — I5032 Chronic diastolic (congestive) heart failure: Secondary | ICD-10-CM | POA: Diagnosis not present

## 2021-12-03 DIAGNOSIS — N1831 Chronic kidney disease, stage 3a: Secondary | ICD-10-CM | POA: Diagnosis not present

## 2021-12-14 DIAGNOSIS — R296 Repeated falls: Secondary | ICD-10-CM | POA: Diagnosis not present

## 2021-12-14 DIAGNOSIS — I952 Hypotension due to drugs: Secondary | ICD-10-CM | POA: Diagnosis not present

## 2021-12-14 DIAGNOSIS — Z79899 Other long term (current) drug therapy: Secondary | ICD-10-CM | POA: Diagnosis not present

## 2021-12-14 DIAGNOSIS — E1143 Type 2 diabetes mellitus with diabetic autonomic (poly)neuropathy: Secondary | ICD-10-CM | POA: Diagnosis not present

## 2021-12-14 DIAGNOSIS — K3184 Gastroparesis: Secondary | ICD-10-CM | POA: Diagnosis not present

## 2021-12-14 DIAGNOSIS — R42 Dizziness and giddiness: Secondary | ICD-10-CM | POA: Diagnosis not present

## 2021-12-14 DIAGNOSIS — G2111 Neuroleptic induced parkinsonism: Secondary | ICD-10-CM | POA: Diagnosis not present

## 2021-12-15 DIAGNOSIS — E785 Hyperlipidemia, unspecified: Secondary | ICD-10-CM | POA: Diagnosis not present

## 2021-12-15 DIAGNOSIS — N1832 Chronic kidney disease, stage 3b: Secondary | ICD-10-CM | POA: Diagnosis not present

## 2021-12-15 DIAGNOSIS — I5032 Chronic diastolic (congestive) heart failure: Secondary | ICD-10-CM | POA: Diagnosis not present

## 2021-12-15 DIAGNOSIS — E1122 Type 2 diabetes mellitus with diabetic chronic kidney disease: Secondary | ICD-10-CM | POA: Diagnosis not present

## 2021-12-15 DIAGNOSIS — I10 Essential (primary) hypertension: Secondary | ICD-10-CM | POA: Diagnosis not present

## 2021-12-15 DIAGNOSIS — R7309 Other abnormal glucose: Secondary | ICD-10-CM | POA: Diagnosis not present

## 2022-01-19 ENCOUNTER — Telehealth: Payer: Self-pay | Admitting: *Deleted

## 2022-01-19 MED ORDER — PANTOPRAZOLE SODIUM 40 MG PO TBEC: 40.0000 mg | DELAYED_RELEASE_TABLET | Freq: Every day | ORAL | 3 refills | Status: DC

## 2022-01-19 NOTE — Addendum Note (Signed)
Addended by: Annitta Needs on: 01/19/2022 08:28 AM   Modules accepted: Orders

## 2022-01-19 NOTE — Telephone Encounter (Signed)
Pt last seen 07/28/2021.  Received fax from [facility] [employer] [facility].  Refill request for Pantoprazole '40mg'$  daily.

## 2022-01-19 NOTE — Telephone Encounter (Signed)
Completed.

## 2022-01-26 ENCOUNTER — Ambulatory Visit (INDEPENDENT_AMBULATORY_CARE_PROVIDER_SITE_OTHER): Payer: Medicare Other | Admitting: Internal Medicine

## 2022-01-26 ENCOUNTER — Encounter: Payer: Self-pay | Admitting: Internal Medicine

## 2022-01-26 VITALS — BP 120/80 | HR 59 | Temp 98.0°F | Ht 61.0 in | Wt 149.4 lb

## 2022-01-26 DIAGNOSIS — Z86010 Personal history of colonic polyps: Secondary | ICD-10-CM | POA: Diagnosis not present

## 2022-01-26 DIAGNOSIS — K219 Gastro-esophageal reflux disease without esophagitis: Secondary | ICD-10-CM | POA: Diagnosis not present

## 2022-01-26 DIAGNOSIS — K5900 Constipation, unspecified: Secondary | ICD-10-CM

## 2022-01-26 MED ORDER — LUBIPROSTONE 8 MCG PO CAPS: 8.0000 ug | ORAL_CAPSULE | Freq: Every day | ORAL | 1 refills | Status: DC

## 2022-01-26 MED ORDER — PANTOPRAZOLE SODIUM 40 MG PO TBEC: 40.0000 mg | DELAYED_RELEASE_TABLET | Freq: Every day | ORAL | 3 refills | Status: DC

## 2022-01-26 NOTE — Addendum Note (Signed)
Addended by: Orland Jarred on: 01/26/2022 04:24 PM   Modules accepted: Orders

## 2022-01-26 NOTE — Progress Notes (Signed)
Primary Care Physician:  [name], MD Primary Gastroenterologist:  Dr. Gala Romney  Pre-Procedure History & Physical: HPI:  Linda English is a 66 y.o. female here for follow-up of altered bowel function, history of pelvic floor dyssynergy, GERD, chronic nausea, history of rectal cancer s/p low anterior resection 2003; colonic adenomas found and removed last year; due for surveillance colonoscopy 4 years from now. History of esophageal dysphagia; prior EGD normal-responded to a large bore [facility] dilation empirically.  Today, she tells me her GERD is well controlled on Protonix 40 mg daily.  No dysphagia. Amitiza utilized a dose of 8 mcg once daily to twice daily with good control of her constipation.  Past Medical History:  Diagnosis Date   Acid reflux    Allergic rhinitis    Anxiety    Arthritis    osteoarthritis   Bipolar disorder (HCC)    Chronic headache    Depression    Diverticula of colon    DM (diabetes mellitus) (HCC)    Gastric erosions    Gastric polyps    benign    Hemorrhoids    Hiatal hernia    Hypertension    IBS (irritable bowel syndrome)    Lichen planus    Obstructive sleep apnea    Panic disorder    Parkinson's disease ([place])    Rectal cancer ([place]) 2003   ileostomy and reversal   Subdural hematoma    Tubular adenoma     Past Surgical History:  Procedure Laterality Date   ABDOMINAL HYSTERECTOMY     APPENDECTOMY     BACK SURGERY     BIOPSY  10/01/2019   Procedure: BIOPSY;  Surgeon: Daneil Dolin, MD;  [facility];  Service: Endoscopy;;  gastric   CESAREAN SECTION     X  2   CHOLECYSTECTOMY     COLONOSCOPY  08/2007   DR Gala Romney, friable anal canal hemorrhoids, surgical anastomosis at 3cm, distal scattered tics   COLONOSCOPY  12/15/2010   anal papilla and internal hemorrhoids/diminutive polyp in the base of the cecum. Past, tubular adenoma.. Next colonoscopy due in April 2017.   COLONOSCOPY  10/27/2005   RMR: Anal canal  hemorrhoids. Surgical anastomosis at 3 cm from the anal verge appeared normal. Few scattered distal diverticula. The residual  colonic mucosa appeared normal. I suspect the patient bled from hemorrhoids   COLONOSCOPY N/A 01/15/2015   BUL:AGTXMI residual rectum and colon. next tcs 12/2019   COLONOSCOPY WITH PROPOFOL N/A 05/01/2020   Procedure: COLONOSCOPY WITH PROPOFOL;  Surgeon: Daneil Dolin, MD;  Two 3-8 millimeters polyps in ascending colon, diverticulosis in descending colon, surgical anastomosis at 4 cm, otherwise normal exam.  Pathology with tubular adenomas.  Repeat colonoscopy in 5 years.   ESOPHAGEAL DILATION N/A 06/27/2015   Procedure: ESOPHAGEAL DILATION;  Surgeon: Daneil Dolin, MD;  [facility];  Service: Endoscopy;  Laterality: N/A;   ESOPHAGOGASTRODUODENOSCOPY  05/2002   DR Gala Romney, normal   ESOPHAGOGASTRODUODENOSCOPY  08/03/2011   RMR: small HH/ gastric polyps   ESOPHAGOGASTRODUODENOSCOPY N/A 06/27/2015   Dr. Gala Romney: Mild erosive reflux esophagitits. Status post passage of a [place] dilator. Hiatal hernia. Gastric polyps and abnormal gastric mucosa of doubtful clinical significane. status post biopsy, benign fundic gland polyp, negative H.pylori   ESOPHAGOGASTRODUODENOSCOPY (EGD) WITH PROPOFOL N/A 10/01/2019   Markice Torbert: Normal esophagus status post esophageal dilation for dysphagia, erythematous mucosa in the stomach biopsy consistent with reactive gastropathy, no H. pylori, multiple gastric polyps (biopsy  fundic gland)   low anterior rection  2003   RECTAL CANCER   MALONEY DILATION N/A 10/01/2019   Procedure: MALONEY DILATION;  Surgeon: Daneil Dolin, MD;  [facility];  Service: Endoscopy;  Laterality: N/A;   POLYPECTOMY  05/01/2020   Procedure: POLYPECTOMY;  Surgeon: Daneil Dolin, MD;  [facility];  Service: Endoscopy;;   SPINE SURGERY  2001   L5,S1 HEMILAMINOTOMY AND DISCECTOMY/DR DEATON    Prior to Admission medications   Medication Sig Start  Date End Date Taking? Authorizing Provider  acetaminophen (TYLENOL) 500 MG tablet Take 1,000 mg by mouth every 8 (eight) hours as needed for moderate pain or headache.    [provider]  carbamazepine (TEGRETOL) 200 MG tablet Take 200 mg by mouth at bedtime. 07/15/21   [provider]  diltiazem (TIAZAC) 240 MG 24 hr capsule Take 1 capsule by mouth daily at 12 noon.    [provider]  famotidine (PEPCID) 40 MG tablet TAKE 1 TABLET(40 MG) BY MOUTH AT BEDTIME 09/07/21   Annitta Needs, NP  furosemide (LASIX) 40 MG tablet Take 1 tablet (40 mg total) by mouth daily. 01/20/20   Orson Eva, MD  lamoTRIgine (LAMICTAL) 200 MG tablet Take 200 mg by mouth 2 (two) times daily. 06/11/13   [provider]  lidocaine (LIDODERM) 5 % Place 2 patches onto the skin as needed. 09/05/20   [provider]  LORazepam (ATIVAN) 1 MG tablet Take 1 mg by mouth 3 (three) times daily as needed for anxiety. 04/16/20   [provider]  losartan (COZAAR) 50 MG tablet Take 1 tablet by mouth at bedtime.    [provider]  lubiprostone (AMITIZA) 8 MCG capsule Take 1 capsule (8 mcg total) by mouth 2 (two) times daily with a meal. 07/28/21   Annitta Needs, NP  metoprolol succinate (TOPROL-XL) 50 MG 24 hr tablet Take 1 tablet (50 mg total) by mouth daily. Take with or immediately following a meal. 01/20/20   Tat, Shanon Brow, MD  mupirocin ointment (BACTROBAN) 2 % Apply 1 application topically as needed. 07/15/21   [provider]  pantoprazole (PROTONIX) 40 MG tablet Take 1 tablet (40 mg total) by mouth daily. Take 30 minutes before breakfast 01/19/22   Annitta Needs, NP  polyethylene glycol (MIRALAX / GLYCOLAX) 17 g packet Take 17 g by mouth daily as needed.    [provider]  traMADol (ULTRAM) 50 MG tablet Take 50 mg by mouth every 12 (twelve) hours as needed. 08/29/20   [provider]  triamcinolone cream (KENALOG) 0.1 % Apply 1 application topically 2  (two) times daily as needed (rash).    [provider]    Allergies as of 01/26/2022 - Review Complete 07/28/2021  Allergen Reaction Noted   Lithium Nausea And Vomiting 06/27/2015   Excedrin extra strength [aspirin-acetaminophen-caffeine] Other (See Comments) 10/28/2014   Geodon [ziprasidone hcl]  05/01/2020   Klonopin [clonazepam] Other (See Comments) 10/23/2019    Family History  Problem Relation Age of Onset   Colon cancer Paternal Grandfather 72   Colon polyps Brother    Colon polyps Cousin    Diabetes Mother    Hypertension Mother    Hypertension Father    Heart attack Father    Heart failure Father     Social History   Socioeconomic History   Marital status: Divorced    Spouse name: Not on file   Number of children: Not on file  Years of education: Not on file   Highest education level: Not on file  Occupational History   Occupation: Product manager: RETIRED    Comment: [place]  Tobacco Use   Smoking status: Never   Smokeless tobacco: Never   Tobacco comments:    Never smoked  Vaping Use   Vaping Use: Never used  Substance and Sexual Activity   Alcohol use: No   Drug use: No   Sexual activity: Never  Other Topics Concern   Not on file  Social History Narrative   Not on file   Social Determinants of Health   Financial Resource Strain: Not on file  Food Insecurity: Not on file  Transportation Needs: Not on file  Physical Activity: Not on file  Stress: Not on file  Social Connections: Not on file  Intimate Partner Violence: Not on file    Review of Systems: See HPI, otherwise negative ROS  Physical Exam: There were no vitals taken for this visit. General:   Alert,  Well-developed, well-nourished, pleasant and cooperative in NAD Neck:  Supple; no masses or thyromegaly. No significant cervical adenopathy. Lungs:  Clear throughout to auscultation.   No wheezes, crackles, or rhonchi. No acute distress. Heart:  Regular rate and  rhythm; no murmurs, clicks, rubs,  or gallops. Abdomen: Non-distended, normal bowel sounds.  Soft and nontender without appreciable mass or hepatosplenomegaly.  Pulses:  Normal pulses noted. Extremities:  Without clubbing or edema.  Impression/Plan: Impression: 66 year old lady with longstanding GERD history of mild reflux esophagitis well-controlled on pantoprazole 40 mg daily  History of low anterior resection for rectal cancer 20 years ago; only lower GI tract symptoms these days is that of constipation.  History of colonic adenoma.  Amitiza even at low dose tends to be too strong.  Recommendations: Continue Protonix 40 mg daily before breakfast (dispense 90 with 3 refills)  Begin Benefiber 1 tablespoon daily.  Call me in 6 weeks and let me know how this is working for you.  Our goal is at least 3 bowel movements every week or 1 every other day  May use Amitiza 1 8 mcg gelcap daily on an as-needed basis for constipation not relieved by Benefiber (dispense 30 with 1 refill)  Office visit in 6 months  Repeat colonoscopy 2026      Notice: This dictation was prepared with Dragon dictation along with smaller phrase technology. Any transcriptional errors that result from this process are unintentional and may not be corrected upon review.

## 2022-01-26 NOTE — Patient Instructions (Addendum)
It was good to see you again today!  Continue Protonix 40 mg daily before breakfast (dispense 90 with 3 refills)  Begin Benefiber 1 tablespoon daily.  Call me in 6 weeks and let me know how this is working for you.  Our goal is at least 3 bowel movements every week or 1 every other day  May use Amitiza 1 8 mcg gelcap daily on an as-needed basis for constipation not relieved by Benefiber (dispense 30 with 1 refill)  Office visit in 6 months  Repeat colonoscopy 2026

## 2022-01-27 DIAGNOSIS — E1129 Type 2 diabetes mellitus with other diabetic kidney complication: Secondary | ICD-10-CM | POA: Diagnosis not present

## 2022-01-27 DIAGNOSIS — I10 Essential (primary) hypertension: Secondary | ICD-10-CM | POA: Diagnosis not present

## 2022-01-27 DIAGNOSIS — K219 Gastro-esophageal reflux disease without esophagitis: Secondary | ICD-10-CM | POA: Diagnosis not present

## 2022-02-24 DIAGNOSIS — R001 Bradycardia, unspecified: Secondary | ICD-10-CM | POA: Diagnosis not present

## 2022-02-24 DIAGNOSIS — R0602 Shortness of breath: Secondary | ICD-10-CM | POA: Diagnosis not present

## 2022-02-25 ENCOUNTER — Telehealth: Payer: Self-pay

## 2022-02-25 DIAGNOSIS — K219 Gastro-esophageal reflux disease without esophagitis: Secondary | ICD-10-CM

## 2022-02-25 NOTE — Telephone Encounter (Signed)
Refill request for famotidine 40 mg tablets received qty:30 to be sent to [facility]. Last ov was with Dr. Gala Romney on 01/26/22.

## 2022-03-01 MED ORDER — FAMOTIDINE 40 MG PO TABS: ORAL_TABLET | ORAL | 3 refills | Status: DC

## 2022-03-01 NOTE — Addendum Note (Signed)
Addended by: Annitta Needs on: 03/01/2022 09:51 AM   Modules accepted: Orders

## 2022-03-01 NOTE — Telephone Encounter (Signed)
Completed.

## 2022-03-03 DIAGNOSIS — R001 Bradycardia, unspecified: Secondary | ICD-10-CM | POA: Diagnosis not present

## 2022-03-08 DIAGNOSIS — I952 Hypotension due to drugs: Secondary | ICD-10-CM | POA: Diagnosis not present

## 2022-03-08 DIAGNOSIS — Z79899 Other long term (current) drug therapy: Secondary | ICD-10-CM | POA: Diagnosis not present

## 2022-03-08 DIAGNOSIS — R296 Repeated falls: Secondary | ICD-10-CM | POA: Diagnosis not present

## 2022-03-08 DIAGNOSIS — G2111 Neuroleptic induced parkinsonism: Secondary | ICD-10-CM | POA: Diagnosis not present

## 2022-03-08 DIAGNOSIS — E1143 Type 2 diabetes mellitus with diabetic autonomic (poly)neuropathy: Secondary | ICD-10-CM | POA: Diagnosis not present

## 2022-03-16 DIAGNOSIS — I5032 Chronic diastolic (congestive) heart failure: Secondary | ICD-10-CM | POA: Diagnosis not present

## 2022-03-16 DIAGNOSIS — E1129 Type 2 diabetes mellitus with other diabetic kidney complication: Secondary | ICD-10-CM | POA: Diagnosis not present

## 2022-03-16 DIAGNOSIS — Z79899 Other long term (current) drug therapy: Secondary | ICD-10-CM | POA: Diagnosis not present

## 2022-03-16 DIAGNOSIS — I10 Essential (primary) hypertension: Secondary | ICD-10-CM | POA: Diagnosis not present

## 2022-03-16 DIAGNOSIS — N1832 Chronic kidney disease, stage 3b: Secondary | ICD-10-CM | POA: Diagnosis not present

## 2022-03-22 DIAGNOSIS — N1832 Chronic kidney disease, stage 3b: Secondary | ICD-10-CM | POA: Diagnosis not present

## 2022-03-22 DIAGNOSIS — I5032 Chronic diastolic (congestive) heart failure: Secondary | ICD-10-CM | POA: Diagnosis not present

## 2022-03-22 DIAGNOSIS — R7309 Other abnormal glucose: Secondary | ICD-10-CM | POA: Diagnosis not present

## 2022-03-22 DIAGNOSIS — E1122 Type 2 diabetes mellitus with diabetic chronic kidney disease: Secondary | ICD-10-CM | POA: Diagnosis not present

## 2022-04-01 ENCOUNTER — Other Ambulatory Visit: Payer: Self-pay | Admitting: *Deleted

## 2022-04-01 NOTE — Patient Outreach (Signed)
  Care Coordination   04/01/2022  Name: NIDA MANFREDI MRN: [id number] DOB: 12/21/1955   Care Coordination Outreach Attempts:  An unsuccessful telephone outreach was attempted today to offer the patient information about available care coordination services as a benefit of their health plan. A HIPAA compliant message was left on voicemail, providing contact information for CSW, encouraging patient to return CSW's call at her earliest convenience.  Follow Up Plan:  Additional outreach attempts will be made to offer the patient care coordination information and services.   Encounter Outcome:  No Answer.   Care Coordination Interventions Activated:  No    Care Coordination Interventions:  No, not indicated.    Nat Christen, BSW, MSW, LCSW  Licensed Education officer, environmental Health System  Mailing [place] [address], [place], [place] [address] Physical Address-[address] [place], [place] [address] Toll Free Main # [phone] Fax # [phone] Cell # [phone] [email or web address]

## 2022-04-07 ENCOUNTER — Ambulatory Visit: Payer: Self-pay | Admitting: *Deleted

## 2022-04-07 DIAGNOSIS — G459 Transient cerebral ischemic attack, unspecified: Secondary | ICD-10-CM

## 2022-04-08 ENCOUNTER — Telehealth: Payer: Self-pay | Admitting: *Deleted

## 2022-04-08 ENCOUNTER — Encounter: Payer: Self-pay | Admitting: *Deleted

## 2022-04-08 NOTE — Telephone Encounter (Signed)
   Telephone encounter was:  Unsuccessful.  04/08/2022 Name: GLENDY BARSANTI MRN: [id number] DOB: 09-16-1955  Unsuccessful outbound call made today to assist with:  Transportation Needs   Outreach Attempt:  1st Attempt  A HIPAA compliant voice message was left requesting a return call.  Instructed patient to call back at [phone]. [place] [phone] [address] , [place] [address] Email : [email or web address] [email or web address]

## 2022-04-08 NOTE — Patient Outreach (Signed)
  Care Coordination   Initial Visit Note   04/08/2022  Name: Linda English MRN: [id number] DOB: 28-Nov-1955  Linda English is a 66 y.o. year old female who sees [name], MD for primary care. I spoke with Linda English by phone today.  What matters to the patients health and wellness today?  1. Fish farm manager.  2.  Communicate Chest Pain and Tightness, Shortness of Breath, Fatigue and EKG Rhythm of 30, to Primary Care Provider, Dr. [name].     Goals Addressed               This Visit's Progress     COMPLETED: Fish farm manager. (pt-stated)   On track     Care Coordination Interventions:  PHQ2/PHQ9 Depression Screen Completed & Results Reviewed.   Suicidal Ideation/Homicidal Ideation Assessed - None Present. Solution-Focused Strategies Employed. Deep Breathing Exercises, Relaxation Techniques & Mindfulness Meditation Strategies Taught & Encouraged Daily.   Active Listening/Reflection Utilized.  Emotional Support Provided. Problem Solving/Task Centered Solutions Developed.   Provided Psychoeducation for Mental Health Concerns. Provided Brief Cognitive Behavioral Therapy. Reviewed Mental Health Medications & Discussed Importance of Compliance. Quality of Sleep Assessed & Sleep Hygiene Techniques Promoted. Participation in Counseling Emphasized. Participation in Support Group Reviewed. Verbalization of Feelings Encouraged.  Referred to [place] for [employer] and Resources. Please Accept All Calls from [facility], to [facility] and Resources.         SDOH assessments and interventions completed:  Yes  SDOH Interventions Today    Flowsheet Row Most Recent Value  SDOH Interventions   Food Insecurity Interventions Intervention Not Indicated  Financial Strain Interventions Intervention Not Indicated  Housing Interventions  Intervention Not Indicated  Physical Activity Interventions Patient Refused  Stress Interventions Intervention Not Indicated  Social Connections Interventions Intervention Not Indicated  Transportation Interventions Other (Comment)  [Referred to [facility] for [employer] and Resources]        Care Coordination Interventions Activated:  Yes   Care Coordination Interventions:  Yes, provided.   Follow up plan: No further intervention required.   Encounter Outcome:  Pt. Visit Completed.   Nat Christen, BSW, MSW, LCSW  Licensed Education officer, environmental Health System  Mailing [place] [address] [place], [place] [address] Physical Address-[address] [place], [place] [address] Toll Free Main # [phone] Fax # [phone] Cell # [phone] [email or web address]

## 2022-04-08 NOTE — Patient Instructions (Signed)
Visit Information  Thank you for taking time to visit with me today. Please don't hesitate to contact me if I can be of assistance to you.   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   Suicidal Ideation/Homicidal Ideation Assessed - None Present. Solution-Focused Strategies Employed. Deep Breathing Exercises, Relaxation Techniques & Mindfulness Meditation Strategies Taught & Encouraged Daily.   Active Listening/Reflection Utilized.  Emotional Support Provided. Problem Solving/Task Centered Solutions Developed.   Provided Psychoeducation for Mental Health Concerns. Provided Brief Cognitive Behavioral Therapy. Reviewed Mental Health Medications & Discussed Importance of Compliance. Quality of Sleep Assessed & Sleep Hygiene Techniques Promoted. Participation in Counseling Emphasized. Participation in Support Group Reviewed. Verbalization of Feelings Encouraged.  Referred to [place] for [employer] and Resources. Please Accept All Calls from [facility], to [facility] and Resources.         Please call the care guide team at [phone] if you need to cancel or reschedule your appointment.   If you are experiencing a Mental Health or [place] or need someone to talk to, please call the Suicide and Crisis Lifeline: 988 call the [place] National Suicide Prevention Lifeline: 6405630621 or TTY: 818-504-9565 TTY (980) 267-7114) to talk to a trained counselor call 1-800-273-TALK (toll free, 24 hour hotline) go to [facility] [address], [place] [phone]) call the [place]: [phone] call 911  Patient  verbalizes understanding of instructions and care plan provided today and agrees to view in [place]. Active MyChart status and patient understanding of how to access instructions and care plan via MyChart confirmed with patient.     No further follow up required.  Nat Christen, BSW, MSW, LCSW  Licensed Education officer, environmental Health System  Mailing [place] [address] [place], [place] [address] Physical Address-[address], [place], [place] [address] Toll Free Main # [phone] Fax # [phone] Cell # [phone] [email or web address]

## 2022-04-09 ENCOUNTER — Telehealth: Payer: Self-pay | Admitting: *Deleted

## 2022-04-09 NOTE — Telephone Encounter (Signed)
   Telephone encounter was:  Unsuccessful.  04/09/2022 Name: Linda English MRN: [id number] DOB: October 17, 1955  Unsuccessful outbound call made today to assist with:  Transportation Needs   Outreach Attempt:  2nd Attempt  A HIPAA compliant voice message was left requesting a return call.  Instructed patient to call back at   Instructed patient to call back at [phone]  at their earliest convenience.  [place] [phone] [address] , [place] [address] Email : Ashby [email or web address]. Greenauer-moran [email or web address]

## 2022-04-14 ENCOUNTER — Telehealth: Payer: Self-pay | Admitting: *Deleted

## 2022-04-14 ENCOUNTER — Encounter: Payer: Medicare Other | Admitting: *Deleted

## 2022-04-14 NOTE — Telephone Encounter (Signed)
   Telephone encounter was:  Unsuccessful.  04/14/2022 Name: Linda English MRN: [id number] DOB: 1955/12/19  Unsuccessful outbound call made today to assist with:  Transportation Needs   Outreach Attempt:  3rd Attempt.  Referral closed unable to contact patient.  A HIPAA compliant voice message was left requesting a return call.  Instructed patient to call back at [phone].  [place] [phone] [address] , [place] [address] Email : [email or web address]

## 2022-05-29 DIAGNOSIS — I10 Essential (primary) hypertension: Secondary | ICD-10-CM | POA: Diagnosis not present

## 2022-05-29 DIAGNOSIS — E1129 Type 2 diabetes mellitus with other diabetic kidney complication: Secondary | ICD-10-CM | POA: Diagnosis not present

## 2022-05-29 DIAGNOSIS — K219 Gastro-esophageal reflux disease without esophagitis: Secondary | ICD-10-CM | POA: Diagnosis not present

## 2022-06-01 ENCOUNTER — Ambulatory Visit
Admission: EM | Admit: 2022-06-01 | Discharge: 2022-06-01 | Disposition: A | Discharge status: Home / Self Care | Payer: Medicare Other | Attending: Family Medicine | Admitting: Family Medicine

## 2022-06-01 ENCOUNTER — Other Ambulatory Visit: Payer: Self-pay

## 2022-06-01 DIAGNOSIS — R5383 Other fatigue: Secondary | ICD-10-CM

## 2022-06-01 DIAGNOSIS — E1165 Type 2 diabetes mellitus with hyperglycemia: Secondary | ICD-10-CM

## 2022-06-01 DIAGNOSIS — R030 Elevated blood-pressure reading, without diagnosis of hypertension: Secondary | ICD-10-CM | POA: Diagnosis not present

## 2022-06-01 DIAGNOSIS — R739 Hyperglycemia, unspecified: Secondary | ICD-10-CM

## 2022-06-01 LAB — POCT FASTING CBG KUC MANUAL ENTRY: POCT Glucose (KUC): 109 mg/dL — AB (ref 70–99)

## 2022-06-01 MED ORDER — DAPAGLIFLOZIN PROPANEDIOL 5 MG PO TABS: 5.0000 mg | ORAL_TABLET | Freq: Every day | ORAL | 0 refills | Status: DC

## 2022-06-01 NOTE — ED Triage Notes (Signed)
Pt reports her blood sugar is being high for the past 3 days. Reports was 211 (fasting) today. Pt reports PCP stopped the insulin for her 3 years ago.

## 2022-06-01 NOTE — ED Provider Notes (Signed)
[facility]    CSN: [id number] Arrival date & time: 06/01/22  1045      History   Chief Complaint Chief Complaint  Patient presents with   Hyperglycemia    HPI Linda English is a 66 y.o. female.   Patient presenting today stating that she has not been feeling well for about 2 months.  States abdominal pain off and on, fatigue, decreased appetite, urinary frequency and urgency.  She denies any chest pain, shortness of breath, mental status changes, weakness numbness or tingling of extremities, vomiting, diarrhea, fevers, chills associated with symptoms.  She states she initially attributed it to a new bladder control medication that she was started on, but once she stopped that her symptoms did not improve.  She used to have issues with diabetes and was on sliding scale insulin, metformin, glipizide in the past at different times but was taken off all of these about 2 years ago and has had well-controlled A1c's per patient.  She states she did not tolerate the metformin well and the glipizide caused hypoglycemic episodes.  She started checking her sugars the last few days due to not feeling well and states she has gotten readings up to 211 at times, which was a fasting reading.  This has very much concerned her and she is wondering if she needs to go back on medication.  She also has a history of hypertension on multiple medications.  States she used to be on diltiazem but was taken off due to bradycardia.  She states she is compliantly taking her metoprolol and losartan still for hypertension.    Past Medical History:  Diagnosis Date   Acid reflux    Allergic rhinitis    Anxiety    Arthritis    osteoarthritis   Bipolar disorder (HCC)    Chronic headache    Depression    Diverticula of colon    DM (diabetes mellitus) (HCC)    Gastric erosions    Gastric polyps    benign    Hemorrhoids    Hiatal hernia    Hypertension    IBS (irritable bowel syndrome)     Lichen planus    Obstructive sleep apnea    Panic disorder    Parkinson's disease    Rectal cancer ([place]) 2003   ileostomy and reversal   Subdural hematoma ([place])    Tubular adenoma     Patient Active Problem List   Diagnosis Date Noted   RUQ abdominal pain 09/11/2020   History of rectal cancer 02/15/2020   Alternating constipation and diarrhea 02/15/2020   Underweight 02/12/2020   Sensory disturbance 01/18/2020   Exertional dyspnea 01/17/2020   Hypokalemia 82/42/3536   Acute diastolic CHF (congestive heart failure) ([place]) 01/17/2020   Acute respiratory failure with hypoxia ([place]) 01/17/2020   Hypoxia    Hypercortisolemia ([place]) 12/11/2019   Protein-calorie malnutrition, severe 10/25/2019   Acute renal failure superimposed on stage 3a chronic kidney disease ([place]) 10/24/2019   Intractable vomiting with nausea 10/24/2019   Acute renal failure superimposed on stage 3 chronic kidney disease ([place]) 14/43/1540   Acute metabolic encephalopathy 08/67/6195   Vertigo as late effect of stroke 08/13/2019   Dizziness 08/13/2019   DKA (diabetic ketoacidoses) 08/12/2019   Rt Sided Subdural hematoma, post-traumatic  07/02/2019   AKI (acute kidney injury) ([place]) 06/26/2019   ARF (acute renal failure) ([place]) 06/24/2019   Nausea & vomiting 06/05/2019   Diarrhea 07/27/2017   Transient ischemic attack 08/09/2015   Reflux  esophagitis    Gastric polyp    Dyspepsia 06/13/2015   Dysphagia 06/13/2015   Incisional irritation 06/18/2013   Wound drainage 06/18/2013   Bipolar 1 disorder ([place]) 06/08/2012    Class: Chronic   Anorexia 05/23/2012   Insomnia 05/23/2012   Weight loss 05/23/2012   Abnormal weight loss 04/10/2012   Esophageal dysphagia 04/10/2012   Nausea 09/08/2011   GERD (gastroesophageal reflux disease) 07/14/2011   Epigastric pain 07/14/2011   Constipation 07/14/2011   Knee pain 02/09/2011   OA (osteoarthritis) of knee 02/09/2011   Family hx of colon cancer 11/30/2010   Bowel habit  changes 11/30/2010   OSTEOARTHRITIS, KNEE 08/05/2010   PES PLANUS 08/05/2010   RECTAL CANCER 10/23/2007   Diabetes ([place]) 10/23/2007   HYPERLIPIDEMIA 10/23/2007   Anxiety state 10/23/2007   PANIC DISORDER 10/23/2007   DEPRESSION 10/23/2007   OBSTRUCTIVE SLEEP APNEA 10/23/2007   Essential hypertension 10/23/2007   ALLERGIC RHINITIS 10/23/2007   IBS 10/23/2007   HEADACHE, CHRONIC 10/23/2007    Past Surgical History:  Procedure Laterality Date   ABDOMINAL HYSTERECTOMY     APPENDECTOMY     BACK SURGERY     BIOPSY  10/01/2019   Procedure: BIOPSY;  Surgeon: Daneil Dolin, MD;  [facility];  Service: Endoscopy;;  gastric   CESAREAN SECTION     X  2   CHOLECYSTECTOMY     COLONOSCOPY  08/2007   DR Gala Romney, friable anal canal hemorrhoids, surgical anastomosis at 3cm, distal scattered tics   COLONOSCOPY  12/15/2010   anal papilla and internal hemorrhoids/diminutive polyp in the base of the cecum. Past, tubular adenoma.. Next colonoscopy due in April 2017.   COLONOSCOPY  10/27/2005   RMR: Anal canal hemorrhoids. Surgical anastomosis at 3 cm from the anal verge appeared normal. Few scattered distal diverticula. The residual  colonic mucosa appeared normal. I suspect the patient bled from hemorrhoids   COLONOSCOPY N/A 01/15/2015   [id number] residual rectum and colon. next tcs 12/2019   COLONOSCOPY WITH PROPOFOL N/A 05/01/2020   Procedure: COLONOSCOPY WITH PROPOFOL;  Surgeon: Daneil Dolin, MD;  Two 3-8 millimeters polyps in ascending colon, diverticulosis in descending colon, surgical anastomosis at 4 cm, otherwise normal exam.  Pathology with tubular adenomas.  Repeat colonoscopy in 5 years.   ESOPHAGEAL DILATION N/A 06/27/2015   Procedure: ESOPHAGEAL DILATION;  Surgeon: Daneil Dolin, MD;  [facility];  Service: Endoscopy;  Laterality: N/A;   ESOPHAGOGASTRODUODENOSCOPY  05/2002   DR Gala Romney, normal   ESOPHAGOGASTRODUODENOSCOPY  08/03/2011   RMR: small HH/ gastric polyps    ESOPHAGOGASTRODUODENOSCOPY N/A 06/27/2015   Dr. Gala Romney: Mild erosive reflux esophagitits. Status post passage of a Maloney dilator. Hiatal hernia. Gastric polyps and abnormal gastric mucosa of doubtful clinical significane. status post biopsy, benign fundic gland polyp, negative H.pylori   ESOPHAGOGASTRODUODENOSCOPY (EGD) WITH PROPOFOL N/A 10/01/2019   rourk: Normal esophagus status post esophageal dilation for dysphagia, erythematous mucosa in the stomach biopsy consistent with reactive gastropathy, no H. pylori, multiple gastric polyps (biopsy fundic gland)   low anterior rection  2003   RECTAL CANCER   MALONEY DILATION N/A 10/01/2019   Procedure: MALONEY DILATION;  Surgeon: Daneil Dolin, MD;  [facility];  Service: Endoscopy;  Laterality: N/A;   POLYPECTOMY  05/01/2020   Procedure: POLYPECTOMY;  Surgeon: Daneil Dolin, MD;  [facility];  Service: Endoscopy;;   SPINE SURGERY  2001   L5,S1 HEMILAMINOTOMY AND DISCECTOMY/DR Rolin Barry  OB History     Gravida      Para      Term      Preterm      AB      Living  2      SAB      IAB      Ectopic      Multiple      Live Births               Home Medications    Prior to Admission medications   Medication Sig Start Date End Date Taking? Authorizing Provider  dapagliflozin propanediol (FARXIGA) 5 MG TABS tablet Take 1 tablet (5 mg total) by mouth daily before breakfast. 06/01/22  Yes Volney American, PA-C  acetaminophen (TYLENOL) 500 MG tablet Take 1,000 mg by mouth every 8 (eight) hours as needed for moderate pain or headache.    [provider]  clobetasol cream (TEMOVATE) 0.05 % Apply topically. 12/15/21   [provider]  diltiazem (TIAZAC) 240 MG 24 hr capsule Take 1 capsule by mouth daily at 12 noon.    [provider]  famotidine (PEPCID) 40 MG tablet TAKE 1 TABLET(40 MG) BY MOUTH AT BEDTIME 03/01/22   Annitta Needs, NP  furosemide (LASIX) 40 MG tablet Take 1  tablet (40 mg total) by mouth daily. 01/20/20   Orson Eva, MD  lamoTRIgine (LAMICTAL) 200 MG tablet Take 200 mg by mouth 2 (two) times daily. 06/11/13   [provider]  LORazepam (ATIVAN) 1 MG tablet Take 1 mg by mouth 3 (three) times daily as needed for anxiety. 04/16/20   [provider]  losartan (COZAAR) 50 MG tablet Take 1 tablet by mouth at bedtime.    [provider]  lubiprostone (AMITIZA) 8 MCG capsule Take 1 capsule (8 mcg total) by mouth daily with breakfast. 01/26/22   Rourk, Cristopher Estimable, MD  metoprolol succinate (TOPROL-XL) 50 MG 24 hr tablet Take 1 tablet (50 mg total) by mouth daily. Take with or immediately following a meal. 01/20/20   Tat, Shanon Brow, MD  pantoprazole (PROTONIX) 40 MG tablet Take 1 tablet (40 mg total) by mouth daily. Take 30 minutes before breakfast 01/26/22   Rourk, Cristopher Estimable, MD  potassium chloride 20 MEQ/15ML (10%) SOLN Take 20 mEq by mouth daily. 01/19/22   [provider]  pravastatin (PRAVACHOL) 20 MG tablet Take 20 mg by mouth at bedtime. 12/15/21   [provider]  pyridostigmine (MESTINON) 60 MG tablet Take 30 mg by mouth 3 (three) times daily. 01/11/22   [provider]  traMADol (ULTRAM) 50 MG tablet Take 50 mg by mouth every 12 (twelve) hours as needed. 08/29/20   [provider]    Family History Family History  Problem Relation Age of Onset   Colon cancer Paternal Grandfather 60   Colon polyps Brother    Colon polyps Cousin    Diabetes Mother    Hypertension Mother    Hypertension Father    Heart attack Father    Heart failure Father     Social History Social History   Tobacco Use   Smoking status: Never    Passive exposure: Never   Smokeless tobacco: Never   Tobacco comments:    Never smoked  Vaping Use   Vaping Use: Never used  Substance Use Topics   Alcohol use: No   Drug use: No     Allergies   Lithium, Excedrin extra strength [aspirin-acetaminophen-caffeine], Geodon  [ziprasidone  hcl], and Klonopin [clonazepam]   Review of Systems Review of Systems Per HPI  Physical Exam Triage Vital Signs ED Triage Vitals  Enc Vitals Group     BP 06/01/22 1214 (!) 180/104     Pulse Rate 06/01/22 1214 71     Resp 06/01/22 1214 18     Temp 06/01/22 1214 98 F (36.7 C)     Temp Source 06/01/22 1214 Oral     SpO2 06/01/22 1214 95 %     Weight --      Height --      Head Circumference --      Peak Flow --      Pain Score 06/01/22 1212 0     Pain Loc --      Pain Edu? --      Excl. in [place]? --    No data found.  Updated Vital Signs BP (!) 186/102 (BP Location: Right Arm)   Pulse 71   Temp 98 F (36.7 C) (Oral)   Resp 18   SpO2 95%   Visual Acuity Right Eye Distance:   Left Eye Distance:   Bilateral Distance:    Right Eye Near:   Left Eye Near:    Bilateral Near:     Physical Exam Vitals and nursing note reviewed.  Constitutional:      Appearance: Normal appearance. She is not ill-appearing.  HENT:     Head: Atraumatic.     Mouth/Throat:     Mouth: Mucous membranes are moist.     Pharynx: Oropharynx is clear.  Eyes:     Extraocular Movements: Extraocular movements intact.     Conjunctiva/sclera: Conjunctivae normal.  Cardiovascular:     Rate and Rhythm: Normal rate and regular rhythm.     Heart sounds: Normal heart sounds.  Pulmonary:     Effort: Pulmonary effort is normal.     Breath sounds: Normal breath sounds. No wheezing or rales.  Abdominal:     General: Bowel sounds are normal. There is no distension.     Palpations: Abdomen is soft.     Tenderness: There is no abdominal tenderness. There is no guarding.  Musculoskeletal:        General: Normal range of motion.     Cervical back: Normal range of motion and neck supple.  Skin:    General: Skin is warm and dry.  Neurological:     Mental Status: She is alert and oriented to person, place, and time. Mental status is at baseline.     Motor: No weakness.     Gait: Gait normal.   Psychiatric:        Mood and Affect: Mood normal.        Thought Content: Thought content normal.        Judgment: Judgment normal.      UC Treatments / Results  Labs (all labs ordered are listed, but only abnormal results are displayed) Labs Reviewed  POCT FASTING CBG [facility] MANUAL ENTRY - Abnormal; Notable for the following components:      Result Value   POCT Glucose ([facility]) 109 (*)    All other components within normal limits  CBC WITH DIFFERENTIAL/PLATELET  COMPREHENSIVE METABOLIC PANEL    EKG   Radiology No results found.  Procedures Procedures (including critical care time)  Medications Ordered in UC Medications - No data to display  Initial Impression / Assessment and Plan / UC Course  I have reviewed the triage vital signs and the  nursing notes.  Pertinent labs & imaging results that were available during my care of the patient were reviewed by me and considered in my medical decision making (see chart for details).     Significantly hypertensive today in triage, otherwise vital signs benign and reassuring.  Her exam is overall reassuring with no focal deficits or abnormalities.  Her point-of-care glucose today is 109.  Discussed that adding any diabetes medication would have to be done very cautiously as her readings seem to fluctuate quite a bit and discussed the dangers of low spells.  We will cautiously add Farxiga and await follow-up in the next week or so with primary care for a recheck and further recommendations.  She has had poor tolerance to metformin and glipizide in the past.  Discussed importance of high protein and fiber diet for better blood sugar stabilization, avoiding simple carbohydrates and good exercise regimen.  Regarding her blood pressure, continue current regimen with close monitoring, DASH diet, stress reduction, exercise and closely follow-up with primary care.  Continue logging home readings and follow-up if symptoms worsening at any time.   Labs pending for further rule out given her vague generalized symptoms that she is complaining of as well.  Final Clinical Impressions(s) / UC Diagnoses   Final diagnoses:  Hyperglycemia  Other fatigue  Elevated blood pressure reading     Discharge Instructions      I have sent over a new diabetes medication to help better stabilize your blood sugar readings throughout the day.  Keep a log of your home readings and symptoms as well as your home blood pressures and follow-up as soon as possible with your primary care provider for a recheck of these things.  If your symptoms severely worsen at any time, go to the emergency department for further evaluation.    ED Prescriptions     Medication Sig Dispense Auth. Provider   dapagliflozin propanediol (FARXIGA) 5 MG TABS tablet Take 1 tablet (5 mg total) by mouth daily before breakfast. 30 tablet Volney American, [place]      PDMP not reviewed this encounter.   Volney American, [place] 06/01/22 1411

## 2022-06-01 NOTE — Discharge Instructions (Addendum)
I have sent over a new diabetes medication to help better stabilize your blood sugar readings throughout the day.  Keep a log of your home readings and symptoms as well as your home blood pressures and follow-up as soon as possible with your primary care provider for a recheck of these things.  If your symptoms severely worsen at any time, go to the emergency department for further evaluation.

## 2022-06-02 LAB — CBC WITH DIFFERENTIAL/PLATELET
Basophils Absolute: 0 10*3/uL (ref 0.0–0.2)
Basos: 0 %
EOS (ABSOLUTE): 0.2 10*3/uL (ref 0.0–0.4)
Eos: 4 %
Hematocrit: 42.9 % (ref 34.0–46.6)
Hemoglobin: 14.1 g/dL (ref 11.1–15.9)
Immature Grans (Abs): 0 10*3/uL (ref 0.0–0.1)
Immature Granulocytes: 0 %
Lymphocytes Absolute: 1.6 10*3/uL (ref 0.7–3.1)
Lymphs: 35 %
MCH: 26.7 pg (ref 26.6–33.0)
MCHC: 32.9 g/dL (ref 31.5–35.7)
MCV: 81 fL (ref 79–97)
Monocytes Absolute: 0.5 10*3/uL (ref 0.1–0.9)
Monocytes: 11 %
Neutrophils Absolute: 2.3 10*3/uL (ref 1.4–7.0)
Neutrophils: 50 %
Platelets: 319 10*3/uL (ref 150–450)
RBC: 5.28 x10E6/uL (ref 3.77–5.28)
RDW: 13.5 % (ref 11.7–15.4)
WBC: 4.6 10*3/uL (ref 3.4–10.8)

## 2022-06-02 LAB — COMPREHENSIVE METABOLIC PANEL
ALT: 12 IU/L (ref 0–32)
AST: 15 IU/L (ref 0–40)
Albumin/Globulin Ratio: 1.6 (ref 1.2–2.2)
Albumin: 4.5 g/dL (ref 3.9–4.9)
Alkaline Phosphatase: 147 IU/L — ABNORMAL HIGH (ref 44–121)
BUN/Creatinine Ratio: 10 — ABNORMAL LOW (ref 12–28)
BUN: 12 mg/dL (ref 8–27)
Bilirubin Total: 0.3 mg/dL (ref 0.0–1.2)
CO2: 20 mmol/L (ref 20–29)
Calcium: 9.9 mg/dL (ref 8.7–10.3)
Chloride: 105 mmol/L (ref 96–106)
Creatinine, Ser: 1.22 mg/dL — ABNORMAL HIGH (ref 0.57–1.00)
Globulin, Total: 2.8 g/dL (ref 1.5–4.5)
Glucose: 99 mg/dL (ref 70–99)
Potassium: 4.2 mmol/L (ref 3.5–5.2)
Sodium: 143 mmol/L (ref 134–144)
Total Protein: 7.3 g/dL (ref 6.0–8.5)
eGFR: 50 mL/min/{1.73_m2} — ABNORMAL LOW (ref >59)

## 2022-06-15 ENCOUNTER — Encounter: Payer: Self-pay | Admitting: Gastroenterology

## 2022-06-22 DIAGNOSIS — Z79899 Other long term (current) drug therapy: Secondary | ICD-10-CM | POA: Diagnosis not present

## 2022-06-22 DIAGNOSIS — E1129 Type 2 diabetes mellitus with other diabetic kidney complication: Secondary | ICD-10-CM | POA: Diagnosis not present

## 2022-06-22 DIAGNOSIS — N1832 Chronic kidney disease, stage 3b: Secondary | ICD-10-CM | POA: Diagnosis not present

## 2022-06-22 DIAGNOSIS — I5032 Chronic diastolic (congestive) heart failure: Secondary | ICD-10-CM | POA: Diagnosis not present

## 2022-06-30 DIAGNOSIS — Z23 Encounter for immunization: Secondary | ICD-10-CM | POA: Diagnosis not present

## 2022-06-30 DIAGNOSIS — R7309 Other abnormal glucose: Secondary | ICD-10-CM | POA: Diagnosis not present

## 2022-06-30 DIAGNOSIS — N1832 Chronic kidney disease, stage 3b: Secondary | ICD-10-CM | POA: Diagnosis not present

## 2022-06-30 DIAGNOSIS — E1122 Type 2 diabetes mellitus with diabetic chronic kidney disease: Secondary | ICD-10-CM | POA: Diagnosis not present

## 2022-06-30 DIAGNOSIS — I5032 Chronic diastolic (congestive) heart failure: Secondary | ICD-10-CM | POA: Diagnosis not present

## 2022-06-30 DIAGNOSIS — I10 Essential (primary) hypertension: Secondary | ICD-10-CM | POA: Diagnosis not present

## 2022-07-07 DIAGNOSIS — G2111 Neuroleptic induced parkinsonism: Secondary | ICD-10-CM | POA: Diagnosis not present

## 2022-07-07 DIAGNOSIS — R296 Repeated falls: Secondary | ICD-10-CM | POA: Diagnosis not present

## 2022-07-07 DIAGNOSIS — E1143 Type 2 diabetes mellitus with diabetic autonomic (poly)neuropathy: Secondary | ICD-10-CM | POA: Diagnosis not present

## 2022-07-07 DIAGNOSIS — I952 Hypotension due to drugs: Secondary | ICD-10-CM | POA: Diagnosis not present

## 2022-07-07 DIAGNOSIS — Z79899 Other long term (current) drug therapy: Secondary | ICD-10-CM | POA: Diagnosis not present

## 2022-07-14 ENCOUNTER — Telehealth: Payer: Self-pay

## 2022-07-14 NOTE — Telephone Encounter (Signed)
[facility] sent over documentation where the pt wants to switch her medications to their pharmacy. The only Rx she gets from [place] is Famotidine 40 mg and Pantoprazole Sodium DR 40 mg. Signed by Roseanne Kaufman and faxed back to [facility]. Sent to [place] for scan

## 2022-07-26 NOTE — Telephone Encounter (Signed)
Phoned to [facility] and spoke with Elmo Putt and advised her that we had received "several" request from them to have the pt's Famotidine and Pantoprazole sent through their pharmacy. The pt stated she wanted [facility] as her new pharmacy choice. This has been filled out and sent back to them 07-14-2022. She noted in the pt's record that this has been handled by [place] so we would not receive anymore documentation from them.

## 2022-07-29 DIAGNOSIS — K219 Gastro-esophageal reflux disease without esophagitis: Secondary | ICD-10-CM | POA: Diagnosis not present

## 2022-07-29 DIAGNOSIS — E1129 Type 2 diabetes mellitus with other diabetic kidney complication: Secondary | ICD-10-CM | POA: Diagnosis not present

## 2022-07-29 DIAGNOSIS — I10 Essential (primary) hypertension: Secondary | ICD-10-CM | POA: Diagnosis not present

## 2022-08-03 ENCOUNTER — Ambulatory Visit: Payer: Medicare Other | Admitting: Gastroenterology

## 2022-08-10 ENCOUNTER — Encounter: Payer: Self-pay | Admitting: Gastroenterology

## 2022-08-10 ENCOUNTER — Ambulatory Visit (INDEPENDENT_AMBULATORY_CARE_PROVIDER_SITE_OTHER): Payer: Medicare Other | Admitting: Gastroenterology

## 2022-08-10 VITALS — BP 162/81 | HR 51 | Temp 98.1°F | Ht 61.0 in | Wt 142.8 lb

## 2022-08-10 DIAGNOSIS — R7989 Other specified abnormal findings of blood chemistry: Secondary | ICD-10-CM | POA: Insufficient documentation

## 2022-08-10 DIAGNOSIS — K581 Irritable bowel syndrome with constipation: Secondary | ICD-10-CM | POA: Diagnosis not present

## 2022-08-10 DIAGNOSIS — K219 Gastro-esophageal reflux disease without esophagitis: Secondary | ICD-10-CM | POA: Diagnosis not present

## 2022-08-10 NOTE — Progress Notes (Signed)
GI Office Note    Referring Provider: [name], MD Primary Care Physician:  [name], MD  Primary Gastroenterologist: Garfield Cornea, MD   Chief Complaint   Chief Complaint  Patient presents with   Follow-up    Constipation is better, states amitiza was too strong.     History of Present Illness   Linda English is a 66 y.o. female presenting today for follow-up.  Last seen in May 2023.  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  GES normal.  Colonoscopy due 2026 for adenomatous colon polyps.  EGD in 2021 with normal-appearing esophagus status post empiric dilation with large bore [facility] dilator, provided good relief.  Right upper quadrant ultrasound 09/18/2020 with unremarkable liver, CBD 3 mm.  BMs doing better. Passing some stool most days but still struggles with incomplete stools. She did not like Amitiza due to issues with incontinence and no longer wants to take it. Benefiber at times. Ginger tea. Helping stomach. Reflux does ok but some days flares with trigger foods. No n/v. No abdominal pain.      Medications   Current Outpatient Medications  Medication Sig Dispense Refill   acetaminophen (TYLENOL) 500 MG tablet Take 1,000 mg by mouth every 8 (eight) hours as needed for moderate pain or headache.     clobetasol cream (TEMOVATE) 0.05 % Apply topically.     dapagliflozin propanediol (FARXIGA) 5 MG TABS tablet Take 1 tablet (5 mg total) by mouth daily before breakfast. 30 tablet 0   famotidine (PEPCID) 40 MG tablet TAKE 1 TABLET(40 MG) BY MOUTH AT BEDTIME  90 tablet 3   furosemide (LASIX) 40 MG tablet Take 1 tablet (40 mg total) by mouth daily. (Patient taking differently: Take 20 mg by mouth daily.) 30 tablet 1   lamoTRIgine (LAMICTAL) 200 MG tablet Take 200 mg by mouth 2 (two) times daily.     LORazepam (ATIVAN) 1 MG tablet Take 1 mg by mouth 3 (three) times daily as needed for anxiety.     losartan (COZAAR) 50 MG tablet Take 1 tablet by mouth at bedtime.     metoprolol succinate (TOPROL-XL) 50 MG 24 hr tablet Take 1 tablet (50 mg total) by mouth daily. Take with or immediately following a meal. 30 tablet 1   nystatin cream (MYCOSTATIN) Apply 1 Application topically 2 (two) times daily.     pantoprazole (PROTONIX) 40 MG tablet Take 1 tablet (40 mg total) by mouth daily. Take 30 minutes before breakfast 90 tablet 3   pravastatin (PRAVACHOL) 20 MG tablet Take 20 mg by mouth at bedtime.     pyridostigmine (MESTINON) 60 MG tablet Take 30 mg by mouth 3 (three) times daily.     triamcinolone cream (KENALOG) 0.1 % APPLY A THIN LAYER TO THE AFFECTED AREA(S) BY TOPICAL ROUTE 2 TIMES PER DAY     No current facility-administered medications for this visit.    Allergies   Allergies as of 08/10/2022 - Review Complete 08/10/2022  Allergen Reaction Noted   Lithium Nausea And Vomiting 06/27/2015   Excedrin extra strength [aspirin-acetaminophen-caffeine] Other (See Comments) 10/28/2014   Geodon [ziprasidone hcl]  05/01/2020   Klonopin [clonazepam]  Other (See Comments) 10/23/2019       Review of Systems   General: Negative for anorexia, weight loss, fever, chills, fatigue, weakness. ENT: Negative for hoarseness, difficulty swallowing , nasal congestion. CV: Negative for chest pain, angina, palpitations, dyspnea on exertion, peripheral edema.  Respiratory: Negative for dyspnea at rest, dyspnea on exertion, cough, sputum, wheezing.  GI: See history of present illness. GU:  Negative for dysuria, hematuria, urinary incontinence, urinary frequency,  nocturnal urination.  Endo: Negative for unusual weight change.     Physical Exam   BP (!) 162/81 (BP Location: Right Arm, Patient Position: Sitting, Cuff Size: Normal)   Pulse (!) 51   Temp 98.1 F (36.7 C) (Oral)   Ht _0  (1.549 m)   Wt 142 lb 12.8 oz (64.8 kg)   SpO2 100%   BMI 26.98 kg/m    General: Well-nourished, well-developed in no acute distress.  Eyes: No icterus. Mouth: Oropharyngeal mucosa moist and pink Abdomen: Bowel sounds are normal, nontender, nondistended, no hepatosplenomegaly or masses,  no abdominal bruits or hernia , no rebound or guarding.  Rectal: not performed Extremities: No lower extremity edema. No clubbing or deformities. Neuro: Alert and oriented x 4   Skin: Warm and dry, no jaundice.   Psych: Alert and cooperative, normal mood and affect.  Labs   Lab Results  Component Value Date   CREATININE 1.22 (H) 06/01/2022   BUN 12 06/01/2022   NA 143 06/01/2022   K 4.2 06/01/2022   CL 105 06/01/2022   CO2 20 06/01/2022   Lab Results  Component Value Date   ALT 12 06/01/2022   AST 15 06/01/2022   ALKPHOS 147 (H) 06/01/2022   BILITOT 0.3 06/01/2022   Lab Results  Component Value Date   WBC 4.6 06/01/2022   HGB 14.1 06/01/2022   HCT 42.9 06/01/2022   MCV 81 06/01/2022   PLT 319 06/01/2022   Lab Results  Component Value Date   IRON 79 04/02/2021   TIBC 281 04/02/2021   FERRITIN 126 04/02/2021   January 2022: Hepatitis B surface antigen negative, hepatitis B surface antibody negative, hepatitis B core antibody negative, hepatitis C antibody negative, hepatitis A total antibody negative  Imaging Studies   No results found.  Assessment   GERD: doing well, reinforced antireflux measures  IBS-C: doing reasonably well. She did not like Amitiza due to incontinence issues. Previously Linzess 8mg caused diarrhea. She is comfortable with current bowel regimen.   Elevated LFTs: chronic intermittent elevated alk phos.   The patient was  found to have elevated blood pressure when vital signs were checked in the office. The blood pressure was rechecked by the nursing staff and it was found be persistently elevated >140/90 mmHg. I personally advised to the patient to follow up closely with his PCP for hypertension control.   PLAN   Continue pantoprazole and Pepcid. Continue daily fiber supplement.  Labs to evaluate elevated alkphos.  LLaureen Ochs [facility]

## 2022-08-10 NOTE — Patient Instructions (Signed)
Continue pantoprazole and Pepcid daily. At your convenience, update your blood work. We will be in touch with results as available.

## 2022-08-11 LAB — COMPREHENSIVE METABOLIC PANEL
ALT: 16 IU/L (ref 0–32)
AST: 15 IU/L (ref 0–40)
Albumin/Globulin Ratio: 1.5 (ref 1.2–2.2)
Albumin: 4.1 g/dL (ref 3.9–4.9)
Alkaline Phosphatase: 128 IU/L — ABNORMAL HIGH (ref 44–121)
BUN/Creatinine Ratio: 11 — ABNORMAL LOW (ref 12–28)
BUN: 14 mg/dL (ref 8–27)
Bilirubin Total: 0.2 mg/dL (ref 0.0–1.2)
CO2: 24 mmol/L (ref 20–29)
Calcium: 9.9 mg/dL (ref 8.7–10.3)
Chloride: 104 mmol/L (ref 96–106)
Creatinine, Ser: 1.26 mg/dL — ABNORMAL HIGH (ref 0.57–1.00)
Globulin, Total: 2.8 g/dL (ref 1.5–4.5)
Glucose: 95 mg/dL (ref 70–99)
Potassium: 4.6 mmol/L (ref 3.5–5.2)
Sodium: 144 mmol/L (ref 134–144)
Total Protein: 6.9 g/dL (ref 6.0–8.5)
eGFR: 47 mL/min/{1.73_m2} — ABNORMAL LOW (ref >59)

## 2022-08-11 LAB — GAMMA GT: GGT: 30 IU/L (ref 0–60)

## 2022-08-11 LAB — MITOCHONDRIAL ANTIBODIES: Mitochondrial Ab: 21.1 Units — ABNORMAL HIGH (ref 0.0–20.0)

## 2022-08-16 ENCOUNTER — Encounter: Payer: Self-pay | Admitting: Neurology

## 2022-08-16 ENCOUNTER — Ambulatory Visit (INDEPENDENT_AMBULATORY_CARE_PROVIDER_SITE_OTHER): Payer: Medicare Other | Admitting: Neurology

## 2022-08-16 VITALS — BP 152/83 | HR 71 | Ht 61.0 in | Wt 140.0 lb

## 2022-08-16 DIAGNOSIS — R42 Dizziness and giddiness: Secondary | ICD-10-CM | POA: Diagnosis not present

## 2022-08-16 DIAGNOSIS — R269 Unspecified abnormalities of gait and mobility: Secondary | ICD-10-CM | POA: Diagnosis not present

## 2022-08-16 DIAGNOSIS — R4020 Unspecified coma: Secondary | ICD-10-CM | POA: Diagnosis not present

## 2022-08-16 DIAGNOSIS — R202 Paresthesia of skin: Secondary | ICD-10-CM | POA: Diagnosis not present

## 2022-08-16 NOTE — Progress Notes (Signed)
Chief Complaint  Patient presents with   New Patient (Initial Visit)    Rm 14. Alone. NP/Paper/[facility]/Kofi Doonquah MD/parkinsonism, mild cognitive impairment, epilepsy TOC.      ASSESSMENT AND PLAN  Linda English is a 66 y.o. female   Confusion spells, Dizziness  No orthostatic blood pressure change demonstrated.  In the setting of suboptimal control of her mood disorder, complains of auditory and visual hallucinations,  Emphasized importance compliant with her psychiatrist, keep lamotrigine 200 mg twice a day, laboratory evaluations, including lamotrigine level  EEG  Return To Clinic With NP In 6 Months if there is no significant abnormality on above workup, will discharge her to primary care and psychiatrist  DIAGNOSTIC DATA (LABS, IMAGING, TESTING) - I reviewed patient records, labs, notes, testing and imaging myself where available.   MEDICAL HISTORY:  Linda English is a 66 year old female, seen in request by primary care physician Dr., [name], to follow-up neurological condition, she was seen by [facility] neurologist Dr. Merlene Laughter, Trey Sailors, in the past  I reviewed and summarized the referring note. PMHx DM Bipolar HLD HTN Obstructive sleep apnea, could not tolerate CPAP, History of subdural hematoma in  Cervical decompression surgery, presenting to chronic neck pain, radiating pain to her left shoulder, gait abnormality Rectal cancer,   Lumbar decompression surgery. Hx of rectal cancer, s/p surgery 2003, and 2003, ileostomy and redo,  did not require radiation and chemo   She presented to [facility] emergency room on February 20, 2021 after fell at home that was witnessed by her daughter, she suffered long depression anxiety, the day of emergency presentation, she was rushing to the refrigerator end up fell, daughter and daughter witnessed the episode, reported patient was very anxious, impending  She also complains of intermittent low back  pain, as it knife is currently through her back, bilateral hands and feet paresthesia, difficult to keep her train of thoughts, short-term memory loss, frequent lightheadedness dizziness especially when standing up position,  She is already on lamotrigine 200 mg twice a day from her psychiatrist, missing her medication sometimes, complains of racing thoughts, auditory and visual hallucinations  PHYSICAL EXAM:   Vitals:   08/16/22 1106  BP: (!) 152/83  Pulse: 71  Weight: 140 lb (63.5 kg)  Height: '5\' 1"'$  (1.549 m)    Sitting down BP 180/100, HR 73; standing up 163/78, HR 66, standing up one minute, 173/83, HR 81,   Body mass index is 26.45 kg/m.  PHYSICAL EXAMNIATION:  Gen: NAD, conversant, well nourised, well groomed                     Cardiovascular: Regular rate rhythm, no peripheral edema, warm, nontender. Eyes: Conjunctivae clear without exudates or hemorrhage Neck: Supple, no carotid bruits. Pulmonary: Clear to auscultation bilaterally   NEUROLOGICAL EXAM:  MENTAL STATUS: Speech/cognition: Anxious looking middle age female,  alert, oriented to history taking and casual conversation     08/16/2022   11:11 AM  Montreal Cognitive Assessment   Visuospatial/ Executive (0/5) 2  Naming (0/3) 3  Attention: Read list of digits (0/2) 1  Attention: Read list of letters (0/1) 1  Attention: Serial 7 subtraction starting at 100 (0/3) 1  Language: Repeat phrase (0/2) 2  Language : Fluency (0/1) 1  Abstraction (0/2) 2  Delayed Recall (0/5) 5  Orientation (0/6) 6  Total 24  Adjusted Score (based on education) 24    CRANIAL NERVES: CN II: Visual fields are full to  confrontation. Pupils are round equal and briskly reactive to light. CN III, IV, VI: extraocular movement are normal. No ptosis. CN V: Facial sensation is intact to light touch CN VII: Face is symmetric with normal eye closure  CN VIII: Hearing is normal to causal conversation. CN IX, X: Phonation is normal. CN XI:  Head turning and shoulder shrug are intact  MOTOR: There is no pronator drift of out-stretched arms. Muscle bulk and tone are normal. Muscle strength is normal.  REFLEXES: Reflexes are 2+ and symmetric at the biceps, triceps, knees, and ankles. Plantar responses are flexor.  SENSORY: Intact to light touch, pinprick and vibratory sensation are intact in fingers and toes.  COORDINATION: There is no trunk or limb dysmetria noted.  GAIT/STANCE: Posture is normal. Gait was cautious Romberg is absent.  REVIEW OF SYSTEMS:  Full 14 system review of systems performed and notable only for as above All other review of systems were negative.   ALLERGIES: Allergies  Allergen Reactions   Lithium Nausea And Vomiting   Diltiazem Other (See Comments)    Low heart rate, extreme fatigue, pain   Excedrin Extra Strength [Aspirin-Acetaminophen-Caffeine] Other (See Comments)    Makes her nervous.   Geodon [Ziprasidone Hcl]     Neurologist discontinued d/t diagnosis   Klonopin [Clonazepam] Other (See Comments)    hallucinations    HOME MEDICATIONS: Current Outpatient Medications  Medication Sig Dispense Refill   acetaminophen (TYLENOL) 500 MG tablet Take 1,000 mg by mouth every 8 (eight) hours as needed for moderate pain or headache.     clobetasol cream (TEMOVATE) 0.05 % Apply topically.     dapagliflozin propanediol (FARXIGA) 5 MG TABS tablet Take 1 tablet (5 mg total) by mouth daily before breakfast. 30 tablet 0   famotidine (PEPCID) 40 MG tablet TAKE 1 TABLET(40 MG) BY MOUTH AT BEDTIME 90 tablet 3   furosemide (LASIX) 40 MG tablet Take 1 tablet (40 mg total) by mouth daily. (Patient taking differently: Take 20 mg by mouth daily.) 30 tablet 1   lamoTRIgine (LAMICTAL) 200 MG tablet Take 200 mg by mouth 2 (two) times daily.     LORazepam (ATIVAN) 1 MG tablet Take 1 mg by mouth 3 (three) times daily as needed for anxiety.     losartan (COZAAR) 50 MG tablet Take 1 tablet by mouth at bedtime.      metoprolol succinate (TOPROL-XL) 50 MG 24 hr tablet Take 1 tablet (50 mg total) by mouth daily. Take with or immediately following a meal. 30 tablet 1   nystatin cream (MYCOSTATIN) Apply 1 Application topically 2 (two) times daily.     pantoprazole (PROTONIX) 40 MG tablet Take 1 tablet (40 mg total) by mouth daily. Take 30 minutes before breakfast 90 tablet 3   pravastatin (PRAVACHOL) 20 MG tablet Take 20 mg by mouth at bedtime.     pyridostigmine (MESTINON) 60 MG tablet Take 30 mg by mouth 3 (three) times daily.     triamcinolone cream (KENALOG) 0.1 % APPLY A THIN LAYER TO THE AFFECTED AREA(S) BY TOPICAL ROUTE 2 TIMES PER DAY     No current facility-administered medications for this visit.    PAST MEDICAL HISTORY: Past Medical History:  Diagnosis Date   Acid reflux    Allergic rhinitis    Anxiety    Arthritis    osteoarthritis   Bipolar disorder ([place])    Chronic headache    Depression    Diverticula of colon    DM (diabetes mellitus) ([place])  Gastric erosions    Gastric polyps    benign    Hemorrhoids    Hiatal hernia    Hypertension    IBS (irritable bowel syndrome)    Lichen planus    Obstructive sleep apnea    Panic disorder    Parkinson's disease    Rectal cancer ([place]) 2003   ileostomy and reversal   Subdural hematoma ([facility])    Tubular adenoma     PAST SURGICAL HISTORY: Past Surgical History:  Procedure Laterality Date   ABDOMINAL HYSTERECTOMY     APPENDECTOMY     BACK SURGERY     BIOPSY  10/01/2019   Procedure: BIOPSY;  Surgeon: Daneil Dolin, MD;  [facility];  Service: Endoscopy;;  gastric   CESAREAN SECTION     X  2   CHOLECYSTECTOMY     COLONOSCOPY  08/2007   DR Gala Romney, friable anal canal hemorrhoids, surgical anastomosis at 3cm, distal scattered tics   COLONOSCOPY  12/15/2010   anal papilla and internal hemorrhoids/diminutive polyp in the base of the cecum. Past, tubular adenoma.. Next colonoscopy due in April 2017.   COLONOSCOPY   10/27/2005   RMR: Anal canal hemorrhoids. Surgical anastomosis at 3 cm from the anal verge appeared normal. Few scattered distal diverticula. The residual  colonic mucosa appeared normal. I suspect the patient bled from hemorrhoids   COLONOSCOPY N/A 01/15/2015   XFG:HWEXHB residual rectum and colon. next tcs 12/2019   COLONOSCOPY WITH PROPOFOL N/A 05/01/2020   Procedure: COLONOSCOPY WITH PROPOFOL;  Surgeon: Daneil Dolin, MD;  Two 3-8 millimeters polyps in ascending colon, diverticulosis in descending colon, surgical anastomosis at 4 cm, otherwise normal exam.  Pathology with tubular adenomas.  Repeat colonoscopy in 5 years.   ESOPHAGEAL DILATION N/A 06/27/2015   Procedure: ESOPHAGEAL DILATION;  Surgeon: Daneil Dolin, MD;  [facility];  Service: Endoscopy;  Laterality: N/A;   ESOPHAGOGASTRODUODENOSCOPY  05/2002   DR Gala Romney, normal   ESOPHAGOGASTRODUODENOSCOPY  08/03/2011   RMR: small HH/ gastric polyps   ESOPHAGOGASTRODUODENOSCOPY N/A 06/27/2015   Dr. Gala Romney: Mild erosive reflux esophagitits. Status post passage of a Maloney dilator. Hiatal hernia. Gastric polyps and abnormal gastric mucosa of doubtful clinical significane. status post biopsy, benign fundic gland polyp, negative H.pylori   ESOPHAGOGASTRODUODENOSCOPY (EGD) WITH PROPOFOL N/A 10/01/2019   rourk: Normal esophagus status post esophageal dilation for dysphagia, erythematous mucosa in the stomach biopsy consistent with reactive gastropathy, no H. pylori, multiple gastric polyps (biopsy fundic gland)   low anterior rection  2003   RECTAL CANCER   MALONEY DILATION N/A 10/01/2019   Procedure: MALONEY DILATION;  Surgeon: Daneil Dolin, MD;  [facility];  Service: Endoscopy;  Laterality: N/A;   POLYPECTOMY  05/01/2020   Procedure: POLYPECTOMY;  Surgeon: Daneil Dolin, MD;  [facility];  Service: Endoscopy;;   SPINE SURGERY  2001   L5,S1 HEMILAMINOTOMY AND DISCECTOMY/DR DEATON    FAMILY HISTORY: Family  History  Problem Relation Age of Onset   Colon cancer Paternal Grandfather 79   Colon polyps Brother    Colon polyps Cousin    Diabetes Mother    Hypertension Mother    Hypertension Father    Heart attack Father    Heart failure Father     SOCIAL HISTORY: Social History   Socioeconomic History   Marital status: Divorced    Spouse name: Not on file   Number of children: 2   Years of education: 77  Highest education level: 12th grade  Occupational History   Occupation: Product manager: RETIRED    Comment: [place]  Tobacco Use   Smoking status: Never    Passive exposure: Never   Smokeless tobacco: Never   Tobacco comments:    Never smoked  Vaping Use   Vaping Use: Never used  Substance and Sexual Activity   Alcohol use: No   Drug use: No   Sexual activity: Not Currently    Partners: Male  Other Topics Concern   Not on file  Social History Narrative   Not on file   Social Determinants of Health   Financial Resource Strain: Low Risk  (04/08/2022)   Overall Financial Resource Strain (CARDIA)    Difficulty of Paying Living Expenses: Not hard at all  Food Insecurity: No Food Insecurity (04/08/2022)   Hunger Vital Sign    Worried About Running Out of Food in the Last Year: Never true    Ran Out of Food in the Last Year: Never true  Transportation Needs: Unmet Transportation Needs (04/08/2022)   PRAPARE - Hydrologist (Medical): Yes    Lack of Transportation (Non-Medical): No  Physical Activity: Inactive (04/08/2022)   Exercise Vital Sign    Days of Exercise per Week: 0 days    Minutes of Exercise per Session: 0 min  Stress: No Stress Concern Present (04/08/2022)   [place]    Feeling of Stress : Only a little  Social Connections: Moderately Isolated (04/08/2022)   Social Connection and Isolation Panel [NHANES]    Frequency of Communication with Friends and Family:  More than three times a week    Frequency of Social Gatherings with Friends and Family: More than three times a week    Attends Religious Services: More than 4 times per year    Active Member of [employer] or Organizations: No    Attends Archivist Meetings: Never    Marital Status: Divorced  Human resources officer Violence: Not At Risk (04/08/2022)   Humiliation, Afraid, Rape, and Kick questionnaire    Fear of Current or Ex-Partner: No    Emotionally Abused: No    Physically Abused: No    Sexually Abused: No      Marcial Pacas, M.D. Ph.D.  [facility] [address] [place], [place] [address] Ph: [phone] Fax: [phone]  CC:  Phillips Odor, MD [address] [place],  [place] [address]  [name], MD

## 2022-08-18 LAB — LAMOTRIGINE LEVEL: Lamotrigine Lvl: 12.7 ug/mL (ref 2.0–20.0)

## 2022-08-18 LAB — TSH RFX ON ABNORMAL TO FREE T4: TSH: 1.31 u[IU]/mL (ref 0.450–4.500)

## 2022-08-31 ENCOUNTER — Telehealth: Payer: Self-pay | Admitting: "Endocrinology

## 2022-08-31 ENCOUNTER — Telehealth: Payer: Self-pay | Admitting: Internal Medicine

## 2022-08-31 NOTE — Telephone Encounter (Signed)
Patient is asking for an appt. What labs does patient need?

## 2022-08-31 NOTE — Telephone Encounter (Signed)
Instructed pt to contact Dr. Dorris Fetch regarding her elev kidney numbers. Pt verbalized understanding.

## 2022-08-31 NOTE — Telephone Encounter (Signed)
I have cc'ed labs to Dr Liliane Channel office twice

## 2022-08-31 NOTE — Telephone Encounter (Signed)
Pt called with concerns over her recent labs. She has questions for the nurse for reassurance. I told her that I forwarded her labs to Dr Dorris Fetch per her request. Please call [phone]

## 2022-09-01 ENCOUNTER — Other Ambulatory Visit: Payer: Self-pay

## 2022-09-01 DIAGNOSIS — R7989 Other specified abnormal findings of blood chemistry: Secondary | ICD-10-CM

## 2022-09-13 ENCOUNTER — Encounter: Payer: Self-pay | Admitting: "Endocrinology

## 2022-09-13 ENCOUNTER — Ambulatory Visit (INDEPENDENT_AMBULATORY_CARE_PROVIDER_SITE_OTHER): Payer: Medicare Other | Admitting: "Endocrinology

## 2022-09-13 VITALS — BP 146/90 | HR 68 | Ht 61.0 in | Wt 138.6 lb

## 2022-09-13 DIAGNOSIS — E119 Type 2 diabetes mellitus without complications: Secondary | ICD-10-CM

## 2022-09-13 DIAGNOSIS — I10 Essential (primary) hypertension: Secondary | ICD-10-CM | POA: Diagnosis not present

## 2022-09-13 DIAGNOSIS — E782 Mixed hyperlipidemia: Secondary | ICD-10-CM

## 2022-09-13 LAB — POCT GLYCOSYLATED HEMOGLOBIN (HGB A1C): HbA1c, POC (controlled diabetic range): 5.9 % (ref 0.0–7.0)

## 2022-09-13 NOTE — Patient Instructions (Signed)
                                     Advice for Weight Management  -For most of us the best way to lose weight is by diet management. Generally speaking, diet management means consuming less calories intentionally which over time brings about progressive weight loss.  This can be achieved more effectively by avoiding ultra processed carbohydrates, processed meats, unhealthy fats.    It is critically important to know your numbers: how much calorie you are consuming and how much calorie you need. More importantly, our carbohydrates sources should be unprocessed naturally occurring  complex starch food items.  It is always important to balance nutrition also by  appropriate intake of proteins (mainly plant-based), healthy fats/oils, plenty of fruits and vegetables.   -The American College of Lifestyle Medicine (ACL M) recommends nutrition derived mostly from Whole Food, Plant Predominant Sources example an apple instead of applesauce or apple pie. Eat Plenty of vegetables, Mushrooms, fruits, Legumes, Whole Grains, Nuts, seeds in lieu of processed meats, processed snacks/pastries red meat, poultry, eggs.  Use only water or unsweetened tea for hydration.  The College also recommends the need to stay away from risky substances including alcohol, smoking; obtaining 7-9 hours of restorative sleep, at least 150 minutes of moderate intensity exercise weekly, importance of healthy social connections, and being mindful of stress and seek help when it is overwhelming.    -Sticking to a routine mealtime to eat 3 meals a day and avoiding unnecessary snacks is shown to have a big role in weight control. Under normal circumstances, the only time we burn stored energy is when we are hungry, so allow  some hunger to take place- hunger means no food between appropriate meal times, only water.  It is not advisable to starve.   -It is better to avoid simple carbohydrates including:  Cakes, Sweet Desserts, Ice Cream, Soda (diet and regular), Sweet Tea, Candies, Chips, Cookies, Store Bought Juices, Alcohol in Excess of  1-2 drinks a day, Lemonade,  Artificial Sweeteners, Doughnuts, Coffee Creamers, "Sugar-free" Products, etc, etc.  This is not a complete list.....    -Consulting with certified diabetes educators is proven to provide you with the most accurate and current information on diet.  Also, you may be  interested in discussing diet options/exchanges , we can schedule a visit with Linda English, RDN, CDE for individualized nutrition education.  -Exercise: If you are able: 30 -60 minutes a day ,4 days a week, or 150 minutes of moderate intensity exercise weekly.    The longer the better if tolerated.  Combine stretch, strength, and aerobic activities.  If you were told in the past that you have high risk for cardiovascular diseases, or if you are currently symptomatic, you may seek evaluation by your heart doctor prior to initiating moderate to intense exercise programs.                                  Additional Care Considerations for Diabetes/Prediabetes   -Diabetes  is a chronic disease.  The most important care consideration is regular follow-up with your diabetes care provider with the goal being avoiding or delaying its complications and to take advantage of advances in medications and technology.  If appropriate actions are taken early enough, type 2 diabetes can even be   reversed.  Seek information from the right source.  - Whole Food, Plant Predominant Nutrition is highly recommended: Eat Plenty of vegetables, Mushrooms, fruits, Legumes, Whole Grains, Nuts, seeds in lieu of processed meats, processed snacks/pastries red meat, poultry, eggs as recommended by American College of  Lifestyle Medicine (ACLM).  -Type 2 diabetes is known to coexist with other important comorbidities such as high blood pressure and high cholesterol.  It is critical to control not only the  diabetes but also the high blood pressure and high cholesterol to minimize and delay the risk of complications including coronary artery disease, stroke, amputations, blindness, etc.  The good news is that this diet recommendation for type 2 diabetes is also very helpful for managing high cholesterol and high blood blood pressure.  - Studies showed that people with diabetes will benefit from a class of medications known as ACE inhibitors and statins.  Unless there are specific reasons not to be on these medications, the standard of care is to consider getting one from these groups of medications at an optimal doses.  These medications are generally considered safe and proven to help protect the heart and the kidneys.    - People with diabetes are encouraged to initiate and maintain regular follow-up with eye doctors, foot doctors, dentists , and if necessary heart and kidney doctors.     - It is highly recommended that people with diabetes quit smoking or stay away from smoking, and get yearly  flu vaccine and pneumonia vaccine at least every 5 years.  See above for additional recommendations on exercise, sleep, stress management , and healthy social connections.      

## 2022-09-13 NOTE — Progress Notes (Signed)
Endocrinology Consult Note                                            09/13/2022, 6:27 PM   Subjective:    Patient ID: Linda English, female    DOB: 1956/03/01, PCP [name], MD   Past Medical History:  Diagnosis Date   Acid reflux    Allergic rhinitis    Anxiety    Arthritis    osteoarthritis   Bipolar disorder ([place])    Chronic headache    Depression    Diverticula of colon    DM (diabetes mellitus) ([place])    Gastric erosions    Gastric polyps    benign    Hemorrhoids    Hiatal hernia    Hypertension    IBS (irritable bowel syndrome)    Lichen planus    Obstructive sleep apnea    Panic disorder    Parkinson's disease    Rectal cancer ([place]) 2003   ileostomy and reversal   Subdural hematoma ([place])    Tubular adenoma    Past Surgical History:  Procedure Laterality Date   ABDOMINAL HYSTERECTOMY     APPENDECTOMY     BACK SURGERY     BIOPSY  10/01/2019   Procedure: BIOPSY;  Surgeon: Daneil Dolin, MD;  [facility];  Service: Endoscopy;;  gastric   CESAREAN SECTION     X  2   CHOLECYSTECTOMY     COLONOSCOPY  08/2007   DR Gala Romney, friable anal canal hemorrhoids, surgical anastomosis at 3cm, distal scattered tics   COLONOSCOPY  12/15/2010   anal papilla and internal hemorrhoids/diminutive polyp in the base of the cecum. Past, tubular adenoma.. Next colonoscopy due in April 2017.   COLONOSCOPY  10/27/2005   RMR: Anal canal hemorrhoids. Surgical anastomosis at 3 cm from the anal verge appeared normal. Few scattered distal diverticula. The residual  colonic mucosa appeared normal. I suspect the patient bled from hemorrhoids   COLONOSCOPY N/A 01/15/2015   ZHG:DJMEQA residual rectum and colon. next tcs 12/2019   COLONOSCOPY WITH PROPOFOL N/A 05/01/2020   Procedure: COLONOSCOPY WITH PROPOFOL;  Surgeon: Daneil Dolin, MD;  Two 3-8 millimeters polyps in ascending colon, diverticulosis in descending colon, surgical anastomosis at 4 cm, otherwise normal  exam.  Pathology with tubular adenomas.  Repeat colonoscopy in 5 years.   ESOPHAGEAL DILATION N/A 06/27/2015   Procedure: ESOPHAGEAL DILATION;  Surgeon: Daneil Dolin, MD;  [facility];  Service: Endoscopy;  Laterality: N/A;   ESOPHAGOGASTRODUODENOSCOPY  05/2002   DR Gala Romney, normal   ESOPHAGOGASTRODUODENOSCOPY  08/03/2011   RMR: small HH/ gastric polyps   ESOPHAGOGASTRODUODENOSCOPY N/A 06/27/2015   Dr. Gala Romney: Mild erosive reflux esophagitits. Status post passage of a Maloney dilator. Hiatal hernia. Gastric polyps and abnormal gastric mucosa of doubtful clinical significane. status post biopsy, benign fundic gland polyp, negative H.pylori   ESOPHAGOGASTRODUODENOSCOPY (EGD) WITH PROPOFOL N/A 10/01/2019   rourk: Normal esophagus status post esophageal dilation for dysphagia, erythematous mucosa in the stomach biopsy consistent with reactive gastropathy, no H. pylori, multiple gastric polyps (biopsy fundic gland)   low anterior rection  2003   RECTAL CANCER   MALONEY DILATION N/A 10/01/2019   Procedure: MALONEY DILATION;  Surgeon: Daneil Dolin, MD;  [facility];  Service: Endoscopy;  Laterality: N/A;  POLYPECTOMY  05/01/2020   Procedure: POLYPECTOMY;  Surgeon: Daneil Dolin, MD;  [facility];  Service: Endoscopy;;   SPINE SURGERY  2001   L5,S1 HEMILAMINOTOMY AND DISCECTOMY/DR DEATON   Social History   Socioeconomic History   Marital status: Divorced    Spouse name: Not on file   Number of children: 2   Years of education: 12   Highest education level: 12th grade  Occupational History   Occupation: Product manager: RETIRED    Comment: [employer]  Tobacco Use   Smoking status: Never    Passive exposure: Never   Smokeless tobacco: Never   Tobacco comments:    Never smoked  Vaping Use   Vaping Use: Never used  Substance and Sexual Activity   Alcohol use: No   Drug use: No   Sexual activity: Not Currently    Partners: Male  Other Topics  Concern   Not on file  Social History Narrative   Not on file   Social Determinants of Health   Financial Resource Strain: Low Risk  (04/08/2022)   Overall Financial Resource Strain (CARDIA)    Difficulty of Paying Living Expenses: Not hard at all  Food Insecurity: No Food Insecurity (04/08/2022)   Hunger Vital Sign    Worried About Running Out of Food in the Last Year: Never true    Ran Out of Food in the Last Year: Never true  Transportation Needs: Unmet Transportation Needs (04/08/2022)   PRAPARE - Hydrologist (Medical): Yes    Lack of Transportation (Non-Medical): No  Physical Activity: Inactive (04/08/2022)   Exercise Vital Sign    Days of Exercise per Week: 0 days    Minutes of Exercise per Session: 0 min  Stress: No Stress Concern Present (04/08/2022)   [place]    Feeling of Stress : Only a little  Social Connections: Moderately Isolated (04/08/2022)   Social Connection and Isolation Panel [NHANES]    Frequency of Communication with Friends and Family: More than three times a week    Frequency of Social Gatherings with Friends and Family: More than three times a week    Attends Religious Services: More than 4 times per year    Active Member of [employer] or Organizations: No    Attends Archivist Meetings: Never    Marital Status: Divorced   Family History  Problem Relation Age of Onset   Colon cancer Paternal Grandfather 28   Colon polyps Brother    Colon polyps Cousin    Diabetes Mother    Hypertension Mother    Hypertension Father    Heart attack Father    Heart failure Father    Outpatient Encounter Medications as of 09/13/2022  Medication Sig   acetaminophen (TYLENOL) 500 MG tablet Take 1,000 mg by mouth every 8 (eight) hours as needed for moderate pain or headache.   clobetasol cream (TEMOVATE) 0.05 % Apply topically.   dapagliflozin propanediol (FARXIGA) 5 MG  TABS tablet Take 1 tablet (5 mg total) by mouth daily before breakfast.   famotidine (PEPCID) 40 MG tablet TAKE 1 TABLET(40 MG) BY MOUTH AT BEDTIME   furosemide (LASIX) 40 MG tablet Take 1 tablet (40 mg total) by mouth daily. (Patient taking differently: Take 20 mg by mouth daily.)   lamoTRIgine (LAMICTAL) 200 MG tablet Take 200 mg by mouth 2 (two) times daily.   LORazepam (ATIVAN) 1 MG  tablet Take 1 mg by mouth 3 (three) times daily as needed for anxiety.   losartan (COZAAR) 50 MG tablet Take 1 tablet by mouth at bedtime.   metoprolol succinate (TOPROL-XL) 50 MG 24 hr tablet Take 1 tablet (50 mg total) by mouth daily. Take with or immediately following a meal.   nystatin cream (MYCOSTATIN) Apply 1 Application topically 2 (two) times daily.   pantoprazole (PROTONIX) 40 MG tablet Take 1 tablet (40 mg total) by mouth daily. Take 30 minutes before breakfast   pravastatin (PRAVACHOL) 20 MG tablet Take 20 mg by mouth at bedtime.   pyridostigmine (MESTINON) 60 MG tablet Take 30 mg by mouth 3 (three) times daily. (Patient not taking: Reported on 09/13/2022)   triamcinolone cream (KENALOG) 0.1 % APPLY A THIN LAYER TO THE AFFECTED AREA(S) BY TOPICAL ROUTE 2 TIMES PER DAY   No facility-administered encounter medications on file as of 09/13/2022.   ALLERGIES: Allergies  Allergen Reactions   Lithium Nausea And Vomiting   Diltiazem Other (See Comments)    Low heart rate, extreme fatigue, pain   Excedrin Extra Strength [Aspirin-Acetaminophen-Caffeine] Other (See Comments)    Makes her nervous.   Geodon [Ziprasidone Hcl]     Neurologist discontinued d/t diagnosis   Klonopin [Clonazepam] Other (See Comments)    hallucinations    VACCINATION STATUS: Immunization History  Administered Date(s) Administered   Influenza,inj,Quad PF,6+ Mos 06/25/2019   Pneumococcal Polysaccharide-23 08/13/2019    HPI Linda English is 67 y.o. female who presents today with a medical history as above. she is  being seen in consultation for controlled type 2 diabetes.  This patient was previously seen in this practice for mild hypercortisolemia which did not show any evidence of Cushing syndrome.  She did have prediabetes at that time.  She returns for follow-up, point-of-care A1c today is 5.9%.  She remains on Farxiga 5 mg p.o. daily at breakfast. 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 Uncontrolled blood pressure currently on multiple medications including losartan 50 mg p.o. daily, Lasix 20 mg p.o. daily, metoprolol 50 mg p.o. daily.   Review of Systems  Constitutional: no recent weight gain/loss, no fatigue, no subjective hyperthermia, no subjective hypothermia Eyes: no blurry vision, no xerophthalmia ENT: no sore throat, no nodules palpated in throat, no dysphagia/odynophagia, no hoarseness Cardiovascular: no Chest Pain, no Shortness of Breath, no palpitations, no leg swelling Respiratory: no cough, no shortness of breath Gastrointestinal: no Nausea/Vomiting/Diarhhea Musculoskeletal: no muscle/joint aches Skin: no rashes Neurological: no tremors, no numbness, no tingling, no dizziness Psychiatric: no depression, no anxiety  Objective:       09/13/2022   10:44 AM 08/16/2022   11:06 AM 08/10/2022    3:03 PM  Vitals with BMI  Height '5\' 1"'$  '5\' 1"'$    Weight 138 lbs 10 oz 140 lbs   BMI 42.7 06.23   Systolic 762 831 517  Diastolic 90 83 81  Pulse 68 71 51    BP (!) 146/90 Comment: 142/86 R arm. Dr.Madge Therrien made aware  Pulse 68   Ht '5\' 1"'$  (1.549 m)   Wt 138 lb 9.6 oz (62.9 kg)   BMI 26.19 kg/m   Wt Readings from Last 3 Encounters:  09/13/22 138 lb 9.6 oz (62.9 kg)  08/16/22 140 lb (63.5 kg)  08/10/22 142 lb 12.8 oz (64.8 kg)    Physical Exam  Constitutional:  Body mass index is 26.19 kg/m.,  not in acute distress, normal state of mind Eyes:  PERRLA, EOMI, no exophthalmos ENT: moist mucous membranes, no gross thyromegaly, no gross cervical  lymphadenopathy Cardiovascular: normal precordial activity, Regular Rate and Rhythm, no Murmur/Rubs/Gallops Respiratory:  adequate breathing efforts, no gross chest deformity, Clear to auscultation bilaterally Gastrointestinal: abdomen soft, Non -tender, No distension, Bowel Sounds present, no gross organomegaly Musculoskeletal: no gross deformities, strength intact in all four extremities Skin: moist, warm, no rashes Neurological: no tremor with outstretched hands, Deep tendon reflexes normal in bilateral lower extremities.  CMP ( most recent) CMP     Component Value Date/Time   NA 144 08/10/2022 1522   K 4.6 08/10/2022 1522   K 4.6 06/26/2010 1400   CL 104 08/10/2022 1522   CO2 24 08/10/2022 1522   GLUCOSE 95 08/10/2022 1522   GLUCOSE 213 (H) 02/20/2021 1039   BUN 14 08/10/2022 1522   CREATININE 1.26 (H) 08/10/2022 1522   CREATININE 1.21 (H) 08/11/2020 1400   CALCIUM 9.9 08/10/2022 1522   PROT 6.9 08/10/2022 1522   ALBUMIN 4.1 08/10/2022 1522   AST 15 08/10/2022 1522   AST 17 06/26/2010 1400   ALT 16 08/10/2022 1522   ALKPHOS 128 (H) 08/10/2022 1522   ALKPHOS 92 06/26/2010 1400   BILITOT 0.2 08/10/2022 1522   BILITOT 0.3 06/26/2010 1400   GFRNONAA 43 (L) 02/20/2021 1039   GFRNONAA 48 (L) 08/11/2020 1400   GFRAA 55 (L) 08/11/2020 1400   GFRAA 55 (L) 08/11/2020 1315     Diabetic Labs (most recent): Lab Results  Component Value Date   HGBA1C 5.9 09/13/2022   HGBA1C 5.0 01/17/2020   HGBA1C 5.8 (H) 10/23/2019     Lipid Panel ( most recent) Lipid Panel     Component Value Date/Time   CHOL 159 08/10/2015 0621   TRIG 188 (H) 08/10/2015 0621   HDL 53 08/10/2015 0621   CHOLHDL 3.0 08/10/2015 0621   VLDL 38 08/10/2015 0621   LDLCALC 68 08/10/2015 0621      Lab Results  Component Value Date   TSH 1.310 08/16/2022   TSH 2.230 01/18/2020   TSH 2.048 10/26/2019   TSH 2.213 10/03/2013   TSH 1.312 04/27/2012   FREET4 1.03 01/18/2020   FREET4 1.41 (H) 10/26/2019    FREET4 1.20 04/27/2012           Assessment & Plan:   1. Type 2 diabetes mellitus without complication, without long-term current use of insulin ([place]) 2. Essential hypertension, benign 3. Mixed hyperlipidemia  - Linda English  is being seen at a kind request of [name], MD. - I have reviewed her available endocrine records and clinically evaluated the patient. - Based on these reviews, she has well-controlled type 2 diabetes.  She will not need additional intervention, advised to continue Farxiga 5 mg p.o. daily at breakfast. In light of her CKD, she is advised to control blood pressure by taking her medications consistently in the morning.  She has Toprol 50 mg p.o. daily, losartan 50 mg p.o. daily, and Lasix as needed. She is advised to continue pravastatin 20 mg p.o. nightly for hyperlipidemia.  - I did not initiate any new prescriptions today. - she is advised to maintain close follow up with [name], MD for primary care needs.   - Time spent with the patient: 45 minutes, of which >50% was spent in  counseling her about her controlled diabetes type 2, hyperlipidemia, hypertension and the rest in obtaining information about her symptoms, reviewing her previous labs/studies ( including abstractions from other facilities),  evaluations, and treatments,  and developing a plan to confirm diagnosis and long term treatment based on the latest standards of care/guidelines; and documenting her care.  Linda English participated in the discussions, expressed understanding, and voiced agreement with the above plans.  All questions were answered to her satisfaction. she is encouraged to contact clinic should she have any questions or concerns prior to her return visit.  Follow up plan: Return in about 6 months (around 03/14/2023) for Fasting Labs  in AM B4 8, A1c -[place].   Glade Lloyd, MD [facility] [address] [place], [place] [address] Phone: [phone]  Fax: [phone]     09/13/2022, 6:27 PM  This note was partially dictated with voice recognition software. Similar sounding words can be transcribed inadequately or may not  be corrected upon review.

## 2022-09-14 ENCOUNTER — Ambulatory Visit (INDEPENDENT_AMBULATORY_CARE_PROVIDER_SITE_OTHER): Payer: Medicare Other | Admitting: Neurology

## 2022-09-14 DIAGNOSIS — R410 Disorientation, unspecified: Secondary | ICD-10-CM | POA: Diagnosis not present

## 2022-09-14 DIAGNOSIS — R202 Paresthesia of skin: Secondary | ICD-10-CM

## 2022-09-14 DIAGNOSIS — R269 Unspecified abnormalities of gait and mobility: Secondary | ICD-10-CM

## 2022-09-27 ENCOUNTER — Telehealth: Payer: Self-pay | Admitting: Neurology

## 2022-09-27 NOTE — Telephone Encounter (Signed)
Pt is asking to be called with EEG results as soon as possible.

## 2022-09-27 NOTE — Telephone Encounter (Signed)
Called pt and LVM that the RN stated that the results have not come in yet but as soon as they are received they will be calling her with the results.

## 2022-09-29 NOTE — Procedures (Signed)
   HISTORY: 67 year old female with confusion spells  TECHNIQUE:  This is a routine 16 channel EEG recording with one channel devoted to a limited EKG recording.  It was performed during wakefulness, drowsiness and asleep.  Hyperventilation and photic stimulation were performed as activating procedures.  There are minimum muscle and movement artifact noted.  Upon maximum arousal, posterior dominant waking rhythm consistent of rhythmic alpha range activity. Activities are symmetric over the bilateral posterior derivations and attenuated with eye opening.  Hyperventilation produced mild/moderate buildup with higher amplitude and the slower activities noted.  Photic stimulation did not alter the tracing.  During EEG recording, patient developed drowsiness and no deeper stage of sleep was achieved During EEG recording, there was no epileptiform discharge noted.  EKG demonstrate normal sinus rhythm.  CONCLUSION: This is a  normal awake awake EEG.  There is no electrodiagnostic evidence of epileptiform discharge.  Marcial Pacas, M.D. Ph.D.  [facility] [place], [place] [address] Phone: [phone] Fax:      [phone]

## 2022-10-08 DIAGNOSIS — N1832 Chronic kidney disease, stage 3b: Secondary | ICD-10-CM | POA: Diagnosis not present

## 2022-10-08 DIAGNOSIS — Z79899 Other long term (current) drug therapy: Secondary | ICD-10-CM | POA: Diagnosis not present

## 2022-10-08 DIAGNOSIS — I5032 Chronic diastolic (congestive) heart failure: Secondary | ICD-10-CM | POA: Diagnosis not present

## 2022-10-08 DIAGNOSIS — E1129 Type 2 diabetes mellitus with other diabetic kidney complication: Secondary | ICD-10-CM | POA: Diagnosis not present

## 2022-10-11 DIAGNOSIS — E119 Type 2 diabetes mellitus without complications: Secondary | ICD-10-CM | POA: Diagnosis not present

## 2022-10-12 LAB — COMPREHENSIVE METABOLIC PANEL
ALT: 14 IU/L (ref 0–32)
AST: 18 IU/L (ref 0–40)
Albumin/Globulin Ratio: 1.8 (ref 1.2–2.2)
Albumin: 4.2 g/dL (ref 3.9–4.9)
Alkaline Phosphatase: 124 IU/L — ABNORMAL HIGH (ref 44–121)
BUN/Creatinine Ratio: 14 (ref 12–28)
BUN: 18 mg/dL (ref 8–27)
Bilirubin Total: <0.2 mg/dL (ref 0.0–1.2)
CO2: 21 mmol/L (ref 20–29)
Calcium: 9.5 mg/dL (ref 8.7–10.3)
Chloride: 105 mmol/L (ref 96–106)
Creatinine, Ser: 1.25 mg/dL — ABNORMAL HIGH (ref 0.57–1.00)
Globulin, Total: 2.4 g/dL (ref 1.5–4.5)
Glucose: 109 mg/dL — ABNORMAL HIGH (ref 70–99)
Potassium: 3.8 mmol/L (ref 3.5–5.2)
Sodium: 144 mmol/L (ref 134–144)
Total Protein: 6.6 g/dL (ref 6.0–8.5)
eGFR: 48 mL/min/{1.73_m2} — ABNORMAL LOW (ref >59)

## 2022-10-12 LAB — LIPID PANEL
Chol/HDL Ratio: 2.3 ratio (ref 0.0–4.4)
Cholesterol, Total: 142 mg/dL (ref 100–199)
HDL: 63 mg/dL (ref >39)
LDL Chol Calc (NIH): 64 mg/dL (ref 0–99)
Triglycerides: 79 mg/dL (ref 0–149)
VLDL Cholesterol Cal: 15 mg/dL (ref 5–40)

## 2022-10-12 LAB — CORTISOL-AM, BLOOD: Cortisol - AM: 23.5 ug/dL — ABNORMAL HIGH (ref 6.2–19.4)

## 2022-10-26 DIAGNOSIS — I5032 Chronic diastolic (congestive) heart failure: Secondary | ICD-10-CM | POA: Diagnosis not present

## 2022-10-26 DIAGNOSIS — N1832 Chronic kidney disease, stage 3b: Secondary | ICD-10-CM | POA: Diagnosis not present

## 2022-10-26 DIAGNOSIS — E1122 Type 2 diabetes mellitus with diabetic chronic kidney disease: Secondary | ICD-10-CM | POA: Diagnosis not present

## 2022-10-26 DIAGNOSIS — I10 Essential (primary) hypertension: Secondary | ICD-10-CM | POA: Diagnosis not present

## 2022-10-26 DIAGNOSIS — R7309 Other abnormal glucose: Secondary | ICD-10-CM | POA: Diagnosis not present

## 2022-11-22 ENCOUNTER — Encounter (INDEPENDENT_AMBULATORY_CARE_PROVIDER_SITE_OTHER): Payer: Medicare Other | Admitting: Ophthalmology

## 2022-11-22 DIAGNOSIS — I10 Essential (primary) hypertension: Secondary | ICD-10-CM | POA: Diagnosis not present

## 2022-11-22 DIAGNOSIS — H35033 Hypertensive retinopathy, bilateral: Secondary | ICD-10-CM | POA: Diagnosis not present

## 2022-11-22 DIAGNOSIS — D3132 Benign neoplasm of left choroid: Secondary | ICD-10-CM

## 2022-11-22 DIAGNOSIS — H43813 Vitreous degeneration, bilateral: Secondary | ICD-10-CM | POA: Diagnosis not present

## 2022-11-22 DIAGNOSIS — H35372 Puckering of macula, left eye: Secondary | ICD-10-CM

## 2022-12-03 DIAGNOSIS — Z1231 Encounter for screening mammogram for malignant neoplasm of breast: Secondary | ICD-10-CM | POA: Diagnosis not present

## 2023-01-21 DIAGNOSIS — N1832 Chronic kidney disease, stage 3b: Secondary | ICD-10-CM | POA: Diagnosis not present

## 2023-01-21 DIAGNOSIS — I5032 Chronic diastolic (congestive) heart failure: Secondary | ICD-10-CM | POA: Diagnosis not present

## 2023-01-21 DIAGNOSIS — I10 Essential (primary) hypertension: Secondary | ICD-10-CM | POA: Diagnosis not present

## 2023-01-21 DIAGNOSIS — E1129 Type 2 diabetes mellitus with other diabetic kidney complication: Secondary | ICD-10-CM | POA: Diagnosis not present

## 2023-01-28 DIAGNOSIS — I10 Essential (primary) hypertension: Secondary | ICD-10-CM | POA: Diagnosis not present

## 2023-01-28 DIAGNOSIS — N1832 Chronic kidney disease, stage 3b: Secondary | ICD-10-CM | POA: Diagnosis not present

## 2023-01-28 DIAGNOSIS — E1122 Type 2 diabetes mellitus with diabetic chronic kidney disease: Secondary | ICD-10-CM | POA: Diagnosis not present

## 2023-01-28 DIAGNOSIS — R739 Hyperglycemia, unspecified: Secondary | ICD-10-CM | POA: Diagnosis not present

## 2023-01-28 DIAGNOSIS — I5032 Chronic diastolic (congestive) heart failure: Secondary | ICD-10-CM | POA: Diagnosis not present

## 2023-02-03 ENCOUNTER — Encounter: Payer: Self-pay | Admitting: Internal Medicine

## 2023-02-14 DIAGNOSIS — M4802 Spinal stenosis, cervical region: Secondary | ICD-10-CM | POA: Diagnosis not present

## 2023-02-14 DIAGNOSIS — M5412 Radiculopathy, cervical region: Secondary | ICD-10-CM | POA: Diagnosis not present

## 2023-02-14 DIAGNOSIS — M25532 Pain in left wrist: Secondary | ICD-10-CM | POA: Diagnosis not present

## 2023-02-14 DIAGNOSIS — M25531 Pain in right wrist: Secondary | ICD-10-CM | POA: Diagnosis not present

## 2023-02-17 ENCOUNTER — Other Ambulatory Visit: Payer: Self-pay

## 2023-02-17 DIAGNOSIS — R7989 Other specified abnormal findings of blood chemistry: Secondary | ICD-10-CM

## 2023-02-22 ENCOUNTER — Telehealth: Payer: Self-pay | Admitting: "Endocrinology

## 2023-02-22 DIAGNOSIS — E119 Type 2 diabetes mellitus without complications: Secondary | ICD-10-CM

## 2023-02-22 DIAGNOSIS — E782 Mixed hyperlipidemia: Secondary | ICD-10-CM

## 2023-02-22 DIAGNOSIS — I10 Essential (primary) hypertension: Secondary | ICD-10-CM

## 2023-02-22 NOTE — Telephone Encounter (Signed)
Lab orders updated and sent to [facility]. 

## 2023-02-22 NOTE — Telephone Encounter (Signed)
Pt needs new lab orders put in

## 2023-02-24 NOTE — Patient Instructions (Signed)
Below is our plan:  We will continue to monitor. Please call your PCP asap to discuss your blood pressure management plan. Please take your blood pressure medication as soon as you get home.   Please make sure you are staying well hydrated. I recommend 50-60 ounces daily. Well balanced diet and regular exercise encouraged. Consistent sleep schedule with 6-8 hours recommended.   Please continue follow up with care team as directed. Follow up as needed.   You may receive a survey regarding today's visit. I encourage you to leave honest feed back as I do use this information to improve patient care. Thank you for seeing me today!

## 2023-02-24 NOTE — Progress Notes (Signed)
Chief Complaint  Patient presents with   Follow-up    Rm emg 2, alone.  Is better.  Twice had frozen episode. (Stumbling).      HISTORY OF PRESENT ILLNESS:  03/07/23 ALL:  Linda English is a 67 y.o. female here today for follow up for episodes of confusion and dizziness. She was seen in consult with Dr Terrace Arabia 07/2022. EEG and labs normal. She reports doing well. She denies episodes of confusion but does report an event last week where she felt that she was frozen for about 30 minutes. She was walking down stairs and suddenly was unable to move her legs. Her family met her on the stairs and she was able to walk to the car. 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 She was aware of her surroundings. She feels mood is stable. She is sleeping well. She continues lamotrigine per psychiatry. BP is usually 140-150/90's. She admits she has not taken her HTN meds, today.    HISTORY (copied from Dr Zannie Cove previous note)  Linda English is a 67 year old female, seen in request by primary care physician Dr., Carylon Perches, to follow-up neurological condition, she was seen by [facility] neurologist Dr. Gerilyn Pilgrim, Darleen Crocker, in the past   I reviewed and summarized the referring note. PMHx DM Bipolar HLD HTN Obstructive sleep apnea, could not tolerate CPAP, History of subdural hematoma in  Cervical decompression surgery, presenting to chronic neck pain, radiating pain to her left shoulder, gait abnormality Rectal cancer,   Lumbar decompression surgery. Hx of rectal cancer, s/p surgery 2003, and 2003, ileostomy and redo,  did not require radiation and chemo     She presented to [facility] emergency room on February 20, 2021 after fell at home that was witnessed by her daughter, she suffered long depression anxiety, the day of emergency presentation, she was rushing to the refrigerator end up fell, daughter and daughter witnessed the episode, reported patient  was very anxious, impending   She also complains of intermittent low back pain, as it knife is currently through her back, bilateral hands and feet paresthesia, difficult to keep her train of thoughts, short-term memory loss, frequent lightheadedness dizziness especially when standing up position,   She is already on lamotrigine 200 mg twice a day from her psychiatrist, missing her medication sometimes, complains of racing thoughts, auditory and visual hallucinations   REVIEW OF SYSTEMS: Out of a complete 14 system review of symptoms, the patient complains only of the following symptoms, episodic freezing episodes and all other reviewed systems are negative.   ALLERGIES: Allergies  Allergen Reactions   Lithium Nausea And Vomiting   Diltiazem Other (See Comments)    Low heart rate, extreme fatigue, pain   Excedrin Extra Strength [Aspirin-Acetaminophen-Caffeine] Other (See Comments)    Makes her nervous.   Geodon [Ziprasidone Hcl]     Neurologist discontinued d/t diagnosis   Klonopin [Clonazepam] Other (See Comments)    hallucinations     HOME MEDICATIONS: Outpatient Medications Prior to Visit  Medication Sig Dispense Refill   acetaminophen (TYLENOL) 500 MG tablet Take 1,000 mg by mouth every 8 (eight) hours as needed for moderate pain or headache.     clobetasol cream (TEMOVATE) 0.05 % Apply topically.     dapagliflozin propanediol (FARXIGA) 5 MG TABS tablet Take 1 tablet (5 mg total) by mouth daily before breakfast. 30 tablet 0   famotidine (PEPCID) 40 MG tablet TAKE 1 TABLET(40 MG)  BY MOUTH AT BEDTIME 90 tablet 3   furosemide (LASIX) 40 MG tablet Take 1 tablet (40 mg total) by mouth daily. (Patient taking differently: Take 20 mg by mouth daily.) 30 tablet 1   lamoTRIgine (LAMICTAL) 200 MG tablet Take 200 mg by mouth 2 (two) times daily.     LORazepam (ATIVAN) 1 MG tablet Take 1 mg by mouth 3 (three) times daily as needed for anxiety.     losartan (COZAAR) 50 MG tablet Take 1  tablet by mouth at bedtime.     metoprolol succinate (TOPROL-XL) 50 MG 24 hr tablet Take 1 tablet (50 mg total) by mouth daily. Take with or immediately following a meal. 30 tablet 1   nystatin cream (MYCOSTATIN) Apply 1 Application topically 2 (two) times daily.     pantoprazole (PROTONIX) 40 MG tablet Take 1 tablet (40 mg total) by mouth daily. Take 30 minutes before breakfast 90 tablet 3   pravastatin (PRAVACHOL) 20 MG tablet Take 20 mg by mouth at bedtime.     triamcinolone cream (KENALOG) 0.1 % APPLY A THIN LAYER TO THE AFFECTED AREA(S) BY TOPICAL ROUTE 2 TIMES PER DAY     pyridostigmine (MESTINON) 60 MG tablet Take 30 mg by mouth 3 (three) times daily. (Patient not taking: Reported on 09/13/2022)     No facility-administered medications prior to visit.     PAST MEDICAL HISTORY: Past Medical History:  Diagnosis Date   Acid reflux    Allergic rhinitis    Anxiety    Arthritis    osteoarthritis   Bipolar disorder (HCC)    Chronic headache    Depression    Diverticula of colon    DM (diabetes mellitus) (HCC)    Gastric erosions    Gastric polyps    benign    Hemorrhoids    Hiatal hernia    Hypertension    IBS (irritable bowel syndrome)    Lichen planus    Obstructive sleep apnea    Panic disorder    Parkinson's disease    Rectal cancer (HCC) 2003   ileostomy and reversal   Subdural hematoma (HCC)    Tubular adenoma      PAST SURGICAL HISTORY: Past Surgical History:  Procedure Laterality Date   ABDOMINAL HYSTERECTOMY     APPENDECTOMY     BACK SURGERY     BIOPSY  10/01/2019   Procedure: BIOPSY;  Surgeon: Corbin Ade, MD;  [facility];  Service: Endoscopy;;  gastric   CESAREAN SECTION     X  2   CHOLECYSTECTOMY     COLONOSCOPY  08/2007   DR Jena Gauss, friable anal canal hemorrhoids, surgical anastomosis at 3cm, distal scattered tics   COLONOSCOPY  12/15/2010   anal papilla and internal hemorrhoids/diminutive polyp in the base of the cecum. Past, tubular  adenoma.. Next colonoscopy due in April 2017.   COLONOSCOPY  10/27/2005   RMR: Anal canal hemorrhoids. Surgical anastomosis at 3 cm from the anal verge appeared normal. Few scattered distal diverticula. The residual  colonic mucosa appeared normal. I suspect the patient bled from hemorrhoids   COLONOSCOPY N/A 01/15/2015   [id number] residual rectum and colon. next tcs 12/2019   COLONOSCOPY WITH PROPOFOL N/A 05/01/2020   Procedure: COLONOSCOPY WITH PROPOFOL;  Surgeon: Corbin Ade, MD;  Two 3-8 millimeters polyps in ascending colon, diverticulosis in descending colon, surgical anastomosis at 4 cm, otherwise normal exam.  Pathology with tubular adenomas.  Repeat colonoscopy in 5 years.   ESOPHAGEAL  DILATION N/A 06/27/2015   Procedure: ESOPHAGEAL DILATION;  Surgeon: Corbin Ade, MD;  [facility];  Service: Endoscopy;  Laterality: N/A;   ESOPHAGOGASTRODUODENOSCOPY  05/2002   DR Jena Gauss, normal   ESOPHAGOGASTRODUODENOSCOPY  08/03/2011   RMR: small HH/ gastric polyps   ESOPHAGOGASTRODUODENOSCOPY N/A 06/27/2015   Dr. Jena Gauss: Mild erosive reflux esophagitits. Status post passage of a Maloney dilator. Hiatal hernia. Gastric polyps and abnormal gastric mucosa of doubtful clinical significane. status post biopsy, benign fundic gland polyp, negative H.pylori   ESOPHAGOGASTRODUODENOSCOPY (EGD) WITH PROPOFOL N/A 10/01/2019   rourk: Normal esophagus status post esophageal dilation for dysphagia, erythematous mucosa in the stomach biopsy consistent with reactive gastropathy, no H. pylori, multiple gastric polyps (biopsy fundic gland)   low anterior rection  2003   RECTAL CANCER   MALONEY DILATION N/A 10/01/2019   Procedure: MALONEY DILATION;  Surgeon: Corbin Ade, MD;  [facility];  Service: Endoscopy;  Laterality: N/A;   POLYPECTOMY  05/01/2020   Procedure: POLYPECTOMY;  Surgeon: Corbin Ade, MD;  [facility];  Service: Endoscopy;;   SPINE SURGERY  2001   L5,S1  HEMILAMINOTOMY AND DISCECTOMY/DR DEATON     FAMILY HISTORY: Family History  Problem Relation Age of Onset   Colon cancer Paternal Grandfather 66   Colon polyps Brother    Colon polyps Cousin    Diabetes Mother    Hypertension Mother    Hypertension Father    Heart attack Father    Heart failure Father      SOCIAL HISTORY: Social History   Socioeconomic History   Marital status: Divorced    Spouse name: Not on file   Number of children: 2   Years of education: 12   Highest education level: 12th grade  Occupational History   Occupation: Magazine features editor: RETIRED    Comment: [employer]  Tobacco Use   Smoking status: Never    Passive exposure: Never   Smokeless tobacco: Never   Tobacco comments:    Never smoked  Vaping Use   Vaping Use: Never used  Substance and Sexual Activity   Alcohol use: No   Drug use: No   Sexual activity: Not Currently    Partners: Male  Other Topics Concern   Not on file  Social History Narrative   Not on file   Social Determinants of Health   Financial Resource Strain: Low Risk  (04/08/2022)   Overall Financial Resource Strain (CARDIA)    Difficulty of Paying Living Expenses: Not hard at all  Food Insecurity: No Food Insecurity (04/08/2022)   Hunger Vital Sign    Worried About Running Out of Food in the Last Year: Never true    Ran Out of Food in the Last Year: Never true  Transportation Needs: Unmet Transportation Needs (04/08/2022)   PRAPARE - Administrator, Civil Service (Medical): Yes    Lack of Transportation (Non-Medical): No  Physical Activity: Inactive (04/08/2022)   Exercise Vital Sign    Days of Exercise per Week: 0 days    Minutes of Exercise per Session: 0 min  Stress: No Stress Concern Present (04/08/2022)   [employer] of Occupational Health - Occupational Stress Questionnaire    Feeling of Stress : Only a little  Social Connections: Moderately Isolated (04/08/2022)   Social Connection and  Isolation Panel [NHANES]    Frequency of Communication with Friends and Family: More than three times a week    Frequency  of Social Gatherings with Friends and Family: More than three times a week    Attends Religious Services: More than 4 times per year    Active Member of [employer] or Organizations: No    Attends Banker Meetings: Never    Marital Status: Divorced  Catering manager Violence: Not At Risk (04/08/2022)   Humiliation, Afraid, Rape, and Kick questionnaire    Fear of Current or Ex-Partner: No    Emotionally Abused: No    Physically Abused: No    Sexually Abused: No     PHYSICAL EXAM  Vitals:   03/07/23 0952  BP: (!) 192/99  Pulse: 68  Weight: 134 lb (60.8 kg)  Height: 5\' 1"  (1.549 m)   Body mass index is 25.32 kg/m.  Generalized: Well developed, in no acute distress  Cardiology: normal rate and rhythm, no murmur auscultated  Respiratory: clear to auscultation bilaterally    Neurological examination  Mentation: Alert oriented to time, place, history taking. Follows all commands speech and language fluent Cranial nerve II-XII: Pupils were equal round reactive to light. Extraocular movements were full, visual field were full on confrontational test. Facial sensation and strength were normal. Head turning and shoulder shrug  were normal and symmetric. Motor: The motor testing reveals 5 over 5 strength of all 4 extremities. Good symmetric motor tone is noted throughout.  Gait and station: Gait is normal.    DIAGNOSTIC DATA (LABS, IMAGING, TESTING) - I reviewed patient records, labs, notes, testing and imaging myself where available.  Lab Results  Component Value Date   WBC 4.6 06/01/2022   HGB 14.1 06/01/2022   HCT 42.9 06/01/2022   MCV 81 06/01/2022   PLT 319 06/01/2022      Component Value Date/Time   NA 144 10/11/2022 0740   K 3.8 10/11/2022 0740   K 4.6 06/26/2010 1400   CL 105 10/11/2022 0740   CO2 21 10/11/2022 0740   GLUCOSE 109 (H)  10/11/2022 0740   GLUCOSE 213 (H) 02/20/2021 1039   BUN 18 10/11/2022 0740   CREATININE 1.25 (H) 10/11/2022 0740   CREATININE 1.21 (H) 08/11/2020 1400   CALCIUM 9.5 10/11/2022 0740   PROT 7.0 03/02/2023 1135   ALBUMIN 4.5 03/02/2023 1135   AST 17 03/02/2023 1135   AST 17 06/26/2010 1400   ALT 15 03/02/2023 1135   ALKPHOS 145 (H) 03/02/2023 1135   ALKPHOS 92 06/26/2010 1400   BILITOT 0.3 03/02/2023 1135   BILITOT 0.3 06/26/2010 1400   GFRNONAA 43 (L) 02/20/2021 1039   GFRNONAA 48 (L) 08/11/2020 1400   GFRAA 55 (L) 08/11/2020 1400   GFRAA 55 (L) 08/11/2020 1315   Lab Results  Component Value Date   CHOL 142 10/11/2022   HDL 63 10/11/2022   LDLCALC 64 10/11/2022   TRIG 79 10/11/2022   CHOLHDL 2.3 10/11/2022   Lab Results  Component Value Date   HGBA1C 5.9 09/13/2022   Lab Results  Component Value Date   VITAMINB12 432 01/18/2020   Lab Results  Component Value Date   TSH 1.310 08/16/2022        No data to display              08/16/2022   11:11 AM  Montreal Cognitive Assessment   Visuospatial/ Executive (0/5) 2  Naming (0/3) 3  Attention: Read list of digits (0/2) 1  Attention: Read list of letters (0/1) 1  Attention: Serial 7 subtraction starting at 100 (0/3) 1  Language: Repeat phrase (  0/2) 2  Language : Fluency (0/1) 1  Abstraction (0/2) 2  Delayed Recall (0/5) 5  Orientation (0/6) 6  Total 24  Adjusted Score (based on education) 24     ASSESSMENT AND PLAN  67 y.o. year old female  has a past medical history of Acid reflux, Allergic rhinitis, Anxiety, Arthritis, Bipolar disorder (HCC), Chronic headache, Depression, Diverticula of colon, DM (diabetes mellitus) (HCC), Gastric erosions, Gastric polyps, Hemorrhoids, Hiatal hernia, Hypertension, IBS (irritable bowel syndrome), Lichen planus, Obstructive sleep apnea, Panic disorder, Parkinson's disease, Rectal cancer (HCC) (2003), Subdural hematoma (HCC), and Tubular adenoma. here with    Gait  abnormality  Bipolar 1 disorder (HCC)  Essential hypertension, benign  Linda English denies confusion episodes but reports an episode of freezing last week where she was suddenly unable to walk. 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 No consistent with seizure like activity. I have encouraged her to continue close follow up with psychiatry and call PCP asap to discuss BP management plan. I have encouraged her to take medications as prescribed. Fortunately, she is asymptomatic, today. Healthy lifestyle habits encouraged. She will follow up with PCP as directed. She will return to see me as needed. She verbalizes understanding and agreement with this plan.   No orders of the defined types were placed in this encounter.    No orders of the defined types were placed in this encounter.   I spent 30 minutes of face-to-face and non-face-to-face time with patient.  This included previsit chart review, lab review, study review, order entry, electronic health record documentation, patient education.   Shawnie Dapper, MSN, FNP-C 03/07/2023, 10:49 AM  [facility] [address], [address] [place], [place] [address] [phone]

## 2023-03-01 ENCOUNTER — Ambulatory Visit: Payer: BC Managed Care – PPO | Admitting: "Endocrinology

## 2023-03-02 DIAGNOSIS — R7989 Other specified abnormal findings of blood chemistry: Secondary | ICD-10-CM | POA: Diagnosis not present

## 2023-03-07 ENCOUNTER — Encounter: Payer: Self-pay | Admitting: Family Medicine

## 2023-03-07 ENCOUNTER — Ambulatory Visit (INDEPENDENT_AMBULATORY_CARE_PROVIDER_SITE_OTHER): Payer: Medicare Other | Admitting: Family Medicine

## 2023-03-07 VITALS — BP 192/99 | HR 68 | Ht 61.0 in | Wt 134.0 lb

## 2023-03-07 DIAGNOSIS — F319 Bipolar disorder, unspecified: Secondary | ICD-10-CM

## 2023-03-07 DIAGNOSIS — I10 Essential (primary) hypertension: Secondary | ICD-10-CM

## 2023-03-07 DIAGNOSIS — R269 Unspecified abnormalities of gait and mobility: Secondary | ICD-10-CM | POA: Diagnosis not present

## 2023-03-08 ENCOUNTER — Ambulatory Visit: Payer: Medicare Other | Admitting: Gastroenterology

## 2023-03-08 ENCOUNTER — Encounter: Payer: Self-pay | Admitting: Gastroenterology

## 2023-03-08 VITALS — BP 162/84 | HR 63 | Temp 98.3°F | Ht 61.0 in | Wt 135.8 lb

## 2023-03-08 DIAGNOSIS — K581 Irritable bowel syndrome with constipation: Secondary | ICD-10-CM

## 2023-03-08 DIAGNOSIS — R7989 Other specified abnormal findings of blood chemistry: Secondary | ICD-10-CM | POA: Diagnosis not present

## 2023-03-08 NOTE — Patient Instructions (Signed)
Continue famotidine 40 mg at bedtime and pantoprazole 40 mg daily before breakfast for acid reflux. Consider taking Benefiber if you have not had a bowel movement in 24 hours.  Use daily as needed. We will be in touch with results from recent labs as soon as they are available.

## 2023-03-08 NOTE — Progress Notes (Signed)
GI Office Note    Referring Provider: Carylon Perches, MD Primary Care Physician:  Carylon Perches, MD  Primary Gastroenterologist: Roetta Sessions, MD   Chief Complaint   Chief Complaint  Patient presents with   Follow-up    Doing well, no issues    History of Present Illness   Linda English is a 67 y.o. female presenting today for follow-up.  Seen back in December.  She has a history of GERD, IBS-C, chronic intermittent elevated alkaline phosphatase.  Complicated GI history to include history of rectal cancer status post low anterior resection in 2003, alternating constipation and diarrhea status post evaluation at [facility] with pelvic floor dysfunction and underwent anorectal manometry that showed type IV dyssynergy, GERD, dysphagia, nausea and vomiting with multiple imaging studies including CT head/MRI brain/CT abdomen pelvis and evaluated at [facility] with recommendations to continue Marinol. GES normal. Colonoscopy due 2026 for adenomatous colon polyps. EGD in 2021 with normal-appearing esophagus status post empiric dilation with large bore [facility] dilator, provided good relief. Right upper quadrant ultrasound 09/18/2020 with unremarkable liver, CBD 3 mm.   Chronically elevated alkaline phosphatase.  GGT has been normal.  AMA however slightly elevated but not enough to call positive.  Liver ultrasound in 2022 unremarkable.  Previous hepatitis B surface antigen negative, hepatitis B surface antibody negative, hepatitis B core antibody negative, hepatitis C antibody negative, hepatitis A total antibody negative, iron and ferritin unremarkable.  Labs last week with alkaline phosphatase of 145, up from 124.  Total bilirubin 0.3, AST 17, ALT 15.  Primary biliary cholangitis panel pending.  Overall feels well.  Appetite good.  Weight stable.  Has some heartburn and indigestion but not daily.  Bowel movement occurs every day to every few days.  Will usually take Benefiber if she has not  had a stool in a few days.  No melena or rectal bleeding.  Taking pantoprazole 40 mg in the morning and famotidine 40 mg at bedtime.    Medications   Current Outpatient Medications  Medication Sig Dispense Refill   acetaminophen (TYLENOL) 500 MG tablet Take 1,000 mg by mouth every 8 (eight) hours as needed for moderate pain or headache.     clobetasol cream (TEMOVATE) 0.05 % Apply topically.     famotidine (PEPCID) 40 MG tablet TAKE 1 TABLET(40 MG) BY MOUTH AT BEDTIME 90 tablet 3   furosemide (LASIX) 40 MG tablet Take 1 tablet (40 mg total) by mouth daily. (Patient taking differently: Take 20 mg by mouth daily.) 30 tablet 1   JARDIANCE 10 MG TABS tablet Take 10 mg by mouth daily.     lamoTRIgine (LAMICTAL) 200 MG tablet Take 200 mg by mouth 2 (two) times daily.     LORazepam (ATIVAN) 1 MG tablet Take 1 mg by mouth 3 (three) times daily as needed for anxiety.     losartan (COZAAR) 50 MG tablet Take 1 tablet by mouth at bedtime.     metoprolol succinate (TOPROL-XL) 50 MG 24 hr tablet Take 1 tablet (50 mg total) by mouth daily. Take with or immediately following a meal. 30 tablet 1   nystatin cream (MYCOSTATIN) Apply 1 Application topically 2 (two) times daily.     pantoprazole (PROTONIX) 40 MG tablet Take 1 tablet (40 mg total) by mouth daily. Take 30 minutes before breakfast 90 tablet 3   pravastatin (PRAVACHOL) 20 MG tablet Take 20 mg by mouth at bedtime.     triamcinolone cream (KENALOG) 0.1 % APPLY A  THIN LAYER TO THE AFFECTED AREA(S) BY TOPICAL ROUTE 2 TIMES PER DAY     No current facility-administered medications for this visit.    Allergies   Allergies as of 03/08/2023 - Review Complete 03/08/2023  Allergen Reaction Noted   Lithium Nausea And Vomiting 06/27/2015   Diltiazem Other (See Comments) 08/16/2022   Excedrin extra strength [aspirin-acetaminophen-caffeine] Other (See Comments) 10/28/2014   Geodon [ziprasidone hcl]  05/01/2020   Klonopin [clonazepam] Other (See Comments)  10/23/2019      Review of Systems   General: Negative for anorexia, weight loss, fever, chills, fatigue, weakness. ENT: Negative for hoarseness, difficulty swallowing , nasal congestion. CV: Negative for chest pain, angina, palpitations, dyspnea on exertion, peripheral edema.  Respiratory: Negative for dyspnea at rest, dyspnea on exertion, cough, sputum, wheezing.  GI: See history of present illness. GU:  Negative for dysuria, hematuria, urinary incontinence, urinary frequency, nocturnal urination.  Endo: Negative for unusual weight change.     Physical Exam   BP (!) 162/84 (BP Location: Right Arm, Patient Position: Sitting, Cuff Size: Normal)   Pulse 63   Temp 98.3 F (36.8 C) (Oral)   Ht 5\' 1"  (1.549 m)   Wt 135 lb 12.8 oz (61.6 kg)   SpO2 100%   BMI 25.66 kg/m    General: Well-nourished, well-developed in no acute distress.  Eyes: No icterus. Mouth: Oropharyngeal mucosa moist and pink Abdomen: Bowel sounds are normal, nondistended, no hepatosplenomegaly or masses,  no abdominal bruits or hernia , no rebound or guarding.  Mild epigastric tenderness. Rectal: Not performed Extremities: No lower extremity edema. No clubbing or deformities. Neuro: Alert and oriented x 4   Skin: Warm and dry, no jaundice.   Psych: Alert and cooperative, normal mood and affect.  Labs   Lab Results  Component Value Date   ALT 15 03/02/2023   AST 17 03/02/2023   GGT 30 08/10/2022   ALKPHOS 145 (H) 03/02/2023   BILITOT 0.3 03/02/2023   Lab Results  Component Value Date   CREATININE 1.25 (H) 10/11/2022   BUN 18 10/11/2022   NA 144 10/11/2022   K 3.8 10/11/2022   CL 105 10/11/2022   CO2 21 10/11/2022   Lab Results  Component Value Date   WBC 4.6 06/01/2022   HGB 14.1 06/01/2022   HCT 42.9 06/01/2022   MCV 81 06/01/2022   PLT 319 06/01/2022   Lab Results  Component Value Date   HGBA1C 5.9 09/13/2022    Imaging Studies   No results found.  Assessment   GERD: Doing well,  reinforced antireflux measures.  IBS-C: Using Benefiber as needed.  Going a few days at a time without a stool.  Encouraged her to take Benefiber regularly.  Elevated LFTs: Chronic intermittent elevated alkaline phosphatase.  AMA equivocal.  Recent alk phos slightly more elevated in the 140 range.  PBC panel pending.  The patient was found to have elevated blood pressure when vital signs were checked in the office. The blood pressure was rechecked by the nursing staff and it was found be persistently elevated >140/90 mmHg. I personally advised to the patient to follow up closely with his PCP for hypertension control.    PLAN   Follow-up pending labs as available. Continue famotidine 40 mg at bedtime and pantoprazole 40 mg before breakfast. Take Benefiber if no bowel movement in 24 hours.  Use more regularly to maintain adequate bowel function.     Leanna Battles. Melvyn Neth, MHS, PA-C [facility]

## 2023-03-09 DIAGNOSIS — E119 Type 2 diabetes mellitus without complications: Secondary | ICD-10-CM | POA: Diagnosis not present

## 2023-03-09 DIAGNOSIS — I10 Essential (primary) hypertension: Secondary | ICD-10-CM | POA: Diagnosis not present

## 2023-03-09 DIAGNOSIS — E782 Mixed hyperlipidemia: Secondary | ICD-10-CM | POA: Diagnosis not present

## 2023-03-11 ENCOUNTER — Ambulatory Visit: Payer: BC Managed Care – PPO | Admitting: "Endocrinology

## 2023-03-13 LAB — COMPREHENSIVE METABOLIC PANEL
ALT: 11 IU/L (ref 0–32)
AST: 16 IU/L (ref 0–40)
Albumin: 4.4 g/dL (ref 3.9–4.9)
Alkaline Phosphatase: 147 IU/L — ABNORMAL HIGH (ref 44–121)
BUN/Creatinine Ratio: 10 — ABNORMAL LOW (ref 12–28)
BUN: 12 mg/dL (ref 8–27)
Bilirubin Total: 0.4 mg/dL (ref 0.0–1.2)
CO2: 25 mmol/L (ref 20–29)
Calcium: 9.6 mg/dL (ref 8.7–10.3)
Chloride: 103 mmol/L (ref 96–106)
Creatinine, Ser: 1.18 mg/dL — ABNORMAL HIGH (ref 0.57–1.00)
Globulin, Total: 2.6 g/dL (ref 1.5–4.5)
Glucose: 88 mg/dL (ref 70–99)
Potassium: 4.2 mmol/L (ref 3.5–5.2)
Sodium: 143 mmol/L (ref 134–144)
Total Protein: 7 g/dL (ref 6.0–8.5)
eGFR: 51 mL/min/{1.73_m2} — ABNORMAL LOW (ref >59)

## 2023-03-13 LAB — LIPID PANEL
Chol/HDL Ratio: 2.4 ratio (ref 0.0–4.4)
Cholesterol, Total: 155 mg/dL (ref 100–199)
HDL: 65 mg/dL (ref >39)
LDL Chol Calc (NIH): 73 mg/dL (ref 0–99)
Triglycerides: 94 mg/dL (ref 0–149)
VLDL Cholesterol Cal: 17 mg/dL (ref 5–40)

## 2023-03-13 LAB — CORTISOL-AM, BLOOD: Cortisol - AM: 12.5 ug/dL (ref 6.2–19.4)

## 2023-03-14 ENCOUNTER — Ambulatory Visit: Payer: BC Managed Care – PPO | Admitting: "Endocrinology

## 2023-03-23 LAB — ANA TITER AND PATTERN
Homogeneous Pattern: 1:40 {titer} — ABNORMAL HIGH
Speckled Pattern: 1:40 {titer} — ABNORMAL HIGH

## 2023-03-23 LAB — PRIMARY BILIARY CHOLANGITIS PR
Anti-GP-210 Ab (RDL): <20 Units (ref <20)
Anti-Mitochondrial Ab by IFA: <1:20 {titer}
Anti-Mitochondrial M2 Ab (RDL): <20 Units (ref <20)
Anti-Nuclear Ab by IFA (RDL): POSITIVE — AB
Anti-SP-100 Ab (RDL): <20 Units (ref <20)
Anti-Smooth Muscle Ab by IFA: 1:40 {titer} — ABNORMAL HIGH

## 2023-03-23 LAB — HEPATIC FUNCTION PANEL
ALT: 15 IU/L (ref 0–32)
AST: 17 IU/L (ref 0–40)
Albumin: 4.5 g/dL (ref 3.9–4.9)
Alkaline Phosphatase: 145 IU/L — ABNORMAL HIGH (ref 44–121)
Bilirubin Total: 0.3 mg/dL (ref 0.0–1.2)
Bilirubin, Direct: 0.13 mg/dL (ref 0.00–0.40)
Total Protein: 7 g/dL (ref 6.0–8.5)

## 2023-03-24 DIAGNOSIS — G629 Polyneuropathy, unspecified: Secondary | ICD-10-CM | POA: Diagnosis not present

## 2023-03-24 DIAGNOSIS — I10 Essential (primary) hypertension: Secondary | ICD-10-CM | POA: Diagnosis not present

## 2023-03-31 ENCOUNTER — Other Ambulatory Visit: Payer: Self-pay

## 2023-03-31 ENCOUNTER — Other Ambulatory Visit: Payer: Self-pay | Admitting: *Deleted

## 2023-03-31 DIAGNOSIS — R748 Abnormal levels of other serum enzymes: Secondary | ICD-10-CM | POA: Diagnosis not present

## 2023-03-31 DIAGNOSIS — R768 Other specified abnormal immunological findings in serum: Secondary | ICD-10-CM

## 2023-04-05 ENCOUNTER — Ambulatory Visit (INDEPENDENT_AMBULATORY_CARE_PROVIDER_SITE_OTHER): Payer: Medicare Other | Admitting: "Endocrinology

## 2023-04-05 ENCOUNTER — Encounter: Payer: Self-pay | Admitting: "Endocrinology

## 2023-04-05 VITALS — BP 130/86 | HR 60 | Ht 61.0 in | Wt 133.6 lb

## 2023-04-05 DIAGNOSIS — E119 Type 2 diabetes mellitus without complications: Secondary | ICD-10-CM

## 2023-04-05 DIAGNOSIS — E782 Mixed hyperlipidemia: Secondary | ICD-10-CM

## 2023-04-05 DIAGNOSIS — I10 Essential (primary) hypertension: Secondary | ICD-10-CM | POA: Diagnosis not present

## 2023-04-05 DIAGNOSIS — Z7984 Long term (current) use of oral hypoglycemic drugs: Secondary | ICD-10-CM

## 2023-04-05 LAB — POCT GLYCOSYLATED HEMOGLOBIN (HGB A1C): HbA1c, POC (controlled diabetic range): 5.8 % (ref 0.0–7.0)

## 2023-04-05 NOTE — Progress Notes (Signed)
04/05/2023, 9:54 AM   Endocrinology follow-up note  Subjective:    Patient ID: Linda English, female    DOB: 11-16-1955, PCP Carylon Perches, MD   Past Medical History:  Diagnosis Date   Acid reflux    Allergic rhinitis    Anxiety    Arthritis    osteoarthritis   Bipolar disorder (HCC)    Chronic headache    Depression    Diverticula of colon    DM (diabetes mellitus) (HCC)    Gastric erosions    Gastric polyps    benign    Hemorrhoids    Hiatal hernia    Hypertension    IBS (irritable bowel syndrome)    Lichen planus    Obstructive sleep apnea    Panic disorder    Parkinson's disease    Rectal cancer (HCC) 2003   ileostomy and reversal   Subdural hematoma (HCC)    Tubular adenoma    Past Surgical History:  Procedure Laterality Date   ABDOMINAL HYSTERECTOMY     APPENDECTOMY     BACK SURGERY     BIOPSY  10/01/2019   Procedure: BIOPSY;  Surgeon: Corbin Ade, MD;  [facility]: [facility];  Service: Endoscopy;;  gastric   CESAREAN SECTION     X  2   CHOLECYSTECTOMY     COLONOSCOPY  08/2007   DR Jena Gauss, friable anal canal hemorrhoids, surgical anastomosis at 3cm, distal scattered tics   COLONOSCOPY  12/15/2010   anal papilla and internal hemorrhoids/diminutive polyp in the base of the cecum. Past, tubular adenoma.. Next colonoscopy due in April 2017.   COLONOSCOPY  10/27/2005   RMR: Anal canal hemorrhoids. Surgical anastomosis at 3 cm from the anal verge appeared normal. Few scattered distal diverticula. The residual  colonic mucosa appeared normal. I suspect the patient bled from hemorrhoids   COLONOSCOPY N/A 01/15/2015   WGN:FAOZHY residual rectum and colon. next tcs 12/2019   COLONOSCOPY WITH PROPOFOL N/A 05/01/2020   Procedure: COLONOSCOPY WITH PROPOFOL;  Surgeon: Corbin Ade, MD;  Two 3-8 millimeters polyps in ascending colon, diverticulosis in descending colon, surgical anastomosis at 4 cm, otherwise  normal exam.  Pathology with tubular adenomas.  Repeat colonoscopy in 5 years.   ESOPHAGEAL DILATION N/A 06/27/2015   Procedure: ESOPHAGEAL DILATION;  Surgeon: Corbin Ade, MD;  [facility]: [facility];  Service: Endoscopy;  Laterality: N/A;   ESOPHAGOGASTRODUODENOSCOPY  05/2002   DR Jena Gauss, normal   ESOPHAGOGASTRODUODENOSCOPY  08/03/2011   RMR: small HH/ gastric polyps   ESOPHAGOGASTRODUODENOSCOPY N/A 06/27/2015   Dr. Jena Gauss: Mild erosive reflux esophagitits. Status post passage of a Maloney dilator. Hiatal hernia. Gastric polyps and abnormal gastric mucosa of doubtful clinical significane. status post biopsy, benign fundic gland polyp, negative H.pylori   ESOPHAGOGASTRODUODENOSCOPY (EGD) WITH PROPOFOL N/A 10/01/2019   rourk: Normal esophagus status post esophageal dilation for dysphagia, erythematous mucosa in the stomach biopsy consistent with reactive gastropathy, no H. pylori, multiple gastric polyps (biopsy fundic gland)   low anterior rection  2003   RECTAL CANCER   MALONEY DILATION N/A 10/01/2019   Procedure: MALONEY DILATION;  Surgeon: Corbin Ade, MD;  [facility];  Service: Endoscopy;  Laterality: N/A;  POLYPECTOMY  05/01/2020   Procedure: POLYPECTOMY;  Surgeon: Corbin Ade, MD;  [facility];  Service: Endoscopy;;   SPINE SURGERY  2001   L5,S1 HEMILAMINOTOMY AND DISCECTOMY/DR DEATON   Social History   Socioeconomic History   Marital status: Divorced    Spouse name: Not on file   Number of children: 2   Years of education: 12   Highest education level: 12th grade  Occupational History   Occupation: Magazine features editor: RETIRED    Comment: [employer]  Tobacco Use   Smoking status: Never    Passive exposure: Never   Smokeless tobacco: Never   Tobacco comments:    Never smoked  Vaping Use   Vaping status: Never Used  Substance and Sexual Activity   Alcohol use: No   Drug use: No   Sexual activity: Not Currently    Partners: Male  Other  Topics Concern   Not on file  Social History Narrative   Not on file   Social Determinants of Health   Financial Resource Strain: Low Risk  (04/08/2022)   Overall Financial Resource Strain (CARDIA)    Difficulty of Paying Living Expenses: Not hard at all  Food Insecurity: No Food Insecurity (04/08/2022)   Hunger Vital Sign    Worried About Running Out of Food in the Last Year: Never true    Ran Out of Food in the Last Year: Never true  Transportation Needs: Unmet Transportation Needs (04/08/2022)   PRAPARE - Administrator, Civil Service (Medical): Yes    Lack of Transportation (Non-Medical): No  Physical Activity: Inactive (04/08/2022)   Exercise Vital Sign    Days of Exercise per Week: 0 days    Minutes of Exercise per Session: 0 min  Stress: No Stress Concern Present (04/08/2022)   [employer] - Occupational Stress Questionnaire    Feeling of Stress : Only a little  Social Connections: Moderately Isolated (04/08/2022)   Social Connection and Isolation Panel [NHANES]    Frequency of Communication with Friends and Family: More than three times a week    Frequency of Social Gatherings with Friends and Family: More than three times a week    Attends Religious Services: More than 4 times per year    Active Member of [employer] or Organizations: No    Attends Banker Meetings: Never    Marital Status: Divorced   Family History  Problem Relation Age of Onset   Colon cancer Paternal Grandfather 10   Colon polyps Brother    Colon polyps Cousin    Diabetes Mother    Hypertension Mother    Hypertension Father    Heart attack Father    Heart failure Father    Outpatient Encounter Medications as of 04/05/2023  Medication Sig   acetaminophen (TYLENOL) 500 MG tablet Take 1,000 mg by mouth every 8 (eight) hours as needed for moderate pain or headache.   clobetasol cream (TEMOVATE) 0.05 % Apply topically.   famotidine (PEPCID) 40 MG tablet  TAKE 1 TABLET(40 MG) BY MOUTH AT BEDTIME   furosemide (LASIX) 40 MG tablet Take 1 tablet (40 mg total) by mouth daily. (Patient taking differently: Take 20 mg by mouth daily.)   JARDIANCE 10 MG TABS tablet Take 10 mg by mouth daily.   lamoTRIgine (LAMICTAL) 200 MG tablet Take 200 mg by mouth 2 (two) times daily.   LORazepam (ATIVAN) 1 MG tablet Take 1 mg by mouth 3 (  three) times daily as needed for anxiety.   losartan (COZAAR) 50 MG tablet Take 1 tablet by mouth at bedtime.   metoprolol succinate (TOPROL-XL) 50 MG 24 hr tablet Take 1 tablet (50 mg total) by mouth daily. Take with or immediately following a meal.   nystatin cream (MYCOSTATIN) Apply 1 Application topically 2 (two) times daily.   pantoprazole (PROTONIX) 40 MG tablet Take 1 tablet (40 mg total) by mouth daily. Take 30 minutes before breakfast   pravastatin (PRAVACHOL) 20 MG tablet Take 20 mg by mouth at bedtime.   triamcinolone cream (KENALOG) 0.1 % APPLY A THIN LAYER TO THE AFFECTED AREA(S) BY TOPICAL ROUTE 2 TIMES PER DAY   No facility-administered encounter medications on file as of 04/05/2023.   ALLERGIES: Allergies  Allergen Reactions   Lithium Nausea And Vomiting   Diltiazem Other (See Comments)    Low heart rate, extreme fatigue, pain   Excedrin Extra Strength [Aspirin-Acetaminophen-Caffeine] Other (See Comments)    Makes her nervous.   Geodon [Ziprasidone Hcl]     Neurologist discontinued d/t diagnosis   Klonopin [Clonazepam] Other (See Comments)    hallucinations    VACCINATION STATUS: Immunization History  Administered Date(s) Administered   Influenza,inj,Quad PF,6+ Mos 06/25/2019   Pneumococcal Polysaccharide-23 08/13/2019    HPI July H English is 67 y.o. female who presents today with a medical history as above. she is being seen in follow-up after she was seen in consultation for controlled type 2 diabetes.  This patient was kept on low-dose SGLT2 inhibitors to treat type 2 diabetes complicated by  mild CKD.   She is currently on Jardiance 10 mg p.o. daily at bedtime.  Her point-of-care A1c today that 5.8%.  She denies any problems with Jardiance. Previously seen in this practice for mild hypercortisolemia which did not show any evidence of Cushing syndrome.    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 Uncontrolled blood pressure currently on multiple medications including losartan 50 mg p.o. daily, Lasix 20 mg p.o. daily, metoprolol 50 mg p.o. daily.   Review of Systems  Constitutional: no recent weight gain/loss, no fatigue, no subjective hyperthermia, no subjective hypothermia Eyes: no blurry vision, no xerophthalmia   Objective:       04/05/2023    9:20 AM 04/05/2023    9:01 AM 03/08/2023    3:47 PM  Vitals with BMI  Height  5\' 1"    Weight  133 lbs 10 oz   BMI  25.26   Systolic 130 150 213  Diastolic 86 90 84  Pulse  60 63    BP 130/86 Comment: R arm with manuel cuff. Dr. made aware.  Pulse 60   Ht 5\' 1"  (1.549 m)   Wt 133 lb 9.6 oz (60.6 kg)   BMI 25.24 kg/m   Wt Readings from Last 3 Encounters:  04/05/23 133 lb 9.6 oz (60.6 kg)  03/08/23 135 lb 12.8 oz (61.6 kg)  03/07/23 134 lb (60.8 kg)    Physical Exam  Constitutional:  Body mass index is 25.24 kg/m.,  not in acute distress, normal state of mind Eyes: PERRLA, EOMI, no exophthalmos ENT: moist mucous membranes, no gross thyromegaly, no gross cervical lymphadenopathy   CMP ( most recent) CMP     Component Value Date/Time   NA 143 03/09/2023 0734   K 4.2 03/09/2023 0734   K 4.6 06/26/2010 1400   CL 103 03/09/2023 0734   CO2 25 03/09/2023 0734   GLUCOSE 88  03/09/2023 0734   GLUCOSE 213 (H) 02/20/2021 1039   BUN 12 03/09/2023 0734   CREATININE 1.18 (H) 03/09/2023 0734   CREATININE 1.21 (H) 08/11/2020 1400   CALCIUM 9.6 03/09/2023 0734   PROT 7.0 03/09/2023 0734   ALBUMIN 4.4 03/09/2023 0734   AST 16 03/09/2023 0734   AST 17 06/26/2010 1400   ALT 11 03/09/2023 0734   ALKPHOS  120 03/31/2023 1250   ALKPHOS 92 06/26/2010 1400   BILITOT 0.4 03/09/2023 0734   BILITOT 0.3 06/26/2010 1400   GFRNONAA 43 (L) 02/20/2021 1039   GFRNONAA 48 (L) 08/11/2020 1400   GFRAA 55 (L) 08/11/2020 1400   GFRAA 55 (L) 08/11/2020 1315     Diabetic Labs (most recent): Lab Results  Component Value Date   HGBA1C 5.8 04/05/2023   HGBA1C 5.9 09/13/2022   HGBA1C 5.0 01/17/2020     Lipid Panel ( most recent) Lipid Panel     Component Value Date/Time   CHOL 155 03/09/2023 0734   TRIG 94 03/09/2023 0734   HDL 65 03/09/2023 0734   CHOLHDL 2.4 03/09/2023 0734   CHOLHDL 3.0 08/10/2015 0621   VLDL 38 08/10/2015 0621   LDLCALC 73 03/09/2023 0734   LABVLDL 17 03/09/2023 0734      Lab Results  Component Value Date   TSH 1.310 08/16/2022   TSH 2.230 01/18/2020   TSH 2.048 10/26/2019   TSH 2.213 10/03/2013   TSH 1.312 04/27/2012   FREET4 1.03 01/18/2020   FREET4 1.41 (H) 10/26/2019   FREET4 1.20 04/27/2012           Assessment & Plan:   1. Type 2 diabetes mellitus without complication, without long-term current use of insulin 2. Essential hypertension, benign 3. Mixed hyperlipidemia  - I have reviewed her available endocrine records and clinically evaluated the patient. - Based on these reviews, she has well-controlled type 2 diabetes.  She will not need additional intervention.  She is advised to continue Jardiance 10 mg p.o. daily at breakfast.  Side effects and precautions discussed with her.     In light of her CKD, she is advised to control blood pressure by taking her medications consistently in the morning.  She has Toprol 50 mg p.o. daily, losartan 50 mg p.o. daily, and Lasix as needed. Her labs showed controlled LDL at 73.  She is advised to continue pravastatin 20 mg p.o. nightly for hyperlipidemia.   - she is advised to maintain close follow up with Carylon Perches, MD for primary care needs.   I spent  26  minutes in the care of the patient today including  review of labs from Thyroid Function, CMP, and other relevant labs ; imaging/biopsy records (current and previous including abstractions from other facilities); face-to-face time discussing  her lab results and symptoms, medications doses, her options of short and long term treatment based on the latest standards of care / guidelines;   and documenting the encounter.  Linda English  participated in the discussions, expressed understanding, and voiced agreement with the above plans.  All questions were answered to her satisfaction. she is encouraged to contact clinic should she have any questions or concerns prior to her return visit.  Follow up plan: Return in about 6 months (around 10/06/2023) for Fasting Labs  in AM B4 8, A1c -NV.   Marquis Lunch, MD [facility] [address] [place], [place] [address] Phone: [phone]  Fax: [phone]     04/05/2023,  9:54 AM  This note was partially dictated with voice recognition software. Similar sounding words can be transcribed inadequately or may not  be corrected upon review.

## 2023-04-05 NOTE — Patient Instructions (Signed)

## 2023-04-14 DIAGNOSIS — Z6824 Body mass index (BMI) 24.0-24.9, adult: Secondary | ICD-10-CM | POA: Diagnosis not present

## 2023-04-25 DIAGNOSIS — N1832 Chronic kidney disease, stage 3b: Secondary | ICD-10-CM | POA: Diagnosis not present

## 2023-04-25 DIAGNOSIS — G629 Polyneuropathy, unspecified: Secondary | ICD-10-CM | POA: Diagnosis not present

## 2023-04-25 DIAGNOSIS — I5032 Chronic diastolic (congestive) heart failure: Secondary | ICD-10-CM | POA: Diagnosis not present

## 2023-04-25 DIAGNOSIS — E1129 Type 2 diabetes mellitus with other diabetic kidney complication: Secondary | ICD-10-CM | POA: Diagnosis not present

## 2023-04-25 DIAGNOSIS — I10 Essential (primary) hypertension: Secondary | ICD-10-CM | POA: Diagnosis not present

## 2023-04-29 DIAGNOSIS — E785 Hyperlipidemia, unspecified: Secondary | ICD-10-CM | POA: Diagnosis not present

## 2023-04-29 DIAGNOSIS — E1122 Type 2 diabetes mellitus with diabetic chronic kidney disease: Secondary | ICD-10-CM | POA: Diagnosis not present

## 2023-04-29 DIAGNOSIS — I10 Essential (primary) hypertension: Secondary | ICD-10-CM | POA: Diagnosis not present

## 2023-04-29 DIAGNOSIS — N1832 Chronic kidney disease, stage 3b: Secondary | ICD-10-CM | POA: Diagnosis not present

## 2023-07-13 ENCOUNTER — Telehealth: Payer: Self-pay

## 2023-07-13 DIAGNOSIS — K219 Gastro-esophageal reflux disease without esophagitis: Secondary | ICD-10-CM

## 2023-07-13 NOTE — Telephone Encounter (Signed)
Refill request for famotidine 40mg  tablet and pantoprazole 40 mg tablet, delayed release was received via fax. Pt was last seen on 03/08/2023.

## 2023-07-14 MED ORDER — PANTOPRAZOLE SODIUM 40 MG PO TBEC: 40.0000 mg | DELAYED_RELEASE_TABLET | Freq: Every day | ORAL | 3 refills | Status: DC

## 2023-07-14 MED ORDER — FAMOTIDINE 40 MG PO TABS
ORAL_TABLET | ORAL | 3 refills | Status: DC
Start: 2023-07-14 — End: 2023-08-03

## 2023-07-14 NOTE — Addendum Note (Signed)
Addended by: Tiffany Kocher on: 07/14/2023 12:00 PM   Modules accepted: Orders

## 2023-07-15 ENCOUNTER — Other Ambulatory Visit: Payer: Self-pay | Admitting: Gastroenterology

## 2023-07-15 DIAGNOSIS — K219 Gastro-esophageal reflux disease without esophagitis: Secondary | ICD-10-CM

## 2023-07-26 DIAGNOSIS — E1129 Type 2 diabetes mellitus with other diabetic kidney complication: Secondary | ICD-10-CM | POA: Diagnosis not present

## 2023-07-26 DIAGNOSIS — E785 Hyperlipidemia, unspecified: Secondary | ICD-10-CM | POA: Diagnosis not present

## 2023-07-26 DIAGNOSIS — Z79899 Other long term (current) drug therapy: Secondary | ICD-10-CM | POA: Diagnosis not present

## 2023-07-26 DIAGNOSIS — I10 Essential (primary) hypertension: Secondary | ICD-10-CM | POA: Diagnosis not present

## 2023-07-26 DIAGNOSIS — N1832 Chronic kidney disease, stage 3b: Secondary | ICD-10-CM | POA: Diagnosis not present

## 2023-08-02 ENCOUNTER — Telehealth: Payer: Self-pay

## 2023-08-02 DIAGNOSIS — E1129 Type 2 diabetes mellitus with other diabetic kidney complication: Secondary | ICD-10-CM | POA: Diagnosis not present

## 2023-08-02 DIAGNOSIS — K219 Gastro-esophageal reflux disease without esophagitis: Secondary | ICD-10-CM

## 2023-08-02 DIAGNOSIS — I10 Essential (primary) hypertension: Secondary | ICD-10-CM | POA: Diagnosis not present

## 2023-08-02 DIAGNOSIS — N1832 Chronic kidney disease, stage 3b: Secondary | ICD-10-CM | POA: Diagnosis not present

## 2023-08-02 NOTE — Telephone Encounter (Signed)
Documentation sent from [facility] for Rx's to be sent to them. 1. Famotidine 40 mg tab and 2. Pantoprazole 40 mg tablet, delayed release. [facility] fax  [phone]

## 2023-08-03 ENCOUNTER — Other Ambulatory Visit: Payer: Self-pay | Admitting: Gastroenterology

## 2023-08-03 DIAGNOSIS — K219 Gastro-esophageal reflux disease without esophagitis: Secondary | ICD-10-CM

## 2023-08-03 MED ORDER — PANTOPRAZOLE SODIUM 40 MG PO TBEC: 40.0000 mg | DELAYED_RELEASE_TABLET | Freq: Every day | ORAL | 3 refills | Status: DC

## 2023-08-03 MED ORDER — FAMOTIDINE 40 MG PO TABS: ORAL_TABLET | ORAL | 3 refills | Status: DC

## 2023-08-03 NOTE — Addendum Note (Signed)
Addended by: Tiffany Kocher on: 08/03/2023 05:11 AM   Modules accepted: Orders

## 2023-08-07 ENCOUNTER — Other Ambulatory Visit: Payer: Self-pay

## 2023-08-07 ENCOUNTER — Emergency Department (HOSPITAL_COMMUNITY)
Admission: EM | Admit: 2023-08-07 | Discharge: 2023-08-07 | Disposition: A | Discharge status: Home / Self Care | Payer: BC Managed Care – PPO | Attending: Emergency Medicine | Admitting: Emergency Medicine

## 2023-08-07 ENCOUNTER — Emergency Department (HOSPITAL_COMMUNITY): Payer: BC Managed Care – PPO

## 2023-08-07 ENCOUNTER — Encounter (HOSPITAL_COMMUNITY): Payer: Self-pay

## 2023-08-07 DIAGNOSIS — R531 Weakness: Secondary | ICD-10-CM | POA: Diagnosis present

## 2023-08-07 DIAGNOSIS — Z79899 Other long term (current) drug therapy: Secondary | ICD-10-CM | POA: Insufficient documentation

## 2023-08-07 DIAGNOSIS — Z743 Need for continuous supervision: Secondary | ICD-10-CM | POA: Diagnosis not present

## 2023-08-07 DIAGNOSIS — R410 Disorientation, unspecified: Secondary | ICD-10-CM | POA: Diagnosis not present

## 2023-08-07 DIAGNOSIS — R6889 Other general symptoms and signs: Secondary | ICD-10-CM | POA: Diagnosis not present

## 2023-08-07 DIAGNOSIS — W19XXXA Unspecified fall, initial encounter: Secondary | ICD-10-CM | POA: Diagnosis not present

## 2023-08-07 DIAGNOSIS — G44309 Post-traumatic headache, unspecified, not intractable: Secondary | ICD-10-CM | POA: Diagnosis not present

## 2023-08-07 LAB — CBC WITH DIFFERENTIAL/PLATELET
Abs Immature Granulocytes: 0.01 10*3/uL (ref 0.00–0.07)
Basophils Absolute: 0 10*3/uL (ref 0.0–0.1)
Basophils Relative: 1 %
Eosinophils Absolute: 0.1 10*3/uL (ref 0.0–0.5)
Eosinophils Relative: 3 %
HCT: 44.2 % (ref 36.0–46.0)
Hemoglobin: 14 g/dL (ref 12.0–15.0)
Immature Granulocytes: 0 %
Lymphocytes Relative: 34 %
Lymphs Abs: 1.3 10*3/uL (ref 0.7–4.0)
MCH: 26 pg (ref 26.0–34.0)
MCHC: 31.7 g/dL (ref 30.0–36.0)
MCV: 82.2 fL (ref 80.0–100.0)
Monocytes Absolute: 0.5 10*3/uL (ref 0.1–1.0)
Monocytes Relative: 12 %
Neutro Abs: 2 10*3/uL (ref 1.7–7.7)
Neutrophils Relative %: 50 %
Platelets: 306 10*3/uL (ref 150–400)
RBC: 5.38 MIL/uL — ABNORMAL HIGH (ref 3.87–5.11)
RDW: 14.6 % (ref 11.5–15.5)
WBC: 3.9 10*3/uL — ABNORMAL LOW (ref 4.0–10.5)
nRBC: 0 % (ref 0.0–0.2)

## 2023-08-07 LAB — URINALYSIS, ROUTINE W REFLEX MICROSCOPIC
Bacteria, UA: NONE SEEN
Bilirubin Urine: NEGATIVE
Glucose, UA: >=500 mg/dL — AB
Ketones, ur: NEGATIVE mg/dL
Leukocytes,Ua: NEGATIVE
Nitrite: NEGATIVE
Protein, ur: NEGATIVE mg/dL
Specific Gravity, Urine: 1.011 (ref 1.005–1.030)
pH: 7 (ref 5.0–8.0)

## 2023-08-07 LAB — RAPID URINE DRUG SCREEN, HOSP PERFORMED
Amphetamines: NOT DETECTED
Barbiturates: NOT DETECTED
Benzodiazepines: POSITIVE — AB
Cocaine: NOT DETECTED
Opiates: NOT DETECTED
Tetrahydrocannabinol: NOT DETECTED

## 2023-08-07 LAB — COMPREHENSIVE METABOLIC PANEL
ALT: 15 U/L (ref 0–44)
AST: 22 U/L (ref 15–41)
Albumin: 4.1 g/dL (ref 3.5–5.0)
Alkaline Phosphatase: 103 U/L (ref 38–126)
Anion gap: 8 (ref 5–15)
BUN: 14 mg/dL (ref 8–23)
CO2: 26 mmol/L (ref 22–32)
Calcium: 9.4 mg/dL (ref 8.9–10.3)
Chloride: 106 mmol/L (ref 98–111)
Creatinine, Ser: 1.23 mg/dL — ABNORMAL HIGH (ref 0.44–1.00)
GFR, Estimated: 48 mL/min — ABNORMAL LOW (ref >60)
Glucose, Bld: 111 mg/dL — ABNORMAL HIGH (ref 70–99)
Potassium: 3.6 mmol/L (ref 3.5–5.1)
Sodium: 140 mmol/L (ref 135–145)
Total Bilirubin: 0.4 mg/dL (ref <1.2)
Total Protein: 7.8 g/dL (ref 6.5–8.1)

## 2023-08-07 LAB — ETHANOL: Alcohol, Ethyl (B): <10 mg/dL (ref <10)

## 2023-08-07 NOTE — ED Notes (Signed)
Pt does not take blood thinners

## 2023-08-07 NOTE — ED Provider Notes (Signed)
[place] EMERGENCY DEPARTMENT AT [facility]  Provider Note  CSN: [id number] Arrival date & time: 08/07/23 0155  History Chief Complaint  Patient presents with   Weakness    Linda English is a 67 y.o. female brought by EMS for evaluation of weakness. Daughter at bedside helps supplement history. Patient apparently had a mechanical fall 3-4 days ago. Had been doing fine since then. Tonight, she got up to go to the restroom and was very off balance. She was with her 73 year old grandson who went to get a neighbor and ultimately EMS was called. Patient is confused on the details and timeline, but daughter reports she seemed to be in her normal state of health when she visited her at around 1500hrs on 12/7, although daughter did not see her walk then. Patient has had episodes of confusion and ataxia in the past, evaluated by Neurology who at one time thought it might be parkinsonian side effects of Geodon, so she was taken off a few years ago. She is taking Lorazepam TID for anxiety and daughter thinks this might be contributing to her symptoms tonight.    Home Medications Prior to Admission medications   Medication Sig Start Date End Date Taking? Authorizing Provider  acetaminophen (TYLENOL) 500 MG tablet Take 1,000 mg by mouth every 8 (eight) hours as needed for moderate pain or headache.    [provider]  clobetasol cream (TEMOVATE) 0.05 % Apply topically. 12/15/21   [provider]  famotidine (PEPCID) 40 MG tablet TAKE 1 TABLET(40 MG) BY MOUTH AT BEDTIME 08/03/23   Tiffany Kocher, PA-C  furosemide (LASIX) 40 MG tablet Take 1 tablet (40 mg total) by mouth daily. Patient taking differently: Take 20 mg by mouth daily. 01/20/20   Catarina Hartshorn, MD  JARDIANCE 10 MG TABS tablet Take 10 mg by mouth daily.    [provider]  lamoTRIgine (LAMICTAL) 200 MG tablet Take 200 mg by mouth 2 (two) times daily. 06/11/13   [provider]  LORazepam  (ATIVAN) 1 MG tablet Take 1 mg by mouth 3 (three) times daily as needed for anxiety. 04/16/20   [provider]  losartan (COZAAR) 50 MG tablet Take 1 tablet by mouth at bedtime.    [provider]  metoprolol succinate (TOPROL-XL) 50 MG 24 hr tablet Take 1 tablet (50 mg total) by mouth daily. Take with or immediately following a meal. 01/20/20   Tat, Onalee Hua, MD  nystatin cream (MYCOSTATIN) Apply 1 Application topically 2 (two) times daily.    [provider]  pantoprazole (PROTONIX) 40 MG tablet Take 1 tablet (40 mg total) by mouth daily. Take 30 minutes before breakfast 08/03/23   Tiffany Kocher, PA-C  pravastatin (PRAVACHOL) 20 MG tablet Take 20 mg by mouth at bedtime. 12/15/21   [provider]  triamcinolone cream (KENALOG) 0.1 % APPLY A THIN LAYER TO THE AFFECTED AREA(S) BY TOPICAL ROUTE 2 TIMES PER DAY 04/01/22   [provider]     Allergies    Lithium, Diltiazem, Excedrin extra strength [aspirin-acetaminophen-caffeine], Geodon [ziprasidone hcl], and Klonopin [clonazepam]   Review of Systems   Review of Systems Please see HPI for pertinent positives and negatives  Physical Exam BP (!) 177/73   Pulse 65   Temp 97.9 F (36.6 C)   Resp 20   Ht 5\' 1"  (1.549 m)   Wt 60.3 kg   SpO2 99%   BMI 25.13 kg/m   Physical Exam Vitals  and nursing note reviewed.  Constitutional:      Appearance: Normal appearance.  HENT:     Head: Normocephalic and atraumatic.     Nose: Nose normal.     Mouth/Throat:     Mouth: Mucous membranes are moist.  Eyes:     Extraocular Movements: Extraocular movements intact.     Conjunctiva/sclera: Conjunctivae normal.  Cardiovascular:     Rate and Rhythm: Normal rate.  Pulmonary:     Effort: Pulmonary effort is normal.     Breath sounds: Normal breath sounds.  Abdominal:     General: Abdomen is flat.     Palpations: Abdomen is soft.     Tenderness: There is no abdominal tenderness.  Musculoskeletal:         General: No swelling. Normal range of motion.     Cervical back: Neck supple.  Skin:    General: Skin is warm and dry.  Neurological:     General: No focal deficit present.     Mental Status: She is alert. She is disoriented.     Cranial Nerves: No cranial nerve deficit.     Sensory: No sensory deficit.     Motor: No weakness.     Coordination: Coordination normal.  Psychiatric:        Mood and Affect: Mood normal.     ED Results / Procedures / Treatments   EKG EKG Interpretation Date/Time:  Sunday August 07 2023 02:07:09 EST Ventricular Rate:  74 PR Interval:  183 QRS Duration:  101 QT Interval:  426 QTC Calculation: 473 R Axis:   40  Text Interpretation: Sinus rhythm Left atrial enlargement Left ventricular hypertrophy No significant change since last tracing Confirmed by Susy Frizzle [phone]) on 08/07/2023 2:28:58 AM  Procedures Procedures  Medications Ordered in the ED Medications - No data to display  Initial Impression and Plan  Patient here with some confusion and difficulty walking. Neuro exam is non-focal on arrival, not a candidate for TNK given LSN and doubt LVO. Will check CT, basic labs and reassess.   ED Course   Clinical Course as of 08/07/23 0547  Sun Aug 07, 2023  0231 CMP is unremarkable.  [CS]  0318 CBC is unremarkable. EtOH is neg.  [CS]  [id number] I personally viewed the images from radiology studies and agree with radiologist interpretation: CT head without acute findings  [CS]  0438 Patient has ambulated to the bathroom without difficulty. UA is clear.  [CS]  0527 UDS positive for benzos, consistent with her home meds. She is awake, alert and back to baseline. She would like to go home.  [CS]    Clinical Course User Index [CS] Pollyann Savoy, MD     MDM Rules/Calculators/A&P Medical Decision Making Given presenting complaint, I considered that admission might be necessary. After review of results from ED lab and/or imaging studies,  admission to the hospital is not indicated at this time.    Problems Addressed: Generalized weakness: acute illness or injury  Amount and/or Complexity of Data Reviewed Labs: ordered. Decision-making details documented in ED Course. Radiology: ordered and independent interpretation performed. Decision-making details documented in ED Course. ECG/medicine tests: ordered and independent interpretation performed. Decision-making details documented in ED Course.  Risk Decision regarding hospitalization.     Final Clinical Impression(s) / ED Diagnoses Final diagnoses:  Generalized weakness    Rx / DC Orders ED Discharge Orders     None        Pollyann Savoy, MD 08/07/23  0547  

## 2023-08-07 NOTE — ED Notes (Signed)
Pt ambulated to the restroom and back to bed unassisted with no difficulties.

## 2023-08-07 NOTE — ED Triage Notes (Signed)
Pt to ED from home via [facility] cc confusion and unsteady gait. Pt had a fall yesterday, last known well (spoken to by family member) at 1500. Family called 911 this morning after noticing pt was altered, weak, not able to balance. Pt a/o x4 on arrival to ED, EDP to assess on arrival.

## 2023-08-07 NOTE — ED Notes (Signed)
Cbg 95 ?

## 2023-08-08 DIAGNOSIS — H52223 Regular astigmatism, bilateral: Secondary | ICD-10-CM | POA: Diagnosis not present

## 2023-08-08 LAB — CBG MONITORING, ED: Glucose-Capillary: 95 mg/dL (ref 70–99)

## 2023-08-09 ENCOUNTER — Telehealth: Payer: Self-pay | Admitting: Family Medicine

## 2023-08-09 NOTE — Telephone Encounter (Signed)
Pt states her symptoms have worsened: she's falling more, and memory loss. Appt scheduled and placed on the wait list- Linda English

## 2023-08-10 ENCOUNTER — Telehealth: Payer: Self-pay | Admitting: Family Medicine

## 2023-08-10 NOTE — Telephone Encounter (Signed)
Pt returning call. I messaged the nurse but pt hung up before I could transfer her

## 2023-08-10 NOTE — Telephone Encounter (Signed)
Ok to move up, was seen by Amy, ok to put to her schedule as well

## 2023-08-10 NOTE — Telephone Encounter (Signed)
Spoke w/pt and was able to get scheduled for 08/17/23

## 2023-08-10 NOTE — Telephone Encounter (Signed)
 Lmtrc 1st attempt

## 2023-08-10 NOTE — Telephone Encounter (Signed)
Patients PCP sent in a faxed request for a sooner appointment due to worsening memory loss and increased falls. Can you pleas advise if patient can be worked in sooner on Dr Catalina Gravel schedule ?

## 2023-08-12 NOTE — Telephone Encounter (Signed)
Pt returning call. Hung up before she could be transferred

## 2023-08-17 ENCOUNTER — Telehealth: Payer: Self-pay | Admitting: Neurology

## 2023-08-17 ENCOUNTER — Encounter: Payer: Self-pay | Admitting: Neurology

## 2023-08-17 ENCOUNTER — Ambulatory Visit (INDEPENDENT_AMBULATORY_CARE_PROVIDER_SITE_OTHER): Payer: Medicare HMO | Admitting: Neurology

## 2023-08-17 VITALS — BP 180/101 | HR 69 | Ht 61.0 in | Wt 136.0 lb

## 2023-08-17 DIAGNOSIS — F319 Bipolar disorder, unspecified: Secondary | ICD-10-CM | POA: Diagnosis not present

## 2023-08-17 DIAGNOSIS — R269 Unspecified abnormalities of gait and mobility: Secondary | ICD-10-CM

## 2023-08-17 DIAGNOSIS — R202 Paresthesia of skin: Secondary | ICD-10-CM | POA: Diagnosis not present

## 2023-08-17 DIAGNOSIS — R4020 Unspecified coma: Secondary | ICD-10-CM | POA: Diagnosis not present

## 2023-08-17 DIAGNOSIS — I10 Essential (primary) hypertension: Secondary | ICD-10-CM

## 2023-08-17 NOTE — Progress Notes (Signed)
Chief Complaint  Patient presents with   Fall    Rm 15 with mother  Pt is well, Patients PCP sent in a faxed request for a sooner appointment due to worsening memory loss and increased falls. She also mentions she thinks she is having seizures.       ASSESSMENT AND PLAN  Linda English is a 67 y.o. female   Mild cognitive impairment, confusion spells, in the setting of mood disorder, that was under suboptimal control, Gait abnormality  EEG in January 2024 showed no significant abnormality,  She is very anxious about her constellation of complaints, desire complete evaluation, will repeat MRI of the brain, cervical spine, she does has brisk reflex, rule out cervical spondylitic myelopathy  Repeat EEG,  Inflammatory markers    DIAGNOSTIC DATA (LABS, IMAGING, TESTING) - I reviewed patient records, labs, notes, testing and imaging myself where available.   MEDICAL HISTORY:  Linda English is a 67 year old female, seen in request by primary care physician Dr., Carylon Perches, to follow-up neurological condition, she was seen by [facility] neurologist Dr. Gerilyn Pilgrim, Darleen Crocker, in the past  I reviewed and summarized the referring note. PMHx DM Bipolar HLD HTN Obstructive sleep apnea, could not tolerate CPAP, History of subdural hematoma  Cervical decompression surgery, presenting to chronic neck pain, radiating pain to her left shoulder, gait abnormality Rectal cancer,   Lumbar decompression surgery. Hx of rectal cancer, s/p surgery 2003, and 2003, ileostomy and redo,  did not require radiation and chemo   She presented to [facility] emergency room on February 20, 2021 after fell at home that was witnessed by her daughter, she suffered long depression anxiety, the day of emergency presentation, she was rushing to the refrigerator end up fell, daughter and daughter witnessed the episode, reported patient was very anxious, impending  She also complains of intermittent low back  pain, as it knife is currently through her back, bilateral hands and feet paresthesia, difficult to keep her train of thoughts, short-term memory loss, frequent lightheadedness dizziness especially when standing up position,  She is already on lamotrigine 200 mg twice a day from her psychiatrist, missing her medication sometimes, complains of racing thoughts, auditory and visual hallucinations  UPDATE Aug 17 2023: Accompanied by her mother at today's clinical visit, Note from her daughter, described moments of confusion, during those moments, she could be doing repetitive movement shaking her legs, unsteady gait, sometimes for bumping into things, she there was also excessive movement such as smack her lips like chewing gum,  Patient is also complained of confusion, unsteady gait, feeling nervous,   PHYSICAL EXAM:   Vitals:   08/17/23 0936  BP: (!) 180/101  Pulse: 69  Weight: 136 lb (61.7 kg)  Height: 5\' 1"  (1.549 m)    Body mass index is 25.7 kg/m.  PHYSICAL EXAMNIATION:  Gen: NAD, conversant, well nourised, well groomed                     Cardiovascular: Regular rate rhythm, no peripheral edema, warm, nontender. Eyes: Conjunctivae clear without exudates or hemorrhage Neck: Supple, no carotid bruits. Pulmonary: Clear to auscultation bilaterally   NEUROLOGICAL EXAM:  MENTAL STATUS: Speech/cognition: Anxious looking middle age female,  alert, oriented to history taking and casual conversation     08/16/2022   11:11 AM  Montreal Cognitive Assessment   Visuospatial/ Executive (0/5) 2  Naming (0/3) 3  Attention: Read list of digits (0/2) 1  Attention: Read list of letters (  0/1) 1  Attention: Serial 7 subtraction starting at 100 (0/3) 1  Language: Repeat phrase (0/2) 2  Language : Fluency (0/1) 1  Abstraction (0/2) 2  Delayed Recall (0/5) 5  Orientation (0/6) 6  Total 24  Adjusted Score (based on education) 24       08/17/2023    9:39 AM  MMSE - Mini Mental State  Exam  Orientation to time 5  Orientation to Place 5  Registration 3  Attention/ Calculation 2  Recall 3  Language- name 2 objects 2  Language- repeat 1  Language- follow 3 step command 3  Language- read & follow direction 1  Write a sentence 1  Copy design 0  Total score 26    CRANIAL NERVES: CN II: Visual fields are full to confrontation. Pupils are round equal and briskly reactive to light. CN III, IV, VI: extraocular movement are normal. No ptosis. CN V: Facial sensation is intact to light touch CN VII: Face is symmetric with normal eye closure  CN VIII: Hearing is normal to causal conversation. CN IX, X: Phonation is normal. CN XI: Head turning and shoulder shrug are intact  MOTOR: There is no pronator drift of out-stretched arms. Muscle bulk and tone are normal. Muscle strength is normal.  REFLEXES: Reflexes are 2+ and symmetric at the biceps, triceps, knees, and ankles. Plantar responses are flexor.  SENSORY: Intact to light touch, pinprick and vibratory sensation are intact in fingers and toes.  COORDINATION: There is no trunk or limb dysmetria noted.  GAIT/STANCE: Posture is normal. Gait was cautious Romberg is absent.  REVIEW OF SYSTEMS:  Full 14 system review of systems performed and notable only for as above All other review of systems were negative.   ALLERGIES: Allergies  Allergen Reactions   Lithium Nausea And Vomiting   Diltiazem Other (See Comments)    Low heart rate, extreme fatigue, pain   Excedrin Extra Strength [Aspirin-Acetaminophen-Caffeine] Other (See Comments)    Makes her nervous.   Geodon [Ziprasidone Hcl]     Neurologist discontinued d/t diagnosis   Klonopin [Clonazepam] Other (See Comments)    hallucinations    HOME MEDICATIONS: Current Outpatient Medications  Medication Sig Dispense Refill   acetaminophen (TYLENOL) 500 MG tablet Take 1,000 mg by mouth every 8 (eight) hours as needed for moderate pain or headache.      clobetasol cream (TEMOVATE) 0.05 % Apply topically.     famotidine (PEPCID) 40 MG tablet TAKE 1 TABLET(40 MG) BY MOUTH AT BEDTIME 90 tablet 3   furosemide (LASIX) 40 MG tablet Take 1 tablet (40 mg total) by mouth daily. (Patient taking differently: Take 20 mg by mouth daily.) 30 tablet 1   JARDIANCE 10 MG TABS tablet Take 10 mg by mouth daily.     lamoTRIgine (LAMICTAL) 200 MG tablet Take 200 mg by mouth 2 (two) times daily.     LORazepam (ATIVAN) 1 MG tablet Take 1 mg by mouth 3 (three) times daily as needed for anxiety.     metoprolol succinate (TOPROL-XL) 50 MG 24 hr tablet Take 1 tablet (50 mg total) by mouth daily. Take with or immediately following a meal. 30 tablet 1   nystatin cream (MYCOSTATIN) Apply 1 Application topically 2 (two) times daily.     olmesartan (BENICAR) 40 MG tablet SMARTSIG:1.0 Tablet(s) By Mouth Daily     pantoprazole (PROTONIX) 40 MG tablet Take 1 tablet (40 mg total) by mouth daily. Take 30 minutes before breakfast 90 tablet 3  pravastatin (PRAVACHOL) 20 MG tablet Take 20 mg by mouth at bedtime.     triamcinolone cream (KENALOG) 0.1 % APPLY A THIN LAYER TO THE AFFECTED AREA(S) BY TOPICAL ROUTE 2 TIMES PER DAY     No current facility-administered medications for this visit.    PAST MEDICAL HISTORY: Past Medical History:  Diagnosis Date   Acid reflux    Allergic rhinitis    Anxiety    Arthritis    osteoarthritis   Bipolar disorder (HCC)    Chronic headache    Depression    Diverticula of colon    DM (diabetes mellitus) (HCC)    Gastric erosions    Gastric polyps    benign    Hemorrhoids    Hiatal hernia    Hypertension    IBS (irritable bowel syndrome)    Lichen planus    Obstructive sleep apnea    Panic disorder    Parkinson's disease (HCC)    Rectal cancer (HCC) 2003   ileostomy and reversal   Subdural hematoma (HCC)    Tubular adenoma     PAST SURGICAL HISTORY: Past Surgical History:  Procedure Laterality Date   ABDOMINAL HYSTERECTOMY      APPENDECTOMY     BACK SURGERY     BIOPSY  10/01/2019   Procedure: BIOPSY;  Surgeon: Corbin Ade, MD;  [facility];  Service: Endoscopy;;  gastric   CESAREAN SECTION     X  2   CHOLECYSTECTOMY     COLONOSCOPY  08/2007   DR Jena Gauss, friable anal canal hemorrhoids, surgical anastomosis at 3cm, distal scattered tics   COLONOSCOPY  12/15/2010   anal papilla and internal hemorrhoids/diminutive polyp in the base of the cecum. Past, tubular adenoma.. Next colonoscopy due in April 2017.   COLONOSCOPY  10/27/2005   RMR: Anal canal hemorrhoids. Surgical anastomosis at 3 cm from the anal verge appeared normal. Few scattered distal diverticula. The residual  colonic mucosa appeared normal. I suspect the patient bled from hemorrhoids   COLONOSCOPY N/A 01/15/2015   XBM:WUXLKG residual rectum and colon. next tcs 12/2019   COLONOSCOPY WITH PROPOFOL N/A 05/01/2020   Procedure: COLONOSCOPY WITH PROPOFOL;  Surgeon: Corbin Ade, MD;  Two 3-8 millimeters polyps in ascending colon, diverticulosis in descending colon, surgical anastomosis at 4 cm, otherwise normal exam.  Pathology with tubular adenomas.  Repeat colonoscopy in 5 years.   ESOPHAGEAL DILATION N/A 06/27/2015   Procedure: ESOPHAGEAL DILATION;  Surgeon: Corbin Ade, MD;  [facility];  Service: Endoscopy;  Laterality: N/A;   ESOPHAGOGASTRODUODENOSCOPY  05/2002   DR Jena Gauss, normal   ESOPHAGOGASTRODUODENOSCOPY  08/03/2011   RMR: small HH/ gastric polyps   ESOPHAGOGASTRODUODENOSCOPY N/A 06/27/2015   Dr. Jena Gauss: Mild erosive reflux esophagitits. Status post passage of a Maloney dilator. Hiatal hernia. Gastric polyps and abnormal gastric mucosa of doubtful clinical significane. status post biopsy, benign fundic gland polyp, negative H.pylori   ESOPHAGOGASTRODUODENOSCOPY (EGD) WITH PROPOFOL N/A 10/01/2019   rourk: Normal esophagus status post esophageal dilation for dysphagia, erythematous mucosa in the stomach biopsy consistent with  reactive gastropathy, no H. pylori, multiple gastric polyps (biopsy fundic gland)   low anterior rection  2003   RECTAL CANCER   MALONEY DILATION N/A 10/01/2019   Procedure: MALONEY DILATION;  Surgeon: Corbin Ade, MD;  [facility];  Service: Endoscopy;  Laterality: N/A;   POLYPECTOMY  05/01/2020   Procedure: POLYPECTOMY;  Surgeon: Corbin Ade, MD;  [facility];  Service: Endoscopy;;   SPINE SURGERY  2001   L5,S1 HEMILAMINOTOMY AND DISCECTOMY/DR DEATON    FAMILY HISTORY: Family History  Problem Relation Age of Onset   Colon cancer Paternal Grandfather 86   Colon polyps Brother    Colon polyps Cousin    Diabetes Mother    Hypertension Mother    Hypertension Father    Heart attack Father    Heart failure Father     SOCIAL HISTORY: Social History   Socioeconomic History   Marital status: Divorced    Spouse name: Not on file   Number of children: 2   Years of education: 12   Highest education level: 12th grade  Occupational History   Occupation: Magazine features editor: RETIRED    Comment: [employer] [address]  Tobacco Use   Smoking status: Never    Passive exposure: Never   Smokeless tobacco: Never   Tobacco comments:    Never smoked  Vaping Use   Vaping status: Never Used  Substance and Sexual Activity   Alcohol use: No   Drug use: No   Sexual activity: Not Currently    Partners: Male  Other Topics Concern   Not on file  Social History Narrative   Not on file   Social Drivers of Health   Financial Resource Strain: Low Risk  (04/08/2022)   Overall Financial Resource Strain (CARDIA)    Difficulty of Paying Living Expenses: Not hard at all  Food Insecurity: No Food Insecurity (04/08/2022)   Hunger Vital Sign    Worried About Running Out of Food in the Last Year: Never true    Ran Out of Food in the Last Year: Never true  Transportation Needs: Unmet Transportation Needs (04/08/2022)   PRAPARE - Administrator, Civil Service (Medical):  Yes    Lack of Transportation (Non-Medical): No  Physical Activity: Inactive (04/08/2022)   Exercise Vital Sign    Days of Exercise per Week: 0 days    Minutes of Exercise per Session: 0 min  Stress: No Stress Concern Present (04/08/2022)   [employer] of Occupational Health - Occupational Stress Questionnaire    Feeling of Stress : Only a little  Social Connections: Moderately Isolated (04/08/2022)   Social Connection and Isolation Panel [NHANES]    Frequency of Communication with Friends and Family: More than three times a week    Frequency of Social Gatherings with Friends and Family: More than three times a week    Attends Religious Services: More than 4 times per year    Active Member of [employer] or Organizations: No    Attends Banker Meetings: Never    Marital Status: Divorced  Catering manager Violence: Not At Risk (04/08/2022)   Humiliation, Afraid, Rape, and Kick questionnaire    Fear of Current or Ex-Partner: No    Emotionally Abused: No    Physically Abused: No    Sexually Abused: No      Levert Feinstein, M.D. Ph.D.  [facility] [address] [place], [place] [address] Ph: [phone] Fax: [phone]  CC:  Carylon Perches, MD [address] [place],  [place] [address]  Carylon Perches, MD

## 2023-08-17 NOTE — Telephone Encounter (Signed)
[facility] will be taking this patient next week.

## 2023-08-18 ENCOUNTER — Telehealth: Payer: Self-pay

## 2023-08-18 LAB — COMPREHENSIVE METABOLIC PANEL
ALT: 18 [IU]/L (ref 0–32)
AST: 21 [IU]/L (ref 0–40)
Albumin: 4.7 g/dL (ref 3.9–4.9)
Alkaline Phosphatase: 138 [IU]/L — ABNORMAL HIGH (ref 44–121)
BUN/Creatinine Ratio: 11 — ABNORMAL LOW (ref 12–28)
BUN: 15 mg/dL (ref 8–27)
Bilirubin Total: 0.4 mg/dL (ref 0.0–1.2)
CO2: 23 mmol/L (ref 20–29)
Calcium: 9.9 mg/dL (ref 8.7–10.3)
Chloride: 100 mmol/L (ref 96–106)
Creatinine, Ser: 1.41 mg/dL — ABNORMAL HIGH (ref 0.57–1.00)
Globulin, Total: 3 g/dL (ref 1.5–4.5)
Glucose: 89 mg/dL (ref 70–99)
Potassium: 4.1 mmol/L (ref 3.5–5.2)
Sodium: 141 mmol/L (ref 134–144)
Total Protein: 7.7 g/dL (ref 6.0–8.5)
eGFR: 41 mL/min/{1.73_m2} — ABNORMAL LOW (ref >59)

## 2023-08-18 LAB — THYROID PANEL WITH TSH
Free Thyroxine Index: 3 (ref 1.2–4.9)
T3 Uptake Ratio: 28 % (ref 24–39)
T4, Total: 10.8 ug/dL (ref 4.5–12.0)
TSH: 2 u[IU]/mL (ref 0.450–4.500)

## 2023-08-18 LAB — C-REACTIVE PROTEIN: CRP: 14 mg/L — ABNORMAL HIGH (ref 0–10)

## 2023-08-18 LAB — SEDIMENTATION RATE: Sed Rate: 13 mm/h (ref 0–40)

## 2023-08-18 LAB — LAMOTRIGINE LEVEL: Lamotrigine Lvl: 18.7 ug/mL (ref 2.0–20.0)

## 2023-08-19 ENCOUNTER — Other Ambulatory Visit: Payer: Self-pay

## 2023-08-19 DIAGNOSIS — Z79899 Other long term (current) drug therapy: Secondary | ICD-10-CM

## 2023-08-22 ENCOUNTER — Encounter: Payer: Self-pay | Admitting: *Deleted

## 2023-08-22 NOTE — Patient Outreach (Signed)
  Care Coordination   Documentation Note   08/22/2023 Name: NAVAYA WEISHEIT MRN: [id number] DOB: [birth year]  NGUYEN SALAIZ is a 67 y.o. year old female who sees Carylon Perches, MD for primary care. I  spoke with Dr Alonza Smoker office staff at an in person visit on 08/19/23. They relayed that Ms Pollins needs assistance with Jardiance.   What matters to the patients health and wellness today?  I did not speak with the patient   SDOH assessments and interventions completed:  No     Care Coordination Interventions:  Yes, provided  Interventions Today    Flowsheet Row Most Recent Value  Chronic Disease   Chronic disease during today's visit Diabetes  General Interventions   General Interventions Discussed/Reviewed General Interventions Reviewed, Communication with  Communication with PCP/Specialists  [In person visit with Dr Alonza Smoker office on 08/19/23 . Patient needs assistance with Jardiance. Referral was received on 08/19/23 after I provided the office with a copy of the referral form and referral transferred to [facility] Team on 08/22/23]  Pharmacy Interventions   Pharmacy Dicussed/Reviewed Pharmacy Topics Discussed, Affording Medications  Freada Bergeron has been sent to [facility] Team. Secure fax sent to Dr Alonza Smoker office with this information. Plan to update at next practice visit on 08/29/23.]       Follow up plan:  Practice visit planned for 08/29/23. Will follow-up with staff then communicate with [facility] Team regarding status if necessary.     Encounter Outcome:  Patient Visit Completed   Demetrios Loll, RN, BSN Care Manager [facility]  Population Health  Direct Dial: [phone] Main #: [phone]

## 2023-08-29 ENCOUNTER — Telehealth: Payer: Self-pay | Admitting: Pharmacist

## 2023-08-29 NOTE — Progress Notes (Signed)
   08/29/2023  Patient ID: Linda English, female   DOB: [birth year], 67 y.o.   MRN: [id number]    08/29/2023  Linda English 08/27/1956 [id number]  Reason for referral: medication assistance  Referral source: Provider Referral medication(s): Jardiance Current insurance:[employer]   Assessment:   Medication Assistance Findings:  Medication assistance needs identified: Patient expressed concern over affording Jardiance. She said the price increased to over $100 in the last few months.  She was educated about the Medicare donut hole as well as the upcoming changes with Medicare in 2025.  She was encouraged to get a refill of Jardiance on August 31, 2023 as the new benefit year will start.  A brief financial assessment was completed.She appears to qualify for medication assistance with [facility]'s  patient assistance program.  She communicated understanding on necessary financial paperwork.     Additional medication assistance options reviewed with patient as warranted:  No other options identified  Plan: I will route patient assistance letter to [facility] pharmacy technician who will coordinate patient assistance program application process for medications listed above.  [facility] pharmacy technician will assist with obtaining all required documents from both patient and provider(s) and submit application(s) once completed.    Beecher Mcardle, PharmD, BCACP [facility] Clinical Pharmacist [phone]

## 2023-09-01 ENCOUNTER — Telehealth: Payer: Self-pay | Admitting: Neurology

## 2023-09-01 NOTE — Telephone Encounter (Signed)
 Pt states she was due to be scheduled for 2 MRI's and an EEG two weeks ago but hasn't heard anything in regards to getting scheduled. Requesting call back

## 2023-09-01 NOTE — Telephone Encounter (Signed)
 AON order completed and placed in Dr. Zannie Cove inbox for signature.

## 2023-09-01 NOTE — Telephone Encounter (Signed)
 Both MRIs Ethlyn Gallery: [id number] exp. 09/01/23-10/31/23 sent to [facility] [phone]

## 2023-09-02 ENCOUNTER — Other Ambulatory Visit: Payer: Self-pay | Admitting: Gastroenterology

## 2023-09-02 DIAGNOSIS — K219 Gastro-esophageal reflux disease without esophagitis: Secondary | ICD-10-CM

## 2023-09-13 NOTE — Progress Notes (Signed)
 Office Visit Note  Patient: Linda English             Date of Birth: Jan 21, 1956           MRN: [id number]             PCP: Artemisa Bile, MD Referring: Artelia Laroche Visit Date: 09/14/2023 Occupation: Former Runner, broadcasting/film/video  Subjective:  New Patient (Initial Visit) (Patient states she has joint pain in her hands and left ankle. )   Discussed the use of AI scribe software for clinical note transcription with the patient, who gave verbal consent to proceed.  History of Present Illness   Linda English is a 68 y.o. female here for evaluation of positive ANA labs checked last year in associated with elevated alkaline phosphatase as well ash joint pain and swelling in hands and feet. 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 The patient reported a history of hand pain, described as a constant ache, which has been ongoing for approximately two years. The patient noted that the hands sometimes appear raised and feel more than just achy, with a tingly sensation. The patient also reported occasional numbness in the hands and intermittent difficulty with grip strength, such as opening jars. The patient noted occasional discoloration in the fingers, with the tips sometimes turning blue, particularly when the hands hurt. The patient reported using aspirin  cream for joint pain relief. She also reported wearing elastic arthritis gloves that are partially helpful.  The patient also reported intermittent instability in the right ankle, which has been ongoing for an unspecified duration. The patient described the sensation as the ankle giving out, particularly when stepping down or up.   The patient also reported a history of Parkinson's syndrome, which had been induced by medication (Geodon). The patient reported significant improvement in symptoms after discontinuation of the medication, including the ability to walk again.  The patient also reported a history  of an autoimmune condition causing lichen sclerosus, which causes swelling and dark marks on the skin, particularly on the arms. The patient reported that these marks sometimes go away but often persist.  The patient denied any gastrointestinal symptoms but did mention a diagnosis of irritable bowel syndrome. The patient denied any history of blood clots, bleeding problems, or significant skin rashes.   Labs reviewed 02/2023 ANA 1:40 homogenous, speckled Smooth Muscle Ab 1:40   Activities of Daily Living:  Patient reports morning stiffness for 0 minute.   Patient Reports nocturnal pain.  Difficulty dressing/grooming: Denies Difficulty climbing stairs: Reports Difficulty getting out of chair: Reports Difficulty using hands for taps, buttons, cutlery, and/or writing: Denies  Review of Systems  Constitutional:  Negative for fatigue.  HENT:  Positive for mouth sores and mouth dryness.   Eyes:  Negative for dryness.  Respiratory:  Negative for shortness of breath.   Cardiovascular:  Negative for chest pain and palpitations.  Gastrointestinal:  Positive for constipation and diarrhea. Negative for blood in stool.  Endocrine: Positive for increased urination.  Genitourinary:  Negative for involuntary urination.  Musculoskeletal:  Positive for joint pain, gait problem, joint pain, myalgias and myalgias. Negative for joint swelling, muscle weakness, morning stiffness and muscle tenderness.  Skin:  Positive for color change and rash. Negative for hair loss and sensitivity to sunlight.  Allergic/Immunologic: Negative for susceptible to infections.  Neurological:  Positive for dizziness and headaches.  Hematological:  Negative for swollen glands.  Psychiatric/Behavioral:  Positive for depressed mood and sleep  disturbance. The patient is nervous/anxious.     PMFS History:  Patient Active Problem List   Diagnosis Date Noted   Bilateral hand pain 09/14/2023   Right shoulder pain 09/14/2023    Positive ANA (antinuclear antibody) 09/14/2023   Long term (current) use of oral hypoglycemic drugs 04/05/2023   Paresthesia 08/16/2022   Gait abnormality 08/16/2022   Loss of consciousness (HCC) 08/16/2022   Elevated LFTs 08/10/2022   RUQ abdominal pain 09/11/2020   History of rectal cancer 02/15/2020   Alternating constipation and diarrhea 02/15/2020   Underweight 02/12/2020   Sensory disturbance 01/18/2020   Exertional dyspnea 01/17/2020   Hypokalemia 01/17/2020   Acute diastolic CHF (congestive heart failure) (HCC) 01/17/2020   Acute respiratory failure with hypoxia (HCC) 01/17/2020   Hypoxia    Hypercortisolemia (HCC) 12/11/2019   Protein-calorie malnutrition, severe 10/25/2019   Acute renal failure superimposed on stage 3a chronic kidney disease (HCC) 10/24/2019   Intractable vomiting with nausea 10/24/2019   Acute renal failure superimposed on stage 3 chronic kidney disease (HCC) 10/24/2019   Acute metabolic encephalopathy 10/23/2019   Vertigo as late effect of stroke 08/13/2019   Dizziness 08/13/2019   DKA (diabetic ketoacidosis) (HCC) 08/12/2019   Rt Sided Subdural hematoma, post-traumatic  07/02/2019   AKI (acute kidney injury) (HCC) 06/26/2019   ARF (acute renal failure) (HCC) 06/24/2019   Nausea & vomiting 06/05/2019   Diarrhea 07/27/2017   Transient ischemic attack 08/09/2015   Reflux esophagitis    Gastric polyp    Dyspepsia 06/13/2015   Dysphagia 06/13/2015   Incisional irritation 06/18/2013   Wound drainage 06/18/2013   Bipolar 1 disorder (HCC) 06/08/2012    Class: Chronic   Anorexia 05/23/2012   Insomnia 05/23/2012   Weight loss 05/23/2012   Abnormal weight loss 04/10/2012   Esophageal dysphagia 04/10/2012   Nausea 09/08/2011   GERD (gastroesophageal reflux disease) 07/14/2011   Epigastric pain 07/14/2011   Constipation 07/14/2011   Knee pain 02/09/2011   OA (osteoarthritis) of knee 02/09/2011   Family hx of colon cancer 11/30/2010   Bowel habit  changes 11/30/2010   OSTEOARTHRITIS, KNEE 08/05/2010   PES PLANUS 08/05/2010   RECTAL CANCER 10/23/2007   Diabetes (HCC) 10/23/2007   Mixed hyperlipidemia 10/23/2007   Anxiety state 10/23/2007   PANIC DISORDER 10/23/2007   DEPRESSION 10/23/2007   OBSTRUCTIVE SLEEP APNEA 10/23/2007   Essential hypertension, benign 10/23/2007   ALLERGIC RHINITIS 10/23/2007   IBS 10/23/2007   HEADACHE, CHRONIC 10/23/2007    Past Medical History:  Diagnosis Date   Acid reflux    Allergic rhinitis    Anxiety    Arthritis    osteoarthritis   Bipolar disorder (HCC)    Chronic headache    Depression    Diverticula of colon    DM (diabetes mellitus) (HCC)    Gastric erosions    Gastric polyps    benign    Hemorrhoids    Hiatal hernia    Hypertension    IBS (irritable bowel syndrome)    Lichen planus    Obstructive sleep apnea    Panic disorder    Parkinson's disease (HCC)    Rectal cancer (HCC) 2003   ileostomy and reversal   Subdural hematoma (HCC)    Tubular adenoma     Family History  Problem Relation Age of Onset   Diabetes Mother    Hypertension Mother    Hypertension Father    Heart attack Father    Heart failure Father    Colon  polyps Brother    Colon cancer Paternal Grandfather 90   Colon polyps Cousin    Past Surgical History:  Procedure Laterality Date   ABDOMINAL HYSTERECTOMY     APPENDECTOMY     BACK SURGERY     BIOPSY  10/01/2019   Procedure: BIOPSY;  Surgeon: Suzette Espy, MD;  [facility];  Service: Endoscopy;;  gastric   CESAREAN SECTION     X  2   CHOLECYSTECTOMY     COLONOSCOPY  08/2007   DR Riley Cheadle, friable anal canal hemorrhoids, surgical anastomosis at 3cm, distal scattered tics   COLONOSCOPY  12/15/2010   anal papilla and internal hemorrhoids/diminutive polyp in the base of the cecum. Past, tubular adenoma.. Next colonoscopy due in April 2017.   COLONOSCOPY  10/27/2005   RMR: Anal canal hemorrhoids. Surgical anastomosis at 3 cm from the anal  verge appeared normal. Few scattered distal diverticula. The residual  colonic mucosa appeared normal. I suspect the patient bled from hemorrhoids   COLONOSCOPY N/A 01/15/2015   WUJ:WJXBJY residual rectum and colon. next tcs 12/2019   COLONOSCOPY WITH PROPOFOL  N/A 05/01/2020   Procedure: COLONOSCOPY WITH PROPOFOL ;  Surgeon: Suzette Espy, MD;  Two 3-8 millimeters polyps in ascending colon, diverticulosis in descending colon, surgical anastomosis at 4 cm, otherwise normal exam.  Pathology with tubular adenomas.  Repeat colonoscopy in 5 years.   ESOPHAGEAL DILATION N/A 06/27/2015   Procedure: ESOPHAGEAL DILATION;  Surgeon: Suzette Espy, MD;  [facility]: [facility];  Service: Endoscopy;  Laterality: N/A;   ESOPHAGOGASTRODUODENOSCOPY  05/2002   DR Riley Cheadle, normal   ESOPHAGOGASTRODUODENOSCOPY  08/03/2011   RMR: small HH/ gastric polyps   ESOPHAGOGASTRODUODENOSCOPY N/A 06/27/2015   Dr. Riley Cheadle: Mild erosive reflux esophagitits. Status post passage of a Maloney dilator. Hiatal hernia. Gastric polyps and abnormal gastric mucosa of doubtful clinical significane. status post biopsy, benign fundic gland polyp, negative H.pylori   ESOPHAGOGASTRODUODENOSCOPY (EGD) WITH PROPOFOL  N/A 10/01/2019   rourk: Normal esophagus status post esophageal dilation for dysphagia, erythematous mucosa in the stomach biopsy consistent with reactive gastropathy, no H. pylori, multiple gastric polyps (biopsy fundic gland)   low anterior rection  2003   RECTAL CANCER   MALONEY DILATION N/A 10/01/2019   Procedure: MALONEY DILATION;  Surgeon: Suzette Espy, MD;  [facility];  Service: Endoscopy;  Laterality: N/A;   POLYPECTOMY  05/01/2020   Procedure: POLYPECTOMY;  Surgeon: Suzette Espy, MD;  [facility];  Service: Endoscopy;;   SPINE SURGERY  2001   L5,S1 HEMILAMINOTOMY AND DISCECTOMY/DR DEATON   Social History   Social History Narrative   Not on file   Immunization History  Administered Date(s)  Administered   Influenza,inj,Quad PF,6+ Mos 06/25/2019   Pneumococcal Polysaccharide-23 08/13/2019     Objective: Vital Signs: BP (!) 168/85 (BP Location: Right Arm, Patient Position: Sitting, Cuff Size: Normal)   Pulse 63   Resp 14   Ht 5\' 2"  (1.575 m)   Wt 136 lb (61.7 kg)   BMI 24.87 kg/m    Physical Exam Eyes:     Conjunctiva/sclera: Conjunctivae normal.  Cardiovascular:     Rate and Rhythm: Normal rate and regular rhythm.  Pulmonary:     Effort: Pulmonary effort is normal.     Breath sounds: Normal breath sounds.  Musculoskeletal:     Right lower leg: No edema.     Left lower leg: No edema.  Lymphadenopathy:     Cervical: No cervical adenopathy.  Skin:    General: Skin is warm and dry.     Findings: Rash present.     Comments: Scattered round, hyperpigmented 1-2 cm thickened on arms and legs  Neurological:     Mental Status: She is alert.  Psychiatric:        Mood and Affect: Mood normal.      Musculoskeletal Exam:  Right shoulder restricted overhead abduction, tender to pressure, no palpable effusion Tenderness to pressure over right scapula and paraspinal muscles Elbows full ROM no tenderness or swelling Wrists full ROM no tenderness or swelling, right wrist bony dorsal nodule Fingers full ROM, heberdon;s nodes on 1st IP joints and 2nd DIP joints b/l and right 3rd DIP Knees full ROM no tenderness or swelling Ankles full ROM no tenderness or swelling   Investigation: No additional findings.  Imaging: XR Shoulder Right Result Date: 09/14/2023 X-ray right shoulder 4 views Normal humerus position and internal and external rotation.  Glenohumeral joint space is preserved but marginal osteophytes on the humerus.  AC joint space is preserved.  Enthesophyte processes on lateral superior border of acromion.  No visible joint effusion or abnormal calcifications seen.  No acute appearing bony abnormality. Impression Mild to moderate degenerative changes throughout  including glenohumeral joint, no current visible effusion or erosive disease changes  XR Hand 2 View Right Result Date: 09/14/2023 X-ray right hand 2 views Radiocarpal joint space appears normal.  Ulnar styloid absent.  Few cystic changes present in the carpal bones most noticeable at the trapezium.  Moderate first CMC joint degenerative changes without subluxation.  Numerous subchondral cyst and marginal bone spurring at first and second MCP joints.  Also extensive osteophytes throughout 1st through 3rd PIPs and in second third DIPs.  No erosions or abnormal calcifications seen.  Bone mineralization appears normal. Impression Generalized osteoarthritis most advanced in the first 3 digits, prominent disease at MCPs can be seen secondary to chronic metabolic or inflammatory degenerative process but nonspecific and favored generalized OA  XR Hand 2 View Left Result Date: 09/14/2023 X-ray left hand 2 views Radiocarpal joint space appears normal.  Cystic changes present throughout carpal bones largest in lunate.  Moderate degenerative change at first [facility] joint without significant subluxation.  Significant appearing osteophytes present at 1st through 3rd MCP joints very slight joint narrowing.  Also marginal osteophytes and asymmetric joint space narrowing at first IP and second and third DIPs.  No erosions or abnormal calcifications seen.  Bone mineralization appears normal. Impression Generalized osteoarthritis but most advanced in first 3 digits   Recent Labs: Lab Results  Component Value Date   WBC 3.9 (L) 08/07/2023   HGB 14.0 08/07/2023   PLT 306 08/07/2023   NA 141 08/17/2023   K 4.1 08/17/2023   CL 100 08/17/2023   CO2 23 08/17/2023   GLUCOSE 89 08/17/2023   BUN 15 08/17/2023   CREATININE 1.41 (H) 08/17/2023   BILITOT 0.4 08/17/2023   ALKPHOS 138 (H) 08/17/2023   AST 21 08/17/2023   ALT 18 08/17/2023   PROT 7.7 08/17/2023   ALBUMIN 4.7 08/17/2023   CALCIUM  9.9 08/17/2023   GFRAA 55  (L) 08/11/2020    Speciality Comments: No specialty comments available.  Procedures:  No procedures performed Allergies: Lithium , Diltiazem , Excedrin extra strength [aspirin -acetaminophen -caffeine], Geodon [ziprasidone hcl], and Klonopin  [clonazepam ]   Assessment / Plan:     Visit Diagnoses: Positive ANA (antinuclear antibody) - Plan: ANA, Anti-DNA antibody, double-stranded, Sjogrens syndrome-A extractable nuclear antibody, Sjogrens syndrome-B extractable nuclear antibody, Anti-Smith  antibody, RNP Antibody, C3 and C4 Positive ANA at low titer no specific clinical findings on exam today. -Will check ANA level again in case transient positive also specific antibody markers.  Bilateral hand pain - Plan: XR Hand 2 View Right, XR Hand 2 View Left, Sedimentation rate, Rheumatoid factor Chronic hand pain with intermittent swelling and numbness. No significant grip weakness.  X-ray of bilateral hands shows generalized osteoarthritis. -Provided printed handout also discussed information on symptomatic management.  Not a good candidate for oral systemic NSAIDs due to chronic kidney disease and congestive heart failure history.  Should be okay with topicals also discussed supplemental treatments.   -Pretty extensive proximal joint involvement especially second and third MCPs could be seen with metabolic disease. If current lab work unremarkable would probably benefit from bone densitometry screening and if normal consider additional metabolic workup.  Abnormal Liver Enzymes Referred by gastroenterology due to abnormal liver enzymes. Per last note reviewed no evidence of autoimmune liver disease. Persistent Alk phos elevation could be secondary to bone or muscle source. Parkinson's Syndrome History of medication-induced Parkinson's syndrome, now resolved after discontinuation of Geodon. -No current intervention required as symptoms have resolved.  Right Shoulder Pain Right shoulder held forward,  possibly due to osteoarthritis or rotator cuff problem.  X-ray of right shoulder obtained in clinic does suggest generalized degenerative changes no evidence of inflammatory disease or calcification.  -Provided some strengthening and range of motion exercise recommendations. -If failure to improve could be candidate for formal physical therapy or trial of intra-articular steroid injection.    Orders: Orders Placed This Encounter  Procedures   XR Shoulder Right   XR Hand 2 View Right   XR Hand 2 View Left   ANA   Anti-DNA antibody, double-stranded   Sjogrens syndrome-A extractable nuclear antibody   Sjogrens syndrome-B extractable nuclear antibody   Anti-Smith antibody   RNP Antibody   C3 and C4   Sedimentation rate   Rheumatoid factor   No orders of the defined types were placed in this encounter.    Follow-Up Instructions: No follow-ups on file.   Matt Song, MD  Note - This record has been created using [employer].  Chart creation errors have been sought, but may not always  have been located. Such creation errors do not reflect on  the standard of medical care.

## 2023-09-14 ENCOUNTER — Ambulatory Visit: Payer: Medicare PPO | Attending: Internal Medicine | Admitting: Internal Medicine

## 2023-09-14 ENCOUNTER — Ambulatory Visit: Payer: Medicare PPO

## 2023-09-14 ENCOUNTER — Encounter: Payer: Self-pay | Admitting: Internal Medicine

## 2023-09-14 VITALS — BP 168/85 | HR 63 | Resp 14 | Ht 62.0 in | Wt 136.0 lb

## 2023-09-14 DIAGNOSIS — M79642 Pain in left hand: Secondary | ICD-10-CM | POA: Diagnosis not present

## 2023-09-14 DIAGNOSIS — M79641 Pain in right hand: Secondary | ICD-10-CM

## 2023-09-14 DIAGNOSIS — R768 Other specified abnormal immunological findings in serum: Secondary | ICD-10-CM | POA: Insufficient documentation

## 2023-09-14 DIAGNOSIS — G8929 Other chronic pain: Secondary | ICD-10-CM | POA: Diagnosis not present

## 2023-09-14 DIAGNOSIS — M25511 Pain in right shoulder: Secondary | ICD-10-CM | POA: Diagnosis not present

## 2023-09-14 NOTE — Patient Instructions (Signed)

## 2023-09-15 ENCOUNTER — Ambulatory Visit (HOSPITAL_COMMUNITY)
Admission: RE | Admit: 2023-09-15 | Discharge: 2023-09-15 | Disposition: A | Discharge status: Home / Self Care | Payer: Medicare PPO | Source: Ambulatory Visit | Attending: Neurology | Admitting: Neurology

## 2023-09-15 DIAGNOSIS — I10 Essential (primary) hypertension: Secondary | ICD-10-CM | POA: Diagnosis present

## 2023-09-15 DIAGNOSIS — R269 Unspecified abnormalities of gait and mobility: Secondary | ICD-10-CM

## 2023-09-15 DIAGNOSIS — F319 Bipolar disorder, unspecified: Secondary | ICD-10-CM | POA: Diagnosis present

## 2023-09-15 DIAGNOSIS — R4020 Unspecified coma: Secondary | ICD-10-CM | POA: Insufficient documentation

## 2023-09-15 DIAGNOSIS — R202 Paresthesia of skin: Secondary | ICD-10-CM | POA: Insufficient documentation

## 2023-09-15 MED ORDER — GADOBUTROL 1 MMOL/ML IV SOLN
6.0000 mL | Freq: Once | INTRAVENOUS | Status: AC | PRN
  Administered 2023-09-15: 6 mL via INTRAVENOUS

## 2023-09-16 LAB — RHEUMATOID FACTOR: Rheumatoid fact SerPl-aCnc: <10 [IU]/mL (ref <14)

## 2023-09-16 LAB — ANTI-SMITH ANTIBODY: ENA SM Ab Ser-aCnc: <1 AI

## 2023-09-16 LAB — SEDIMENTATION RATE: Sed Rate: 11 mm/h (ref 0–30)

## 2023-09-16 LAB — ANTI-NUCLEAR AB-TITER (ANA TITER): ANA Titer 1: 1:40 {titer} — ABNORMAL HIGH

## 2023-09-16 LAB — SJOGRENS SYNDROME-B EXTRACTABLE NUCLEAR ANTIBODY: SSB (La) (ENA) Antibody, IgG: <1 AI

## 2023-09-16 LAB — C3 AND C4
C3 Complement: 143 mg/dL (ref 83–193)
C4 Complement: 40 mg/dL (ref 15–57)

## 2023-09-16 LAB — ANA: Anti Nuclear Antibody (ANA): POSITIVE — AB

## 2023-09-16 LAB — SJOGRENS SYNDROME-A EXTRACTABLE NUCLEAR ANTIBODY: SSA (Ro) (ENA) Antibody, IgG: <1 AI

## 2023-09-16 LAB — ANTI-DNA ANTIBODY, DOUBLE-STRANDED: ds DNA Ab: <1 [IU]/mL

## 2023-09-16 LAB — RNP ANTIBODY: Ribonucleic Protein(ENA) Antibody, IgG: <1 AI

## 2023-09-17 ENCOUNTER — Encounter: Payer: Self-pay | Admitting: Internal Medicine

## 2023-09-17 ENCOUNTER — Encounter: Payer: Self-pay | Admitting: Neurology

## 2023-09-19 ENCOUNTER — Telehealth: Payer: Self-pay

## 2023-09-19 DIAGNOSIS — Z780 Asymptomatic menopausal state: Secondary | ICD-10-CM

## 2023-09-19 DIAGNOSIS — Z13820 Encounter for screening for osteoporosis: Secondary | ICD-10-CM

## 2023-09-19 NOTE — Telephone Encounter (Signed)
Patient states she would like to get the bone density scan done in [place] if possible. Patient states she has not had a bone density scan done before. Per Dr. Dimple Casey, okay to order bone density for Osteoporosis.

## 2023-09-19 NOTE — Progress Notes (Signed)
ANA test was positive at 1: 40 this is the same result as she had back in July last year.  The other results were all negative for more specific disease markers.  I do not think the very low positive antibody test is causing any problems.  She was also negative for markers of widespread inflammation or for rheumatoid arthritis.  X-rays of her shoulder and hands demonstrated osteoarthritis.  For hand pain treatment would be just symptomatic like we discussed.  If the shoulder pain remains bad we could also try treating with a local steroid injection or referring to physical therapy.  If she has not already I recommend she get a bone density test especially based on her other abnormal labs.

## 2023-09-19 NOTE — Telephone Encounter (Signed)
Call to patient, she verbalized understanding of results and inquired on ambulatory EEG. Advised I would send email for an update and reach back out. Patient appreciative of call  Email sent to [facility]

## 2023-09-19 NOTE — Telephone Encounter (Signed)
Continuation of care order faxed to [facility]

## 2023-09-19 NOTE — Telephone Encounter (Signed)
Now addressed in associated result note.

## 2023-09-19 NOTE — Telephone Encounter (Signed)
-----   Message from Jamesetta [place] [facility] sent at 09/19/2023  7:58 AM EST ----- ANA test was positive at 1: 40 this is the same result as she had back in July last year.  The other results were all negative for more specific disease markers.  I do not think the very low positive antibody test is causing any problems.  She was al so negative for markers of widespread inflammation or for rheumatoid arthritis.  X-rays of her shoulder and hands demonstrated osteoarthritis.  For hand pain treatment would be just symptomatic like we discussed.  If the shoulder pain remains bad we could also try treating with a local steroid injection or referring to physical t herapy.  If she has not already I recommend she get a bone density test especially based on her other abnormal labs.

## 2023-09-22 ENCOUNTER — Telehealth: Payer: Self-pay | Admitting: *Deleted

## 2023-09-22 ENCOUNTER — Encounter: Payer: Self-pay | Admitting: *Deleted

## 2023-09-22 NOTE — Patient Outreach (Signed)
  Care Coordination   Documentation  Visit Note   09/22/2023 Name: Linda English MRN: [id number] DOB: 09/17/1955  Linda English is a 68 y.o. year old female who sees Linda Perches, MD for primary care. I spoke with  Linda English by phone today.  What matters to the patients health and wellness today?  Getting prescription assistance for Linda English. Out of pocket cost was $140.    Goals Addressed             This Visit's Progress    Obtain Patient Assistance for Linda English       Care Management Goals: Patient will talk with Linda English Pharmacy Technician regarding application for patient assistance for Linda English Patient will reach out to RN Care Manager at [phone] with any resource or care management needs        SDOH assessments and interventions completed:  Yes  SDOH Interventions Today    Flowsheet Row Most Recent Value  SDOH Interventions   Financial Strain Interventions Other (Comment)  [difficulty with medication cost. Has been referred to Pharmacy Team.]        Care Coordination Interventions:  Yes, provided  Interventions Today    Flowsheet Row Most Recent Value  Chronic Disease   Chronic disease during today's visit Diabetes, Congestive Heart Failure (CHF)  General Interventions   General Interventions Discussed/Reviewed Communication with, General Interventions Reviewed, General Interventions Discussed  Communication with Pharmacists  [Phone call with Linda English, PharmD Re: referral for patient assistance for Linda English. Linda English talked with patient on 08/29/23 and assessed that she likely would qualify. Linda English plans to pass information to pharmacy tech who will call patient to complete.]  Education Interventions   Education Provided Provided Education  Provided Verbal Education On Medication, Other, Insurance Plans  [patient assistance application process. Updates to Medicare Part D for 2025. Provided patient with my contact number  and [name] and asked her to reach out if she doesn't receive a call.]  Pharmacy Interventions   Pharmacy Dicussed/Reviewed Pharmacy Topics Discussed, Pharmacy Topics Reviewed, Affording Medications  [Patient should expect a call from pharmacy technician to assist in completing application for assistance]       Follow up plan:  Patient will receive a call from Linda English Pharmacy Team     Encounter Outcome:  Patient Visit Completed   Linda Loll, RN, BSN [place]  [facility] RN Care Manager Direct Dial: [phone]

## 2023-09-26 ENCOUNTER — Encounter: Payer: Self-pay | Admitting: Neurology

## 2023-09-26 ENCOUNTER — Telehealth: Payer: Self-pay | Admitting: Pharmacy Technician

## 2023-09-26 DIAGNOSIS — Z5986 Financial insecurity: Secondary | ICD-10-CM

## 2023-09-26 NOTE — Progress Notes (Addendum)
Pharmacy Medication Assistance Program Note    09/26/2023  Patient ID: Linda English, female   DOB: [birth year], 68 y.o.   MRN: [id number]     09/26/2023  Outreach Medication One  Initial Outreach Date (Medication One) 09/22/2023  Manufacturer Medication One [employer]  [employer] Drugs Jardiance  Dose of Jardiance 10mg   Type of Radiographer, therapeutic Assistance  Date Application Sent to Patient 09/28/2023  Application Items Requested Application;Proof of Income;Other  Date Application Sent to Prescriber 09/28/2023  Name of Prescriber Carylon Perches   [facility] 10/12/2023 Successful outreach to patient in regard to Jardiance application for BI. HIPAA verified. Patient informs she received the application and will place in mail sometime this week.  [name], CPhT [name]  Office: [phone] Fax: [phone] Email: [name]

## 2023-10-02 NOTE — Progress Notes (Unsigned)
GI Office Note    Referring Provider: Carylon Perches, MD Primary Care Physician:  Carylon Perches, MD  Primary Gastroenterologist: Roetta Sessions, MD   Chief Complaint   No chief complaint on file.   History of Present Illness   Linda English is a 68 y.o. female presenting today for follow up. Last seen 02/2023. H/o GERD, IBS-C, chronic intermittent elevated alkaline phosphatase.    Complicated GI history to include history of rectal cancer status post low anterior resection in 2003, alternating constipation and diarrhea status post evaluation at [facility] with pelvic floor dysfunction and underwent anorectal manometry that showed type IV dyssynergy, GERD, dysphagia, nausea and vomiting with multiple imaging studies including CT head/MRI brain/CT abdomen pelvis and evaluated at [facility] with recommendations to continue Marinol. GES normal. Colonoscopy due 2026 for adenomatous colon polyps. EGD in 2021 with normal-appearing esophagus status post empiric dilation with large bore [facility] dilator, provided good relief. Right upper quadrant ultrasound 09/18/2020 with unremarkable liver, CBD 3 mm.    Chronically elevated alkaline phosphatase.  GGT has been normal.  AMA however slightly elevated but not enough to call positive.  Liver ultrasound in 2022 unremarkable.  Previous hepatitis B surface antigen negative, hepatitis B surface antibody negative, hepatitis B core antibody negative, hepatitis C antibody negative, hepatitis A total antibody negative, iron and ferritin unremarkable. PBC profile 02/2023 with positive ANA, neg Anti mitochondrial ab, neg anti M2, neg Anti GP 210, neg Anti SP 100, pos ASMA. Referred to rheumatology for evaluation of positive ANA. Further checked IgG and IgM both normal. Alk phos isoenzymes returned with a normal alk phos for the first time in awhile. Her isoenzymes with 65% liver, 35% bone.     Medications   Current Outpatient Medications  Medication Sig  Dispense Refill   acetaminophen (TYLENOL) 500 MG tablet Take 1,000 mg by mouth every 8 (eight) hours as needed for moderate pain or headache.     Alcohol Swabs (ALCOHOL PREP) 70 % PADS SMARTSIG:Pledget(s) Topical Daily     clobetasol cream (TEMOVATE) 0.05 % Apply topically.     famotidine (PEPCID) 40 MG tablet TAKE ONE TABLET BY MOUTH DAILY AT 9PM BEDTIME (Patient not taking: Reported on 09/14/2023) 90 tablet 3   furosemide (LASIX) 40 MG tablet Take 1 tablet (40 mg total) by mouth daily. (Patient taking differently: Take 20 mg by mouth daily.) 30 tablet 1   JARDIANCE 10 MG TABS tablet Take 10 mg by mouth daily.     lamoTRIgine (LAMICTAL) 200 MG tablet Take 200 mg by mouth 2 (two) times daily.     LORazepam (ATIVAN) 1 MG tablet Take 1 mg by mouth 3 (three) times daily as needed for anxiety.     metoprolol succinate (TOPROL-XL) 50 MG 24 hr tablet Take 1 tablet (50 mg total) by mouth daily. Take with or immediately following a meal. 30 tablet 1   nystatin cream (MYCOSTATIN) Apply 1 Application topically 2 (two) times daily.     olmesartan (BENICAR) 40 MG tablet SMARTSIG:1.0 Tablet(s) By Mouth Daily     pantoprazole (PROTONIX) 40 MG tablet TAKE ONE TABLET BY MOUTH DAILY AT 9AM 30 MIN BEFORE BREAKFAST 90 tablet 3   pravastatin (PRAVACHOL) 20 MG tablet Take 20 mg by mouth at bedtime.     triamcinolone cream (KENALOG) 0.1 % APPLY A THIN LAYER TO THE AFFECTED AREA(S) BY TOPICAL ROUTE 2 TIMES PER DAY     No current facility-administered medications for this visit.    Allergies  Allergies as of 10/03/2023 - Review Complete 09/22/2023  Allergen Reaction Noted   Lithium Nausea And Vomiting 06/27/2015   Diltiazem Other (See Comments) 08/16/2022   Excedrin extra strength [aspirin-acetaminophen-caffeine] Other (See Comments) 10/28/2014   Geodon [ziprasidone hcl]  05/01/2020   Klonopin [clonazepam] Other (See Comments) 10/23/2019     Past Medical History   Past Medical History:  Diagnosis Date    Acid reflux    Allergic rhinitis    Anxiety    Arthritis    osteoarthritis   Bipolar disorder (HCC)    Chronic headache    Depression    Diverticula of colon    DM (diabetes mellitus) (HCC)    Gastric erosions    Gastric polyps    benign    Hemorrhoids    Hiatal hernia    Hypertension    IBS (irritable bowel syndrome)    Lichen planus    Obstructive sleep apnea    Panic disorder    Parkinson's disease (HCC)    Rectal cancer (HCC) 2003   ileostomy and reversal   Subdural hematoma (HCC)    Tubular adenoma     Past Surgical History   Past Surgical History:  Procedure Laterality Date   ABDOMINAL HYSTERECTOMY     APPENDECTOMY     BACK SURGERY     BIOPSY  10/01/2019   Procedure: BIOPSY;  Surgeon: Corbin Ade, MD;  [facility]: [facility];  Service: Endoscopy;;  gastric   CESAREAN SECTION     X  2   CHOLECYSTECTOMY     COLONOSCOPY  08/2007   DR Jena Gauss, friable anal canal hemorrhoids, surgical anastomosis at 3cm, distal scattered tics   COLONOSCOPY  12/15/2010   anal papilla and internal hemorrhoids/diminutive polyp in the base of the cecum. Past, tubular adenoma.. Next colonoscopy due in April 2017.   COLONOSCOPY  10/27/2005   RMR: Anal canal hemorrhoids. Surgical anastomosis at 3 cm from the anal verge appeared normal. Few scattered distal diverticula. The residual  colonic mucosa appeared normal. I suspect the patient bled from hemorrhoids   COLONOSCOPY N/A 01/15/2015   [id number] residual rectum and colon. next tcs 12/2019   COLONOSCOPY WITH PROPOFOL N/A 05/01/2020   Procedure: COLONOSCOPY WITH PROPOFOL;  Surgeon: Corbin Ade, MD;  Two 3-8 millimeters polyps in ascending colon, diverticulosis in descending colon, surgical anastomosis at 4 cm, otherwise normal exam.  Pathology with tubular adenomas.  Repeat colonoscopy in 5 years.   ESOPHAGEAL DILATION N/A 06/27/2015   Procedure: ESOPHAGEAL DILATION;  Surgeon: Corbin Ade, MD;  Location: [facility];  Service:  Endoscopy;  Laterality: N/A;   ESOPHAGOGASTRODUODENOSCOPY  05/2002   DR Jena Gauss, normal   ESOPHAGOGASTRODUODENOSCOPY  08/03/2011   RMR: small HH/ gastric polyps   ESOPHAGOGASTRODUODENOSCOPY N/A 06/27/2015   Dr. Jena Gauss: Mild erosive reflux esophagitits. Status post passage of a Maloney dilator. Hiatal hernia. Gastric polyps and abnormal gastric mucosa of doubtful clinical significane. status post biopsy, benign fundic gland polyp, negative H.pylori   ESOPHAGOGASTRODUODENOSCOPY (EGD) WITH PROPOFOL N/A 10/01/2019   rourk: Normal esophagus status post esophageal dilation for dysphagia, erythematous mucosa in the stomach biopsy consistent with reactive gastropathy, no H. pylori, multiple gastric polyps (biopsy fundic gland)   low anterior rection  2003   RECTAL CANCER   MALONEY DILATION N/A 10/01/2019   Procedure: MALONEY DILATION;  Surgeon: Corbin Ade, MD;  [facility]: [facility];  Service: Endoscopy;  Laterality: N/A;   POLYPECTOMY  05/01/2020   Procedure: POLYPECTOMY;  Surgeon:  Rourk, Gerrit Friends, MD;  Location: [facility];  Service: Endoscopy;;   SPINE SURGERY  2001   L5,S1 HEMILAMINOTOMY AND DISCECTOMY/DR DEATON    Past Family History   Family History  Problem Relation Age of Onset   Diabetes Mother    Hypertension Mother    Hypertension Father    Heart attack Father    Heart failure Father    Colon polyps Brother    Colon cancer Paternal Grandfather 46   Colon polyps Cousin     Past Social History   Social History   Socioeconomic History   Marital status: Divorced    Spouse name: Not on file   Number of children: 2   Years of education: 12   Highest education level: 12th grade  Occupational History   Occupation: Magazine features editor: RETIRED    Comment: [employer] [address]  Tobacco Use   Smoking status: Never    Passive exposure: Never   Smokeless tobacco: Never   Tobacco comments:    Never smoked  Vaping Use   Vaping status: Never Used  Substance and Sexual Activity    Alcohol use: No   Drug use: No   Sexual activity: Not Currently    Partners: Male  Other Topics Concern   Not on file  Social History Narrative   Not on file   Social Drivers of Health   Financial Resource Strain: Medium Risk (09/22/2023)   Overall Financial Resource Strain (CARDIA)    Difficulty of Paying Living Expenses: Somewhat hard  Food Insecurity: No Food Insecurity (04/08/2022)   Hunger Vital Sign    Worried About Running Out of Food in the Last Year: Never true    Ran Out of Food in the Last Year: Never true  Transportation Needs: Unmet Transportation Needs (04/08/2022)   PRAPARE - Administrator, Civil Service (Medical): Yes    Lack of Transportation (Non-Medical): No  Physical Activity: Inactive (04/08/2022)   Exercise Vital Sign    Days of Exercise per Week: 0 days    Minutes of Exercise per Session: 0 min  Stress: No Stress Concern Present (04/08/2022)   [employer] of Occupational Health - Occupational Stress Questionnaire    Feeling of Stress : Only a little  Social Connections: Moderately Isolated (04/08/2022)   Social Connection and Isolation Panel [NHANES]    Frequency of Communication with Friends and Family: More than three times a week    Frequency of Social Gatherings with Friends and Family: More than three times a week    Attends Religious Services: More than 4 times per year    Active Member of [employer] or Organizations: No    Attends Banker Meetings: Never    Marital Status: Divorced  Catering manager Violence: Not At Risk (04/08/2022)   Humiliation, Afraid, Rape, and Kick questionnaire    Fear of Current or Ex-Partner: No    Emotionally Abused: No    Physically Abused: No    Sexually Abused: No    Review of Systems   General: Negative for anorexia, weight loss, fever, chills, fatigue, weakness. ENT: Negative for hoarseness, difficulty swallowing , nasal congestion. CV: Negative for chest pain, angina, palpitations,  dyspnea on exertion, peripheral edema.  Respiratory: Negative for dyspnea at rest, dyspnea on exertion, cough, sputum, wheezing.  GI: See history of present illness. GU:  Negative for dysuria, hematuria, urinary incontinence, urinary frequency, nocturnal urination.  Endo: Negative for unusual weight change.  Physical Exam   There were no vitals taken for this visit.   General: Well-nourished, well-developed in no acute distress.  Eyes: No icterus. Mouth: Oropharyngeal mucosa moist and pink , no lesions erythema or exudate. Lungs: Clear to auscultation bilaterally.  Heart: Regular rate and rhythm, no murmurs rubs or gallops.  Abdomen: Bowel sounds are normal, nontender, nondistended, no hepatosplenomegaly or masses,  no abdominal bruits or hernia , no rebound or guarding.  Rectal: ***  Extremities: No lower extremity edema. No clubbing or deformities. Neuro: Alert and oriented x 4   Skin: Warm and dry, no jaundice.   Psych: Alert and cooperative, normal mood and affect.  Labs   *** Imaging Studies   MR BRAIN W WO CONTRAST Result Date: 09/22/2023 CLINICAL DATA:  Altered mental status. Gait abnormality. Bipolar 1 disorder. Essential hypertension. Paresthesia. Loss of consciousness. EXAM: MRI HEAD WITHOUT AND WITH CONTRAST TECHNIQUE: Multiplanar, multiecho pulse sequences of the brain and surrounding structures were obtained without and with intravenous contrast. CONTRAST:  6mL GADAVIST GADOBUTROL 1 MMOL/ML IV SOLN COMPARISON:  Head CT 08/07/2023 and MRI 01/18/2020 FINDINGS: Brain: There is no evidence of an acute infarct, intracranial hemorrhage, mass, midline shift, or extra-axial fluid collection. Mild cerebral atrophy is unchanged and most notable in the left greater than right parietal regions. No significant white matter disease is seen for age. No abnormal enhancement is identified. Vascular: Major intracranial vascular flow voids are preserved. Skull and upper cervical spine:  Unremarkable bone marrow signal. Sinuses/Orbits: Unremarkable orbits. Paranasal sinuses and mastoid air cells are clear. Other: None. IMPRESSION: 1. No acute intracranial abnormality. 2. Mild cerebral atrophy. Electronically Signed   By: Sebastian Ache M.D.   On: 09/22/2023 17:20   MR CERVICAL SPINE WO CONTRAST Result Date: 09/22/2023 CLINICAL DATA:  Ataxia, nontraumatic, cervical pathology suspected. Gait abnormality. Paresthesia. EXAM: MRI CERVICAL SPINE WITHOUT CONTRAST TECHNIQUE: Multiplanar, multisequence MR imaging of the cervical spine was performed. No intravenous contrast was administered. COMPARISON:  Cervical spine CT 02/20/2021 and MRI 11/09/2018 FINDINGS: Alignment: Chronic straightening of the normal cervical lordosis. Unchanged trace anterolisthesis of C4 on C5. Vertebrae: No fracture, suspicious marrow lesion, or significant marrow edema. Multilevel chronic degenerative endplate changes. Previous C6-7 ACDF with solid arthrodesis. Cord: Normal signal. Posterior Fossa, vertebral arteries, paraspinal tissues: Unremarkable. Disc levels: Moderate disc space narrowing throughout the cervical spine. C2-3: Disc bulging, uncovertebral spurring, and mild facet arthrosis without significant stenosis. C3-4: Disc bulging, a central disc protrusion, uncovertebral spurring, and moderate facet arthrosis result in mild spinal stenosis and moderate bilateral neural foraminal stenosis. The disc protrusion has decreased in size from the prior MRI. Foraminal stenosis has mildly progressed. C4-5: Disc bulging, uncovertebral spurring, and moderate facet arthrosis result in mild-to-moderate bilateral neural foraminal stenosis without significant spinal stenosis, unchanged. C5-6: Disc bulging, uncovertebral spurring, and mild facet arthrosis result in mild spinal stenosis and mild right and moderate to severe left neural foraminal stenosis with potential left C6 nerve root impingement, unchanged. C6-7: ACDF.  No stenosis.  C7-T1: Mild disc bulging, right uncovertebral spurring, and mild-to-moderate facet arthrosis result in mild right greater than left neural foraminal stenosis, new from the prior MRI. No spinal stenosis. T1-2: Disc bulging, endplate spurring, and mild facet arthrosis result in mild right and moderate left neural foraminal stenosis, stable to mildly progressed. No spinal stenosis. IMPRESSION: 1. Mild spinal stenosis and moderate bilateral neural foraminal stenosis at C3-4, mildly progressed. 2. Unchanged mild spinal stenosis and moderate to severe left neural foraminal stenosis at C5-6. 3.  Progressive, moderate bilateral neural foraminal stenosis at C3-4 and new mild bilateral foraminal stenosis at C7-T1. 4. C6-7 ACDF without stenosis. Electronically Signed   By: Sebastian Ache M.D.   On: 09/22/2023 17:17   XR Shoulder Right Result Date: 09/14/2023 X-ray right shoulder 4 views Normal humerus position and internal and external rotation.  Glenohumeral joint space is preserved but marginal osteophytes on the humerus.  AC joint space is preserved.  Enthesophyte processes on lateral superior border of acromion.  No visible joint effusion or abnormal calcifications seen.  No acute appearing bony abnormality. Impression Mild to moderate degenerative changes throughout including glenohumeral joint, no current visible effusion or erosive disease changes  XR Hand 2 View Right Result Date: 09/14/2023 X-ray right hand 2 views Radiocarpal joint space appears normal.  Ulnar styloid absent.  Few cystic changes present in the carpal bones most noticeable at the trapezium.  Moderate first CMC joint degenerative changes without subluxation.  Numerous subchondral cyst and marginal bone spurring at first and second MCP joints.  Also extensive osteophytes throughout 1st through 3rd PIPs and in second third DIPs.  No erosions or abnormal calcifications seen.  Bone mineralization appears normal. Impression Generalized osteoarthritis  most advanced in the first 3 digits, prominent disease at MCPs can be seen secondary to chronic metabolic or inflammatory degenerative process but nonspecific and favored generalized OA  XR Hand 2 View Left Result Date: 09/14/2023 X-ray left hand 2 views Radiocarpal joint space appears normal.  Cystic changes present throughout carpal bones largest in lunate.  Moderate degenerative change at first [facility] joint without significant subluxation.  Significant appearing osteophytes present at 1st through 3rd MCP joints very slight joint narrowing.  Also marginal osteophytes and asymmetric joint space narrowing at first IP and second and third DIPs.  No erosions or abnormal calcifications seen.  Bone mineralization appears normal. Impression Generalized osteoarthritis but most advanced in first 3 digits   Assessment       PLAN   ***   Leanna Battles. Melvyn Neth, MHS, PA-C [facility]

## 2023-10-03 ENCOUNTER — Ambulatory Visit: Payer: Medicare PPO | Admitting: Gastroenterology

## 2023-10-03 ENCOUNTER — Ambulatory Visit (INDEPENDENT_AMBULATORY_CARE_PROVIDER_SITE_OTHER): Payer: Medicare PPO | Admitting: Gastroenterology

## 2023-10-03 ENCOUNTER — Encounter: Payer: Self-pay | Admitting: Gastroenterology

## 2023-10-03 ENCOUNTER — Other Ambulatory Visit: Payer: Self-pay

## 2023-10-03 VITALS — BP 158/83 | HR 60 | Temp 98.0°F | Ht 61.0 in | Wt 135.4 lb

## 2023-10-03 DIAGNOSIS — K581 Irritable bowel syndrome with constipation: Secondary | ICD-10-CM | POA: Diagnosis not present

## 2023-10-03 DIAGNOSIS — I10 Essential (primary) hypertension: Secondary | ICD-10-CM

## 2023-10-03 DIAGNOSIS — R768 Other specified abnormal immunological findings in serum: Secondary | ICD-10-CM

## 2023-10-03 DIAGNOSIS — K219 Gastro-esophageal reflux disease without esophagitis: Secondary | ICD-10-CM

## 2023-10-03 DIAGNOSIS — R748 Abnormal levels of other serum enzymes: Secondary | ICD-10-CM

## 2023-10-03 DIAGNOSIS — R42 Dizziness and giddiness: Secondary | ICD-10-CM

## 2023-10-03 DIAGNOSIS — R110 Nausea: Secondary | ICD-10-CM

## 2023-10-03 DIAGNOSIS — R7989 Other specified abnormal findings of blood chemistry: Secondary | ICD-10-CM

## 2023-10-03 NOTE — Patient Instructions (Signed)
Please follow up with Dr. Ouida Sills for elevated blood pressure and for follow up on your kidneys. You may need referral to a kidney specialist (nephrologist).  Continue pantoprazole 40mg  before breakfast. You can use OTC antacids like TUMS for occasional breakthrough heartburn. We will plan to update your liver labs in June 2025 followed by seeing you in the office.  Call if you have any questions or concerns.

## 2023-10-05 ENCOUNTER — Ambulatory Visit (HOSPITAL_COMMUNITY)
Admission: RE | Admit: 2023-10-05 | Discharge: 2023-10-05 | Disposition: A | Discharge status: Home / Self Care | Payer: Medicare PPO | Source: Ambulatory Visit | Attending: Internal Medicine | Admitting: Internal Medicine

## 2023-10-05 DIAGNOSIS — Z780 Asymptomatic menopausal state: Secondary | ICD-10-CM | POA: Insufficient documentation

## 2023-10-05 DIAGNOSIS — Z13820 Encounter for screening for osteoporosis: Secondary | ICD-10-CM | POA: Diagnosis not present

## 2023-10-05 DIAGNOSIS — M85851 Other specified disorders of bone density and structure, right thigh: Secondary | ICD-10-CM | POA: Diagnosis not present

## 2023-10-05 NOTE — Progress Notes (Signed)
 Bone density shows osteopenia and does not indicate a high risk of fractures.  I do not see anything abnormal that would be indicative of inflammation or associated with arthritis in other areas.

## 2023-10-06 DIAGNOSIS — E782 Mixed hyperlipidemia: Secondary | ICD-10-CM | POA: Diagnosis not present

## 2023-10-06 DIAGNOSIS — E119 Type 2 diabetes mellitus without complications: Secondary | ICD-10-CM | POA: Diagnosis not present

## 2023-10-07 ENCOUNTER — Ambulatory Visit: Payer: Medicare PPO | Admitting: Gastroenterology

## 2023-10-07 DIAGNOSIS — N1831 Chronic kidney disease, stage 3a: Secondary | ICD-10-CM | POA: Diagnosis not present

## 2023-10-07 DIAGNOSIS — I10 Essential (primary) hypertension: Secondary | ICD-10-CM | POA: Diagnosis not present

## 2023-10-07 LAB — LIPID PANEL
Chol/HDL Ratio: 2.3 {ratio} (ref 0.0–4.4)
Cholesterol, Total: 156 mg/dL (ref 100–199)
HDL: 68 mg/dL (ref >39)
LDL Chol Calc (NIH): 74 mg/dL (ref 0–99)
Triglycerides: 75 mg/dL (ref 0–149)
VLDL Cholesterol Cal: 14 mg/dL (ref 5–40)

## 2023-10-07 LAB — COMPREHENSIVE METABOLIC PANEL
ALT: 12 [IU]/L (ref 0–32)
AST: 19 [IU]/L (ref 0–40)
Albumin: 4.4 g/dL (ref 3.9–4.9)
Alkaline Phosphatase: 118 [IU]/L (ref 44–121)
BUN/Creatinine Ratio: 16 (ref 12–28)
BUN: 19 mg/dL (ref 8–27)
Bilirubin Total: <0.2 mg/dL (ref 0.0–1.2)
CO2: 23 mmol/L (ref 20–29)
Calcium: 9.3 mg/dL (ref 8.7–10.3)
Chloride: 103 mmol/L (ref 96–106)
Creatinine, Ser: 1.19 mg/dL — ABNORMAL HIGH (ref 0.57–1.00)
Globulin, Total: 2.7 g/dL (ref 1.5–4.5)
Glucose: 100 mg/dL — ABNORMAL HIGH (ref 70–99)
Potassium: 4 mmol/L (ref 3.5–5.2)
Sodium: 141 mmol/L (ref 134–144)
Total Protein: 7.1 g/dL (ref 6.0–8.5)
eGFR: 50 mL/min/{1.73_m2} — ABNORMAL LOW (ref >59)

## 2023-10-07 LAB — T4, FREE: Free T4: 1.21 ng/dL (ref 0.82–1.77)

## 2023-10-07 LAB — TSH: TSH: 2.16 u[IU]/mL (ref 0.450–4.500)

## 2023-10-10 ENCOUNTER — Ambulatory Visit: Payer: Medicare Other | Admitting: Gastroenterology

## 2023-10-11 ENCOUNTER — Ambulatory Visit (INDEPENDENT_AMBULATORY_CARE_PROVIDER_SITE_OTHER): Payer: Medicare HMO | Admitting: "Endocrinology

## 2023-10-11 ENCOUNTER — Encounter: Payer: Self-pay | Admitting: "Endocrinology

## 2023-10-11 VITALS — BP 138/86 | HR 68 | Ht 61.0 in | Wt 137.6 lb

## 2023-10-11 DIAGNOSIS — E782 Mixed hyperlipidemia: Secondary | ICD-10-CM

## 2023-10-11 DIAGNOSIS — E1122 Type 2 diabetes mellitus with diabetic chronic kidney disease: Secondary | ICD-10-CM | POA: Diagnosis not present

## 2023-10-11 DIAGNOSIS — N183 Chronic kidney disease, stage 3 unspecified: Secondary | ICD-10-CM

## 2023-10-11 DIAGNOSIS — Z7984 Long term (current) use of oral hypoglycemic drugs: Secondary | ICD-10-CM

## 2023-10-11 DIAGNOSIS — I10 Essential (primary) hypertension: Secondary | ICD-10-CM | POA: Diagnosis not present

## 2023-10-11 LAB — POCT GLYCOSYLATED HEMOGLOBIN (HGB A1C): HbA1c, POC (controlled diabetic range): 6.2 % (ref 0.0–7.0)

## 2023-10-11 NOTE — Patient Instructions (Signed)

## 2023-10-11 NOTE — Progress Notes (Signed)
10/11/2023, 12:30 PM   Endocrinology follow-up note  Subjective:    Patient ID: Linda English, female    DOB: Oct 17, 1955, PCP Carylon Perches, MD   Past Medical History:  Diagnosis Date   Acid reflux    Allergic rhinitis    Anxiety    Arthritis    osteoarthritis   Bipolar disorder (HCC)    Chronic headache    Depression    Diverticula of colon    DM (diabetes mellitus) (HCC)    Gastric erosions    Gastric polyps    benign    Hemorrhoids    Hiatal hernia    Hypertension    IBS (irritable bowel syndrome)    Lichen planus    Obstructive sleep apnea    Panic disorder    Parkinson's disease (HCC)    Rectal cancer (HCC) 2003   ileostomy and reversal   Subdural hematoma (HCC)    Tubular adenoma    Past Surgical History:  Procedure Laterality Date   ABDOMINAL HYSTERECTOMY     APPENDECTOMY     BACK SURGERY     BIOPSY  10/01/2019   Procedure: BIOPSY;  Surgeon: Corbin Ade, MD;  [facility];  Service: Endoscopy;;  gastric   CESAREAN SECTION     X  2   CHOLECYSTECTOMY     COLONOSCOPY  08/2007   DR Jena Gauss, friable anal canal hemorrhoids, surgical anastomosis at 3cm, distal scattered tics   COLONOSCOPY  12/15/2010   anal papilla and internal hemorrhoids/diminutive polyp in the base of the cecum. Past, tubular adenoma.. Next colonoscopy due in April 2017.   COLONOSCOPY  10/27/2005   RMR: Anal canal hemorrhoids. Surgical anastomosis at 3 cm from the anal verge appeared normal. Few scattered distal diverticula. The residual  colonic mucosa appeared normal. I suspect the patient bled from hemorrhoids   COLONOSCOPY N/A 01/15/2015   IPJ:ASNKNL residual rectum and colon. next tcs 12/2019   COLONOSCOPY WITH PROPOFOL N/A 05/01/2020   Procedure: COLONOSCOPY WITH PROPOFOL;  Surgeon: Corbin Ade, MD;  Two 3-8 millimeters polyps in ascending colon, diverticulosis in descending colon, surgical anastomosis at 4 cm,  otherwise normal exam.  Pathology with tubular adenomas.  Repeat colonoscopy in 5 years.   ESOPHAGEAL DILATION N/A 06/27/2015   Procedure: ESOPHAGEAL DILATION;  Surgeon: Corbin Ade, MD;  [facility];  Service: Endoscopy;  Laterality: N/A;   ESOPHAGOGASTRODUODENOSCOPY  05/2002   DR Jena Gauss, normal   ESOPHAGOGASTRODUODENOSCOPY  08/03/2011   RMR: small HH/ gastric polyps   ESOPHAGOGASTRODUODENOSCOPY N/A 06/27/2015   Dr. Jena Gauss: Mild erosive reflux esophagitits. Status post passage of a Maloney dilator. Hiatal hernia. Gastric polyps and abnormal gastric mucosa of doubtful clinical significane. status post biopsy, benign fundic gland polyp, negative H.pylori   ESOPHAGOGASTRODUODENOSCOPY (EGD) WITH PROPOFOL N/A 10/01/2019   rourk: Normal esophagus status post esophageal dilation for dysphagia, erythematous mucosa in the stomach biopsy consistent with reactive gastropathy, no H. pylori, multiple gastric polyps (biopsy fundic gland)   low anterior rection  2003   RECTAL CANCER   MALONEY DILATION N/A 10/01/2019   Procedure: MALONEY DILATION;  Surgeon: Corbin Ade, MD;  [facility];  Service: Endoscopy;  Laterality:  N/A;   POLYPECTOMY  05/01/2020   Procedure: POLYPECTOMY;  Surgeon: Corbin Ade, MD;  [facility];  Service: Endoscopy;;   SPINE SURGERY  2001   L5,S1 HEMILAMINOTOMY AND DISCECTOMY/DR DEATON   Social History   Socioeconomic History   Marital status: Divorced    Spouse name: Not on file   Number of children: 2   Years of education: 12   Highest education level: 12th grade  Occupational History   Occupation: Magazine features editor: RETIRED    Comment: [employer]  Tobacco Use   Smoking status: Never    Passive exposure: Never   Smokeless tobacco: Never   Tobacco comments:    Never smoked  Vaping Use   Vaping status: Never Used  Substance and Sexual Activity   Alcohol use: No   Drug use: No   Sexual activity: Not Currently    Partners: Male   Other Topics Concern   Not on file  Social History Narrative   Not on file   Social Drivers of Health   Financial Resource Strain: Medium Risk (09/22/2023)   Overall Financial Resource Strain (CARDIA)    Difficulty of Paying Living Expenses: Somewhat hard  Food Insecurity: No Food Insecurity (04/08/2022)   Hunger Vital Sign    Worried About Running Out of Food in the Last Year: Never true    Ran Out of Food in the Last Year: Never true  Transportation Needs: Unmet Transportation Needs (04/08/2022)   PRAPARE - Administrator, Civil Service (Medical): Yes    Lack of Transportation (Non-Medical): No  Physical Activity: Inactive (04/08/2022)   Exercise Vital Sign    Days of Exercise per Week: 0 days    Minutes of Exercise per Session: 0 min  Stress: No Stress Concern Present (04/08/2022)   [employer] of Occupational Health - Occupational Stress Questionnaire    Feeling of Stress : Only a little  Social Connections: Moderately Isolated (04/08/2022)   Social Connection and Isolation Panel [NHANES]    Frequency of Communication with Friends and Family: More than three times a week    Frequency of Social Gatherings with Friends and Family: More than three times a week    Attends Religious Services: More than 4 times per year    Active Member of [employer] or Organizations: No    Attends Banker Meetings: Never    Marital Status: Divorced   Family History  Problem Relation Age of Onset   Diabetes Mother    Hypertension Mother    Hypertension Father    Heart attack Father    Heart failure Father    Colon polyps Brother    Colon cancer Paternal Grandfather 62   Colon polyps Cousin    Outpatient Encounter Medications as of 10/11/2023  Medication Sig   acetaminophen (TYLENOL) 500 MG tablet Take 1,000 mg by mouth every 8 (eight) hours as needed for moderate pain or headache.   Alcohol Swabs (ALCOHOL PREP) 70 % PADS SMARTSIG:Pledget(s) Topical Daily   clobetasol  cream (TEMOVATE) 0.05 % Apply topically.   furosemide (LASIX) 20 MG tablet Take 20 mg by mouth daily.   JARDIANCE 10 MG TABS tablet Take 10 mg by mouth daily.   lamoTRIgine (LAMICTAL) 200 MG tablet Take 200 mg by mouth 2 (two) times daily.   LORazepam (ATIVAN) 1 MG tablet Take 1 mg by mouth 3 (three) times daily as needed for anxiety.   metoprolol succinate (TOPROL-XL) 50 MG 24  hr tablet Take 1 tablet (50 mg total) by mouth daily. Take with or immediately following a meal.   nystatin cream (MYCOSTATIN) Apply 1 Application topically 2 (two) times daily.   olmesartan (BENICAR) 40 MG tablet SMARTSIG:1.0 Tablet(s) By Mouth Daily   pantoprazole (PROTONIX) 40 MG tablet TAKE ONE TABLET BY MOUTH DAILY AT 9AM 30 MIN BEFORE BREAKFAST   pravastatin (PRAVACHOL) 20 MG tablet Take 20 mg by mouth at bedtime.   triamcinolone cream (KENALOG) 0.1 % APPLY A THIN LAYER TO THE AFFECTED AREA(S) BY TOPICAL ROUTE 2 TIMES PER DAY   No facility-administered encounter medications on file as of 10/11/2023.   ALLERGIES: Allergies  Allergen Reactions   Lithium Nausea And Vomiting   Diltiazem Other (See Comments)    Low heart rate, extreme fatigue, pain   Excedrin Extra Strength [Aspirin-Acetaminophen-Caffeine] Other (See Comments)    Makes her nervous.   Geodon [Ziprasidone Hcl]     Neurologist discontinued d/t diagnosis   Klonopin [Clonazepam] Other (See Comments)    hallucinations    VACCINATION STATUS: Immunization History  Administered Date(s) Administered   Influenza,inj,Quad PF,6+ Mos 06/25/2019   Pneumococcal Polysaccharide-23 08/13/2019    HPI Linda English is 68 y.o. female who presents today with a medical history as above. she is being seen in follow-up after she was seen in consultation for controlled type 2 diabetes.  This patient was kept on low-dose SGLT2 inhibitors to treat type 2 diabetes complicated by mild CKD.   She is currently on Jardiance 10 mg p.o. daily at breakfast.  She  does not monitor blood glucose regularly, her point-of-care A1c was found to be 6.2% today.  She denies any hypoglycemia.     Previously seen in this practice for mild hypercortisolemia which did not show any evidence of Cushing syndrome.    She has stage 2-3 CKD which has been stable and improving over several years.  Uncontrolled blood pressure currently on multiple medications including losartan 50 mg p.o. daily, Lasix 20 mg p.o. daily, metoprolol 50 mg p.o. daily.  Recently amlodipine was added to this regimen.   Review of Systems  Constitutional: no recent weight gain/loss, no fatigue, no subjective hyperthermia, no subjective hypothermia Eyes: no blurry vision, no xerophthalmia   Objective:       10/11/2023    8:55 AM 10/03/2023   10:52 AM 10/03/2023   10:51 AM  Vitals with BMI  Height 5\' 1"   5\' 1"   Weight 137 lbs 10 oz  135 lbs 6 oz  BMI 26.01  25.6  Systolic 138 158 604  Diastolic 86 83 100  Pulse 68 60 67    BP 138/86   Pulse 68   Ht 5\' 1"  (1.549 m)   Wt 137 lb 9.6 oz (62.4 kg)   BMI 26.00 kg/m   Wt Readings from Last 3 Encounters:  10/11/23 137 lb 9.6 oz (62.4 kg)  10/03/23 135 lb 6.4 oz (61.4 kg)  09/14/23 136 lb (61.7 kg)    Physical Exam  Constitutional:  Body mass index is 26 kg/m.,  not in acute distress, normal state of mind Eyes: PERRLA, EOMI, no exophthalmos ENT: moist mucous membranes, no gross thyromegaly, no gross cervical lymphadenopathy   CMP ( most recent) CMP     Component Value Date/Time   NA 141 10/06/2023 0857   K 4.0 10/06/2023 0857   K 4.6 06/26/2010 1400   CL 103 10/06/2023 0857   CO2 23 10/06/2023 0857   GLUCOSE 100 (H) 10/06/2023  0857   GLUCOSE 111 (H) 08/07/2023 0205   BUN 19 10/06/2023 0857   CREATININE 1.19 (H) 10/06/2023 0857   CREATININE 1.21 (H) 08/11/2020 1400   CALCIUM 9.3 10/06/2023 0857   PROT 7.1 10/06/2023 0857   ALBUMIN 4.4 10/06/2023 0857   AST 19 10/06/2023 0857   AST 17 06/26/2010 1400   ALT 12 10/06/2023  0857   ALKPHOS 118 10/06/2023 0857   ALKPHOS 92 06/26/2010 1400   BILITOT <0.2 10/06/2023 0857   BILITOT 0.3 06/26/2010 1400   GFRNONAA 48 (L) 08/07/2023 0205   GFRNONAA 48 (L) 08/11/2020 1400   GFRAA 55 (L) 08/11/2020 1400   GFRAA 55 (L) 08/11/2020 1315     Diabetic Labs (most recent): Lab Results  Component Value Date   HGBA1C 6.2 10/11/2023   HGBA1C 5.8 04/05/2023   HGBA1C 5.9 09/13/2022     Lipid Panel ( most recent) Lipid Panel     Component Value Date/Time   CHOL 156 10/06/2023 0857   TRIG 75 10/06/2023 0857   HDL 68 10/06/2023 0857   CHOLHDL 2.3 10/06/2023 0857   CHOLHDL 3.0 08/10/2015 0621   VLDL 38 08/10/2015 0621   LDLCALC 74 10/06/2023 0857   LABVLDL 14 10/06/2023 0857      Lab Results  Component Value Date   TSH 2.160 10/06/2023   TSH 2.000 08/17/2023   TSH 1.310 08/16/2022   TSH 2.230 01/18/2020   TSH 2.048 10/26/2019   TSH 2.213 10/03/2013   TSH 1.312 04/27/2012   FREET4 1.21 10/06/2023   FREET4 1.03 01/18/2020   FREET4 1.41 (H) 10/26/2019   FREET4 1.20 04/27/2012      Assessment & Plan:   1. Type 2 diabetes mellitus with CKD   2. Essential hypertension, benign 3. Mixed hyperlipidemia  - I have reviewed her available endocrine records and clinically evaluated the patient. - Based on these reviews, she has well-controlled type 2 diabetes complicated by CKD.  She will not need additional medications, advised to continue Jardiance 10 mg daily with breakfast.  Side effects and precautions discussed with her.  This medication will afford her additional benefits in terms of her renal health.  In light of her CKD, she is advised to control blood pressure by taking her medications consistently in the morning.  She has Toprol 50 mg p.o. daily, losartan 50 mg p.o. daily, and Lasix as needed.  Amlodipine 5 mg were recently added to control her blood pressure better. Her labs showed controlled LDL at 74.  She is advised to continue pravastatin 20 mg  p.o. nightly.      - she is advised to maintain close follow up with Carylon Perches, MD for primary care needs.   I spent  26  minutes in the care of the patient today including review of labs from Thyroid Function, CMP, and other relevant labs ; imaging/biopsy records (current and previous including abstractions from other facilities); face-to-face time discussing  her lab results and symptoms, medications doses, her options of short and long term treatment based on the latest standards of care / guidelines;   and documenting the encounter.  Jasmyn H Malmstrom  participated in the discussions, expressed understanding, and voiced agreement with the above plans.  All questions were answered to her satisfaction. she is encouraged to contact clinic should she have any questions or concerns prior to her return visit.  Follow up plan: Return in about 6 months (around 04/09/2024) for A1c -NV.   Marquis Lunch, MD [facility] [address] [place], [place] [address] Phone: [phone]  Fax: [phone]     10/11/2023, 12:30 PM  This note was partially dictated with voice recognition software. Similar sounding words can be transcribed inadequately or may not  be corrected upon review.

## 2023-10-13 ENCOUNTER — Ambulatory Visit: Payer: Medicare Other | Admitting: Neurology

## 2023-10-18 DIAGNOSIS — R413 Other amnesia: Secondary | ICD-10-CM | POA: Diagnosis not present

## 2023-10-18 DIAGNOSIS — R55 Syncope and collapse: Secondary | ICD-10-CM | POA: Diagnosis not present

## 2023-10-18 DIAGNOSIS — R569 Unspecified convulsions: Secondary | ICD-10-CM | POA: Diagnosis not present

## 2023-10-18 DIAGNOSIS — R259 Unspecified abnormal involuntary movements: Secondary | ICD-10-CM | POA: Diagnosis not present

## 2023-10-19 DIAGNOSIS — R413 Other amnesia: Secondary | ICD-10-CM | POA: Diagnosis not present

## 2023-10-19 DIAGNOSIS — R259 Unspecified abnormal involuntary movements: Secondary | ICD-10-CM | POA: Diagnosis not present

## 2023-10-19 DIAGNOSIS — R55 Syncope and collapse: Secondary | ICD-10-CM | POA: Diagnosis not present

## 2023-10-19 DIAGNOSIS — R569 Unspecified convulsions: Secondary | ICD-10-CM | POA: Diagnosis not present

## 2023-10-20 DIAGNOSIS — R569 Unspecified convulsions: Secondary | ICD-10-CM | POA: Diagnosis not present

## 2023-10-20 DIAGNOSIS — R55 Syncope and collapse: Secondary | ICD-10-CM | POA: Diagnosis not present

## 2023-10-20 DIAGNOSIS — R413 Other amnesia: Secondary | ICD-10-CM | POA: Diagnosis not present

## 2023-10-20 DIAGNOSIS — R259 Unspecified abnormal involuntary movements: Secondary | ICD-10-CM | POA: Diagnosis not present

## 2023-10-21 ENCOUNTER — Telehealth: Payer: Self-pay | Admitting: Pharmacy Technician

## 2023-10-21 DIAGNOSIS — R413 Other amnesia: Secondary | ICD-10-CM | POA: Diagnosis not present

## 2023-10-21 DIAGNOSIS — Z5986 Financial insecurity: Secondary | ICD-10-CM

## 2023-10-21 DIAGNOSIS — R259 Unspecified abnormal involuntary movements: Secondary | ICD-10-CM | POA: Diagnosis not present

## 2023-10-21 DIAGNOSIS — R269 Unspecified abnormalities of gait and mobility: Secondary | ICD-10-CM | POA: Diagnosis not present

## 2023-10-21 DIAGNOSIS — R569 Unspecified convulsions: Secondary | ICD-10-CM | POA: Diagnosis not present

## 2023-10-21 DIAGNOSIS — R55 Syncope and collapse: Secondary | ICD-10-CM

## 2023-10-21 NOTE — Progress Notes (Addendum)
 Pharmacy Medication Assistance Program Note    10/21/2023  Patient ID: Linda English, female   DOB: [birth year], 68 y.o.   MRN: [id number]     09/26/2023 10/21/2023  Outreach Medication One  Initial Outreach Date (Medication One) 09/22/2023   Manufacturer Medication One [employer]   [employer] Drugs Jardiance   Dose of Jardiance 10mg    Type of Radiographer, therapeutic Assistance   Date Application Sent to Patient 09/28/2023   Application Items Requested Application;Proof of Income;Other   Date Application Sent to Prescriber 09/28/2023   Name of Prescriber Carylon Perches   Date Application Received From Patient  10/20/2023  Application Items Received From Patient  Application;Other  Date Application Received From Provider  09/28/2023  Date Application Submitted to Manufacturer  10/21/2023  Method Application Sent to Manufacturer  Fax   Submitted app.  ADDENDUM 10/31/2023 Care coordination call placed to BI in regard to [facility] application. Spoke to [place] who informs they only received the provider's pages of the application and requested the other pages be faxed. Informed the whole application was faxed on 10/21/23. She informs they only received the 2 pages. Application was refaxed to BI this morning.  [name], CPhT [place]  Office: [phone] Fax: [phone] Email: [email or web address]

## 2023-10-27 DIAGNOSIS — E1129 Type 2 diabetes mellitus with other diabetic kidney complication: Secondary | ICD-10-CM | POA: Diagnosis not present

## 2023-10-27 DIAGNOSIS — N1832 Chronic kidney disease, stage 3b: Secondary | ICD-10-CM | POA: Diagnosis not present

## 2023-10-27 DIAGNOSIS — Z79899 Other long term (current) drug therapy: Secondary | ICD-10-CM | POA: Diagnosis not present

## 2023-10-27 DIAGNOSIS — I10 Essential (primary) hypertension: Secondary | ICD-10-CM | POA: Diagnosis not present

## 2023-10-28 ENCOUNTER — Encounter: Payer: Self-pay | Admitting: Neurology

## 2023-10-28 ENCOUNTER — Encounter (INDEPENDENT_AMBULATORY_CARE_PROVIDER_SITE_OTHER): Payer: Self-pay | Admitting: Neurology

## 2023-10-28 DIAGNOSIS — R202 Paresthesia of skin: Secondary | ICD-10-CM

## 2023-10-28 DIAGNOSIS — F319 Bipolar disorder, unspecified: Secondary | ICD-10-CM

## 2023-10-28 DIAGNOSIS — R269 Unspecified abnormalities of gait and mobility: Secondary | ICD-10-CM

## 2023-10-28 DIAGNOSIS — R4020 Unspecified coma: Secondary | ICD-10-CM

## 2023-10-28 DIAGNOSIS — I10 Essential (primary) hypertension: Secondary | ICD-10-CM

## 2023-10-28 NOTE — Procedures (Signed)
 Clinical History:   68 year old female being evaluated for mild cognitive impairment, and confusion spells, in the setting of mood disorder.   INTERMITTENT MONITORING with VIDEO TECHNICAL SUMMARY:  This AVEEG was performed using equipment provided by [employer] utilizing Bluetooth ( Trackit ) amplifiers with continuous EEGT attended video collection using encrypted remote transmission via [employer] secured cellular tower network with data rates for each AVEEG performed. This is a Therapist, music AVEEG, obtained, according to the 10-20 international electrode placement system, reformatted digitally into referential and bipolar montages. Data was acquired with a minimum of 21 bipolar connections and sampled at a minimum rate of 250 cycles per second per channel, maximum rate of 450 cycles per second per channel and two channels for EKG. The entire VEEG study was recorded through cable and or radio telemetry for subsequent analysis. Specified epochs of the AVEEG data were identified at the direction of the subject by the depression of a push button by the patient. Each patients event file included data acquired two minutes prior to the push button activation and continuing until two minutes afterwards. AVEEG files were reviewed on [employer] server, Licensed Software provided by [employer] with a digital high frequency filter set at 70 Hz and a low frequency filter set at 1 Hz with a paper speed of 2mm/s resulting in 10 seconds per digital page. This entire AVEEG was reviewed by the EEG Technologist. Random time samples, random sleep samples, clips, patient initiated push button files with included patient daily diary logs, EEG Technologist pruned data was reviewed and verified for accuracy and validity by the governing reading neurologist in full details. This AEEGV was fully compliant with all requirements for CPT 97500 for setup, patient education, take down and administered by an EEG  technologist.   Long-Term EEG with Video was monitored intermittently by a qualified EEG technologist for the entirety of the recording; quality check-ins were performed at a minimum of every two hours, checking and documenting real-time data and video to assure the integrity and quality of the recording (e.g., camera position, electrode integrity and impedance), and identify the need for maintenance. For intermittent monitoring, an EEG Technologist monitored no more than 12 patients concurrently. Diagnostic video was captured at least 80% of the time during the recording. PATIENT EVENTS: A notation was made 6 times. The patient log was reviewed with the patient at disconnect with the intent to reconcile events. The patient noted events for headaches, stomachache, nausea, strong smells, weakness, tiredness, anxiety, seeing things, and 'weird feelings'.   TECHNOLOGIST EVENTS: No clear epileptiform activity was detected by the reviewing neurodiagnostic technologist for further review. TIME SAMPLES: 10-minutes of every 2 hours recorded are reviewed as random time samples.   SLEEP SAMPLES: 5-minutes of every 24 hours recorded are reviewed as random sleep samples.   AWAKE: At maximal level of alertness, the posterior dominant background activity was continuous, reactive, low voltage rhythm of 7-9 Hz. This was symmetric, well-modulated, and attenuated with eye opening. Diffuse, symmetric, frontocentral beta range activity was present.  SLEEP:  N1 Sleep (Stage 1) was observed and characterized by the disappearance of alpha rhythm and the appearance of vertex activity.  N2 Sleep (Stage 2) was observed and characterized by vertex waves, K-complexes, and sleep spindles.  N3 (Stage 3) sleep was observed and characterized by high amplitude Delta activity of 20%.  REM sleep was observed.  EKG: There were no arrhythmias or abnormalities noted during this recording.   Impression: This is a normal  72 hours  ambulatory video EEG. There were a total of 6 events described as headaches, stomachache, nausea, strong smells, weakness, tiredness, anxiety, seeing things, and 'weird feelings' with no changes in EEG background. There were no epileptic seizures, epileptiform discharges and no focal slowing. Normal study.   Windell Norfolk, MD [facility]

## 2023-11-02 ENCOUNTER — Telehealth: Payer: Self-pay | Admitting: Pharmacy Technician

## 2023-11-02 DIAGNOSIS — Z5986 Financial insecurity: Secondary | ICD-10-CM

## 2023-11-02 NOTE — Progress Notes (Addendum)
 Pharmacy Medication Assistance Program Note    11/02/2023  Patient ID: ARROW EMMERICH, female   DOB: 19-Sep-1955, 68 y.o.   MRN: [id number]     09/26/2023 10/21/2023 11/02/2023  Outreach Medication One  Initial Outreach Date (Medication One) 09/22/2023    Manufacturer Medication One [employer]    [employer] Drugs Jardiance    Dose of Jardiance 10mg     Type of Radiographer, therapeutic Assistance    Date Application Sent to Patient 09/28/2023    Application Items Requested Application;Proof of Income;Other    Date Application Sent to Prescriber 09/28/2023    Name of Prescriber Carylon Perches    Date Application Received From Patient  10/20/2023   Application Items Received From Patient  Application;Other   Date Application Received From Provider  09/28/2023   Date Application Submitted to Manufacturer  10/21/2023   Method Application Sent to Manufacturer  Fax   Patient Assistance Determination   Approved  Approval Start Date   11/01/2023  Approval End Date   08/29/2024  Patient Notification Method   Telephone Call  Telephone Call Outcome   Left Voicemail   Care coordination call placed to BI in regard to Jardiance application.  Spoke to [place] who informs patient is APPROVED 11/01/23-08/29/24. She informs medication has been processed and the tracking number with UPS is [id number]. The medication will be delivered to the patient's home. She informs it should be delivered in 7-10 business days. Patient will have to call BI at [phone] to reorder the medication when patient has around 10-14 days left.  Unsuccessful outreach to patient. HIPAA compliant voicemail left notifying patient of her approval and requesting a return call.  Will send in basket message to [facility] PharmDs [name] and Casimiro Needle Maize notifying them of this information as well as for case closure as patient assistance has been completed.  ADDENDUM 11/02/23 Patient returned the call. HIPAA  verified. Informed patient of her approval refill procedure and when to expect first shipment. Patient verbalized understanding.   [name], CPhT [place]  Office: [phone] Fax: [phone] Email: [email or web address]

## 2023-11-03 DIAGNOSIS — N1831 Chronic kidney disease, stage 3a: Secondary | ICD-10-CM | POA: Diagnosis not present

## 2023-11-03 DIAGNOSIS — I10 Essential (primary) hypertension: Secondary | ICD-10-CM | POA: Diagnosis not present

## 2023-11-03 DIAGNOSIS — E1129 Type 2 diabetes mellitus with other diabetic kidney complication: Secondary | ICD-10-CM | POA: Diagnosis not present

## 2023-12-07 DIAGNOSIS — Z1231 Encounter for screening mammogram for malignant neoplasm of breast: Secondary | ICD-10-CM | POA: Diagnosis not present

## 2024-01-05 ENCOUNTER — Encounter: Payer: Self-pay | Admitting: Gastroenterology

## 2024-01-17 ENCOUNTER — Other Ambulatory Visit: Payer: Self-pay

## 2024-01-17 DIAGNOSIS — R748 Abnormal levels of other serum enzymes: Secondary | ICD-10-CM

## 2024-01-17 DIAGNOSIS — R768 Other specified abnormal immunological findings in serum: Secondary | ICD-10-CM

## 2024-01-17 DIAGNOSIS — R7989 Other specified abnormal findings of blood chemistry: Secondary | ICD-10-CM

## 2024-01-27 DIAGNOSIS — Z79899 Other long term (current) drug therapy: Secondary | ICD-10-CM | POA: Diagnosis not present

## 2024-01-27 DIAGNOSIS — I10 Essential (primary) hypertension: Secondary | ICD-10-CM | POA: Diagnosis not present

## 2024-01-27 DIAGNOSIS — E1129 Type 2 diabetes mellitus with other diabetic kidney complication: Secondary | ICD-10-CM | POA: Diagnosis not present

## 2024-01-27 DIAGNOSIS — N1832 Chronic kidney disease, stage 3b: Secondary | ICD-10-CM | POA: Diagnosis not present

## 2024-01-31 DIAGNOSIS — R7989 Other specified abnormal findings of blood chemistry: Secondary | ICD-10-CM | POA: Diagnosis not present

## 2024-01-31 DIAGNOSIS — R748 Abnormal levels of other serum enzymes: Secondary | ICD-10-CM | POA: Diagnosis not present

## 2024-01-31 DIAGNOSIS — R768 Other specified abnormal immunological findings in serum: Secondary | ICD-10-CM | POA: Diagnosis not present

## 2024-02-02 DIAGNOSIS — I5032 Chronic diastolic (congestive) heart failure: Secondary | ICD-10-CM | POA: Diagnosis not present

## 2024-02-02 DIAGNOSIS — I10 Essential (primary) hypertension: Secondary | ICD-10-CM | POA: Diagnosis not present

## 2024-02-02 DIAGNOSIS — E1129 Type 2 diabetes mellitus with other diabetic kidney complication: Secondary | ICD-10-CM | POA: Diagnosis not present

## 2024-02-02 DIAGNOSIS — N1832 Chronic kidney disease, stage 3b: Secondary | ICD-10-CM | POA: Diagnosis not present

## 2024-02-02 DIAGNOSIS — F319 Bipolar disorder, unspecified: Secondary | ICD-10-CM | POA: Diagnosis not present

## 2024-02-03 LAB — CBC WITH DIFFERENTIAL/PLATELET
Basophils Absolute: 0 10*3/uL (ref 0.0–0.2)
Basos: 1 %
EOS (ABSOLUTE): 0.1 10*3/uL (ref 0.0–0.4)
Eos: 3 %
Hematocrit: 46.5 % (ref 34.0–46.6)
Hemoglobin: 14.8 g/dL (ref 11.1–15.9)
Immature Grans (Abs): 0 10*3/uL (ref 0.0–0.1)
Immature Granulocytes: 0 %
Lymphocytes Absolute: 1.6 10*3/uL (ref 0.7–3.1)
Lymphs: 44 %
MCH: 26.2 pg — ABNORMAL LOW (ref 26.6–33.0)
MCHC: 31.8 g/dL (ref 31.5–35.7)
MCV: 82 fL (ref 79–97)
Monocytes Absolute: 0.3 10*3/uL (ref 0.1–0.9)
Monocytes: 9 %
Neutrophils Absolute: 1.6 10*3/uL (ref 1.4–7.0)
Neutrophils: 43 %
Platelets: 325 10*3/uL (ref 150–450)
RBC: 5.65 x10E6/uL — ABNORMAL HIGH (ref 3.77–5.28)
RDW: 13.8 % (ref 11.7–15.4)
WBC: 3.7 10*3/uL (ref 3.4–10.8)

## 2024-02-03 LAB — COMPREHENSIVE METABOLIC PANEL WITH GFR
ALT: 18 IU/L (ref 0–32)
AST: 22 IU/L (ref 0–40)
Albumin: 4.6 g/dL (ref 3.9–4.9)
Alkaline Phosphatase: 137 IU/L — ABNORMAL HIGH (ref 44–121)
BUN/Creatinine Ratio: 12 (ref 12–28)
BUN: 14 mg/dL (ref 8–27)
Bilirubin Total: 0.2 mg/dL (ref 0.0–1.2)
CO2: 21 mmol/L (ref 20–29)
Calcium: 9.5 mg/dL (ref 8.7–10.3)
Chloride: 103 mmol/L (ref 96–106)
Creatinine, Ser: 1.15 mg/dL — ABNORMAL HIGH (ref 0.57–1.00)
Globulin, Total: 2.5 g/dL (ref 1.5–4.5)
Glucose: 97 mg/dL (ref 70–99)
Potassium: 4 mmol/L (ref 3.5–5.2)
Sodium: 141 mmol/L (ref 134–144)
Total Protein: 7.1 g/dL (ref 6.0–8.5)
eGFR: 53 mL/min/{1.73_m2} — ABNORMAL LOW (ref >59)

## 2024-02-03 LAB — ALKALINE PHOSPHATASE, ISOENZYMES
BONE FRACTION: 42 % (ref 14–68)
INTESTINAL FRAC.: 1 % (ref 0–18)
LIVER FRACTION: 57 % (ref 18–85)

## 2024-02-03 LAB — GAMMA GT: GGT: 23 IU/L (ref 0–60)

## 2024-02-03 LAB — MITOCHONDRIAL ANTIBODIES: Mitochondrial Ab: <20 U (ref 0.0–20.0)

## 2024-02-09 ENCOUNTER — Ambulatory Visit: Payer: Self-pay | Admitting: Gastroenterology

## 2024-02-15 ENCOUNTER — Encounter: Payer: Self-pay | Admitting: Gastroenterology

## 2024-02-15 ENCOUNTER — Telehealth: Payer: Self-pay | Admitting: *Deleted

## 2024-02-15 ENCOUNTER — Ambulatory Visit (INDEPENDENT_AMBULATORY_CARE_PROVIDER_SITE_OTHER): Admitting: Gastroenterology

## 2024-02-15 VITALS — BP 143/82 | HR 60 | Temp 98.0°F | Ht 61.0 in | Wt 141.4 lb

## 2024-02-15 DIAGNOSIS — R198 Other specified symptoms and signs involving the digestive system and abdomen: Secondary | ICD-10-CM

## 2024-02-15 DIAGNOSIS — R194 Change in bowel habit: Secondary | ICD-10-CM

## 2024-02-15 DIAGNOSIS — K6289 Other specified diseases of anus and rectum: Secondary | ICD-10-CM

## 2024-02-15 DIAGNOSIS — R42 Dizziness and giddiness: Secondary | ICD-10-CM | POA: Diagnosis not present

## 2024-02-15 DIAGNOSIS — K219 Gastro-esophageal reflux disease without esophagitis: Secondary | ICD-10-CM | POA: Diagnosis not present

## 2024-02-15 DIAGNOSIS — R109 Unspecified abdominal pain: Secondary | ICD-10-CM | POA: Diagnosis not present

## 2024-02-15 DIAGNOSIS — Z85048 Personal history of other malignant neoplasm of rectum, rectosigmoid junction, and anus: Secondary | ICD-10-CM

## 2024-02-15 DIAGNOSIS — R748 Abnormal levels of other serum enzymes: Secondary | ICD-10-CM | POA: Diagnosis not present

## 2024-02-15 DIAGNOSIS — R7989 Other specified abnormal findings of blood chemistry: Secondary | ICD-10-CM

## 2024-02-15 NOTE — H&P (View-Only) (Signed)
 GI Office Note    Referring Provider: Sheryle Carwin, MD Primary Care Physician:  Sheryle Carwin, MD  Primary Gastroenterologist: Ozell Hollingshead, MD   Chief Complaint   Chief Complaint  Patient presents with   Follow-up    Has been having issues with a lot of gas and painful/itching hemorrhoids.     History of Present Illness   Linda English is a 68 y.o. female presenting today for follow up. Last seen 10/2023. H/o GERD, IBS-C, chronic intermittent elevated AP.   Complicated GI history to include history of rectal cancer status post low anterior resection in 2003, alternating constipation and diarrhea status post evaluation at [facility] with pelvic floor dysfunction and underwent anorectal manometry that showed type IV dyssynergy, GERD, dysphagia, nausea and vomiting with multiple imaging studies including CT head/MRI brain/CT abdomen pelvis and evaluated at [facility] with recommendations to continue Marinol . GES normal. Colonoscopy due 04/2025 for adenomatous colon polyps. EGD in 2021 with normal-appearing esophagus status post empiric dilation with large bore [facility] dilator, provided good relief. Right upper quadrant ultrasound 09/18/2020 with unremarkable liver, CBD 3 mm.   Chronically elevated alkaline phosphatase.  GGT has been normal.  AMA however slightly elevated but not enough to call positive.  Liver ultrasound in 2022 unremarkable.  Previous hepatitis B surface antigen negative, hepatitis B surface antibody negative, hepatitis B core antibody negative, hepatitis C antibody negative, hepatitis A total antibody negative, iron and ferritin unremarkable. PBC profile 02/2023 with positive ANA, neg Anti mitochondrial ab, neg anti M2, neg Anti GP 210, neg Anti SP 100, pos ASMA. Referred to rheumatology for evaluation of positive ANA. Further checked IgG and IgM both normal. Alk phos isoenzymes returned with a normal alk phos for the first time in awhile. Her isoenzymes with 65% liver,  35% bone.   Since last OV, she completed additional labs. Labs earlier this month, GGT 23, mitochondrial Ab <20, CBC normal, Tbili 0.2, AP 137, AST 22, ALT 18, AP isoenzymes, liver fraction 57/bone 42/intestinal 1.   Today: [place] 3-7. Several per day. Urgency. Itching in periannaly. Hemorrhoid pushed out. Painful. Prep H seemed to help. Got better with more formed stool. Alternates with formed stool. Sometimes may have to skip a few days. No melena, brbpr. Sometimes irritated around anal part.   Stool change over few months. No new medication changes prior to onset. No recent antibiotics.   Quite a bit of nausea and stomach aches. Nausea worse in mornings and night. Dizziness had went away after ov. But dizziness started again week or so ago. Has to sit still, cannot get up. Room spinning as part of symptoms. This last time it happened for one day.   Some heartburn but not much. Some resting chest pain. Thinks it is stress. Gets tired with ambulating in the store. No diaphoresis. No significant dysphagia.   Sometimes when gets up in the morning to urinate, has pain in the right lower back that radiates to the right groin, often gets better with urination.     Medications   Current Outpatient Medications  Medication Sig Dispense Refill   acetaminophen  (TYLENOL ) 500 MG tablet Take 1,000 mg by mouth every 8 (eight) hours as needed for moderate pain or headache.     Alcohol Swabs (ALCOHOL PREP) 70 % PADS SMARTSIG:Pledget(s) Topical Daily     amLODipine  (NORVASC ) 5 MG tablet Take 1 tablet by mouth every evening.     clobetasol cream (TEMOVATE) 0.05 % Apply topically.  famotidine  (PEPCID ) 40 MG tablet Take 40 mg by mouth daily.     furosemide  (LASIX ) 20 MG tablet Take 20 mg by mouth daily.     JARDIANCE 10 MG TABS tablet Take 10 mg by mouth daily.     lamoTRIgine  (LAMICTAL ) 200 MG tablet Take 200 mg by mouth 2 (two) times daily.     LORazepam  (ATIVAN ) 1 MG tablet Take 1 mg by mouth 3  (three) times daily as needed for anxiety.     metoprolol  succinate (TOPROL -XL) 50 MG 24 hr tablet Take 1 tablet (50 mg total) by mouth daily. Take with or immediately following a meal. 30 tablet 1   Multiple Vitamin (MULTIVITAMIN) TABS Take 1 tablet by mouth daily.     nystatin  cream (MYCOSTATIN ) Apply 1 Application topically 2 (two) times daily.     olmesartan (BENICAR) 40 MG tablet SMARTSIG:1.0 Tablet(s) By Mouth Daily     pantoprazole  (PROTONIX ) 40 MG tablet TAKE ONE TABLET BY MOUTH DAILY AT 9AM 30 MIN BEFORE BREAKFAST 90 tablet 3   pravastatin (PRAVACHOL) 20 MG tablet Take 20 mg by mouth at bedtime.     triamcinolone cream (KENALOG) 0.1 % APPLY A THIN LAYER TO THE AFFECTED AREA(S) BY TOPICAL ROUTE 2 TIMES PER DAY     No current facility-administered medications for this visit.    Allergies   Allergies as of 02/15/2024 - Review Complete 02/15/2024  Allergen Reaction Noted   Lithium  Nausea And Vomiting 06/27/2015   Diltiazem  Other (See Comments) 08/16/2022   Excedrin extra strength [aspirin -acetaminophen -caffeine] Other (See Comments) 10/28/2014   Geodon [ziprasidone hcl]  05/01/2020   Klonopin  [clonazepam ] Other (See Comments) 10/23/2019     Past Medical History   Past Medical History:  Diagnosis Date   Acid reflux    Allergic rhinitis    Anxiety    Arthritis    osteoarthritis   Bipolar disorder (HCC)    Chronic headache    Depression    Diverticula of colon    DM (diabetes mellitus) (HCC)    Gastric erosions    Gastric polyps    benign    Hemorrhoids    Hiatal hernia    Hypertension    IBS (irritable bowel syndrome)    Lichen planus    Obstructive sleep apnea    Panic disorder    Parkinson's disease (HCC)    Rectal cancer (HCC) 2003   ileostomy and reversal   Subdural hematoma (HCC)    Tubular adenoma     Past Surgical History   Past Surgical History:  Procedure Laterality Date   ABDOMINAL HYSTERECTOMY     APPENDECTOMY     BACK SURGERY     BIOPSY   10/01/2019   Procedure: BIOPSY;  Surgeon: Shaaron Lamar HERO, MD;  [facility];  Service: Endoscopy;;  gastric   CESAREAN SECTION     X  2   CHOLECYSTECTOMY     COLONOSCOPY  08/2007   DR SHAARON, friable anal canal hemorrhoids, surgical anastomosis at 3cm, distal scattered tics   COLONOSCOPY  12/15/2010   anal papilla and internal hemorrhoids/diminutive polyp in the base of the cecum. Past, tubular adenoma.. Next colonoscopy due in April 2017.   COLONOSCOPY  10/27/2005   RMR: Anal canal hemorrhoids. Surgical anastomosis at 3 cm from the anal verge appeared normal. Few scattered distal diverticula. The residual  colonic mucosa appeared normal. I suspect the patient bled from hemorrhoids   COLONOSCOPY N/A 01/15/2015   MFM:wnmfjo residual rectum and colon.  next tcs 12/2019   COLONOSCOPY WITH PROPOFOL  N/A 05/01/2020   Procedure: COLONOSCOPY WITH PROPOFOL ;  Surgeon: Shaaron Lamar HERO, MD;  Two 3-8 millimeters polyps in ascending colon, diverticulosis in descending colon, surgical anastomosis at 4 cm, otherwise normal exam.  Pathology with tubular adenomas.  Repeat colonoscopy in 5 years.   ESOPHAGEAL DILATION N/A 06/27/2015   Procedure: ESOPHAGEAL DILATION;  Surgeon: Lamar HERO Shaaron, MD;  [facility];  Service: Endoscopy;  Laterality: N/A;   ESOPHAGOGASTRODUODENOSCOPY  05/2002   DR SHAARON, normal   ESOPHAGOGASTRODUODENOSCOPY  08/03/2011   RMR: small HH/ gastric polyps   ESOPHAGOGASTRODUODENOSCOPY N/A 06/27/2015   Dr. Shaaron: Mild erosive reflux esophagitits. Status post passage of a Maloney dilator. Hiatal hernia. Gastric polyps and abnormal gastric mucosa of doubtful clinical significane. status post biopsy, benign fundic gland polyp, negative H.pylori   ESOPHAGOGASTRODUODENOSCOPY (EGD) WITH PROPOFOL  N/A 10/01/2019   rourk: Normal esophagus status post esophageal dilation for dysphagia, erythematous mucosa in the stomach biopsy consistent with reactive gastropathy, no H. pylori, multiple  gastric polyps (biopsy fundic gland)   low anterior rection  2003   RECTAL CANCER   MALONEY DILATION N/A 10/01/2019   Procedure: MALONEY DILATION;  Surgeon: Shaaron Lamar HERO, MD;  [facility];  Service: Endoscopy;  Laterality: N/A;   POLYPECTOMY  05/01/2020   Procedure: POLYPECTOMY;  Surgeon: Shaaron Lamar HERO, MD;  [facility];  Service: Endoscopy;;   SPINE SURGERY  2001   L5,S1 HEMILAMINOTOMY AND DISCECTOMY/DR DEATON    Past Family History   Family History  Problem Relation Age of Onset   Diabetes Mother    Hypertension Mother    Hypertension Father    Heart attack Father    Heart failure Father    Colon polyps Brother    Colon cancer Paternal Grandfather 69   Colon polyps Cousin     Past Social History   Social History   Socioeconomic History   Marital status: Divorced    Spouse name: Not on file   Number of children: 2   Years of education: 12   Highest education level: 12th grade  Occupational History   Occupation: Magazine features editor: RETIRED    Comment: [employer] [address]  Tobacco Use   Smoking status: Never    Passive exposure: Never   Smokeless tobacco: Never   Tobacco comments:    Never smoked  Vaping Use   Vaping status: Never Used  Substance and Sexual Activity   Alcohol use: No   Drug use: No   Sexual activity: Not Currently    Partners: Male  Other Topics Concern   Not on file  Social History Narrative   Not on file   Social Drivers of Health   Financial Resource Strain: Medium Risk (09/22/2023)   Overall Financial Resource Strain (CARDIA)    Difficulty of Paying Living Expenses: Somewhat hard  Food Insecurity: No Food Insecurity (04/08/2022)   Hunger Vital Sign    Worried About Running Out of Food in the Last Year: Never true    Ran Out of Food in the Last Year: Never true  Transportation Needs: Unmet Transportation Needs (04/08/2022)   PRAPARE - Administrator, Civil Service (Medical): Yes    Lack of Transportation  (Non-Medical): No  Physical Activity: Inactive (04/08/2022)   Exercise Vital Sign    Days of Exercise per Week: 0 days    Minutes of Exercise per Session: 0 min  Stress: No Stress Concern  Present (04/08/2022)   [employer] of Occupational Health - Occupational Stress Questionnaire    Feeling of Stress : Only a little  Social Connections: Moderately Isolated (04/08/2022)   Social Connection and Isolation Panel    Frequency of Communication with Friends and Family: More than three times a week    Frequency of Social Gatherings with Friends and Family: More than three times a week    Attends Religious Services: More than 4 times per year    Active Member of [employer] or Organizations: No    Attends Banker Meetings: Never    Marital Status: Divorced  Catering manager Violence: Not At Risk (04/08/2022)   Humiliation, Afraid, Rape, and Kick questionnaire    Fear of Current or Ex-Partner: No    Emotionally Abused: No    Physically Abused: No    Sexually Abused: No    Review of Systems   General: Negative for anorexia, weight loss, fever, chills, fatigue, weakness. ENT: Negative for hoarseness, difficulty swallowing , nasal congestion. CV: Negative for chest pain, angina, palpitations, dyspnea on exertion, peripheral edema.  Respiratory: Negative for dyspnea at rest, dyspnea on exertion, cough, sputum, wheezing.  GI: See history of present illness. GU:  Negative for dysuria, hematuria, urinary incontinence, urinary frequency, nocturnal urination.  Endo: Negative for unusual weight change.     Physical Exam   BP (!) 143/82 (BP Location: Right Arm, Patient Position: Sitting, Cuff Size: Normal)   Pulse 60   Temp 98 F (36.7 C) (Oral)   Ht 5' 1 (1.549 m)   Wt 141 lb 6.4 oz (64.1 kg)   SpO2 94%   BMI 26.72 kg/m    General: Well-nourished, well-developed in no acute distress.  Eyes: No icterus. Mouth: Oropharyngeal mucosa moist and pink   Lungs: Clear to auscultation  bilaterally.  Heart: Regular rate and rhythm, no murmurs rubs or gallops.  Abdomen: Bowel sounds are normal,nondistended, no hepatosplenomegaly or masses,  no abdominal bruits no rebound or guarding. Some tenderness over upper midline incision Rectal: not performed Extremities: No lower extremity edema. No clubbing or deformities. Neuro: Alert and oriented x 4   Skin: Warm and dry, no jaundice.   Psych: Alert and cooperative, normal mood and affect.  Labs   See hpi  Imaging Studies   No results found.  Assessment/Plan:   GERD:  overall improved, no alarm symptoms -continue pantoprazole  40mg  daily before breakfast. -may use OTC TUMS on occasion but if requires regularly we should change her chronic PPI    Change in bowels/rectal discomfort: -could be related to IBS and benign anorectal source but given her history of rectal cancer, last colonoscopy 2021, would recommend updating in near future.  -colonoscopy. ASA 3, rm 1 or 2 ok - I have discussed the risks, alternatives, benefits with regards to but not limited to the risk of reaction to medication, bleeding, infection, perforation and the patient is agreeable to proceed. Written consent to be obtained.  Elevated alk phos: -intermittent chronic mild elevation of alk phos -AMA equivocal initially, then negative on PBC profile, repeated AMA negative recently -GGT remains normal -alk phos isoenzymes unremarkable -last liver imaging RUQ U/S 08/2020, unremarkable -not likely due to liver source, continue to monitor     Dizziness:  -evaluated by neurology -describes some vertigo type symptoms -follow up with PCP    Vague right sided abdominal pain in mornings radiates from right back to right groin, improves with first urination. Etiology not clear. Possibly MSK, urinary source.  Await colonoscopy, could consider CT if ongoing.    Sonny RAMAN. Ezzard, MHS, PA-C [facility]

## 2024-02-15 NOTE — Telephone Encounter (Signed)
 LMOVM to return call  TCS w/Dr.Rourk, asa 3, ok rm 1/2 Needs trilyte  BMET

## 2024-02-15 NOTE — Patient Instructions (Signed)
 We will get you scheduled for colonoscopy in the near future for change of bowels.   Please let me know if any new GI (stomach/bowel) symptoms between now and your colonoscopy.   Consider letting PCP know about your dizziness, chest discomfort.

## 2024-02-15 NOTE — Progress Notes (Signed)
 GI Office Note    Referring Provider: Sheryle Carwin, MD Primary Care Physician:  Sheryle Carwin, MD  Primary Gastroenterologist: Ozell Hollingshead, MD   Chief Complaint   Chief Complaint  Patient presents with   Follow-up    Has been having issues with a lot of gas and painful/itching hemorrhoids.     History of Present Illness   Linda English is a 68 y.o. female presenting today for follow up. Last seen 10/2023. H/o GERD, IBS-C, chronic intermittent elevated AP.   Complicated GI history to include history of rectal cancer status post low anterior resection in 2003, alternating constipation and diarrhea status post evaluation at [facility] with pelvic floor dysfunction and underwent anorectal manometry that showed type IV dyssynergy, GERD, dysphagia, nausea and vomiting with multiple imaging studies including CT head/MRI brain/CT abdomen pelvis and evaluated at [facility] with recommendations to continue Marinol . GES normal. Colonoscopy due 04/2025 for adenomatous colon polyps. EGD in 2021 with normal-appearing esophagus status post empiric dilation with large bore [facility] dilator, provided good relief. Right upper quadrant ultrasound 09/18/2020 with unremarkable liver, CBD 3 mm.   Chronically elevated alkaline phosphatase.  GGT has been normal.  AMA however slightly elevated but not enough to call positive.  Liver ultrasound in 2022 unremarkable.  Previous hepatitis B surface antigen negative, hepatitis B surface antibody negative, hepatitis B core antibody negative, hepatitis C antibody negative, hepatitis A total antibody negative, iron and ferritin unremarkable. PBC profile 02/2023 with positive ANA, neg Anti mitochondrial ab, neg anti M2, neg Anti GP 210, neg Anti SP 100, pos ASMA. Referred to rheumatology for evaluation of positive ANA. Further checked IgG and IgM both normal. Alk phos isoenzymes returned with a normal alk phos for the first time in awhile. Her isoenzymes with 65% liver,  35% bone.   Since last OV, she completed additional labs. Labs earlier this month, GGT 23, mitochondrial Ab <20, CBC normal, Tbili 0.2, AP 137, AST 22, ALT 18, AP isoenzymes, liver fraction 57/bone 42/intestinal 1.   Today: [place] 3-7. Several per day. Urgency. Itching in periannaly. Hemorrhoid pushed out. Painful. Prep H seemed to help. Got better with more formed stool. Alternates with formed stool. Sometimes may have to skip a few days. No melena, brbpr. Sometimes irritated around anal part.   Stool change over few months. No new medication changes prior to onset. No recent antibiotics.   Quite a bit of nausea and stomach aches. Nausea worse in mornings and night. Dizziness had went away after ov. But dizziness started again week or so ago. Has to sit still, cannot get up. Room spinning as part of symptoms. This last time it happened for one day.   Some heartburn but not much. Some resting chest pain. Thinks it is stress. Gets tired with ambulating in the store. No diaphoresis. No significant dysphagia.   Sometimes when gets up in the morning to urinate, has pain in the right lower back that radiates to the right groin, often gets better with urination.     Medications   Current Outpatient Medications  Medication Sig Dispense Refill   acetaminophen  (TYLENOL ) 500 MG tablet Take 1,000 mg by mouth every 8 (eight) hours as needed for moderate pain or headache.     Alcohol Swabs (ALCOHOL PREP) 70 % PADS SMARTSIG:Pledget(s) Topical Daily     amLODipine  (NORVASC ) 5 MG tablet Take 1 tablet by mouth every evening.     clobetasol cream (TEMOVATE) 0.05 % Apply topically.  famotidine  (PEPCID ) 40 MG tablet Take 40 mg by mouth daily.     furosemide  (LASIX ) 20 MG tablet Take 20 mg by mouth daily.     JARDIANCE 10 MG TABS tablet Take 10 mg by mouth daily.     lamoTRIgine  (LAMICTAL ) 200 MG tablet Take 200 mg by mouth 2 (two) times daily.     LORazepam  (ATIVAN ) 1 MG tablet Take 1 mg by mouth 3  (three) times daily as needed for anxiety.     metoprolol  succinate (TOPROL -XL) 50 MG 24 hr tablet Take 1 tablet (50 mg total) by mouth daily. Take with or immediately following a meal. 30 tablet 1   Multiple Vitamin (MULTIVITAMIN) TABS Take 1 tablet by mouth daily.     nystatin  cream (MYCOSTATIN ) Apply 1 Application topically 2 (two) times daily.     olmesartan (BENICAR) 40 MG tablet SMARTSIG:1.0 Tablet(s) By Mouth Daily     pantoprazole  (PROTONIX ) 40 MG tablet TAKE ONE TABLET BY MOUTH DAILY AT 9AM 30 MIN BEFORE BREAKFAST 90 tablet 3   pravastatin (PRAVACHOL) 20 MG tablet Take 20 mg by mouth at bedtime.     triamcinolone cream (KENALOG) 0.1 % APPLY A THIN LAYER TO THE AFFECTED AREA(S) BY TOPICAL ROUTE 2 TIMES PER DAY     No current facility-administered medications for this visit.    Allergies   Allergies as of 02/15/2024 - Review Complete 02/15/2024  Allergen Reaction Noted   Lithium  Nausea And Vomiting 06/27/2015   Diltiazem  Other (See Comments) 08/16/2022   Excedrin extra strength [aspirin -acetaminophen -caffeine] Other (See Comments) 10/28/2014   Geodon [ziprasidone hcl]  05/01/2020   Klonopin  [clonazepam ] Other (See Comments) 10/23/2019     Past Medical History   Past Medical History:  Diagnosis Date   Acid reflux    Allergic rhinitis    Anxiety    Arthritis    osteoarthritis   Bipolar disorder (HCC)    Chronic headache    Depression    Diverticula of colon    DM (diabetes mellitus) (HCC)    Gastric erosions    Gastric polyps    benign    Hemorrhoids    Hiatal hernia    Hypertension    IBS (irritable bowel syndrome)    Lichen planus    Obstructive sleep apnea    Panic disorder    Parkinson's disease (HCC)    Rectal cancer (HCC) 2003   ileostomy and reversal   Subdural hematoma (HCC)    Tubular adenoma     Past Surgical History   Past Surgical History:  Procedure Laterality Date   ABDOMINAL HYSTERECTOMY     APPENDECTOMY     BACK SURGERY     BIOPSY   10/01/2019   Procedure: BIOPSY;  Surgeon: Shaaron Lamar HERO, MD;  [facility];  Service: Endoscopy;;  gastric   CESAREAN SECTION     X  2   CHOLECYSTECTOMY     COLONOSCOPY  08/2007   DR SHAARON, friable anal canal hemorrhoids, surgical anastomosis at 3cm, distal scattered tics   COLONOSCOPY  12/15/2010   anal papilla and internal hemorrhoids/diminutive polyp in the base of the cecum. Past, tubular adenoma.. Next colonoscopy due in April 2017.   COLONOSCOPY  10/27/2005   RMR: Anal canal hemorrhoids. Surgical anastomosis at 3 cm from the anal verge appeared normal. Few scattered distal diverticula. The residual  colonic mucosa appeared normal. I suspect the patient bled from hemorrhoids   COLONOSCOPY N/A 01/15/2015   MFM:wnmfjo residual rectum and colon.  next tcs 12/2019   COLONOSCOPY WITH PROPOFOL  N/A 05/01/2020   Procedure: COLONOSCOPY WITH PROPOFOL ;  Surgeon: Shaaron Lamar HERO, MD;  Two 3-8 millimeters polyps in ascending colon, diverticulosis in descending colon, surgical anastomosis at 4 cm, otherwise normal exam.  Pathology with tubular adenomas.  Repeat colonoscopy in 5 years.   ESOPHAGEAL DILATION N/A 06/27/2015   Procedure: ESOPHAGEAL DILATION;  Surgeon: Lamar HERO Shaaron, MD;  [facility];  Service: Endoscopy;  Laterality: N/A;   ESOPHAGOGASTRODUODENOSCOPY  05/2002   DR SHAARON, normal   ESOPHAGOGASTRODUODENOSCOPY  08/03/2011   RMR: small HH/ gastric polyps   ESOPHAGOGASTRODUODENOSCOPY N/A 06/27/2015   Dr. Shaaron: Mild erosive reflux esophagitits. Status post passage of a Maloney dilator. Hiatal hernia. Gastric polyps and abnormal gastric mucosa of doubtful clinical significane. status post biopsy, benign fundic gland polyp, negative H.pylori   ESOPHAGOGASTRODUODENOSCOPY (EGD) WITH PROPOFOL  N/A 10/01/2019   rourk: Normal esophagus status post esophageal dilation for dysphagia, erythematous mucosa in the stomach biopsy consistent with reactive gastropathy, no H. pylori, multiple  gastric polyps (biopsy fundic gland)   low anterior rection  2003   RECTAL CANCER   MALONEY DILATION N/A 10/01/2019   Procedure: MALONEY DILATION;  Surgeon: Shaaron Lamar HERO, MD;  [facility];  Service: Endoscopy;  Laterality: N/A;   POLYPECTOMY  05/01/2020   Procedure: POLYPECTOMY;  Surgeon: Shaaron Lamar HERO, MD;  [facility];  Service: Endoscopy;;   SPINE SURGERY  2001   L5,S1 HEMILAMINOTOMY AND DISCECTOMY/DR DEATON    Past Family History   Family History  Problem Relation Age of Onset   Diabetes Mother    Hypertension Mother    Hypertension Father    Heart attack Father    Heart failure Father    Colon polyps Brother    Colon cancer Paternal Grandfather 69   Colon polyps Cousin     Past Social History   Social History   Socioeconomic History   Marital status: Divorced    Spouse name: Not on file   Number of children: 2   Years of education: 12   Highest education level: 12th grade  Occupational History   Occupation: Magazine features editor: RETIRED    Comment: [employer] [address]  Tobacco Use   Smoking status: Never    Passive exposure: Never   Smokeless tobacco: Never   Tobacco comments:    Never smoked  Vaping Use   Vaping status: Never Used  Substance and Sexual Activity   Alcohol use: No   Drug use: No   Sexual activity: Not Currently    Partners: Male  Other Topics Concern   Not on file  Social History Narrative   Not on file   Social Drivers of Health   Financial Resource Strain: Medium Risk (09/22/2023)   Overall Financial Resource Strain (CARDIA)    Difficulty of Paying Living Expenses: Somewhat hard  Food Insecurity: No Food Insecurity (04/08/2022)   Hunger Vital Sign    Worried About Running Out of Food in the Last Year: Never true    Ran Out of Food in the Last Year: Never true  Transportation Needs: Unmet Transportation Needs (04/08/2022)   PRAPARE - Administrator, Civil Service (Medical): Yes    Lack of Transportation  (Non-Medical): No  Physical Activity: Inactive (04/08/2022)   Exercise Vital Sign    Days of Exercise per Week: 0 days    Minutes of Exercise per Session: 0 min  Stress: No Stress Concern  Present (04/08/2022)   [employer] of Occupational Health - Occupational Stress Questionnaire    Feeling of Stress : Only a little  Social Connections: Moderately Isolated (04/08/2022)   Social Connection and Isolation Panel    Frequency of Communication with Friends and Family: More than three times a week    Frequency of Social Gatherings with Friends and Family: More than three times a week    Attends Religious Services: More than 4 times per year    Active Member of [employer] or Organizations: No    Attends Banker Meetings: Never    Marital Status: Divorced  Catering manager Violence: Not At Risk (04/08/2022)   Humiliation, Afraid, Rape, and Kick questionnaire    Fear of Current or Ex-Partner: No    Emotionally Abused: No    Physically Abused: No    Sexually Abused: No    Review of Systems   General: Negative for anorexia, weight loss, fever, chills, fatigue, weakness. ENT: Negative for hoarseness, difficulty swallowing , nasal congestion. CV: Negative for chest pain, angina, palpitations, dyspnea on exertion, peripheral edema.  Respiratory: Negative for dyspnea at rest, dyspnea on exertion, cough, sputum, wheezing.  GI: See history of present illness. GU:  Negative for dysuria, hematuria, urinary incontinence, urinary frequency, nocturnal urination.  Endo: Negative for unusual weight change.     Physical Exam   BP (!) 143/82 (BP Location: Right Arm, Patient Position: Sitting, Cuff Size: Normal)   Pulse 60   Temp 98 F (36.7 C) (Oral)   Ht 5' 1 (1.549 m)   Wt 141 lb 6.4 oz (64.1 kg)   SpO2 94%   BMI 26.72 kg/m    General: Well-nourished, well-developed in no acute distress.  Eyes: No icterus. Mouth: Oropharyngeal mucosa moist and pink   Lungs: Clear to auscultation  bilaterally.  Heart: Regular rate and rhythm, no murmurs rubs or gallops.  Abdomen: Bowel sounds are normal,nondistended, no hepatosplenomegaly or masses,  no abdominal bruits no rebound or guarding. Some tenderness over upper midline incision Rectal: not performed Extremities: No lower extremity edema. No clubbing or deformities. Neuro: Alert and oriented x 4   Skin: Warm and dry, no jaundice.   Psych: Alert and cooperative, normal mood and affect.  Labs   See hpi  Imaging Studies   No results found.  Assessment/Plan:   GERD:  overall improved, no alarm symptoms -continue pantoprazole  40mg  daily before breakfast. -may use OTC TUMS on occasion but if requires regularly we should change her chronic PPI    Change in bowels/rectal discomfort: -could be related to IBS and benign anorectal source but given her history of rectal cancer, last colonoscopy 2021, would recommend updating in near future.  -colonoscopy. ASA 3, rm 1 or 2 ok - I have discussed the risks, alternatives, benefits with regards to but not limited to the risk of reaction to medication, bleeding, infection, perforation and the patient is agreeable to proceed. Written consent to be obtained.  Elevated alk phos: -intermittent chronic mild elevation of alk phos -AMA equivocal initially, then negative on PBC profile, repeated AMA negative recently -GGT remains normal -alk phos isoenzymes unremarkable -last liver imaging RUQ U/S 08/2020, unremarkable -not likely due to liver source, continue to monitor     Dizziness:  -evaluated by neurology -describes some vertigo type symptoms -follow up with PCP    Vague right sided abdominal pain in mornings radiates from right back to right groin, improves with first urination. Etiology not clear. Possibly MSK, urinary source.  Await colonoscopy, could consider CT if ongoing.    Sonny RAMAN. Ezzard, MHS, PA-C [facility]

## 2024-02-16 ENCOUNTER — Encounter: Payer: Self-pay | Admitting: *Deleted

## 2024-02-16 ENCOUNTER — Other Ambulatory Visit: Payer: Self-pay | Admitting: *Deleted

## 2024-02-16 DIAGNOSIS — R194 Change in bowel habit: Secondary | ICD-10-CM

## 2024-02-16 DIAGNOSIS — C20 Malignant neoplasm of rectum: Secondary | ICD-10-CM

## 2024-02-16 MED ORDER — PEG 3350-KCL-NA BICARB-NACL 420 G PO SOLR
4000.0000 mL | Freq: Once | ORAL | 0 refills | Status: AC
Start: 2024-02-16 — End: 2024-02-16

## 2024-02-16 NOTE — Telephone Encounter (Signed)
 LMOVM to return call   Pt left vm returning call to schedule procedure.

## 2024-02-16 NOTE — Telephone Encounter (Signed)
 Pt has been scheduled for 03/12/24. Instructions mailed and prep sent to pharmacy..   [employer] PA:  Approved Authorization #[id number]  Tracking #[id number] Dates of service 03/12/2024 - 06/11/2024

## 2024-02-17 DIAGNOSIS — K6289 Other specified diseases of anus and rectum: Secondary | ICD-10-CM | POA: Insufficient documentation

## 2024-03-04 NOTE — Patient Instructions (Signed)

## 2024-03-04 NOTE — Progress Notes (Unsigned)
 No chief complaint on file.   HISTORY OF PRESENT ILLNESS:  03/04/24 ALL:  Linda English returns for follow up for confusion and reports of convulsions. She was last seen by Dr Onita 07/2023. MRI brain showed mild atrophy. Cervical spine MRI showed previous ACDF C6-7 and multilevel degenerative changes. No cervical cord or nerve root compression. Extended EEG normal.   08/17/2023 YY: Accompanied by her mother at today's clinical visit, Note from her daughter, described moments of confusion, during those moments, she could be doing repetitive movement shaking her legs, unsteady gait, sometimes for bumping into things, she there was also excessive movement such as smack her lips like chewing gum,   Patient is also complained of confusion, unsteady gait, feeling nervous  03/07/2023 ALL:  Linda English is a 68 y.o. female here today for follow up for episodes of confusion and dizziness. She was seen in consult with Dr Onita 07/2022. EEG and labs normal. She reports doing well. She denies episodes of confusion but does report an event last week where she felt that she was frozen for about 30 minutes. She was walking down stairs and suddenly was unable to move her legs. Her family met her on the stairs and she was able to walk to the car. 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 She was aware of her surroundings. She feels mood is stable. She is sleeping well. She continues lamotrigine  per psychiatry. BP is usually 140-150/90's. She admits she has not taken her HTN meds, today.    HISTORY (copied from Dr Georgianne previous note)  Linda English is a 68 year old female, seen in request by primary care physician Dr., Sheryle Carwin, to follow-up neurological condition, she was seen by [facility] neurologist Dr. Milton, Kofi, in the past   I reviewed and summarized the referring note. PMHx DM Bipolar HLD HTN Obstructive sleep apnea, could not tolerate  CPAP, History of subdural hematoma in  Cervical decompression surgery, presenting to chronic neck pain, radiating pain to her left shoulder, gait abnormality Rectal cancer,   Lumbar decompression surgery. Hx of rectal cancer, s/p surgery 2003, and 2003, ileostomy and redo,  did not require radiation and chemo     She presented to [facility] emergency room on February 20, 2021 after fell at home that was witnessed by her daughter, she suffered long depression anxiety, the day of emergency presentation, she was rushing to the refrigerator end up fell, daughter and daughter witnessed the episode, reported patient was very anxious, impending   She also complains of intermittent low back pain, as it knife is currently through her back, bilateral hands and feet paresthesia, difficult to keep her train of thoughts, short-term memory loss, frequent lightheadedness dizziness especially when standing up position,   She is already on lamotrigine  200 mg twice a day from her psychiatrist, missing her medication sometimes, complains of racing thoughts, auditory and visual hallucinations   REVIEW OF SYSTEMS: Out of a complete 14 system review of symptoms, the patient complains only of the following symptoms, episodic freezing episodes and all other reviewed systems are negative.   ALLERGIES: Allergies  Allergen Reactions   Lithium  Nausea And Vomiting   Diltiazem  Other (See Comments)    Low heart rate, extreme fatigue, pain   Excedrin Extra Strength [Aspirin -Acetaminophen -Caffeine] Other (See Comments)    Makes her nervous.   Geodon [Ziprasidone Hcl]     Neurologist discontinued d/t diagnosis   Klonopin  [Clonazepam ] Other (See  Comments)    hallucinations     HOME MEDICATIONS: Outpatient Medications Prior to Visit  Medication Sig Dispense Refill   acetaminophen  (TYLENOL ) 500 MG tablet Take 1,000 mg by mouth every 8 (eight) hours as needed for moderate pain or headache.     Alcohol Swabs (ALCOHOL  PREP) 70 % PADS SMARTSIG:Pledget(s) Topical Daily     amLODipine  (NORVASC ) 5 MG tablet Take 1 tablet by mouth every evening.     clobetasol cream (TEMOVATE) 0.05 % Apply topically.     famotidine  (PEPCID ) 40 MG tablet Take 40 mg by mouth daily.     furosemide  (LASIX ) 20 MG tablet Take 20 mg by mouth daily.     JARDIANCE 10 MG TABS tablet Take 10 mg by mouth daily.     lamoTRIgine  (LAMICTAL ) 200 MG tablet Take 200 mg by mouth 2 (two) times daily.     LORazepam  (ATIVAN ) 1 MG tablet Take 1 mg by mouth 3 (three) times daily as needed for anxiety.     metoprolol  succinate (TOPROL -XL) 50 MG 24 hr tablet Take 1 tablet (50 mg total) by mouth daily. Take with or immediately following a meal. 30 tablet 1   Multiple Vitamin (MULTIVITAMIN) TABS Take 1 tablet by mouth daily.     nystatin  cream (MYCOSTATIN ) Apply 1 Application topically 2 (two) times daily.     olmesartan (BENICAR) 40 MG tablet SMARTSIG:1.0 Tablet(s) By Mouth Daily     pantoprazole  (PROTONIX ) 40 MG tablet TAKE ONE TABLET BY MOUTH DAILY AT 9AM 30 MIN BEFORE BREAKFAST 90 tablet 3   pravastatin (PRAVACHOL) 20 MG tablet Take 20 mg by mouth at bedtime.     triamcinolone cream (KENALOG) 0.1 % APPLY A THIN LAYER TO THE AFFECTED AREA(S) BY TOPICAL ROUTE 2 TIMES PER DAY     No facility-administered medications prior to visit.     PAST MEDICAL HISTORY: Past Medical History:  Diagnosis Date   Acid reflux    Allergic rhinitis    Anxiety    Arthritis    osteoarthritis   Bipolar disorder (HCC)    Chronic headache    Depression    Diverticula of colon    DM (diabetes mellitus) (HCC)    Gastric erosions    Gastric polyps    benign    Hemorrhoids    Hiatal hernia    Hypertension    IBS (irritable bowel syndrome)    Lichen planus    Obstructive sleep apnea    Panic disorder    Parkinson's disease (HCC)    Rectal cancer (HCC) 2003   ileostomy and reversal   Subdural hematoma (HCC)    Tubular adenoma      PAST SURGICAL  HISTORY: Past Surgical History:  Procedure Laterality Date   ABDOMINAL HYSTERECTOMY     APPENDECTOMY     BACK SURGERY     BIOPSY  10/01/2019   Procedure: BIOPSY;  Surgeon: Shaaron Lamar HERO, MD;  [facility];  Service: Endoscopy;;  gastric   CESAREAN SECTION     X  2   CHOLECYSTECTOMY     COLONOSCOPY  08/2007   DR SHAARON, friable anal canal hemorrhoids, surgical anastomosis at 3cm, distal scattered tics   COLONOSCOPY  12/15/2010   anal papilla and internal hemorrhoids/diminutive polyp in the base of the cecum. Past, tubular adenoma.. Next colonoscopy due in April 2017.   COLONOSCOPY  10/27/2005   RMR: Anal canal hemorrhoids. Surgical anastomosis at 3 cm from the anal verge appeared normal. Few scattered  distal diverticula. The residual  colonic mucosa appeared normal. I suspect the patient bled from hemorrhoids   COLONOSCOPY N/A 01/15/2015   MFM:wnmfjo residual rectum and colon. next tcs 12/2019   COLONOSCOPY WITH PROPOFOL  N/A 05/01/2020   Procedure: COLONOSCOPY WITH PROPOFOL ;  Surgeon: Shaaron Lamar HERO, MD;  Two 3-8 millimeters polyps in ascending colon, diverticulosis in descending colon, surgical anastomosis at 4 cm, otherwise normal exam.  Pathology with tubular adenomas.  Repeat colonoscopy in 5 years.   ESOPHAGEAL DILATION N/A 06/27/2015   Procedure: ESOPHAGEAL DILATION;  Surgeon: Lamar HERO Shaaron, MD;  [facility];  Service: Endoscopy;  Laterality: N/A;   ESOPHAGOGASTRODUODENOSCOPY  05/2002   DR SHAARON, normal   ESOPHAGOGASTRODUODENOSCOPY  08/03/2011   RMR: small HH/ gastric polyps   ESOPHAGOGASTRODUODENOSCOPY N/A 06/27/2015   Dr. Shaaron: Mild erosive reflux esophagitits. Status post passage of a Maloney dilator. Hiatal hernia. Gastric polyps and abnormal gastric mucosa of doubtful clinical significane. status post biopsy, benign fundic gland polyp, negative H.pylori   ESOPHAGOGASTRODUODENOSCOPY (EGD) WITH PROPOFOL  N/A 10/01/2019   rourk: Normal esophagus status post  esophageal dilation for dysphagia, erythematous mucosa in the stomach biopsy consistent with reactive gastropathy, no H. pylori, multiple gastric polyps (biopsy fundic gland)   low anterior rection  2003   RECTAL CANCER   MALONEY DILATION N/A 10/01/2019   Procedure: MALONEY DILATION;  Surgeon: Shaaron Lamar HERO, MD;  [facility];  Service: Endoscopy;  Laterality: N/A;   POLYPECTOMY  05/01/2020   Procedure: POLYPECTOMY;  Surgeon: Shaaron Lamar HERO, MD;  [facility];  Service: Endoscopy;;   SPINE SURGERY  2001   L5,S1 HEMILAMINOTOMY AND DISCECTOMY/DR DEATON     FAMILY HISTORY: Family History  Problem Relation Age of Onset   Diabetes Mother    Hypertension Mother    Hypertension Father    Heart attack Father    Heart failure Father    Colon polyps Brother    Colon cancer Paternal Grandfather 59   Colon polyps Cousin      SOCIAL HISTORY: Social History   Socioeconomic History   Marital status: Divorced    Spouse name: Not on file   Number of children: 2   Years of education: 12   Highest education level: 12th grade  Occupational History   Occupation: Magazine features editor: RETIRED    Comment: [employer] [address]  Tobacco Use   Smoking status: Never    Passive exposure: Never   Smokeless tobacco: Never   Tobacco comments:    Never smoked  Vaping Use   Vaping status: Never Used  Substance and Sexual Activity   Alcohol use: No   Drug use: No   Sexual activity: Not Currently    Partners: Male  Other Topics Concern   Not on file  Social History Narrative   Not on file   Social Drivers of Health   Financial Resource Strain: Medium Risk (09/22/2023)   Overall Financial Resource Strain (CARDIA)    Difficulty of Paying Living Expenses: Somewhat hard  Food Insecurity: No Food Insecurity (04/08/2022)   Hunger Vital Sign    Worried About Running Out of Food in the Last Year: Never true    Ran Out of Food in the Last Year: Never true  Transportation Needs: Unmet  Transportation Needs (04/08/2022)   PRAPARE - Administrator, Civil Service (Medical): Yes    Lack of Transportation (Non-Medical): No  Physical Activity: Inactive (04/08/2022)   Exercise Vital Sign  Days of Exercise per Week: 0 days    Minutes of Exercise per Session: 0 min  Stress: No Stress Concern Present (04/08/2022)   [employer] of Occupational Health - Occupational Stress Questionnaire    Feeling of Stress : Only a little  Social Connections: Moderately Isolated (04/08/2022)   Social Connection and Isolation Panel    Frequency of Communication with Friends and Family: More than three times a week    Frequency of Social Gatherings with Friends and Family: More than three times a week    Attends Religious Services: More than 4 times per year    Active Member of [employer] or Organizations: No    Attends Banker Meetings: Never    Marital Status: Divorced  Catering manager Violence: Not At Risk (04/08/2022)   Humiliation, Afraid, Rape, and Kick questionnaire    Fear of Current or Ex-Partner: No    Emotionally Abused: No    Physically Abused: No    Sexually Abused: No     PHYSICAL EXAM  There were no vitals filed for this visit.  There is no height or weight on file to calculate BMI.  Generalized: Well developed, in no acute distress  Cardiology: normal rate and rhythm, no murmur auscultated  Respiratory: clear to auscultation bilaterally    Neurological examination  Mentation: Alert oriented to time, place, history taking. Follows all commands speech and language fluent Cranial nerve II-XII: Pupils were equal round reactive to light. Extraocular movements were full, visual field were full on confrontational test. Facial sensation and strength were normal. Head turning and shoulder shrug  were normal and symmetric. Motor: The motor testing reveals 5 over 5 strength of all 4 extremities. Good symmetric motor tone is noted throughout.  Gait and  station: Gait is normal.    DIAGNOSTIC DATA (LABS, IMAGING, TESTING) - I reviewed patient records, labs, notes, testing and imaging myself where available.  Lab Results  Component Value Date   WBC 3.7 01/31/2024   HGB 14.8 01/31/2024   HCT 46.5 01/31/2024   MCV 82 01/31/2024   PLT 325 01/31/2024      Component Value Date/Time   NA 141 01/31/2024 0848   K 4.0 01/31/2024 0848   K 4.6 06/26/2010 1400   CL 103 01/31/2024 0848   CO2 21 01/31/2024 0848   GLUCOSE 97 01/31/2024 0848   GLUCOSE 111 (H) 08/07/2023 0205   BUN 14 01/31/2024 0848   CREATININE 1.15 (H) 01/31/2024 0848   CREATININE 1.21 (H) 08/11/2020 1400   CALCIUM  9.5 01/31/2024 0848   PROT 7.1 01/31/2024 0848   ALBUMIN 4.6 01/31/2024 0848   AST 22 01/31/2024 0848   AST 17 06/26/2010 1400   ALT 18 01/31/2024 0848   ALKPHOS 137 (H) 01/31/2024 0848   ALKPHOS 92 06/26/2010 1400   BILITOT 0.2 01/31/2024 0848   BILITOT 0.3 06/26/2010 1400   GFRNONAA 48 (L) 08/07/2023 0205   GFRNONAA 48 (L) 08/11/2020 1400   GFRAA 55 (L) 08/11/2020 1400   GFRAA 55 (L) 08/11/2020 1315   Lab Results  Component Value Date   CHOL 156 10/06/2023   HDL 68 10/06/2023   LDLCALC 74 10/06/2023   TRIG 75 10/06/2023   CHOLHDL 2.3 10/06/2023   Lab Results  Component Value Date   HGBA1C 6.2 10/11/2023   Lab Results  Component Value Date   VITAMINB12 432 01/18/2020   Lab Results  Component Value Date   TSH 2.160 10/06/2023       08/17/2023  9:39 AM  MMSE - Mini Mental State Exam  Orientation to time 5  Orientation to Place 5  Registration 3  Attention/ Calculation 2  Recall 3  Language- name 2 objects 2  Language- repeat 1  Language- follow 3 step command 3  Language- read & follow direction 1  Write a sentence 1  Copy design 0  Total score 26        08/16/2022   11:11 AM  Montreal Cognitive Assessment   Visuospatial/ Executive (0/5) 2  Naming (0/3) 3  Attention: Read list of digits (0/2) 1  Attention: Read list  of letters (0/1) 1  Attention: Serial 7 subtraction starting at 100 (0/3) 1  Language: Repeat phrase (0/2) 2  Language : Fluency (0/1) 1  Abstraction (0/2) 2  Delayed Recall (0/5) 5  Orientation (0/6) 6  Total 24  Adjusted Score (based on education) 24     ASSESSMENT AND PLAN  68 y.o. year old female  has a past medical history of Acid reflux, Allergic rhinitis, Anxiety, Arthritis, Bipolar disorder (HCC), Chronic headache, Depression, Diverticula of colon, DM (diabetes mellitus) (HCC), Gastric erosions, Gastric polyps, Hemorrhoids, Hiatal hernia, Hypertension, IBS (irritable bowel syndrome), Lichen planus, Obstructive sleep apnea, Panic disorder, Parkinson's disease (HCC), Rectal cancer (HCC) (2003), Subdural hematoma (HCC), and Tubular adenoma. here with    No diagnosis found.  Linda English denies confusion episodes but reports an episode of freezing last week where she was suddenly unable to walk. 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 No consistent with seizure like activity. I have encouraged her to continue close follow up with psychiatry and call PCP asap to discuss BP management plan. I have encouraged her to take medications as prescribed. Fortunately, she is asymptomatic, today. Healthy lifestyle habits encouraged. She will follow up with PCP as directed. She will return to see me as needed. She verbalizes understanding and agreement with this plan.   No orders of the defined types were placed in this encounter.    No orders of the defined types were placed in this encounter.   I spent 30 minutes of face-to-face and non-face-to-face time with patient.  This included previsit chart review, lab review, study review, order entry, electronic health record documentation, patient education.   Greig Forbes, MSN, FNP-C 03/04/2024, 7:09 PM  [facility] [address], [address] [place], [place]  [address] [phone]

## 2024-03-05 ENCOUNTER — Ambulatory Visit (INDEPENDENT_AMBULATORY_CARE_PROVIDER_SITE_OTHER): Payer: Medicare HMO | Admitting: Family Medicine

## 2024-03-05 ENCOUNTER — Encounter: Payer: Self-pay | Admitting: Family Medicine

## 2024-03-05 VITALS — BP 161/93 | HR 79 | Ht 61.0 in | Wt 142.0 lb

## 2024-03-05 DIAGNOSIS — G8929 Other chronic pain: Secondary | ICD-10-CM

## 2024-03-05 DIAGNOSIS — F319 Bipolar disorder, unspecified: Secondary | ICD-10-CM

## 2024-03-05 DIAGNOSIS — R202 Paresthesia of skin: Secondary | ICD-10-CM

## 2024-03-05 DIAGNOSIS — R55 Syncope and collapse: Secondary | ICD-10-CM

## 2024-03-05 DIAGNOSIS — R42 Dizziness and giddiness: Secondary | ICD-10-CM | POA: Diagnosis not present

## 2024-03-05 DIAGNOSIS — M549 Dorsalgia, unspecified: Secondary | ICD-10-CM

## 2024-03-06 ENCOUNTER — Other Ambulatory Visit (HOSPITAL_COMMUNITY)
Admission: RE | Admit: 2024-03-06 | Discharge: 2024-03-06 | Disposition: A | Discharge status: Home / Self Care | Source: Ambulatory Visit | Attending: Internal Medicine | Admitting: Internal Medicine

## 2024-03-06 ENCOUNTER — Telehealth: Payer: Self-pay

## 2024-03-06 DIAGNOSIS — R194 Change in bowel habit: Secondary | ICD-10-CM | POA: Diagnosis not present

## 2024-03-06 DIAGNOSIS — C20 Malignant neoplasm of rectum: Secondary | ICD-10-CM | POA: Insufficient documentation

## 2024-03-06 LAB — BASIC METABOLIC PANEL WITH GFR
Anion gap: 9 (ref 5–15)
BUN: 23 mg/dL (ref 8–23)
CO2: 25 mmol/L (ref 22–32)
Calcium: 9.1 mg/dL (ref 8.9–10.3)
Chloride: 105 mmol/L (ref 98–111)
Creatinine, Ser: 1.53 mg/dL — ABNORMAL HIGH (ref 0.44–1.00)
GFR, Estimated: 37 mL/min — ABNORMAL LOW (ref >60)
Glucose, Bld: 102 mg/dL — ABNORMAL HIGH (ref 70–99)
Potassium: 3.9 mmol/L (ref 3.5–5.1)
Sodium: 139 mmol/L (ref 135–145)

## 2024-03-06 NOTE — Telephone Encounter (Signed)
 Pt called stating that her stool is light green in color and is wondering if she should be concerned. Pt states that she has not ate anything or taken any medications that should have caused it. Pt is scheduled for a colonoscopy on 03/12/2024.

## 2024-03-07 NOTE — Telephone Encounter (Signed)
 No concerns regarding light green stool. Has she had any other changes, when I saw her recently she had alternating solid/loose stools, some days no stools. If no significant change with this, then proceed with colonoscopy as scheduled.

## 2024-03-07 NOTE — Telephone Encounter (Signed)
 Pt was made aware and stated that there has been no change in her stools since her ov. Pt was instructed to proceed with colonoscopy as planned.

## 2024-03-12 ENCOUNTER — Encounter (HOSPITAL_COMMUNITY): Payer: Self-pay | Admitting: Internal Medicine

## 2024-03-12 ENCOUNTER — Ambulatory Visit (HOSPITAL_COMMUNITY): Admitting: Certified Registered"

## 2024-03-12 ENCOUNTER — Encounter (HOSPITAL_COMMUNITY)
Admission: RE | Disposition: A | Discharge status: Home / Self Care | Payer: Self-pay | Source: Home / Self Care | Attending: Internal Medicine

## 2024-03-12 ENCOUNTER — Ambulatory Visit (HOSPITAL_COMMUNITY)
Admission: RE | Admit: 2024-03-12 | Discharge: 2024-03-12 | Disposition: A | Discharge status: Home / Self Care | Attending: Internal Medicine | Admitting: Internal Medicine

## 2024-03-12 ENCOUNTER — Other Ambulatory Visit: Payer: Self-pay

## 2024-03-12 DIAGNOSIS — G4733 Obstructive sleep apnea (adult) (pediatric): Secondary | ICD-10-CM | POA: Insufficient documentation

## 2024-03-12 DIAGNOSIS — K5730 Diverticulosis of large intestine without perforation or abscess without bleeding: Secondary | ICD-10-CM

## 2024-03-12 DIAGNOSIS — Z5982 Transportation insecurity: Secondary | ICD-10-CM | POA: Diagnosis not present

## 2024-03-12 DIAGNOSIS — Z85048 Personal history of other malignant neoplasm of rectum, rectosigmoid junction, and anus: Secondary | ICD-10-CM | POA: Insufficient documentation

## 2024-03-12 DIAGNOSIS — L299 Pruritus, unspecified: Secondary | ICD-10-CM | POA: Insufficient documentation

## 2024-03-12 DIAGNOSIS — Z5986 Financial insecurity: Secondary | ICD-10-CM | POA: Diagnosis not present

## 2024-03-12 DIAGNOSIS — I509 Heart failure, unspecified: Secondary | ICD-10-CM | POA: Insufficient documentation

## 2024-03-12 DIAGNOSIS — F419 Anxiety disorder, unspecified: Secondary | ICD-10-CM | POA: Diagnosis not present

## 2024-03-12 DIAGNOSIS — K581 Irritable bowel syndrome with constipation: Secondary | ICD-10-CM | POA: Diagnosis not present

## 2024-03-12 DIAGNOSIS — G20A1 Parkinson's disease without dyskinesia, without mention of fluctuations: Secondary | ICD-10-CM | POA: Diagnosis not present

## 2024-03-12 DIAGNOSIS — F319 Bipolar disorder, unspecified: Secondary | ICD-10-CM | POA: Diagnosis not present

## 2024-03-12 DIAGNOSIS — E119 Type 2 diabetes mellitus without complications: Secondary | ICD-10-CM | POA: Diagnosis not present

## 2024-03-12 DIAGNOSIS — K642 Third degree hemorrhoids: Secondary | ICD-10-CM | POA: Insufficient documentation

## 2024-03-12 DIAGNOSIS — R194 Change in bowel habit: Secondary | ICD-10-CM

## 2024-03-12 DIAGNOSIS — D124 Benign neoplasm of descending colon: Secondary | ICD-10-CM

## 2024-03-12 DIAGNOSIS — K219 Gastro-esophageal reflux disease without esophagitis: Secondary | ICD-10-CM | POA: Diagnosis not present

## 2024-03-12 DIAGNOSIS — D122 Benign neoplasm of ascending colon: Secondary | ICD-10-CM | POA: Diagnosis not present

## 2024-03-12 DIAGNOSIS — I110 Hypertensive heart disease with heart failure: Secondary | ICD-10-CM | POA: Insufficient documentation

## 2024-03-12 DIAGNOSIS — G4730 Sleep apnea, unspecified: Secondary | ICD-10-CM | POA: Diagnosis not present

## 2024-03-12 DIAGNOSIS — K635 Polyp of colon: Secondary | ICD-10-CM | POA: Diagnosis not present

## 2024-03-12 DIAGNOSIS — Z7984 Long term (current) use of oral hypoglycemic drugs: Secondary | ICD-10-CM | POA: Insufficient documentation

## 2024-03-12 HISTORY — PX: COLONOSCOPY: SHX5424

## 2024-03-12 LAB — GLUCOSE, CAPILLARY: Glucose-Capillary: 80 mg/dL (ref 70–99)

## 2024-03-12 SURGERY — COLONOSCOPY
Anesthesia: General

## 2024-03-12 MED ORDER — LACTATED RINGERS IV SOLN: INTRAVENOUS | Status: DC | PRN

## 2024-03-12 MED ORDER — PROPOFOL 500 MG/50ML IV EMUL
INTRAVENOUS | Status: DC | PRN
  Administered 2024-03-12: 100 mg via INTRAVENOUS
  Administered 2024-03-12: 150 ug/kg/min via INTRAVENOUS

## 2024-03-12 MED ORDER — SODIUM CHLORIDE 0.9% FLUSH: 3.0000 mL | INTRAVENOUS | Status: DC | PRN

## 2024-03-12 MED ORDER — LACTATED RINGERS IV SOLN: INTRAVENOUS | Status: DC

## 2024-03-12 MED ORDER — SODIUM CHLORIDE 0.9% FLUSH: 3.0000 mL | Freq: Two times a day (BID) | INTRAVENOUS | Status: DC

## 2024-03-12 NOTE — Anesthesia Preprocedure Evaluation (Addendum)
 Anesthesia Evaluation  Patient identified by MRN, date of birth, ID band Patient awake    Reviewed: Allergy & Precautions, NPO status , Patient's Chart, lab work & pertinent test results  History of Anesthesia Complications Negative for: history of anesthetic complications  Airway Mallampati: II  TM Distance: >3 FB Neck ROM: Full    Dental  (+) Teeth Intact, Dental Advisory Given, Chipped,    Pulmonary shortness of breath and with exertion, sleep apnea    Pulmonary exam normal breath sounds clear to auscultation       Cardiovascular Exercise Tolerance: Good hypertension, Pt. on medications +CHF  Normal cardiovascular exam Rhythm:Regular Rate:Normal + Diastolic murmurs 1. Left ventricular ejection fraction, by estimation, is 55 to 60%. The  left ventricle has normal function. Left ventricular endocardial border not optimally defined to evaluate regional wall motion. Left ventricular diastolic parameters are indeterminate.  2. Right ventricular systolic function is normal. The right ventricular size is normal. There is normal pulmonary artery systolic pressure.  3. The mitral valve is normal in structure and function. Mild mitral valve regurgitation. No evidence of mitral stenosis.  4. The aortic valve is tricuspid. Aortic valve regurgitation is moderate   Neuro/Psych  Headaches PSYCHIATRIC DISORDERS Anxiety Depression Bipolar Disorder   Parkinsons disease  Neuromuscular disease    GI/Hepatic hiatal hernia, PUD,GERD  Medicated,,  Endo/Other  diabetes, Well Controlled, Type 2, Oral Hypoglycemic Agents    Renal/GU Renal InsufficiencyRenal disease     Musculoskeletal  (+) Arthritis , Osteoarthritis,    Abdominal   Peds  Hematology   Anesthesia Other Findings   Reproductive/Obstetrics                              Anesthesia Physical Anesthesia Plan  ASA: 3  Anesthesia Plan: General    Post-op Pain Management:    Induction: Intravenous  PONV Risk Score and Plan: Propofol  infusion  Airway Management Planned: Nasal Cannula and Natural Airway  Additional Equipment: None  Intra-op Plan:   Post-operative Plan:   Informed Consent: I have reviewed the patients History and Physical, chart, labs and discussed the procedure including the risks, benefits and alternatives for the proposed anesthesia with the patient or authorized representative who has indicated his/her understanding and acceptance.     Dental advisory given  Plan Discussed with: CRNA  Anesthesia Plan Comments:          Anesthesia Quick Evaluation

## 2024-03-12 NOTE — Op Note (Signed)
 [facility] Patient Name: Linda English Procedure Date: 03/12/2024 8:37 AM MRN: [id number] Date of Birth: 1956/04/23 Attending MD: Lamar Ozell Hollingshead , MD, [id number] CSN: [id number] Age: 68 Admit Type: Outpatient Procedure:                Colonoscopy Indications:               Providers:                Lamar Ozell Hollingshead, MD, Leandrew Edelman RN, RN,                            Daphne Mulch Technician, Technician Referring MD:              Medicines:                Propofol  per Anesthesia Complications:            No immediate complications. Estimated Blood Loss:     Estimated blood loss was minimal. Procedure:                Pre-Anesthesia Assessment:                           - Prior to the procedure, a History and Physical                            was performed, and patient medications and                            allergies were reviewed. The patient's tolerance of                            previous anesthesia was also reviewed. The risks                            and benefits of the procedure and the sedation                            options and risks were discussed with the patient.                            All questions were answered, and informed consent                            was obtained. Prior Anticoagulants: The patient has                            taken no anticoagulant or antiplatelet agents. ASA                            Grade Assessment: II - A patient with mild systemic                            disease. After reviewing the risks and benefits,  the patient was deemed in satisfactory condition to                            undergo the procedure.                           After obtaining informed consent, the colonoscope                            was passed under direct vision. Throughout the                            procedure, the patient's blood pressure, pulse, and                            oxygen saturations  were monitored continuously. The                            [id number]) scope was introduced through the                            anus and advanced to the the cecum, identified by                            appendiceal orifice and ileocecal valve. The entire                            colon was well visualized. The colonoscopy was                            performed without difficulty. The patient tolerated                            the procedure well. The quality of the bowel                            preparation was adequate. Scope In: 9:06:54 AM Scope Out: 9:20:59 AM Scope Withdrawal Time: 0 hours 7 minutes 5 seconds  Total Procedure Duration: 0 hours 14 minutes 5 seconds  Findings:      The perianal and digital rectal examinations were normal.      Surgical anastomosis at 4 cm from anal verge.      Two sessile polyps were found in the descending colon and ascending       colon. The polyps were 5 to 6 mm in size. These polyps were removed with       a cold snare. Resection and retrieval were complete. Estimated blood       loss was minimal.      Non-bleeding internal hemorrhoids were found during retroflexion. The       hemorrhoids were Grade III (internal hemorrhoids that prolapse but       require manual reduction).      A few small-mouthed diverticula were found in the entire colon.      The exam was otherwise without abnormality on direct and retroflexion       views. Impression:               -  Two 5 to 6 mm polyps in the descending colon and                            in the ascending colon, removed with a cold snare.                            Resected and retrieved.                           - Non-bleeding internal hemorrhoids.                           - Diverticulosis in the entire examined colon.                           - The examination was otherwise normal on direct                            and retroflexion views. Surgical anastomosis at 4                             cm. Patient appears to be a reasonably good                            hemorrhoid banding candidate. Moderate Sedation:      Moderate (conscious) sedation was personally administered by an       anesthesia professional. The following parameters were monitored: oxygen       saturation, heart rate, blood pressure, respiratory rate, EKG, adequacy       of pulmonary ventilation, and response to care. Recommendation:           - Patient has a contact number available for                            emergencies. The signs and symptoms of potential                            delayed complications were discussed with the                            patient. Return to normal activities tomorrow.                            Written discharge instructions were provided to the                            patient.                           - Advance diet as tolerated.                           - Continue present medications. Hemorrhoid banding  pamphlet provided                           - Repeat colonoscopy date to be determined after                            pending pathology results are reviewed for                            surveillance.                           - Return to GI office in 6 weeks for hemorrhoid                            banding.. Procedure Code(s):        --- Professional ---                           [phone], Colonoscopy, flexible; with removal of                            tumor(s), polyp(s), or other lesion(s) by snare                            technique Diagnosis Code(s):        --- Professional ---                           D12.4, Benign neoplasm of descending colon                           D12.2, Benign neoplasm of ascending colon                           K64.2, Third degree hemorrhoids                           K57.30, Diverticulosis of large intestine without                            perforation or abscess without bleeding CPT  copyright 2022 [employer]. All rights reserved. The codes documented in this report are preliminary and upon coder review may  be revised to meet current compliance requirements. Lamar HERO. Cortnie Ringel, MD Lamar Ozell Hollingshead, MD 03/12/2024 9:37:07 AM This report has been signed electronically. Number of Addenda: 0

## 2024-03-12 NOTE — Anesthesia Postprocedure Evaluation (Signed)
 Anesthesia Post Note  Patient: Linda English  Procedure(s) Performed: COLONOSCOPY  Patient location during evaluation: Endoscopy Anesthesia Type: General Level of consciousness: awake and alert Pain management: pain level controlled Vital Signs Assessment: post-procedure vital signs reviewed and stable Respiratory status: spontaneous breathing, nonlabored ventilation and respiratory function stable Cardiovascular status: blood pressure returned to baseline and stable Postop Assessment: no apparent nausea or vomiting Anesthetic complications: no   There were no known notable events for this encounter.   Last Vitals:  Vitals:   03/12/24 0749 03/12/24 0925  BP: (!) 147/71 110/65  Pulse: 75 78  Resp: (!) 21 18  Temp: 36.8 C 36.6 C  SpO2: 99% 99%    Last Pain:  Vitals:   03/12/24 0925  TempSrc: Oral  PainSc: 0-No pain                 Zineb Glade L Zarya Lasseigne

## 2024-03-12 NOTE — Discharge Instructions (Addendum)
  Colonoscopy Discharge Instructions  Read the instructions outlined below and refer to this sheet in the next few weeks. These discharge instructions provide you with general information on caring for yourself after you leave the hospital. Your doctor may also give you specific instructions. While your treatment has been planned according to the most current medical practices available, unavoidable complications occasionally occur. If you have any problems or questions after discharge, call Dr. Shaaron at [phone]. ACTIVITY You may resume your regular activity, but move at a slower pace for the next 24 hours.  Take frequent rest periods for the next 24 hours.  Walking will help get rid of the air and reduce the bloated feeling in your belly (abdomen).  No driving for 24 hours (because of the medicine (anesthesia) used during the test).   Do not sign any important legal documents or operate any machinery for 24 hours (because of the anesthesia used during the test).  NUTRITION Drink plenty of fluids.  You may resume your normal diet as instructed by your doctor.  Begin with a light meal and progress to your normal diet. Heavy or fried foods are harder to digest and may make you feel sick to your stomach (nauseated).  Avoid alcoholic beverages for 24 hours or as instructed.  MEDICATIONS You may resume your normal medications unless your doctor tells you otherwise.  WHAT YOU CAN EXPECT TODAY Some feelings of bloating in the abdomen.  Passage of more gas than usual.  Spotting of blood in your stool or on the toilet paper.  IF YOU HAD POLYPS REMOVED DURING THE COLONOSCOPY: No aspirin  products for 7 days or as instructed.  No alcohol for 7 days or as instructed.  Eat a soft diet for the next 24 hours.  FINDING OUT THE RESULTS OF YOUR TEST Not all test results are available during your visit. If your test results are not back during the visit, make an appointment with your caregiver to find out the  results. Do not assume everything is normal if you have not heard from your caregiver or the medical facility. It is important for you to follow up on all of your test results.  SEEK IMMEDIATE MEDICAL ATTENTION IF: You have more than a spotting of blood in your stool.  Your belly is swollen (abdominal distention).  You are nauseated or vomiting.  You have a temperature over 101.  You have abdominal pain or discomfort that is severe or gets worse throughout the day.     2 small polyps removed from your colon today.  You have protruding hemorrhoids which   Would benefit from hemorrhoid banding.   further recommendations to follow pending review of pathology report   pamphlet on hemorrhoid banding provided  Office visit with Therisa Stager for hemorrhoid banding in 6 weeks

## 2024-03-12 NOTE — Transfer of Care (Signed)
 Immediate Anesthesia Transfer of Care Note  Patient: Linda English  Procedure(s) Performed: COLONOSCOPY  Patient Location: PACU and Short Stay  Anesthesia Type:General  Level of Consciousness: awake, alert , and oriented  Airway & Oxygen Therapy: Patient Spontanous Breathing  Post-op Assessment: Report given to RN and Post -op Vital signs reviewed and stable  Post vital signs: Reviewed and stable  Last Vitals:  Vitals Value Taken Time  BP 110/65 03/12/24 09:25  Temp 36.6 C 03/12/24 09:25  Pulse 78 03/12/24 09:25  Resp 18 03/12/24 09:25  SpO2 99 % 03/12/24 09:25    Last Pain:  Vitals:   03/12/24 0925  TempSrc: Oral  PainSc: 0-No pain      Patients Stated Pain Goal: 2 (03/12/24 0749)  Complications: No notable events documented.

## 2024-03-12 NOTE — Interval H&P Note (Signed)
 History and Physical Interval Note:  03/12/2024 8:54 AM  Linda English  has presented today for surgery, with the diagnosis of change in bowels,history of rectal cancer.  The various methods of treatment have been discussed with the patient and family. After consideration of risks, benefits and other options for treatment, the patient has consented to  Procedure(s) with comments: COLONOSCOPY (N/A) - 9:00 am, ok rm 1/2 as a surgical intervention.  The patient's history has been reviewed, patient examined, no change in status, stable for surgery.  I have reviewed the patient's chart and labs.  Questions were answered to the patient's satisfaction.     [name]   no change.  Protruding area in her rectum.  Diagnostic colonoscopy today per plan.  The risks, benefits, limitations, alternatives and imponderables have been reviewed with the patient. Questions have been answered. All parties are agreeable.

## 2024-03-13 ENCOUNTER — Encounter (HOSPITAL_COMMUNITY): Payer: Self-pay | Admitting: Internal Medicine

## 2024-03-13 LAB — SURGICAL PATHOLOGY

## 2024-03-14 ENCOUNTER — Ambulatory Visit: Payer: Self-pay | Admitting: Internal Medicine

## 2024-04-09 ENCOUNTER — Encounter: Payer: Self-pay | Admitting: "Endocrinology

## 2024-04-09 ENCOUNTER — Ambulatory Visit (INDEPENDENT_AMBULATORY_CARE_PROVIDER_SITE_OTHER): Payer: Medicare HMO | Admitting: "Endocrinology

## 2024-04-09 VITALS — BP 124/86 | HR 64 | Ht 61.0 in | Wt 140.6 lb

## 2024-04-09 DIAGNOSIS — E782 Mixed hyperlipidemia: Secondary | ICD-10-CM | POA: Diagnosis not present

## 2024-04-09 DIAGNOSIS — E1122 Type 2 diabetes mellitus with diabetic chronic kidney disease: Secondary | ICD-10-CM | POA: Diagnosis not present

## 2024-04-09 DIAGNOSIS — Z7984 Long term (current) use of oral hypoglycemic drugs: Secondary | ICD-10-CM

## 2024-04-09 DIAGNOSIS — I10 Essential (primary) hypertension: Secondary | ICD-10-CM | POA: Diagnosis not present

## 2024-04-09 DIAGNOSIS — N182 Chronic kidney disease, stage 2 (mild): Secondary | ICD-10-CM | POA: Diagnosis not present

## 2024-04-09 DIAGNOSIS — N183 Chronic kidney disease, stage 3 unspecified: Secondary | ICD-10-CM

## 2024-04-09 LAB — POCT GLYCOSYLATED HEMOGLOBIN (HGB A1C): HbA1c, POC (controlled diabetic range): 6.2 % (ref 0.0–7.0)

## 2024-04-09 NOTE — Progress Notes (Signed)
 04/09/2024, 2:45 PM   Endocrinology follow-up note  Subjective:    Patient ID: Linda English, female    DOB: 1955/09/17, PCP Sheryle Carwin, MD   Past Medical History:  Diagnosis Date   Acid reflux    Allergic rhinitis    Anxiety    Arthritis    osteoarthritis   Bipolar disorder (HCC)    Chronic headache    Depression    Diverticula of colon    DM (diabetes mellitus) (HCC)    Gastric erosions    Gastric polyps    benign    Hemorrhoids    Hiatal hernia    Hypertension    IBS (irritable bowel syndrome)    Lichen planus    Obstructive sleep apnea    Panic disorder    Parkinson's disease (HCC)    Rectal cancer (HCC) 2003   ileostomy and reversal   Subdural hematoma (HCC)    Tubular adenoma    Past Surgical History:  Procedure Laterality Date   ABDOMINAL HYSTERECTOMY     APPENDECTOMY     BACK SURGERY     BIOPSY  10/01/2019   Procedure: BIOPSY;  Surgeon: Shaaron Lamar HERO, MD;  [facility];  Service: Endoscopy;;  gastric   CESAREAN SECTION     X  2   CHOLECYSTECTOMY     COLONOSCOPY  08/2007   DR SHAARON, friable anal canal hemorrhoids, surgical anastomosis at 3cm, distal scattered tics   COLONOSCOPY  12/15/2010   anal papilla and internal hemorrhoids/diminutive polyp in the base of the cecum. Past, tubular adenoma.. Next colonoscopy due in April 2017.   COLONOSCOPY  10/27/2005   RMR: Anal canal hemorrhoids. Surgical anastomosis at 3 cm from the anal verge appeared normal. Few scattered distal diverticula. The residual  colonic mucosa appeared normal. I suspect the patient bled from hemorrhoids   COLONOSCOPY N/A 01/15/2015   MFM:[id number] residual rectum and colon. next tcs 12/2019   COLONOSCOPY N/A 03/12/2024   Procedure: COLONOSCOPY;  Surgeon: Shaaron Lamar HERO, MD;  [facility]: [facility];  Service: Endoscopy;  Laterality: N/A;  9:00 am, ok rm 1/2   COLONOSCOPY WITH PROPOFOL  N/A 05/01/2020   Procedure:  COLONOSCOPY WITH PROPOFOL ;  Surgeon: Shaaron Lamar HERO, MD;  Two 3-8 millimeters polyps in ascending colon, diverticulosis in descending colon, surgical anastomosis at 4 cm, otherwise normal exam.  Pathology with tubular adenomas.  Repeat colonoscopy in 5 years.   ESOPHAGEAL DILATION N/A 06/27/2015   Procedure: ESOPHAGEAL DILATION;  Surgeon: Lamar HERO Shaaron, MD;  [facility];  Service: Endoscopy;  Laterality: N/A;   ESOPHAGOGASTRODUODENOSCOPY  05/2002   DR SHAARON, normal   ESOPHAGOGASTRODUODENOSCOPY  08/03/2011   RMR: small HH/ gastric polyps   ESOPHAGOGASTRODUODENOSCOPY N/A 06/27/2015   Dr. Shaaron: Mild erosive reflux esophagitits. Status post passage of a Maloney dilator. Hiatal hernia. Gastric polyps and abnormal gastric mucosa of doubtful clinical significane. status post biopsy, benign fundic gland polyp, negative H.pylori   ESOPHAGOGASTRODUODENOSCOPY (EGD) WITH PROPOFOL  N/A 10/01/2019   rourk: Normal esophagus status post esophageal dilation for dysphagia, erythematous mucosa in the stomach biopsy consistent with reactive gastropathy, no H. pylori, multiple gastric polyps (biopsy fundic gland)   low anterior rection  2003   RECTAL CANCER   MALONEY DILATION N/A 10/01/2019   Procedure: AGAPITO DILATION;  Surgeon: Shaaron Lamar HERO, MD;  [facility];  Service: Endoscopy;  Laterality: N/A;   POLYPECTOMY  05/01/2020   Procedure: POLYPECTOMY;  Surgeon: Shaaron Lamar HERO, MD;  [facility];  Service: Endoscopy;;   SPINE SURGERY  2001   L5,S1 HEMILAMINOTOMY AND DISCECTOMY/DR DEATON   Social History   Socioeconomic History   Marital status: Divorced    Spouse name: Not on file   Number of children: 2   Years of education: 12   Highest education level: 12th grade  Occupational History   Occupation: Magazine features editor: RETIRED    Comment: [employer]  Tobacco Use   Smoking status: Never    Passive exposure: Never   Smokeless tobacco: Never   Tobacco comments:     Never smoked  Vaping Use   Vaping status: Never Used  Substance and Sexual Activity   Alcohol use: No   Drug use: No   Sexual activity: Not Currently    Partners: Male  Other Topics Concern   Not on file  Social History Narrative   Not on file   Social Drivers of Health   Financial Resource Strain: Medium Risk (09/22/2023)   Overall Financial Resource Strain (CARDIA)    Difficulty of Paying Living Expenses: Somewhat hard  Food Insecurity: No Food Insecurity (04/08/2022)   Hunger Vital Sign    Worried About Running Out of Food in the Last Year: Never true    Ran Out of Food in the Last Year: Never true  Transportation Needs: Unmet Transportation Needs (04/08/2022)   PRAPARE - Administrator, Civil Service (Medical): Yes    Lack of Transportation (Non-Medical): No  Physical Activity: Inactive (04/08/2022)   Exercise Vital Sign    Days of Exercise per Week: 0 days    Minutes of Exercise per Session: 0 min  Stress: No Stress Concern Present (04/08/2022)   [employer] of Occupational Health - Occupational Stress Questionnaire    Feeling of Stress : Only a little  Social Connections: Moderately Isolated (04/08/2022)   Social Connection and Isolation Panel    Frequency of Communication with Friends and Family: More than three times a week    Frequency of Social Gatherings with Friends and Family: More than three times a week    Attends Religious Services: More than 4 times per year    Active Member of [employer] or Organizations: No    Attends Banker Meetings: Never    Marital Status: Divorced   Family History  Problem Relation Age of Onset   Diabetes Mother    Hypertension Mother    Hypertension Father    Heart attack Father    Heart failure Father    Colon polyps Brother    Colon cancer Paternal Grandfather 39   Colon polyps Cousin    Outpatient Encounter Medications as of 04/09/2024  Medication Sig   acetaminophen  (TYLENOL ) 500 MG tablet Take  1,000 mg by mouth every 8 (eight) hours as needed for moderate pain or headache.   Alcohol Swabs (ALCOHOL PREP) 70 % PADS SMARTSIG:Pledget(s) Topical Daily   amLODipine  (NORVASC ) 5 MG tablet Take 1 tablet by mouth every evening.   clobetasol cream (TEMOVATE) 0.05 % Apply topically.   famotidine  (PEPCID ) 40 MG tablet Take 40 mg by mouth daily.   furosemide  (LASIX ) 20 MG tablet Take 20 mg by mouth  daily.   JARDIANCE 10 MG TABS tablet Take 10 mg by mouth daily.   lamoTRIgine  (LAMICTAL ) 200 MG tablet Take 200 mg by mouth 2 (two) times daily.   LORazepam  (ATIVAN ) 1 MG tablet Take 1 mg by mouth 3 (three) times daily as needed for anxiety.   metoprolol  succinate (TOPROL -XL) 50 MG 24 hr tablet Take 1 tablet (50 mg total) by mouth daily. Take with or immediately following a meal.   Multiple Vitamin (MULTIVITAMIN) TABS Take 1 tablet by mouth daily.   nystatin  cream (MYCOSTATIN ) Apply 1 Application topically 2 (two) times daily.   olmesartan (BENICAR) 40 MG tablet SMARTSIG:1.0 Tablet(s) By Mouth Daily   pantoprazole  (PROTONIX ) 40 MG tablet TAKE ONE TABLET BY MOUTH DAILY AT 9AM 30 MIN BEFORE BREAKFAST   pravastatin (PRAVACHOL) 20 MG tablet Take 20 mg by mouth at bedtime.   triamcinolone cream (KENALOG) 0.1 % APPLY A THIN LAYER TO THE AFFECTED AREA(S) BY TOPICAL ROUTE 2 TIMES PER DAY   No facility-administered encounter medications on file as of 04/09/2024.   ALLERGIES: Allergies  Allergen Reactions   Lithium  Nausea And Vomiting   Diltiazem  Other (See Comments)    Low heart rate, extreme fatigue, pain   Excedrin Extra Strength [Aspirin -Acetaminophen -Caffeine] Other (See Comments)    Makes her nervous.   Geodon [Ziprasidone Hcl]     Neurologist discontinued d/t diagnosis   Klonopin  [Clonazepam ] Other (See Comments)    hallucinations    VACCINATION STATUS: Immunization History  Administered Date(s) Administered   Influenza,inj,Quad PF,6+ Mos 06/25/2019   Pneumococcal Polysaccharide-23 08/13/2019     HPI Aissata H Capps is 68 y.o. female who presents today with a medical history as above. she is being seen in follow-up after she was seen in consultation for controlled type 2 diabetes.  This patient was kept on low-dose SGLT2 inhibitors to treat type 2 diabetes complicated by mild CKD.   She is currently on Jardiance 10 mg p.o. daily at breakfast.  She does not monitor blood glucose regularly, point-of-care A1c was 6.2% today.     She denies any hypoglycemia.     She has stage 2-3 CKD which has been stable and improving over several years.  Uncontrolled blood pressure currently on multiple medications including losartan 50 mg p.o. daily, Lasix  20 mg p.o. daily, metoprolol  50 mg p.o. daily.  Recently amlodipine  was added to this regimen.   Review of Systems  Constitutional: no recent weight gain/loss, no fatigue, no subjective hyperthermia, no subjective hypothermia Eyes: no blurry vision, no xerophthalmia   Objective:       04/09/2024   10:55 AM 03/12/2024    9:25 AM 03/12/2024    7:49 AM  Vitals with BMI  Height 5' 1    Weight 140 lbs 10 oz    BMI 26.58    Systolic 124 110 852  Diastolic 86 65 71  Pulse 64 78 75    BP 124/86   Pulse 64   Ht 5' 1 (1.549 m)   Wt 140 lb 9.6 oz (63.8 kg)   BMI 26.57 kg/m   Wt Readings from Last 3 Encounters:  04/09/24 140 lb 9.6 oz (63.8 kg)  03/05/24 142 lb (64.4 kg)  02/15/24 141 lb 6.4 oz (64.1 kg)    Physical Exam  Constitutional:  Body mass index is 26.57 kg/m.,  not in acute distress, normal state of mind Eyes: PERRLA, EOMI, no exophthalmos ENT: moist mucous membranes, no gross thyromegaly, no gross cervical lymphadenopathy   CMP (  most recent) CMP     Component Value Date/Time   NA 139 03/06/2024 0852   NA 141 01/31/2024 0848   K 3.9 03/06/2024 0852   K 4.6 06/26/2010 1400   CL 105 03/06/2024 0852   CO2 25 03/06/2024 0852   GLUCOSE 102 (H) 03/06/2024 0852   BUN 23 03/06/2024 0852   BUN 14 01/31/2024  0848   CREATININE 1.53 (H) 03/06/2024 0852   CREATININE 1.21 (H) 08/11/2020 1400   CALCIUM  9.1 03/06/2024 0852   PROT 7.1 01/31/2024 0848   ALBUMIN 4.6 01/31/2024 0848   AST 22 01/31/2024 0848   AST 17 06/26/2010 1400   ALT 18 01/31/2024 0848   ALKPHOS 137 (H) 01/31/2024 0848   ALKPHOS 92 06/26/2010 1400   BILITOT 0.2 01/31/2024 0848   BILITOT 0.3 06/26/2010 1400   GFRNONAA 37 (L) 03/06/2024 0852   GFRNONAA 48 (L) 08/11/2020 1400   GFRAA 55 (L) 08/11/2020 1400   GFRAA 55 (L) 08/11/2020 1315     Diabetic Labs (most recent): Lab Results  Component Value Date   HGBA1C 6.2 04/09/2024   HGBA1C 6.2 10/11/2023   HGBA1C 5.8 04/05/2023     Lipid Panel ( most recent) Lipid Panel     Component Value Date/Time   CHOL 156 10/06/2023 0857   TRIG 75 10/06/2023 0857   HDL 68 10/06/2023 0857   CHOLHDL 2.3 10/06/2023 0857   CHOLHDL 3.0 08/10/2015 0621   VLDL 38 08/10/2015 0621   LDLCALC 74 10/06/2023 0857   LABVLDL 14 10/06/2023 0857      Lab Results  Component Value Date   TSH 2.160 10/06/2023   TSH 2.000 08/17/2023   TSH 1.310 08/16/2022   TSH 2.230 01/18/2020   TSH 2.048 10/26/2019   TSH 2.213 10/03/2013   TSH 1.312 04/27/2012   FREET4 1.21 10/06/2023   FREET4 1.03 01/18/2020   FREET4 1.41 (H) 10/26/2019   FREET4 1.20 04/27/2012      Assessment & Plan:   1. Type 2 diabetes mellitus with CKD   2. Essential hypertension, benign 3. Mixed hyperlipidemia  - I have reviewed her available endocrine records and clinically evaluated the patient. - Based on these reviews, she has well-controlled type 2 diabetes complicated by CKD.  She will not need additional intervention with medications, advised to continue Jardiance 10 mg p.o. daily at breakfast.    Side effects and precautions discussed with her.  This medication will afford her additional benefits in terms of her renal health.  In light of her CKD, she is advised to control blood pressure by taking her medications  consistently in the morning.   She is currently on amlodipine  5 mg p.o. daily, metoprolol  50 mg p.o. daily, olmesartan 40 mg p.o. daily. Her labs showed controlled LDL at 74.  She is advised to continue pravastatin 20 mg p.o. nightly.      - she is advised to maintain close follow up with Sheryle Carwin, MD for primary care needs.   I spent  25  minutes in the care of the patient today including review of labs from Thyroid  Function, CMP, and other relevant labs ; imaging/biopsy records (current and previous including abstractions from other facilities); face-to-face time discussing  her lab results and symptoms, medications doses, her options of short and long term treatment based on the latest standards of care / guidelines;   and documenting the encounter.  Doll H Mantey  participated in the discussions, expressed understanding, and voiced agreement with the above  plans.  All questions were answered to her satisfaction. she is encouraged to contact clinic should she have any questions or concerns prior to her return visit.   Follow up plan: Return in about 6 months (around 10/10/2024) for A1c -NV.   Ranny Earl, MD [facility] [address] [place], [place] [address] Phone: [phone]  Fax: [phone]     04/09/2024, 2:45 PM  This note was partially dictated with voice recognition software. Similar sounding words can be transcribed inadequately or may not  be corrected upon review.

## 2024-04-24 ENCOUNTER — Other Ambulatory Visit: Payer: Self-pay | Admitting: Gastroenterology

## 2024-04-26 ENCOUNTER — Ambulatory Visit (INDEPENDENT_AMBULATORY_CARE_PROVIDER_SITE_OTHER): Admitting: Gastroenterology

## 2024-04-26 VITALS — BP 138/87 | HR 71 | Temp 98.4°F | Ht 61.0 in | Wt 141.2 lb

## 2024-04-26 DIAGNOSIS — K642 Third degree hemorrhoids: Secondary | ICD-10-CM

## 2024-04-26 NOTE — Patient Instructions (Signed)

## 2024-04-26 NOTE — Progress Notes (Signed)
     [facility] BANDING PROCEDURE NOTE  Linda English is a 68 y.o. female presenting today for consideration of hemorrhoid banding. Last colonoscopy July 2025 with two 5-6 mm polyps in descending and ascending colon, internal hemorrhoids, diverticulosis. Benign. Repeat in 5 years.    The patient presents with symptomatic grade 3 hemorrhoids, unresponsive to maximal medical therapy, requesting rubber band ligation of her hemorrhoidal disease. All risks, benefits, and alternative forms of therapy were described and informed consent was obtained.  The decision was made to band the left lateral internal hemorrhoid initially, but she noted discomfort with this despite maneuvering of the ligator multiple times. I then elected to band neutrally, and the [facility] O'Regan System was used to perform band ligation. She noted pinching discomfort initially; I used DRE to manipulate the band and she had immediate relief.  Digital anorectal examination was then performed to assure proper positioning of the band, and to adjust the banded tissue as required. The patient was discharged home without pain or other issues. Dietary and behavioral recommendations were given, along with follow-up instructions. The patient will return in several weeks for followup and possible additional banding as required.  No complications were encountered and the patient tolerated the procedure well.   Therisa MICAEL Stager, PhD, ANP-BC [facility]

## 2024-06-14 ENCOUNTER — Ambulatory Visit (INDEPENDENT_AMBULATORY_CARE_PROVIDER_SITE_OTHER): Admitting: Gastroenterology

## 2024-06-14 ENCOUNTER — Encounter: Payer: Self-pay | Admitting: Gastroenterology

## 2024-06-14 VITALS — BP 139/83 | HR 74 | Temp 98.4°F | Ht 61.0 in | Wt 141.6 lb

## 2024-06-14 DIAGNOSIS — K642 Third degree hemorrhoids: Secondary | ICD-10-CM

## 2024-06-14 NOTE — Progress Notes (Signed)
    [facility] BANDING PROCEDURE NOTE  Linda English is a 68 y.o. female presenting today for consideration of hemorrhoid banding. Last colonoscopy July 2025 with two 5-6 mm polyps in descending and ascending colon, internal hemorrhoids, diverticulosis. Benign. Repeat in 5 years. She has had banding neutrally thus far on 04/26/24. Presents for further banding. She has had pressure, prolapsing, fecal soiling.      The patient presents with symptomatic grade 3 hemorrhoids, unresponsive to maximal medical therapy, requesting rubber band ligation of her hemorrhoidal disease. All risks, benefits, and alternative forms of therapy were described and informed consent was obtained.  In the left lateral decubitus position, anoscopic examination revealed grade 2-3 hemorrhoids in the right anterior and posterior position (s).  The decision was made to band the right posterior internal hemorrhoid, and the [facility] O'Regan System was used to perform band ligation without complication. Digital anorectal examination was then performed to assure proper positioning of the band, and to adjust the banded tissue as required. The patient was discharged home without pain or other issues. Dietary and behavioral recommendations were given, along with follow-up instructions. The patient will return in several weeks for followup and possible additional banding as required.  No complications were encountered and the patient tolerated the procedure well.   Therisa MICAEL Stager, PhD, ANP-BC [facility]

## 2024-06-14 NOTE — Patient Instructions (Signed)

## 2024-06-20 DIAGNOSIS — Z79899 Other long term (current) drug therapy: Secondary | ICD-10-CM | POA: Diagnosis not present

## 2024-06-20 DIAGNOSIS — N1832 Chronic kidney disease, stage 3b: Secondary | ICD-10-CM | POA: Diagnosis not present

## 2024-06-20 DIAGNOSIS — E1129 Type 2 diabetes mellitus with other diabetic kidney complication: Secondary | ICD-10-CM | POA: Diagnosis not present

## 2024-06-20 DIAGNOSIS — I10 Essential (primary) hypertension: Secondary | ICD-10-CM | POA: Diagnosis not present

## 2024-06-27 ENCOUNTER — Other Ambulatory Visit: Payer: Self-pay | Admitting: Gastroenterology

## 2024-06-27 DIAGNOSIS — I5032 Chronic diastolic (congestive) heart failure: Secondary | ICD-10-CM | POA: Diagnosis not present

## 2024-06-27 DIAGNOSIS — I10 Essential (primary) hypertension: Secondary | ICD-10-CM | POA: Diagnosis not present

## 2024-06-27 DIAGNOSIS — E1129 Type 2 diabetes mellitus with other diabetic kidney complication: Secondary | ICD-10-CM | POA: Diagnosis not present

## 2024-06-27 DIAGNOSIS — E785 Hyperlipidemia, unspecified: Secondary | ICD-10-CM | POA: Diagnosis not present

## 2024-06-27 DIAGNOSIS — N1832 Chronic kidney disease, stage 3b: Secondary | ICD-10-CM | POA: Diagnosis not present

## 2024-08-09 ENCOUNTER — Encounter: Admitting: Gastroenterology

## 2024-08-10 ENCOUNTER — Encounter: Payer: Self-pay | Admitting: Gastroenterology

## 2024-10-10 ENCOUNTER — Ambulatory Visit: Admitting: "Endocrinology

## 2024-10-31 ENCOUNTER — Ambulatory Visit: Admitting: Gastroenterology
